# Patient Record
Sex: Female | Born: 1950
Health system: Southern US, Community
[De-identification: ages and names within clinical notes are randomized; demographics above are authoritative.]

## PROBLEM LIST (undated history)

## (undated) DIAGNOSIS — G4730 Sleep apnea, unspecified: Secondary | ICD-10-CM

## (undated) DIAGNOSIS — I739 Peripheral vascular disease, unspecified: Secondary | ICD-10-CM

## (undated) DIAGNOSIS — I10 Essential (primary) hypertension: Secondary | ICD-10-CM

## (undated) DIAGNOSIS — E785 Hyperlipidemia, unspecified: Secondary | ICD-10-CM

## (undated) DIAGNOSIS — Z720 Tobacco use: Secondary | ICD-10-CM

## (undated) DIAGNOSIS — J449 Chronic obstructive pulmonary disease, unspecified: Secondary | ICD-10-CM

## (undated) DIAGNOSIS — I2510 Atherosclerotic heart disease of native coronary artery without angina pectoris: Secondary | ICD-10-CM

## (undated) DIAGNOSIS — J45909 Unspecified asthma, uncomplicated: Secondary | ICD-10-CM

## (undated) DIAGNOSIS — K219 Gastro-esophageal reflux disease without esophagitis: Secondary | ICD-10-CM

## (undated) DIAGNOSIS — T7840XA Allergy, unspecified, initial encounter: Secondary | ICD-10-CM

## (undated) DIAGNOSIS — E119 Type 2 diabetes mellitus without complications: Secondary | ICD-10-CM

## (undated) DIAGNOSIS — I5189 Other ill-defined heart diseases: Secondary | ICD-10-CM

## (undated) DIAGNOSIS — M1990 Unspecified osteoarthritis, unspecified site: Secondary | ICD-10-CM

## (undated) HISTORY — DX: Type 2 diabetes mellitus without complications: E11.9

## (undated) HISTORY — PX: CHOLECYSTECTOMY: SHX55

## (undated) HISTORY — DX: Tobacco use: Z72.0

## (undated) HISTORY — DX: Chronic obstructive pulmonary disease, unspecified: J44.9

## (undated) HISTORY — DX: Unspecified osteoarthritis, unspecified site: M19.90

## (undated) HISTORY — DX: Atherosclerotic heart disease of native coronary artery without angina pectoris: I25.10

## (undated) HISTORY — DX: Peripheral vascular disease, unspecified: I73.9

## (undated) HISTORY — DX: Hyperlipidemia, unspecified: E78.5

## (undated) HISTORY — DX: Sleep apnea, unspecified: G47.30

## (undated) HISTORY — DX: Other ill-defined heart diseases: I51.89

## (undated) HISTORY — DX: Allergy, unspecified, initial encounter: T78.40XA

## (undated) HISTORY — PX: CARDIAC CATHETERIZATION: SHX172

## (undated) HISTORY — DX: Essential (primary) hypertension: I10

## (undated) HISTORY — DX: Gastro-esophageal reflux disease without esophagitis: K21.9

## (undated) HISTORY — DX: Unspecified asthma, uncomplicated: J45.909

---

## 2004-03-10 ENCOUNTER — Other Ambulatory Visit: Payer: Self-pay

## 2004-05-03 ENCOUNTER — Other Ambulatory Visit: Payer: Self-pay

## 2004-11-30 ENCOUNTER — Ambulatory Visit: Payer: Self-pay | Admitting: General Practice

## 2005-02-18 ENCOUNTER — Ambulatory Visit: Payer: Self-pay | Admitting: Obstetrics and Gynecology

## 2005-06-01 ENCOUNTER — Emergency Department: Payer: Self-pay | Admitting: Emergency Medicine

## 2005-06-01 ENCOUNTER — Other Ambulatory Visit: Payer: Self-pay

## 2007-03-15 ENCOUNTER — Ambulatory Visit: Payer: Self-pay

## 2007-06-19 ENCOUNTER — Emergency Department: Payer: Self-pay | Admitting: Emergency Medicine

## 2007-10-08 ENCOUNTER — Emergency Department: Payer: Self-pay | Admitting: Emergency Medicine

## 2008-02-13 ENCOUNTER — Ambulatory Visit: Payer: Self-pay | Admitting: Gastroenterology

## 2008-04-13 ENCOUNTER — Emergency Department: Payer: Self-pay | Admitting: Emergency Medicine

## 2008-05-22 ENCOUNTER — Ambulatory Visit: Payer: Self-pay

## 2008-06-11 ENCOUNTER — Encounter: Payer: Self-pay | Admitting: General Practice

## 2008-06-26 ENCOUNTER — Encounter: Payer: Self-pay | Admitting: General Practice

## 2008-07-27 ENCOUNTER — Encounter: Payer: Self-pay | Admitting: General Practice

## 2008-08-18 ENCOUNTER — Ambulatory Visit: Payer: Self-pay | Admitting: General Practice

## 2008-10-09 ENCOUNTER — Ambulatory Visit: Payer: Self-pay | Admitting: Orthopedic Surgery

## 2008-10-28 ENCOUNTER — Ambulatory Visit: Payer: Self-pay | Admitting: Orthopedic Surgery

## 2008-12-03 ENCOUNTER — Encounter: Payer: Self-pay | Admitting: Orthopedic Surgery

## 2008-12-18 ENCOUNTER — Ambulatory Visit: Payer: Self-pay

## 2008-12-25 ENCOUNTER — Encounter: Payer: Self-pay | Admitting: Orthopedic Surgery

## 2009-02-11 ENCOUNTER — Encounter: Payer: Self-pay | Admitting: Internal Medicine

## 2009-02-11 ENCOUNTER — Ambulatory Visit: Payer: Self-pay

## 2009-04-09 ENCOUNTER — Ambulatory Visit: Payer: Self-pay | Admitting: General Practice

## 2009-04-17 ENCOUNTER — Encounter: Payer: Self-pay | Admitting: General Practice

## 2009-04-25 ENCOUNTER — Inpatient Hospital Stay: Payer: Self-pay | Admitting: Internal Medicine

## 2009-04-26 ENCOUNTER — Encounter: Payer: Self-pay | Admitting: General Practice

## 2009-05-21 ENCOUNTER — Ambulatory Visit: Payer: Self-pay | Admitting: General Practice

## 2009-05-27 ENCOUNTER — Encounter: Payer: Self-pay | Admitting: General Practice

## 2009-06-08 ENCOUNTER — Ambulatory Visit: Payer: Self-pay

## 2009-06-26 ENCOUNTER — Encounter: Payer: Self-pay | Admitting: General Practice

## 2010-07-14 ENCOUNTER — Observation Stay: Payer: Self-pay | Admitting: Internal Medicine

## 2010-08-11 ENCOUNTER — Ambulatory Visit: Payer: Self-pay | Admitting: Family Medicine

## 2010-10-20 ENCOUNTER — Ambulatory Visit: Payer: Self-pay | Admitting: Gastroenterology

## 2010-12-15 ENCOUNTER — Emergency Department: Payer: Self-pay | Admitting: Internal Medicine

## 2011-01-25 ENCOUNTER — Encounter: Payer: Self-pay | Admitting: Physician Assistant

## 2011-01-31 ENCOUNTER — Emergency Department: Payer: Self-pay | Admitting: Internal Medicine

## 2011-02-25 ENCOUNTER — Encounter: Payer: Self-pay | Admitting: Physician Assistant

## 2011-03-14 ENCOUNTER — Ambulatory Visit: Payer: Self-pay | Admitting: Unknown Physician Specialty

## 2011-03-27 ENCOUNTER — Encounter: Payer: Self-pay | Admitting: Physician Assistant

## 2011-04-22 ENCOUNTER — Ambulatory Visit: Payer: Self-pay | Admitting: General Practice

## 2011-05-03 ENCOUNTER — Other Ambulatory Visit: Payer: Self-pay

## 2011-05-23 ENCOUNTER — Ambulatory Visit: Payer: Self-pay | Admitting: Physician Assistant

## 2011-09-07 ENCOUNTER — Ambulatory Visit: Payer: Self-pay | Admitting: General Practice

## 2012-09-13 ENCOUNTER — Ambulatory Visit: Payer: Self-pay | Admitting: Family Medicine

## 2013-01-05 ENCOUNTER — Emergency Department: Payer: Self-pay | Admitting: Emergency Medicine

## 2013-09-16 ENCOUNTER — Ambulatory Visit: Payer: Self-pay | Admitting: Family Medicine

## 2014-03-15 ENCOUNTER — Emergency Department: Payer: Self-pay | Admitting: Emergency Medicine

## 2014-03-15 LAB — BASIC METABOLIC PANEL
Anion Gap: 7 (ref 7–16)
BUN: 11 mg/dL (ref 7–18)
CALCIUM: 8.9 mg/dL (ref 8.5–10.1)
Chloride: 101 mmol/L (ref 98–107)
Co2: 30 mmol/L (ref 21–32)
Creatinine: 1.01 mg/dL (ref 0.60–1.30)
EGFR (Non-African Amer.): >60
Glucose: 137 mg/dL — ABNORMAL HIGH (ref 65–99)
OSMOLALITY: 277 (ref 275–301)
Potassium: 3.6 mmol/L (ref 3.5–5.1)
SODIUM: 138 mmol/L (ref 136–145)

## 2014-03-15 LAB — CBC
HCT: 43.4 % (ref 35.0–47.0)
HGB: 14.5 g/dL (ref 12.0–16.0)
MCH: 30.7 pg (ref 26.0–34.0)
MCHC: 33.5 g/dL (ref 32.0–36.0)
MCV: 92 fL (ref 80–100)
Platelet: 291 10*3/uL (ref 150–440)
RBC: 4.73 10*6/uL (ref 3.80–5.20)
RDW: 13.3 % (ref 11.5–14.5)
WBC: 10.8 10*3/uL (ref 3.6–11.0)

## 2014-03-15 LAB — TROPONIN I

## 2014-04-21 ENCOUNTER — Ambulatory Visit: Payer: Self-pay

## 2014-04-26 ENCOUNTER — Ambulatory Visit: Payer: Self-pay

## 2014-05-27 ENCOUNTER — Ambulatory Visit: Payer: Self-pay

## 2014-06-26 ENCOUNTER — Ambulatory Visit: Payer: Self-pay

## 2014-10-09 ENCOUNTER — Ambulatory Visit: Payer: Self-pay | Admitting: Family Medicine

## 2014-12-15 ENCOUNTER — Other Ambulatory Visit: Payer: Self-pay

## 2014-12-15 NOTE — Patient Outreach (Signed)
Patient in- saw MD earlier this month- A1C slightly higher than last A1C but MD pleased with report of 6.7%  The patient has decided to quit smoking- she contemplated going to a hypnotist but decided on Chantix instead- her MD has given her a prescription and she is going to set a date with her daughter and son, both who live with her and both who smoke and want to quit.  Orilla states she does not eat all meals and she tells me she needs to limit sweets.  She is not willing to increase her activity because she said she walks so much at work.

## 2014-12-17 NOTE — Patient Outreach (Signed)
   12/17/2014   Rodolph Bong 10-25-1950 [id number]    Current Medications: Current Outpatient Prescriptions  Medication Sig Dispense Refill  . albuterol (PROVENTIL HFA;VENTOLIN HFA) 108 (90 BASE) MCG/ACT inhaler Inhale 1 puff into the lungs every 6 (six) hours as needed.    Marland Kitchen aspirin 81 MG tablet Take 81 mg by mouth daily. 1 tablet daily    . atenolol (TENORMIN) 25 MG tablet Take 25 mg by mouth daily. 1 tablet once a day    . atorvastatin (LIPITOR) 40 MG tablet Take 40 mg by mouth daily. 1 tab per day    . fluticasone (FLONASE) 50 MCG/ACT nasal spray Place 1 spray into both nostrils daily. As needed    . Fluticasone-Salmeterol (ADVAIR) 250-50 MCG/DOSE AEPB Inhale 1 puff into the lungs 2 (two) times daily.    Marland Kitchen lisinopril-hydrochlorothiazide (PRINZIDE,ZESTORETIC) 20-12.5 MG per tablet Take 1 tablet by mouth daily.    . metFORMIN (GLUCOPHAGE) 500 MG tablet Take 500 mg by mouth 2 (two) times daily with a meal.    . naproxen (NAPROSYN) 500 MG tablet Take 500 mg by mouth 2 (two) times daily with a meal. As needed    . omeprazole (PRILOSEC) 20 MG capsule Take 20 mg by mouth daily.     No current facility-administered medications for this visit.    Functional Status: No flowsheet data found.  Fall/Depression Screening: PHQ 2/9 Scores 12/15/2014  PHQ - 2 Score 0    Assessment:  Patient in- saw MD earlier this month- A1C slightly higher than last A1C but MD pleased with report of 6.7%  The patient has decided to quit smoking- she contemplated going to a hypnotist but decided on Chantix instead- her MD has given her a prescription and she is going to set a date with her daughter and son, both who live with her and both who smoke and want to quit.  Bert states she does not eat all meals and she tells me she needs to limit sweets. She is not willing to increase her activity because she said she walks so much at work.   I reviewed the meaning of the A1C with the patient using a pictorial  explanation.   Plan: Patient has her prescription for Chantix- will set a quit date with her children who are both going to quit at the same time.

## 2014-12-17 NOTE — Patient Instructions (Addendum)
Hemoglobin A1c Test The hemoglobin (HbA1c) test measures the average amount of sugar (glucose) in your blood during the 2-3 months just before the test (rather than measuring the current amount of glucose in the sample of blood). Some of the glucose that circulates in your blood binds to blood proteins. Hemoglobin (Hb) is a blood protein that carries oxygen in the red blood cells (RBCs). When glucose binds to Hb, the glucose-coated Hb is called glycated Hb. Once Hb is glycated, it remains that way for the life of the RBC (about 120 days). The HbA1c test is used to diagnose diabetes mellitus and to monitor long-term control of blood sugar in people who have diabetes mellitus. The HbA1c test can be used in place of or in combination with fasting blood glucose level and oral glucose tolerance tests. There are several methods for measuring the concentration of HbA1c in the blood. One method uses antibodies that bind specifically to HbA1c. A lab instrument then measures the concentration of bound antibodies in a sample. A second method called ion exchange high-pressure liquid chromatography separates HbA1c from other types of Hb on the basis of differences in electrical charge. A third method for measuring HbA1c uses enzymes that react specifically with the glucose on HbA1c to produce a measurable color change. RESULTS  It is your responsibility to obtain your test results. Ask the lab or department performing the test when and how you will get your results. Contact your health care provider to discuss any questions you have about your results.  Range of Normal Values Ranges for normal values may vary among different labs and hospitals. You should always check with your health care provider after having lab work or other tests done to discuss the meaning of your test results and whether your values are considered within normal limits. The range for normal HbA1c test results is from 4.5% to less than 5.7%. Meaning  of Results Outside Normal Value Ranges Abnormally high HbA1c values are most commonly an indication of prediabetes mellitus and diabetes mellitus:  An HbA1c result of 5.7-6.4% is considered diagnostic of prediabetes mellitus.  An HbA1c result of 6.5% or higher on two separate occasions is considered diagnostic of diabetes mellitus. There are certain conditions that can cause a falsely low HbA1c test result. These conditions include pregnancy, blood loss, blood transfusions, anemia caused by premature destruction of red blood cells (hemolytic anemia), and certain unusual forms of Hb (Hb variants), such as sickle cell trait. There are certain conditions that can cause a falsely low HbA1c test result. These conditions include iron deficiency, kidney failure, and certain Hb variants. Document Released: 10/04/2004 Document Revised: 01/27/2014 Document Reviewed: 08/09/2012 [facility] Patient Information 2015 [place], [place]. This information is not intended to replace advice given to you by your health care provider. Make sure you discuss any questions you have with your health care provider. Blood Glucose Monitoring Monitoring your blood glucose (also know as blood sugar) helps you to manage your diabetes. It also helps you and your health care provider monitor your diabetes and determine how well your treatment plan is working. WHY SHOULD YOU MONITOR YOUR BLOOD GLUCOSE?  It can help you understand how food, exercise, and medicine affect your blood glucose.  It allows you to know what your blood glucose is at any given moment. You can quickly tell if you are having low blood glucose (hypoglycemia) or high blood glucose (hyperglycemia).  It can help you and your health care provider know how to adjust your  medicines.  It can help you understand how to manage an illness or adjust medicine for exercise. WHEN SHOULD YOU TEST? Your health care provider will help you decide how often you should check your  blood glucose. This may depend on the type of diabetes you have, your diabetes control, or the types of medicines you are taking. Be sure to write down all of your blood glucose readings so that this information can be reviewed with your health care provider. See below for examples of testing times that your health care provider may suggest. Type 1 Diabetes  Test 4 times a day if you are in good control, using an insulin pump, or perform multiple daily injections.  If your diabetes is not well controlled or if you are sick, you may need to monitor more often.  It is a good idea to also monitor:  Before and after exercise.  Between meals and 2 hours after a meal.  Occasionally between 2:00 a.m. and 3:00 a.m. Type 2 Diabetes  It can vary with each person, but generally, if you are on insulin, test 4 times a day.  If you take medicines by mouth (orally), test 2 times a day.  If you are on a controlled diet, test once a day.  If your diabetes is not well controlled or if you are sick, you may need to monitor more often. HOW TO MONITOR YOUR BLOOD GLUCOSE Supplies Needed  Blood glucose meter.  Test strips for your meter. Each meter has its own strips. You must use the strips that go with your own meter.  A pricking needle (lancet).  A device that holds the lancet (lancing device).  A journal or log book to write down your results. Procedure  Wash your hands with soap and water. Alcohol is not preferred.  Prick the side of your finger (not the tip) with the lancet.  Gently milk the finger until a small drop of blood appears.  Follow the instructions that come with your meter for inserting the test strip, applying blood to the strip, and using your blood glucose meter. Other Areas to Get Blood for Testing Some meters allow you to use other areas of your body (other than your finger) to test your blood. These areas are called alternative sites. The most common alternative sites  are:  The forearm.  The thigh.  The back area of the lower leg.  The palm of the hand. The blood flow in these areas is slower. Therefore, the blood glucose values you get may be delayed, and the numbers are different from what you would get from your fingers. Do not use alternative sites if you think you are having hypoglycemia. Your reading will not be accurate. Always use a finger if you are having hypoglycemia. Also, if you cannot feel your lows (hypoglycemia unawareness), always use your fingers for your blood glucose checks. ADDITIONAL TIPS FOR GLUCOSE MONITORING  Do not reuse lancets.  Always carry your supplies with you.  All blood glucose meters have a 24-hour "hotline" number to call if you have questions or need help.  Adjust (calibrate) your blood glucose meter with a control solution after finishing a few boxes of strips. BLOOD GLUCOSE RECORD KEEPING It is a good idea to keep a daily record or log of your blood glucose readings. Most glucose meters, if not all, keep your glucose records stored in the meter. Some meters come with the ability to download your records to your home computer. Keeping  a record of your blood glucose readings is especially helpful if you are wanting to look for patterns. Make notes to go along with the blood glucose readings because you might forget what happened at that exact time. Keeping good records helps you and your health care provider to work together to achieve good diabetes management.  Document Released: 09/15/2003 Document Revised: 01/27/2014 Document Reviewed: 02/04/2013 [facility] Patient Information 2015 [place], [place]. This information is not intended to replace advice given to you by your health care provider. Make sure you discuss any questions you have with your health care provider.

## 2014-12-29 ENCOUNTER — Ambulatory Visit: Payer: Self-pay

## 2015-04-01 ENCOUNTER — Other Ambulatory Visit: Payer: Self-pay

## 2015-04-01 NOTE — Patient Outreach (Signed)
[facility]) Care Management  04/01/2015  Monica Oliver January 16, 1951 [id number]   Left a voice message requesting the patient schedule an appointment.   Gentry Fitz, RN, BA, [place], [place] Direct Dial:  [phone]  Fax:  [phone] E-mail: [email or web address] [address], [place], [place]  [address]

## 2015-05-04 ENCOUNTER — Other Ambulatory Visit: Payer: Self-pay

## 2015-05-04 VITALS — BP 130/66 | Ht 64.0 in | Wt 183.6 lb

## 2015-05-04 DIAGNOSIS — E119 Type 2 diabetes mellitus without complications: Secondary | ICD-10-CM

## 2015-05-04 NOTE — Patient Outreach (Signed)
[facility]) Care Management  [place]  05/04/2015   Monica Oliver 1951/03/10 [id number]  Subjective: Patient in for regular Link to Wellness visit.  She happily reports that she has been checking blood sugars more often and she has her meter with her.  Sugars 103-132mg /dl fasting and 105-168mg /dl 2 hour post prandial.  She has lost 6 lbs since I last saw her but she's had an upper respiratory infection that required a nebulizer, steroids and an antibiotic.  She said she lost weight because she lost her appetite.  Complains of no pain currently but that sometimes her great toes on both feet will hurt because of the nails. She verbalizes that she does not exercise because she walks all day at work.   Objective:  Sugars 103-132mg /dl fasting and 105-168mg /dl 2 hour post prandial.   Filed Vitals:   05/04/15 1114  BP: 130/66   On examination- Both great toenails are thick and growing into the skin.  Neither is red or swollen. No complaint of pain at this time.  No abnormal findings on the sole of the feet.   Did not do a point of care A1C at this visit since she recently had steroids- defer to MD visit.   Current Medications:  Current Outpatient Prescriptions  Medication Sig Dispense Refill  . albuterol (PROVENTIL HFA;VENTOLIN HFA) 108 (90 BASE) MCG/ACT inhaler Inhale 1 puff into the lungs every 6 (six) hours as needed.    Marland Kitchen aspirin 81 MG tablet Take 81 mg by mouth daily. 1 tablet daily    . atenolol (TENORMIN) 25 MG tablet Take 25 mg by mouth daily. 1 tablet once a day    . atorvastatin (LIPITOR) 40 MG tablet Take 40 mg by mouth daily. 1 tab per day    . fluticasone (FLONASE) 50 MCG/ACT nasal spray Place 1 spray into both nostrils daily. As needed    . Fluticasone-Salmeterol (ADVAIR) 250-50 MCG/DOSE AEPB Inhale 1 puff into the lungs 2 (two) times daily.    Marland Kitchen lisinopril-hydrochlorothiazide (PRINZIDE,ZESTORETIC) 20-12.5 MG per tablet Take 1 tablet by mouth daily.    .  metFORMIN (GLUCOPHAGE) 500 MG tablet Take 500 mg by mouth daily.     . naproxen (NAPROSYN) 500 MG tablet Take 500 mg by mouth 2 (two) times daily with a meal. As needed    . omeprazole (PRILOSEC) 20 MG capsule Take 20 mg by mouth daily. prn     No current facility-administered medications for this visit.    Functional Status:  In your present state of health, do you have any difficulty performing the following activities: 05/04/2015  Hearing? N  Vision? N  Difficulty concentrating or making decisions? N  Walking or climbing stairs? N  Dressing or bathing? N  Doing errands, shopping? N  Preparing Food and eating ? N  Using the Toilet? N  In the past six months, have you accidently leaked urine? N  Do you have problems with loss of bowel control? N  Managing your Medications? N  Managing your Finances? N  Housekeeping or managing your Housekeeping? N    Fall/Depression Screening: PHQ 2/9 Scores 12/15/2014  PHQ - 2 Score 0    Assessment: Patient continues to smoke despite having Chantix at home- she verbalizes that she wants to but is not ready.  Both her son and daughter live at home and smoke.   She is missing numerous preventative care visits that I have strongly encouraged her to complete; PAP, dental/oral cancer check,  dilated eye, podiatrist, MD visit, A1C.  She is doing a much better job checking her blood sugars than she has in the past.   Plan:  Schedule MD visit to have PAP and A1C Schedule with foot doctor to discuss best options for toe nails.  Continue checking sugars 1-2 times per day.   Gentry Fitz, RN, BA, [place], [place] Direct Dial:  [phone]  Fax:  [phone] E-mail: [email or web address] [address], [place], [place]  [address]

## 2015-05-14 ENCOUNTER — Ambulatory Visit
Admission: RE | Admit: 2015-05-14 | Discharge: 2015-05-14 | Disposition: A | Discharge status: Home / Self Care | Payer: 59 | Source: Ambulatory Visit | Attending: Physician Assistant | Admitting: Physician Assistant

## 2015-05-14 ENCOUNTER — Other Ambulatory Visit: Payer: Self-pay | Admitting: Physician Assistant

## 2015-05-14 DIAGNOSIS — R05 Cough: Secondary | ICD-10-CM | POA: Diagnosis present

## 2015-05-14 DIAGNOSIS — R059 Cough, unspecified: Secondary | ICD-10-CM

## 2015-08-10 ENCOUNTER — Other Ambulatory Visit: Payer: Self-pay

## 2015-08-10 VITALS — BP 128/74 | Ht 64.0 in | Wt 186.0 lb

## 2015-08-10 DIAGNOSIS — E119 Type 2 diabetes mellitus without complications: Secondary | ICD-10-CM

## 2015-08-10 NOTE — Patient Outreach (Addendum)
[facility]) Care Management  [place]  08/10/2015   TIKI BACHRACH 09/14/1951 [id number]  Subjective: Monica Oliver comes today for her regularly scheduled Link to Wellness visit. She complains of being tired but has worked the weekend.  She has no other complaints.  She has seen her family doctor.  She had one episode of asthma exacerbation in August and required steroids but blood sugars recently are ideal; 115mg /dl for 7 days, 123mg /dl for 14 days and 122mg /dl for 30 days- she reports taking her sugars twice a day.  She takes BP and cholesterol medications on a regular basis.  She had been ordered Symbicort and was taking it prn- she has clarified with the pharmacy and should be taking 2 puffs twice a day.  Objective:  Filed Vitals:   08/10/15 0956  BP: 128/74  Height: 1.626 m (5\' 4" )  Weight: 186 lb (84.369 kg)   Review of her blood sugar meter; fasting blood sugars 115-147mg /dl, 2 hour post partum 110-158mg /dl. Reports A1C of 6.6%  Current Medications:  Current Outpatient Prescriptions  Medication Sig Dispense Refill  . albuterol (PROVENTIL HFA;VENTOLIN HFA) 108 (90 BASE) MCG/ACT inhaler Inhale 1 puff into the lungs every 6 (six) hours as needed.    Marland Kitchen aspirin 81 MG tablet Take 81 mg by mouth daily. 1 tablet daily    . atenolol (TENORMIN) 25 MG tablet Take 25 mg by mouth daily. 1 tablet once a day    . atorvastatin (LIPITOR) 40 MG tablet Take 40 mg by mouth daily. 1 tab per day    . budesonide-formoterol (SYMBICORT) 160-4.5 MCG/ACT inhaler Inhale into the lungs 2 (two) times daily.     . fluticasone (FLONASE) 50 MCG/ACT nasal spray Place 1 spray into both nostrils daily. As needed    . lisinopril-hydrochlorothiazide (PRINZIDE,ZESTORETIC) 20-12.5 MG per tablet Take 1 tablet by mouth daily.    . metFORMIN (GLUCOPHAGE) 500 MG tablet Take 500 mg by mouth daily.     . naproxen (NAPROSYN) 500 MG tablet Take 500 mg by mouth 2 (two) times daily with a meal. As needed     . omeprazole (PRILOSEC) 20 MG capsule Take 20 mg by mouth daily. prn    . Fluticasone-Salmeterol (ADVAIR) 250-50 MCG/DOSE AEPB Inhale 1 puff into the lungs 2 (two) times daily.     No current facility-administered medications for this visit.    Functional Status:  In your present state of health, do you have any difficulty performing the following activities: 08/10/2015 05/04/2015  Hearing? N N  Vision? N N  Difficulty concentrating or making decisions? N N  Walking or climbing stairs? N N  Dressing or bathing? N N  Doing errands, shopping? N N  Preparing Food and eating ? - N  Using the Toilet? - N  In the past six months, have you accidently leaked urine? - N  Do you have problems with loss of bowel control? - N  Managing your Medications? - N  Managing your Finances? - N  Housekeeping or managing your Housekeeping? - N    Fall/Depression Screening: PHQ 2/9 Scores 08/10/2015 12/15/2014  PHQ - 2 Score 0 0    Assessment: Aquita is taking an active part in managing her diabetes by checking her blood sugars and taking her meds as ordered.  She is still smoking and does not exercise.   Plan: [place] will continue to check sugars twice a day.  She has thick toe nails which requires her to wear soft toed  shoes- we discussed her seeing a foot doctor which she has not done- it might be beneficial.  She is going to enquire with the [place] and employee health on getting a pneumonia and shingles vaccine.  She had her flu shot in October, 2016.  [facility] CM Care Plan Problem One        Most Recent Value   Care Plan Problem One  Current Smoker   Role Documenting the Problem One  Care Management Coordinator   Care Plan for Problem One  Not Active [patient has not started using Chantix and is not ready to quit smoking.  she verbalizes a desire to quit but is not ready. ]    [facility] CM Care Plan Problem Two        Most Recent Value   Care Plan Problem Two  Potential for long term complications     Role Documenting the Problem Two  Care Management [place] for Problem Two  Active   Interventions for Problem Two Long Term Goal   Ask MD and Employee health about Shingles and Pneumonia vaccine   [facility] Long Term Goal (31-90) days  Patient will understand the immunizations she needs to stay healthy   [facility] Long Term Goal Start Date  08/10/15       Follow up with me in 3 months- she has a follow appointment with her doctor in January, 2017.    Gentry Fitz, RN, BA, [place], [place] Direct Dial:  [phone]  Fax:  [phone] E-mail: [email or web address] [address] [place], [place]  [address]

## 2015-09-14 ENCOUNTER — Ambulatory Visit: Payer: Self-pay | Admitting: Physician Assistant

## 2015-09-14 ENCOUNTER — Encounter: Payer: Self-pay | Admitting: Physician Assistant

## 2015-09-14 VITALS — BP 120/82 | HR 80 | Temp 98.5°F

## 2015-09-14 DIAGNOSIS — J209 Acute bronchitis, unspecified: Secondary | ICD-10-CM

## 2015-09-14 MED ORDER — HYDROCOD POLST-CPM POLST ER 10-8 MG/5ML PO SUER: 5.0000 mL | Freq: Two times a day (BID) | ORAL | Status: DC | PRN

## 2015-09-14 MED ORDER — CEFDINIR 300 MG PO CAPS: 300.0000 mg | ORAL_CAPSULE | Freq: Two times a day (BID) | ORAL | Status: DC

## 2015-09-14 MED ORDER — FLUCONAZOLE 150 MG PO TABS: 150.0000 mg | ORAL_TABLET | Freq: Once | ORAL | Status: DC

## 2015-09-14 MED ORDER — PREDNISONE 10 MG (21) PO TBPK: 10.0000 mg | ORAL_TABLET | Freq: Every day | ORAL | Status: DC

## 2015-09-14 NOTE — Progress Notes (Signed)
S: C/o runny nose and congestion for 3 days, cough is harsh, keeping her awake at night, chest feels tight,  no fever, chills, cp/sob, v/d; mucus was brown + smoker Using otc meds: robitussin  O: PE: vitals wnl, nad, perrl eomi, normocephalic, tms dull, nasal mucosa red and swollen, throat injected, neck supple no lymph, lungs c t a, cv rrr, neuro intact  A:  Acute bronchitis, uri   P: omnicef 300mg  bid x 10d, sterapred ds 6d dose pack, diflucan, tussionex, 128ml nr drink fluids, continue regular meds , use otc meds of choice, return if not improving in 5 days, return earlier if worsening

## 2015-09-29 ENCOUNTER — Ambulatory Visit: Payer: Self-pay | Admitting: Physician Assistant

## 2015-09-29 ENCOUNTER — Encounter: Payer: Self-pay | Admitting: Physician Assistant

## 2015-09-29 VITALS — BP 120/74 | HR 86 | Temp 98.5°F

## 2015-09-29 DIAGNOSIS — J209 Acute bronchitis, unspecified: Secondary | ICD-10-CM

## 2015-09-29 DIAGNOSIS — R300 Dysuria: Secondary | ICD-10-CM

## 2015-09-29 LAB — POCT URINALYSIS DIPSTICK
Bilirubin, UA: NEGATIVE
Blood, UA: NEGATIVE
Glucose, UA: NEGATIVE
Ketones, UA: NEGATIVE
LEUKOCYTES UA: NEGATIVE
Nitrite, UA: NEGATIVE
PH UA: 7
PROTEIN UA: NEGATIVE
SPEC GRAV UA: 1.025
UROBILINOGEN UA: 0.2

## 2015-09-29 MED ORDER — METHYLPREDNISOLONE 4 MG PO TBPK: ORAL_TABLET | ORAL | Status: DC

## 2015-09-29 MED ORDER — LEVOFLOXACIN 500 MG PO TABS: 500.0000 mg | ORAL_TABLET | Freq: Every day | ORAL | Status: DC

## 2015-09-29 NOTE — Progress Notes (Signed)
S: continued cough and congestion, no known fever but is having a lot of sweats and cold chills, some sob but is better since using nebulizer, no swelling in feet/ankles, doesn't think she is urinating as much, ?if dehydrated  O: vitals wnl, nad, pt appears to not feel well, lungs c t a, cv rrr, cough is congested, skin has good turgor, ua wnl, no ketones noted  A: uri  P: levaquin, medrol dose pack, return if worsening

## 2015-11-02 ENCOUNTER — Other Ambulatory Visit: Payer: Self-pay

## 2015-11-02 VITALS — BP 150/90 | Ht 64.0 in | Wt 182.5 lb

## 2015-11-02 DIAGNOSIS — E119 Type 2 diabetes mellitus without complications: Secondary | ICD-10-CM

## 2015-11-02 NOTE — Patient Outreach (Signed)
[place] [facility]) Care Management  [place]  11/02/2015   Monica Oliver March 10, 1951 [id number]  Subjective: Monica Oliver visited today for her [facility] [place] appointment. Despite goals set last visit, she continues only to check blood sugars first thing in the morning- none after meals.  She is not exercising and although she tried to use Chantix to quit smoking, the medication made her nauseous and she quit using it.   She denies pain or depression and is taking meds as ordered.  She reports that her toe nails feel better- she's keeping them short.  She did not see a foot doctor for an assessment of her toe nails- she using Vicks Vapor Rub on her toenails. She had a sinus infection since the last time I saw her - she took prednisone and 2 rounds of antibiotics.   Objective:  Filed Vitals:   11/02/15 0912  BP: 150/90  Height: 1.626 m (5\' 4" )  Weight: 182 lb 8 oz (82.781 kg)     Current Medications:  Current Outpatient Prescriptions  Medication Sig Dispense Refill  . albuterol (PROVENTIL HFA;VENTOLIN HFA) 108 (90 BASE) MCG/ACT inhaler Inhale 1 puff into the lungs every 6 (six) hours as needed.    Marland Kitchen aspirin 81 MG tablet Take 81 mg by mouth daily. 1 tablet daily    . atenolol (TENORMIN) 25 MG tablet Take 25 mg by mouth daily. 1 tablet once a day    . atorvastatin (LIPITOR) 40 MG tablet Take 40 mg by mouth daily. 1 tab per day    . budesonide-formoterol (SYMBICORT) 160-4.5 MCG/ACT inhaler Inhale into the lungs 2 (two) times daily.     . chlorpheniramine-HYDROcodone (TUSSIONEX PENNKINETIC ER) 10-8 MG/5ML SUER Take 5 mLs by mouth every 12 (twelve) hours as needed for cough. 150 mL 0  . fluticasone (FLONASE) 50 MCG/ACT nasal spray Place 1 spray into both nostrils daily. As needed    . lisinopril-hydrochlorothiazide (PRINZIDE,ZESTORETIC) 20-12.5 MG per tablet Take 1 tablet by mouth daily.    . metFORMIN (GLUCOPHAGE) 500 MG tablet Take 500 mg by mouth daily.     .  naproxen (NAPROSYN) 500 MG tablet Take 500 mg by mouth 2 (two) times daily with a meal. As needed    . omeprazole (PRILOSEC) 20 MG capsule Take 20 mg by mouth daily. prn    . cefdinir (OMNICEF) 300 MG capsule Take 1 capsule (300 mg total) by mouth 2 (two) times daily. (Patient not taking: Reported on 09/29/2015) 20 capsule 0  . fluconazole (DIFLUCAN) 150 MG tablet Take 1 tablet (150 mg total) by mouth once. (Patient not taking: Reported on 09/29/2015) 1 tablet 0  . Fluticasone-Salmeterol (ADVAIR) 250-50 MCG/DOSE AEPB Inhale 1 puff into the lungs 2 (two) times daily. Reported on 11/02/2015    . levofloxacin (LEVAQUIN) 500 MG tablet Take 1 tablet (500 mg total) by mouth daily. (Patient not taking: Reported on 11/02/2015) 7 tablet 0  . methylPREDNISolone (MEDROL DOSEPAK) 4 MG TBPK tablet Take 6 pills on day one then decrease by 1 pill each day (Patient not taking: Reported on 11/02/2015) 21 tablet 0  . predniSONE (STERAPRED UNI-PAK 21 TAB) 10 MG (21) TBPK tablet Take 1 tablet (10 mg total) by mouth daily. Take 6 pills on day one then decrease by 1 pill each day (Patient not taking: Reported on 09/29/2015) 21 tablet 0   No current facility-administered medications for this visit.    Functional Status:  In your present state of health, do you  have any difficulty performing the following activities: 11/02/2015 08/10/2015  Hearing? N N  Vision? N N  Difficulty concentrating or making decisions? N N  Walking or climbing stairs? N N  Dressing or bathing? N N  Doing errands, shopping? N N  Preparing Food and eating ? - -  Using the Toilet? - -  In the past six months, have you accidently leaked urine? - -  Do you have problems with loss of bowel control? - -  Managing your Medications? - -  Managing your Finances? - -  Housekeeping or managing your Housekeeping? - -    Fall/Depression Screening: PHQ 2/9 Scores 11/02/2015 08/10/2015 12/15/2014  PHQ - 2 Score 0 0 0    Assessment: Monica Oliver expressed a desire to  stop smoking.  This morning's elevated BP may have been related to a cigarette this morning before our appointment. We spoke to the pharmacist and she received Nicorette lozenges to help her quit smoking. She tells me she beats herself up everyday about smoking- her daughter lives with her and smokes too- it will be difficult for Monica Oliver to quit if Monica Oliver is still smoking.   We discussed HCPOA and I have encouraged her to complete the paperwork- she is legally still married and her "husband" lives in [place]. Monica Oliver needs some preventative health maintenance- dilated eye, A1C, and possibly the shingles and pneumonia vaccine- I have asked her to complete these before February 25, 2016 at which time I will follow up with her.    She is not interested in setting exercise goals.   Plan:  [facility] CM Care Plan Problem Two        Most Recent Value   Interventions for Problem Two Long Term Goal   Get a dilated eye exam by February 25, 2016,  Check BP everyday until you see MD- write it down and bring it to him      Gentry Fitz, RN, BA, [place], [place]:  [phone]  Fax:  [phone] E-mail: [email or web address] [address], [place], [place]  [address]

## 2015-12-14 DIAGNOSIS — F17210 Nicotine dependence, cigarettes, uncomplicated: Secondary | ICD-10-CM | POA: Diagnosis not present

## 2015-12-14 DIAGNOSIS — E119 Type 2 diabetes mellitus without complications: Secondary | ICD-10-CM | POA: Diagnosis not present

## 2015-12-14 DIAGNOSIS — R634 Abnormal weight loss: Secondary | ICD-10-CM | POA: Diagnosis not present

## 2015-12-14 DIAGNOSIS — R5383 Other fatigue: Secondary | ICD-10-CM | POA: Diagnosis not present

## 2015-12-14 DIAGNOSIS — J449 Chronic obstructive pulmonary disease, unspecified: Secondary | ICD-10-CM | POA: Diagnosis not present

## 2015-12-14 DIAGNOSIS — R3915 Urgency of urination: Secondary | ICD-10-CM | POA: Diagnosis not present

## 2015-12-14 DIAGNOSIS — G4730 Sleep apnea, unspecified: Secondary | ICD-10-CM | POA: Diagnosis not present

## 2015-12-14 DIAGNOSIS — E559 Vitamin D deficiency, unspecified: Secondary | ICD-10-CM | POA: Diagnosis not present

## 2015-12-14 DIAGNOSIS — R7309 Other abnormal glucose: Secondary | ICD-10-CM | POA: Diagnosis not present

## 2015-12-14 DIAGNOSIS — I10 Essential (primary) hypertension: Secondary | ICD-10-CM | POA: Diagnosis not present

## 2015-12-22 ENCOUNTER — Encounter: Payer: Self-pay | Admitting: Physician Assistant

## 2015-12-22 ENCOUNTER — Ambulatory Visit: Payer: Self-pay | Admitting: Physician Assistant

## 2015-12-22 VITALS — Temp 98.6°F

## 2015-12-22 LAB — POCT INFLUENZA A/B
INFLUENZA A, POC: NEGATIVE
INFLUENZA B, POC: POSITIVE — AB

## 2015-12-22 MED ORDER — OSELTAMIVIR PHOSPHATE 75 MG PO CAPS
75.0000 mg | ORAL_CAPSULE | Freq: Two times a day (BID) | ORAL | Status: DC
Start: 2015-12-22 — End: 2016-03-03

## 2015-12-22 NOTE — Progress Notes (Signed)
S: C/o runny nose and congestion with dry cough for 1 days, + fever, chills, denies cp/sob, v/d;  Using otc meds:   O: PE: vitals wnl, nad,  Pt appears to not feel well, perrl eomi, normocephalic, tms dull, nasal mucosa red and swollen, throat injected, neck supple no lymph, lungs c t a, cv rrr, neuro intact, flu swab +B  A:  Acute influenza B  P: tamiflu 75mg  bid, drink fluids, continue regular meds , use otc meds of choice, return if not improving in 5 days, return earlier if worsening

## 2015-12-28 ENCOUNTER — Encounter: Payer: Self-pay | Admitting: Physician Assistant

## 2015-12-28 ENCOUNTER — Ambulatory Visit: Payer: Self-pay | Admitting: Physician Assistant

## 2015-12-28 VITALS — BP 130/70 | HR 76 | Temp 98.6°F

## 2015-12-28 DIAGNOSIS — Z0000 Encounter for general adult medical examination without abnormal findings: Secondary | ICD-10-CM

## 2015-12-28 NOTE — Progress Notes (Signed)
S: here for recheck so can go back to work, states she still has a cough which is worse at night, denies fever/chills/cp/sob, states she is feeling much better  O: vitals wnl, nad, pt appears well and much improved from previous visit, lungs c t a, cv rrr  A: resolved influenza  P: may return to work

## 2016-03-03 ENCOUNTER — Encounter: Payer: Self-pay | Admitting: Physician Assistant

## 2016-03-03 ENCOUNTER — Ambulatory Visit: Payer: Self-pay | Admitting: Physician Assistant

## 2016-03-03 VITALS — BP 130/70 | HR 76 | Temp 98.7°F

## 2016-03-03 DIAGNOSIS — J209 Acute bronchitis, unspecified: Secondary | ICD-10-CM

## 2016-03-03 DIAGNOSIS — J018 Other acute sinusitis: Secondary | ICD-10-CM

## 2016-03-03 MED ORDER — CEFDINIR 300 MG PO CAPS: 300.0000 mg | ORAL_CAPSULE | Freq: Two times a day (BID) | ORAL | Status: DC

## 2016-03-03 MED ORDER — METHYLPREDNISOLONE 4 MG PO TBPK: ORAL_TABLET | ORAL | Status: DC

## 2016-03-03 MED ORDER — NAPROXEN 500 MG PO TABS: 500.0000 mg | ORAL_TABLET | Freq: Two times a day (BID) | ORAL | Status: DC

## 2016-03-03 MED ORDER — FLUTICASONE PROPIONATE 50 MCG/ACT NA SUSP: 1.0000 | Freq: Every day | NASAL | Status: DC

## 2016-03-03 NOTE — Progress Notes (Signed)
S: C/o runny nose and congestion for 5 days with dry cough, no fever, chills, cp/sob, v/d; mucus is green and thick, cough is sporadic, c/o of facial and dental pain. Some wheezing  Using otc meds:   O: PE: vitals wnl, nad, perrl eomi, normocephalic, tms dull, nasal mucosa red and swollen, throat injected, neck supple no lymph, lungs c t a, cv rrr, neuro intact, cough is congested  A:  Acute sinusitis   P: drink fluids, continue regular meds , use otc meds of choice, return if not improving in 5 days, return earlier if worsening , omnicef, medrol dose pack, refills for naprosyn and flonase

## 2016-03-07 ENCOUNTER — Other Ambulatory Visit: Payer: Self-pay

## 2016-03-07 VITALS — BP 130/62 | HR 87 | Ht 64.0 in | Wt 188.7 lb

## 2016-03-07 DIAGNOSIS — E119 Type 2 diabetes mellitus without complications: Secondary | ICD-10-CM

## 2016-03-07 NOTE — Patient Outreach (Signed)
[place] [facility]) Care Management  [place]  03/07/2016   Monica Oliver August 04, 1951 [id number]  Subjective: Monica Oliver was in today for her routine scheduled [facility] visit.  She has her meter and although she tells me she's checking her blood sugar daily, she's checking it sporatically- checking a few days in a row then missing long periods between checking.  Her 7 day fasting blood sugar is 118mg /dl, her 14 day was 113mg /dl.  She tells me she has been eating a lot of snacks, often times chips.  She reports eating only 2 meals per day. She reports having had the flu and a recent upper respiratory infection requiring antibiotics.   Objective:  Filed Vitals:   03/07/16 0919  BP: 130/62  Pulse: 87  Height: 1.626 m (5\' 4" )  Weight: 188 lb 11.2 oz (85.594 kg)  SpO2: 97%   Last weight on 11/02/15 was 182.5lbs  Encounter Medications:  Outpatient Encounter Prescriptions as of 03/07/2016  Medication Sig Note  . albuterol (PROVENTIL HFA;VENTOLIN HFA) 108 (90 BASE) MCG/ACT inhaler Inhale 1 puff into the lungs every 6 (six) hours as needed.   Marland Kitchen aspirin 81 MG tablet Take 81 mg by mouth daily. 1 tablet daily   . atenolol (TENORMIN) 25 MG tablet Take 25 mg by mouth daily. 1 tablet once a day   . atorvastatin (LIPITOR) 40 MG tablet Take 40 mg by mouth daily. 1 tab per day   . budesonide-formoterol (SYMBICORT) 160-4.5 MCG/ACT inhaler Inhale into the lungs 2 (two) times daily.    . cefdinir (OMNICEF) 300 MG capsule Take 1 capsule (300 mg total) by mouth 2 (two) times daily.   . fluticasone (FLONASE) 50 MCG/ACT nasal spray Place 1 spray into both nostrils daily. As needed   . lisinopril-hydrochlorothiazide (PRINZIDE,ZESTORETIC) 20-12.5 MG per tablet Take 1 tablet by mouth daily.   . metFORMIN (GLUCOPHAGE) 500 MG tablet Take 500 mg by mouth daily.    . methylPREDNISolone (MEDROL DOSEPAK) 4 MG TBPK tablet Take 6 pills on day one then decrease by 1 pill each day   . naproxen  (NAPROSYN) 500 MG tablet Take 1 tablet (500 mg total) by mouth 2 (two) times daily with a meal. Reported on 12/28/2015   . omeprazole (PRILOSEC) 20 MG capsule Take 20 mg by mouth daily. prn   . TRUE METRIX BLOOD GLUCOSE TEST test strip  03/03/2016: Received from: External Pharmacy  . Fluticasone-Salmeterol (ADVAIR) 250-50 MCG/DOSE AEPB Inhale 1 puff into the lungs 2 (two) times daily. Reported on 03/07/2016    No facility-administered encounter medications on file as of 03/07/2016.    Functional Status:  In your present state of health, do you have any difficulty performing the following activities: 03/07/2016 11/02/2015  Hearing? N N  Vision? N N  Difficulty concentrating or making decisions? N N  Walking or climbing stairs? N N  Dressing or bathing? N N  Doing errands, shopping? N N  Preparing Food and eating ? - -  Using the Toilet? - -  In the past six months, have you accidently leaked urine? - -  Do you have problems with loss of bowel control? - -  Managing your Medications? - -  Managing your Finances? - -  Housekeeping or managing your Housekeeping? - -    Fall/Depression Screening: PHQ 2/9 Scores 03/07/2016 11/02/2015 08/10/2015 12/15/2014  PHQ - 2 Score 0 0 0 0    Assessment:  Patient 's meals follow the ADA guidelines but she is  eating far too many snacks. I would like her to eat a package of peanut butter crackers on the way to work- she goes a long period of time in the morning without eating.   She has not gone to get a dilated eye exam, something we've talked about needing for over a year- she tells me she keeps forgetting to call to schedule. She continues to smoke despite having the Nicotine patch prescription.  Her daughter (at home) smokes as well. Krina is not exercising.   Plan:  [facility] CM Care Plan Problem One        Most Recent Value   Care Plan Problem One  potential for longe term complications   Role Documenting the Problem One  Care Management Coordinator   Care Plan  for Problem One  Active   [facility] Long Term Goal (31-90 days)  Patient will schedule preventative health appointments by our next visit on 06/06/16   [facility] Long Term Goal Start Date  03/07/16   Interventions for Problem One Long Term Goal  1. Schedule dilated eye exam 2. schedule MD visit -ask about dpneumonia vaccine, shingles vaccine and A1C     I will follow up with her in 3 months.    Please encourage this patient to use the Nicotine patch- she tells me she's ready to quit.  Please review the need for preventative vaccines.  Thank you,   Gentry Fitz, RN, BA, [place], [place]:  [phone]  Fax:  [phone] E-mail: [email or web address] [address] [place], [place]  [address]

## 2016-03-25 ENCOUNTER — Ambulatory Visit: Payer: Self-pay | Admitting: Physician Assistant

## 2016-03-30 ENCOUNTER — Encounter: Payer: Self-pay | Admitting: Physician Assistant

## 2016-03-30 ENCOUNTER — Ambulatory Visit: Payer: Self-pay | Admitting: Physician Assistant

## 2016-03-30 VITALS — BP 134/80 | HR 78

## 2016-03-30 DIAGNOSIS — J4520 Mild intermittent asthma, uncomplicated: Secondary | ICD-10-CM

## 2016-03-30 NOTE — Progress Notes (Signed)
S: pt here today to see if we can refer her to pulmonology, states she feels like she has a chronic cough, is on 2 inhalers, still feels that chest is tight, finished the steroid and antibiotic she was previously given, says we discussed she needs to see pulmonology, + smoker  O: vitals wnl, nad, lungs c t a, cv rrr  A: asthma like sx  P: refer to pulmonology for eval, ?copd

## 2016-04-14 ENCOUNTER — Other Ambulatory Visit: Payer: Self-pay | Admitting: Family Medicine

## 2016-04-14 DIAGNOSIS — R5383 Other fatigue: Secondary | ICD-10-CM | POA: Diagnosis not present

## 2016-04-14 DIAGNOSIS — Z1231 Encounter for screening mammogram for malignant neoplasm of breast: Secondary | ICD-10-CM

## 2016-04-14 DIAGNOSIS — I10 Essential (primary) hypertension: Secondary | ICD-10-CM | POA: Diagnosis not present

## 2016-04-14 DIAGNOSIS — Z130 Encounter for screening for diseases of the blood and blood-forming organs and certain disorders involving the immune mechanism: Secondary | ICD-10-CM | POA: Diagnosis not present

## 2016-04-14 DIAGNOSIS — G4730 Sleep apnea, unspecified: Secondary | ICD-10-CM | POA: Diagnosis not present

## 2016-04-14 DIAGNOSIS — R7309 Other abnormal glucose: Secondary | ICD-10-CM | POA: Diagnosis not present

## 2016-04-14 DIAGNOSIS — E119 Type 2 diabetes mellitus without complications: Secondary | ICD-10-CM | POA: Diagnosis not present

## 2016-04-14 DIAGNOSIS — F17210 Nicotine dependence, cigarettes, uncomplicated: Secondary | ICD-10-CM | POA: Diagnosis not present

## 2016-04-14 DIAGNOSIS — J449 Chronic obstructive pulmonary disease, unspecified: Secondary | ICD-10-CM | POA: Diagnosis not present

## 2016-04-18 ENCOUNTER — Ambulatory Visit (INDEPENDENT_AMBULATORY_CARE_PROVIDER_SITE_OTHER): Payer: 59 | Admitting: Internal Medicine

## 2016-04-18 ENCOUNTER — Other Ambulatory Visit: Payer: Self-pay

## 2016-04-18 ENCOUNTER — Telehealth: Payer: Self-pay | Admitting: Internal Medicine

## 2016-04-18 ENCOUNTER — Encounter: Payer: Self-pay | Admitting: Internal Medicine

## 2016-04-18 DIAGNOSIS — Z716 Tobacco abuse counseling: Secondary | ICD-10-CM

## 2016-04-18 DIAGNOSIS — Z9989 Dependence on other enabling machines and devices: Secondary | ICD-10-CM

## 2016-04-18 DIAGNOSIS — G4733 Obstructive sleep apnea (adult) (pediatric): Secondary | ICD-10-CM

## 2016-04-18 DIAGNOSIS — J449 Chronic obstructive pulmonary disease, unspecified: Secondary | ICD-10-CM

## 2016-04-18 DIAGNOSIS — R0609 Other forms of dyspnea: Secondary | ICD-10-CM | POA: Insufficient documentation

## 2016-04-18 DIAGNOSIS — R0600 Dyspnea, unspecified: Secondary | ICD-10-CM | POA: Insufficient documentation

## 2016-04-18 DIAGNOSIS — Z720 Tobacco use: Secondary | ICD-10-CM | POA: Diagnosis not present

## 2016-04-18 DIAGNOSIS — F17200 Nicotine dependence, unspecified, uncomplicated: Secondary | ICD-10-CM

## 2016-04-18 MED ORDER — UMECLIDINIUM BROMIDE 62.5 MCG/INH IN AEPB: 1.0000 | INHALATION_SPRAY | Freq: Every day | RESPIRATORY_TRACT | 0 refills | Status: AC

## 2016-04-18 NOTE — Assessment & Plan Note (Signed)
Tobacco Cessation - Counseling regarding benefits of smoking cessation strategies was provided for more than 12 min. - Educated that at this time smoking- cessation represents the single most important step that patient can take to enhance the length and quality of live. - Educated patient regarding alternatives of behavior interventions, pharmacotherapy including NRT and non-nicotine therapy such, and combinations of both. - Patient at this time: is trying to quit on her own, she will attempt to be down by 50% of her current use at the next visit

## 2016-04-18 NOTE — Assessment & Plan Note (Signed)
Patient with known history of OSA, early on CPAP. Had a sleep study done 5 years ago, does not have access to the results.  Plan: Obtain copy of sleep results from sleepmed Referral to a [facility] for DME supplies

## 2016-04-18 NOTE — Progress Notes (Addendum)
[facility] Consultation    Date: 04/18/2016  MRN# [id number] Monica Oliver 1950-11-21  Referring Physician: Dr. Caryn Section PMD - Dr. Tawni Carnes Tomasetti is a 65 y.o. old female seen in consultation for COPD, asthma optimization  CC:  Chief Complaint  Patient presents with  . Advice Only    referred by Dr. Caryn Section for asthma- wants to establish care with pulmonology for this.  Pt c/o sob with exertion, prod cough with clear mucus, occasional wheezing and chest tightness with exertion.      HPI:  She presents today as a consultation for COPD/asthma optimization. She's had at least 2 upper respiratory tract infections for this year, this is been occurring for at least the last 2-3 years. Her most recent URI was treated with antibiotics and steroids. She states that she gets much relief with steroid use. She endorses a morning smoker's cough with mild sputum production. She is a current smoker, half pack per day. She lives at home with her daughter who is also a smoker. 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 She's been smoking for the past 30 years. 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 She's currently on Symbicort/albuterol. States that she uses albuterol at least 4-5 times per week over the last 6-7 months. Patient notices her symptoms are worse in the spring and with rapid weather changes. Patient has strength to quit smoking in the past, did use Chantix and quit for about 3 months, but started back smoking due to emotional stress. Chantix does cause nausea at times. She does have a mother that had a history of lung cancer. She does endorse dyspnea on exertion, can walk about 25 yards before stopping for a break, and can only climb 1 flight of stairs. Patient does have a history of sleep apnea, currently on CPAP, states that she uses it every night. Could not recall who her DME is, we would like recommendation for a new DME. She also inquired about  lung cancer screening today.   PMHX:   Past Medical History:  Diagnosis Date  . Arthritis   . COPD (chronic obstructive pulmonary disease) ([place])   . Diabetes mellitus without complication ([place])   . GERD (gastroesophageal reflux disease)   . Hyperlipidemia   . Hypertension   . Sleep apnea    C-Pap machine   Surgical Hx:  Past Surgical History:  Procedure Laterality Date  . CESAREAN SECTION     1986  . CHOLECYSTECTOMY     1993   Family Hx:  Family History  Problem Relation Age of Onset  . Hypertension Mother   . Cancer Mother     lung  . Cancer Father     prostate  . Hyperlipidemia Father   . Heart disease Father   . Cancer Brother     prostate  . Cirrhosis Sister   . Hepatitis Sister    Social Hx:   Social History  Substance Use Topics  . Smoking status: Current Every Day Smoker    Packs/day: 0.50    Years: 32.00    Types: Cigarettes    Start date: 12/15/1971  . Smokeless tobacco: Never Used     Comment: feb.2017- tried Chantix- made her nauseous. Has the nicotine patch- not started  . Alcohol use No   Medication:   Current Outpatient Rx  . Order #: [id number] Class: Historical Med  . Order #: [id number] Class: Historical Med  . Order #: [id number] Class: Historical Med  .  Order #: [id number] Class: Historical Med  . Order #: [id number] Class: Historical Med  . Order #: [id number] Class: Normal  . Order #: [id number] Class: Historical Med  . Order #: [id number] Class: Historical Med  . Order #: [id number] Class: Normal  . Order #: [id number] Class: Historical Med  . Order #: [id number] Class: Historical Med  . Order #: [id number] Class: Sample      Allergies:  Penicillins  Review of Systems  Constitutional: Negative for fever and malaise/fatigue.  HENT: Negative for hearing loss.   Eyes: Negative for blurred vision.  Respiratory: Positive for cough, sputum production and shortness of breath. Negative for hemoptysis and wheezing.   Cardiovascular: Negative for chest  pain and palpitations.  Gastrointestinal: Positive for nausea.  Genitourinary: Negative for dysuria.  Musculoskeletal: Negative for myalgias.  Skin: Negative for rash.  Neurological: Negative for dizziness, weakness and headaches.  Endo/Heme/Allergies: Does not bruise/bleed easily.  Psychiatric/Behavioral: Negative for depression.     Physical Examination:   VS: BP 134/72 (BP Location: Left Arm, Cuff Size: Normal)   Pulse 82   Ht 5\' 3"  (1.6 m)   Wt 189 lb (85.7 kg)   SpO2 95%   BMI 33.48 kg/m   General Appearance: No distress  Neuro:without focal findings, mental status, speech normal, alert and oriented, cranial nerves 2-12 intact, reflexes normal and symmetric, sensation grossly normal  HEENT: PERRLA, EOM intact, no ptosis, no other lesions noticed; Mallampati 3 Pulmonary: normal breath sounds., diaphragmatic excursion normal.No wheezing, No rales;   Sputum Production:  none CardiovascularNormal S1,S2.  No m/r/g.  Abdominal aorta pulsation normal.    Abdomen: Benign, Soft, non-tender, No masses, hepatosplenomegaly, No lymphadenopathy Renal:  No costovertebral tenderness  GU:  No performed at this time. Endoc: No evident thyromegaly, no signs of acromegaly or Cushing features Skin:   warm, no rashes, no ecchymosis  Extremities: normal, no cyanosis, clubbing, no edema, warm with normal capillary refill. Other findings:none   Labs results:   Rad results: (The following images and results were reviewed by Dr. Stevenson Clinch on 04/18/2016). CXR 05/14/15 EXAM: CHEST  2 VIEW  COMPARISON:  07/14/2010.  FINDINGS: The heart size and mediastinal contours are within normal limits. Both lungs are clear. The visualized skeletal structures are unremarkable.  IMPRESSION: No active cardiopulmonary disease.    Assessment and Plan:CT revealed current tobacco user, seen for optimization of asthma/COPD. COPD (chronic obstructive pulmonary disease) ([place]) Patient is a current smoker, I do  believe she more has COPD based on her symptoms. She's had at least 23 URIs in the past 2-3 years consecutively, she is probably more of a chronic bronchitis-type. Further evaluation with pulmonary function testing and 6 minute walk test. Optimization of her breathing today with addition of an anticholinergic such as incurse Overall, cessation of tobacco would be paramount to her improving respiratory function. Today I have also explained the difference between maintenance and rescue inhalers along with proper usage, administration and side effects.   Plan: -Pulmonary function tests in 6 to walk test prior to follow-up visit -into the recurrent inhalers, Symbicort, as needed albuterol - Incruse trial - 2 weeks  DOE (dyspnea on exertion) Multifactorial: Possible COPD/asthma, deconditioning, obesity  Plan: Tobacco avoidance COPD/asthma optimization Diet, exercise, weight loss  Tobacco abuse counseling Tobacco Cessation - Counseling regarding benefits of smoking cessation strategies was provided for more than 12 min. - Educated that at this time smoking- cessation represents the single most important step that patient can take to enhance the length and quality of live. - Educated patient regarding alternatives  of behavior interventions, pharmacotherapy including NRT and non-nicotine therapy such, and combinations of both. - Patient at this time: is trying to quit on her own, she will attempt to be down by 50% of her current use at the next visit    Smoker Current smoker Half pack per day 30 years First-degree relative, mother, with lung cancer  Plan: Tobacco avoidance cessation counseling given to the patient We will set up with [place] for lung cancer screening  OSA on CPAP Patient with known history of OSA, early on CPAP. Had a sleep study done 5 years ago, does not have access to the results.  Plan: Obtain copy of sleep results from [facility]med Referral to a [facility]  for DME supplies Patient with significant benefit from OSA control and use of CPAP machine.   Updated Medication List Outpatient Encounter Prescriptions as of 04/18/2016  Medication Sig  . albuterol (PROVENTIL HFA;VENTOLIN HFA) 108 (90 BASE) MCG/ACT inhaler Inhale 1 puff into the lungs every 6 (six) hours as needed.  Marland Kitchen aspirin 81 MG tablet Take 81 mg by mouth daily. 1 tablet daily  . atenolol (TENORMIN) 25 MG tablet Take 25 mg by mouth daily. 1 tablet once a day  . atorvastatin (LIPITOR) 40 MG tablet Take 40 mg by mouth daily. 1 tab per day  . budesonide-formoterol (SYMBICORT) 160-4.5 MCG/ACT inhaler Inhale into the lungs 2 (two) times daily.   . fluticasone (FLONASE) 50 MCG/ACT nasal spray Place 1 spray into both nostrils daily. As needed  . lisinopril-hydrochlorothiazide (PRINZIDE,ZESTORETIC) 20-12.5 MG per tablet Take 1 tablet by mouth daily.  . metFORMIN (GLUCOPHAGE) 500 MG tablet Take 500 mg by mouth daily.   . naproxen (NAPROSYN) 500 MG tablet Take 1 tablet (500 mg total) by mouth 2 (two) times daily with a meal. Reported on 12/28/2015  . omeprazole (PRILOSEC) 20 MG capsule Take 20 mg by mouth daily. prn  . TRUE METRIX BLOOD GLUCOSE TEST test strip   . umeclidinium bromide (INCRUSE ELLIPTA) 62.5 MCG/INH AEPB Inhale 1 puff into the lungs daily.  . [DISCONTINUED] Fluticasone-Salmeterol (ADVAIR) 250-50 MCG/DOSE AEPB Inhale 1 puff into the lungs 2 (two) times daily. Reported on 03/30/2016   No facility-administered encounter medications on file as of 04/18/2016.     Orders for this visit: Orders Placed This Encounter  Procedures  . Pulmonary function test    Standing Status:   Future    Standing Expiration Date:   04/18/2017    Order Specific Question:   Where should this test be performed?    Answer:   [facility]    Order Specific Question:   Full PFT: includes the following: basic spirometry, spirometry pre & post bronchodilator, diffusion capacity (DLCO), lung volumes    Answer:    Full PFT  . 6 minute walk    Standing Status:   Future    Standing Expiration Date:   04/18/2017    Order Specific Question:   Where should this test be performed?    Answer:   Other     Thank  you for the consultation and for allowing [facility] to assist in the care of your patient. Our recommendations are noted above.  Please contact us if we can be of further service.   Vilinda Boehringer, MD  Pulmonary and Critical Care Office Number: [phone]  Note: This note was prepared with Dragon dictation along with smaller phrase technology. Any transcriptional errors that result from this process are unintentional.

## 2016-04-18 NOTE — Patient Instructions (Signed)
Follow up with Dr. Stevenson Clinch in:2 months - PFTs and 6 mwt - cut back on tobacco use - down to 5 cigs/day by next visit - cont with current inhalers - we will give you a 2 week sample of incruse -1 puff daily daily, gargle and rinse after each use, call us for a prescription if you start having improvement in your breathing.  - cont with nightly cpap use - obtain a copy of your sleep study results from [facility]. - we will make a referral to [facility], to manage your DME supplies for CPAP.

## 2016-04-18 NOTE — Progress Notes (Signed)
Patient ID: Monica Oliver, female   DOB: [birth year], 65 y.o.   MRN: [id number] Patient seen in the office today and instructed on use of incruse ellipta.  Patient expressed understanding and demonstrated technique.

## 2016-04-18 NOTE — Telephone Encounter (Signed)
Pt states she would like to get her cpap supplies with [facility]. Order placed. Nothing further needed.

## 2016-04-18 NOTE — Assessment & Plan Note (Signed)
Current smoker Half pack per day 30 years First-degree relative, mother, with lung cancer  Plan: Tobacco avoidance cessation counseling given to the patient We will set up with [facility] for lung cancer screening

## 2016-04-18 NOTE — Assessment & Plan Note (Signed)
Multifactorial: Possible COPD/asthma, deconditioning, obesity  Plan: Tobacco avoidance COPD/asthma optimization Diet, exercise, weight loss

## 2016-04-18 NOTE — Assessment & Plan Note (Signed)
Patient is a current smoker, I do believe she more has COPD based on her symptoms. She's had at least 23 URIs in the past 2-3 years consecutively, she is probably more of a chronic bronchitis-type. Further evaluation with pulmonary function testing and 6 minute walk test. Optimization of her breathing today with addition of an anticholinergic such as incurse Overall, cessation of tobacco would be paramount to her improving respiratory function. Today I have also explained the difference between maintenance and rescue inhalers along with proper usage, administration and side effects.   Plan: -Pulmonary function tests in 6 to walk test prior to follow-up visit -into the recurrent inhalers, Symbicort, as needed albuterol - Incruse trial - 2 weeks

## 2016-04-18 NOTE — Telephone Encounter (Signed)
Pt has a question regarding her sleep equipment. Please call.

## 2016-04-18 NOTE — Addendum Note (Signed)
Addended by: Maryanna Shape A on: 04/18/2016 01:29 PM   Modules accepted: Orders

## 2016-04-28 ENCOUNTER — Telehealth: Payer: Self-pay | Admitting: Internal Medicine

## 2016-04-28 MED ORDER — UMECLIDINIUM BROMIDE 62.5 MCG/INH IN AEPB: 1.0000 | INHALATION_SPRAY | Freq: Every day | RESPIRATORY_TRACT | 5 refills | Status: DC

## 2016-04-28 NOTE — Telephone Encounter (Signed)
Pt calling asking if we can send a refill in for the samples Incruse that she was given Would like [place] to send it to our [place] Please call back.

## 2016-04-28 NOTE — Telephone Encounter (Signed)
Sent Incruse to pt's pharmacy, [facility] Tried to call pt to inform her but no answer and no VM available.

## 2016-05-05 ENCOUNTER — Telehealth: Payer: Self-pay | Admitting: Internal Medicine

## 2016-05-05 DIAGNOSIS — G4733 Obstructive sleep apnea (adult) (pediatric): Secondary | ICD-10-CM

## 2016-05-05 DIAGNOSIS — Z9989 Dependence on other enabling machines and devices: Principal | ICD-10-CM

## 2016-05-05 NOTE — Telephone Encounter (Signed)
Pt calling needing a replaced for her CPAP machine Please advise  [facility]  Please advise.

## 2016-05-05 NOTE — Telephone Encounter (Signed)
Tried to call pt to confirm what she is needing for her CPAP. No answer and no VM has been setup.

## 2016-05-06 NOTE — Telephone Encounter (Signed)
Tried to call pt but no VM available and no answer.

## 2016-05-09 DIAGNOSIS — G4733 Obstructive sleep apnea (adult) (pediatric): Secondary | ICD-10-CM | POA: Diagnosis not present

## 2016-05-09 NOTE — Telephone Encounter (Signed)
Called and spoke to pt. Pt is requesting a replacement CPAP machine as her current machine is over 64 years old. Pt's DME is [facility]. Pt last seen on 04/18/2016.  Dr. Stevenson Clinch please advise. Thanks.

## 2016-05-09 NOTE — Telephone Encounter (Signed)
Patient will need to Discuss new machine with DME. We will be happy to write for a new rx if needed by DME.    Thanks VM

## 2016-05-09 NOTE — Telephone Encounter (Signed)
Called [facility] and was given number for [place] branch to get pt's sleep study from 2001 and settings for CPAP. No answer. LM for them to return call.

## 2016-05-10 ENCOUNTER — Telehealth: Payer: Self-pay | Admitting: *Deleted

## 2016-05-10 NOTE — Telephone Encounter (Signed)
Order placed for CPAP to [facility].   VM, need in last OV if pt is benefiting from CPAP so that it can be sent to [facility]. Last sleep study ws 2001 per Ria Comment at [facility]. Spoke with Anderson Malta at [facility] office and she states to send in [place] note with pt is benefiting from CPAP.

## 2016-05-10 NOTE — Telephone Encounter (Signed)
Last OV note with addendum to reflect benefit from CPAP  Thanks VM

## 2016-05-10 NOTE — Telephone Encounter (Signed)
Referral faxed for new cpap and supplies along with last ov note to [facility].  Nothing else needed at this time. Rhonda J Cobb

## 2016-05-10 NOTE — Telephone Encounter (Signed)
Monica Oliver,  Last OV complete to send with CPAP order. Thanks.

## 2016-05-10 NOTE — Telephone Encounter (Signed)
Received referral for low dose lung cancer screening CT scan. Attempted to leave voicemail at phone number listed in EMR for patient to call me back to facilitate scheduling scan. However, there is no voicemail capability at this time.

## 2016-05-25 ENCOUNTER — Telehealth: Payer: Self-pay | Admitting: *Deleted

## 2016-05-25 NOTE — Telephone Encounter (Signed)
Received referral for low dose lung cancer screening CT scan. Voicemail left at phone number listed in EMR for patient to call me back to facilitate scheduling scan.  

## 2016-05-26 ENCOUNTER — Telehealth: Payer: Self-pay | Admitting: *Deleted

## 2016-05-26 DIAGNOSIS — G4733 Obstructive sleep apnea (adult) (pediatric): Secondary | ICD-10-CM | POA: Diagnosis not present

## 2016-05-26 NOTE — Telephone Encounter (Signed)
Received referral for initial lung cancer screening scan. Contacted patient and obtained smoking history,(current smoker, 30 pack year) as well as answering questions related to screening process. Patient denies signs of lung cancer such as weight loss or hemoptysis. Patient denies comorbidity that would prevent curative treatment if lung cancer were found. Patient is tentatively scheduled for shared decision making visit and CT scan on 06/07/16 at 1:30pm, pending insurance approval from business office.

## 2016-06-01 DIAGNOSIS — G4733 Obstructive sleep apnea (adult) (pediatric): Secondary | ICD-10-CM | POA: Diagnosis not present

## 2016-06-06 ENCOUNTER — Other Ambulatory Visit: Payer: Self-pay | Admitting: *Deleted

## 2016-06-06 DIAGNOSIS — Z87891 Personal history of nicotine dependence: Secondary | ICD-10-CM

## 2016-06-07 ENCOUNTER — Inpatient Hospital Stay: Payer: 59 | Attending: Oncology | Admitting: Oncology

## 2016-06-07 ENCOUNTER — Encounter: Payer: Self-pay | Admitting: Oncology

## 2016-06-07 ENCOUNTER — Ambulatory Visit
Admission: RE | Admit: 2016-06-07 | Discharge: 2016-06-07 | Disposition: A | Discharge status: Home / Self Care | Payer: 59 | Source: Ambulatory Visit | Attending: Oncology | Admitting: Oncology

## 2016-06-07 DIAGNOSIS — Z720 Tobacco use: Secondary | ICD-10-CM | POA: Insufficient documentation

## 2016-06-07 DIAGNOSIS — I700 Atherosclerosis of aorta: Secondary | ICD-10-CM | POA: Diagnosis not present

## 2016-06-07 DIAGNOSIS — Z87891 Personal history of nicotine dependence: Secondary | ICD-10-CM

## 2016-06-07 DIAGNOSIS — I2510 Atherosclerotic heart disease of native coronary artery without angina pectoris: Secondary | ICD-10-CM | POA: Diagnosis not present

## 2016-06-07 DIAGNOSIS — F17210 Nicotine dependence, cigarettes, uncomplicated: Secondary | ICD-10-CM | POA: Diagnosis not present

## 2016-06-07 DIAGNOSIS — Z122 Encounter for screening for malignant neoplasm of respiratory organs: Secondary | ICD-10-CM

## 2016-06-07 NOTE — Progress Notes (Signed)
In accordance with CMS guidelines, patient has met eligibility criteria including age, absence of signs or symptoms of lung cancer.  Social History  Substance Use Topics  . Smoking status: Current Every Day Smoker    Packs/day: 0.75    Years: 40.00    Types: Cigarettes    Start date: 12/15/1971  . Smokeless tobacco: Never Used     Comment: [email or web address]- tried Chantix- made her nauseous. Has the nicotine patch- not started  . Alcohol use No     A shared decision-making session was conducted prior to the performance of CT scan. This includes one or more decision aids, includes benefits and harms of screening, follow-up diagnostic testing, over-diagnosis, false positive rate, and total radiation exposure.  Counseling on the importance of adherence to annual lung cancer LDCT screening, impact of co-morbidities, and ability or willingness to undergo diagnosis and treatment is imperative for compliance of the program.  Counseling on the importance of continued smoking cessation for former smokers; the importance of smoking cessation for current smokers, and information about tobacco cessation interventions have been given to patient including [place] and [facility] quit [place] programs.  Written order for lung cancer screening with LDCT has been given to the patient and any and all questions have been answered to the best of my abilities.   Yearly follow up will be coordinated by Burgess Estelle, Thoracic Navigator.

## 2016-06-09 ENCOUNTER — Telehealth: Payer: Self-pay | Admitting: *Deleted

## 2016-06-09 NOTE — Telephone Encounter (Signed)
Voicemail left for patient to call back to be notified of LDCT lung cancer screening results with recommendation for 12 month follow up imaging. Also will be notified of incidental finding noted below.   IMPRESSION: 1. Lung-RADS Category 1, negative. Continue annual screening with low-dose chest CT without contrast in 12 months. 2. Aortic atherosclerosis and RCA coronary artery calcification.

## 2016-06-13 ENCOUNTER — Ambulatory Visit (INDEPENDENT_AMBULATORY_CARE_PROVIDER_SITE_OTHER): Payer: 59 | Admitting: *Deleted

## 2016-06-13 ENCOUNTER — Other Ambulatory Visit: Payer: Self-pay

## 2016-06-13 VITALS — BP 128/68 | HR 72 | Ht 64.0 in | Wt 187.0 lb

## 2016-06-13 DIAGNOSIS — R0609 Other forms of dyspnea: Secondary | ICD-10-CM

## 2016-06-13 DIAGNOSIS — J449 Chronic obstructive pulmonary disease, unspecified: Secondary | ICD-10-CM

## 2016-06-13 DIAGNOSIS — R0600 Dyspnea, unspecified: Secondary | ICD-10-CM

## 2016-06-13 DIAGNOSIS — E119 Type 2 diabetes mellitus without complications: Secondary | ICD-10-CM

## 2016-06-13 LAB — PULMONARY FUNCTION TEST
DL/VA % pred: 79 %
DL/VA: 3.74 ml/min/mmHg/L
DLCO UNC: 15.11 ml/min/mmHg
DLCO unc % pred: 65 %
FEF 25-75 Post: 1.58 L/sec
FEF 25-75 Pre: 1.2 L/sec
FEF2575-%Change-Post: 31 %
FEF2575-%PRED-PRE: 64 %
FEF2575-%Pred-Post: 84 %
FEV1-%CHANGE-POST: 9 %
FEV1-%PRED-POST: 79 %
FEV1-%PRED-PRE: 73 %
FEV1-PRE: 1.4 L
FEV1-Post: 1.53 L
FEV1FVC-%CHANGE-POST: 0 %
FEV1FVC-%Pred-Pre: 95 %
FEV6-%Change-Post: 8 %
FEV6-%PRED-POST: 85 %
FEV6-%Pred-Pre: 78 %
FEV6-Post: 2 L
FEV6-Pre: 1.84 L
FEV6FVC-%PRED-POST: 104 %
FEV6FVC-%PRED-PRE: 104 %
FVC-%Change-Post: 8 %
FVC-%Pred-Post: 82 %
FVC-%Pred-Pre: 75 %
FVC-POST: 2 L
FVC-PRE: 1.84 L
POST FEV6/FVC RATIO: 100 %
PRE FEV1/FVC RATIO: 76 %
Post FEV1/FVC ratio: 76 %
Pre FEV6/FVC Ratio: 100 %
RV % pred: 116 %
RV: 2.34 L
TLC % PRED: 91 %
TLC: 4.48 L

## 2016-06-13 NOTE — Progress Notes (Signed)
SMW performed today. 

## 2016-06-13 NOTE — Progress Notes (Signed)
PFT performed today. 

## 2016-06-13 NOTE — Patient Outreach (Signed)
[facility]) Care Management  [place]  06/13/2016   Monica Oliver 02/19/1951 [id number]  Subjective: Met with patient today in the [facility] diabetes program.  She reports her recent A1C of 6.4%.  She reports she has seen Dr. Melina Schools for her respiratory problems, has had an MRI to rule out lung cancer and reports a clean scan.  She has a follow up appointment with him next month.  She reports taking numerous inhalers but she continues to smoke 3/4 pack of cigarettes per day.  Her daughter who lives with her also smokes.  She is not willing to quit.  She does not exercise.  She reports no hyper or hypoglycemia.  Fasting blood sugars 89-142m/dl and 2 hour post prandial numbers 138-1629mdl.  She reports taking her Metformin. She denies chest pain.   Objective:  Vitals:   06/13/16 0921  BP: 128/68  Pulse: 72  SpO2: 96%  Weight: 187 lb (84.8 kg)  Height: 1.626 m (5' 4" )     Encounter Medications:  Outpatient Encounter Prescriptions as of 06/13/2016  Medication Sig  . albuterol (PROVENTIL HFA;VENTOLIN HFA) 108 (90 BASE) MCG/ACT inhaler Inhale 1 puff into the lungs every 6 (six) hours as needed.  . Marland Kitchenspirin 81 MG tablet Take 81 mg by mouth daily. 1 tablet daily  . atenolol (TENORMIN) 25 MG tablet Take 25 mg by mouth daily. 1 tablet once a day  . atorvastatin (LIPITOR) 40 MG tablet Take 40 mg by mouth daily. 1 tab per day  . budesonide-formoterol (SYMBICORT) 160-4.5 MCG/ACT inhaler Inhale into the lungs 2 (two) times daily.   . cetirizine (ZYRTEC) 10 MG tablet Take 10 mg by mouth daily as needed.  . fluticasone (FLONASE) 50 MCG/ACT nasal spray Place 1 spray into both nostrils daily. As needed  . lisinopril-hydrochlorothiazide (PRINZIDE,ZESTORETIC) 20-12.5 MG per tablet Take 1 tablet by mouth daily.  . metFORMIN (GLUCOPHAGE) 500 MG tablet Take 500 mg by mouth daily.   . naproxen (NAPROSYN) 500 MG tablet Take 1 tablet (500 mg total) by mouth 2 (two) times  daily with a meal. Reported on 12/28/2015 (Patient taking differently: Take 500 mg by mouth 2 (two) times daily with a meal. Reported on 12/28/2015)  . omeprazole (PRILOSEC) 20 MG capsule Take 20 mg by mouth daily. prn  . TRUE METRIX BLOOD GLUCOSE TEST test strip   . umeclidinium bromide (INCRUSE ELLIPTA) 62.5 MCG/INH AEPB Inhale 1 puff into the lungs daily.   No facility-administered encounter medications on file as of 06/13/2016.     Functional Status:  In your present state of health, do you have any difficulty performing the following activities: 06/13/2016 03/07/2016  Hearing? N N  Vision? N N  Difficulty concentrating or making decisions? N N  Walking or climbing stairs? N N  Dressing or bathing? N N  Doing errands, shopping? N N  Some recent data might be hidden    Fall/Depression Screening: PHQ 2/9 Scores 06/13/2016 03/07/2016 11/02/2015 08/10/2015 12/15/2014  PHQ - 2 Score 0 0 0 0 0    Assessment: We discussed the importance of routine preventative exams.  She has a colonoscopy scheduled for September 26th.  We reviewed "know your carbs".  She has a good knowledge of what a carb is and how many servings she needs per meal.  Plan:  [facility] CM Care Plan Problem Two   Flowsheet Row Most Recent Value  Care Plan Problem Two  Potential for long term complications   Role Documenting  the Problem Two  Care Management Coordinator  Care Plan for Problem Two  Active  Interventions for Problem Two Long Term Goal   Reviewed need for preventative tests including oral screening. Reviewed carbs/proteins, counselled on quit smoking and benefits of exercise.    THN Long Term Goal (31-90) days  Patient will meet with MD on October 3 and ask about pneumonia vaccine, shingles vaccine. She will schdule a dilated eye exam before the end of 2017  [place] Term Goal Start Date  06/13/16     Follow up in 3 months.  Gentry Fitz, RN, BA, MHA, CDE Diabetes Coordinator Inpatient Diabetes Program   [phone] (Team Pager) [phone] ([place]) 06/13/2016 10:20 AM

## 2016-06-21 DIAGNOSIS — Z8371 Family history of colonic polyps: Secondary | ICD-10-CM | POA: Diagnosis not present

## 2016-06-21 DIAGNOSIS — Z86010 Personal history of colonic polyps: Secondary | ICD-10-CM | POA: Diagnosis not present

## 2016-06-23 ENCOUNTER — Encounter: Payer: Self-pay | Admitting: Internal Medicine

## 2016-06-23 ENCOUNTER — Ambulatory Visit (INDEPENDENT_AMBULATORY_CARE_PROVIDER_SITE_OTHER): Payer: 59 | Admitting: Internal Medicine

## 2016-06-23 VITALS — BP 138/78 | HR 77 | Ht 63.0 in | Wt 188.0 lb

## 2016-06-23 DIAGNOSIS — Z720 Tobacco use: Secondary | ICD-10-CM

## 2016-06-23 DIAGNOSIS — Z9989 Dependence on other enabling machines and devices: Secondary | ICD-10-CM

## 2016-06-23 DIAGNOSIS — F17200 Nicotine dependence, unspecified, uncomplicated: Secondary | ICD-10-CM

## 2016-06-23 DIAGNOSIS — J449 Chronic obstructive pulmonary disease, unspecified: Secondary | ICD-10-CM

## 2016-06-23 DIAGNOSIS — G4733 Obstructive sleep apnea (adult) (pediatric): Secondary | ICD-10-CM

## 2016-06-23 MED ORDER — ALBUTEROL SULFATE HFA 108 (90 BASE) MCG/ACT IN AERS: 1.0000 | INHALATION_SPRAY | Freq: Four times a day (QID) | RESPIRATORY_TRACT | 5 refills | Status: DC | PRN

## 2016-06-23 MED ORDER — UMECLIDINIUM BROMIDE 62.5 MCG/INH IN AEPB: 1.0000 | INHALATION_SPRAY | Freq: Every day | RESPIRATORY_TRACT | 5 refills | Status: DC

## 2016-06-23 MED ORDER — BUDESONIDE-FORMOTEROL FUMARATE 160-4.5 MCG/ACT IN AERO: 2.0000 | INHALATION_SPRAY | Freq: Two times a day (BID) | RESPIRATORY_TRACT | 5 refills | Status: DC

## 2016-06-23 NOTE — Progress Notes (Signed)
[facility] Consultation      MRN# [id number] Monica Oliver 02-25-1951   CC: Chief Complaint  Patient presents with  . Follow-up    47mo rov. PFT/SMW results.       Brief History: 65 year old smoker with history of COPD-asthma, tobacco abuse, following a pulmonary for COPD optimization. PFTs with moderate small airway disease and air trapping. Symbicort and Incruse as inhaler, tobacco cessation given.    Events since last clinic visit:  Present today for COPD follow up and PFTs results. Smoking about 6 cigs a day, down from 0.5ppd Still with some mild sob and dry cough Using incruse with good benefit, but not using symbicort consistently, keep forgetting to use daily.  Otherwise, with good overall improvement.    Current Outpatient Prescriptions:  .  albuterol (PROVENTIL HFA;VENTOLIN HFA) 108 (90 BASE) MCG/ACT inhaler, Inhale 1 puff into the lungs every 6 (six) hours as needed., Disp: , Rfl:  .  aspirin 81 MG tablet, Take 81 mg by mouth daily. 1 tablet daily, Disp: , Rfl:  .  atenolol (TENORMIN) 25 MG tablet, Take 25 mg by mouth daily. 1 tablet once a day, Disp: , Rfl:  .  atorvastatin (LIPITOR) 40 MG tablet, Take 40 mg by mouth daily. 1 tab per day, Disp: , Rfl:  .  budesonide-formoterol (SYMBICORT) 160-4.5 MCG/ACT inhaler, Inhale into the lungs 2 (two) times daily. , Disp: , Rfl:  .  cetirizine (ZYRTEC) 10 MG tablet, Take 10 mg by mouth daily as needed., Disp: , Rfl:  .  fluticasone (FLONASE) 50 MCG/ACT nasal spray, Place 1 spray into both nostrils daily. As needed, Disp: 16 g, Rfl: 12 .  lisinopril-hydrochlorothiazide (PRINZIDE,ZESTORETIC) 20-12.5 MG per tablet, Take 1 tablet by mouth daily., Disp: , Rfl:  .  metFORMIN (GLUCOPHAGE) 500 MG tablet, Take 500 mg by mouth daily. , Disp: , Rfl:  .  naproxen (NAPROSYN) 500 MG tablet, Take 1 tablet (500 mg total) by mouth 2 (two) times daily with a meal. Reported on 12/28/2015 (Patient taking differently: Take 500 mg  by mouth 2 (two) times daily with a meal. Reported on 12/28/2015), Disp: 60 tablet, Rfl: 6 .  omeprazole (PRILOSEC) 20 MG capsule, Take 20 mg by mouth daily. prn, Disp: , Rfl:  .  TRUE METRIX BLOOD GLUCOSE TEST test strip, , Disp: , Rfl: 99 .  umeclidinium bromide (INCRUSE ELLIPTA) 62.5 MCG/INH AEPB, Inhale 1 puff into the lungs daily., Disp: 30 each, Rfl: 5   Review of Systems  Constitutional: Negative for fever.  Eyes: Negative for blurred vision.  Respiratory: Positive for cough, sputum production and shortness of breath. Negative for hemoptysis and wheezing.   Cardiovascular: Negative for chest pain.  Gastrointestinal: Negative for heartburn.  Genitourinary: Negative for dysuria.  Musculoskeletal: Negative for myalgias.  Skin: Negative for rash.  Neurological: Negative for headaches.  Endo/Heme/Allergies: Does not bruise/bleed easily.      Allergies:  Penicillins  Physical Examination:  VS: There were no vitals taken for this visit.  General Appearance: No distress  HEENT: PERRLA, no ptosis, no other lesions noticed Pulmonary:normal breath sounds., diaphragmatic excursion normal.No wheezing, No rales   Cardiovascular:  Normal S1,S2.  No m/r/g.     Abdomen:Exam: Benign, Soft, non-tender, No masses  Skin:   warm, no rashes, no ecchymosis  Extremities: normal, no cyanosis, clubbing, warm with normal capillary refill.      Rad results: (The following images and results were reviewed by Dr. Stevenson Clinch on 06/23/2016). CT Chest -  lung Ca low dose FINDINGS: Cardiovascular: Normal heart size. No pericardial effusion. Aortic atherosclerosis is noted. Calcification involving the RCA coronary artery noted.  Mediastinum/Nodes: The trachea appears patent and is midline. Normal appearance of the esophagus. No mediastinal or hilar adenopathy.  Lungs/Pleura: No pleural fluid.  No pulmonary nodule is identified.  Upper Abdomen: No acute abnormality.  Musculoskeletal: Spondylosis is  noted throughout the thoracic spine. No aggressive lytic or sclerotic bone lesion.  IMPRESSION: 1. Lung-RADS Category 1, negative. Continue annual screening with low-dose chest CT without contrast in 12 months. 2. Aortic atherosclerosis and RCA coronary artery calcification.  PFTs 06/13/16 FEV1 73% FEV1/FVC 76% FEF 25-75 64% RV 116 TLC 91 RV/TLC 127 DLCO 65 Flow loops with end expiratory scooping Impression mild restriction with small airway obstruction, mild hyperinflation, no significant response to bronchodilators. Process consistent with mild obstructive disease, clinical correlation advised. 6 minute walk test: Total distance 945 feet lowest saturation 98%, highest heart rate 83.      Assessment and Plan: COPD (chronic obstructive pulmonary disease) ([place]) Patient is a current smoker, \ COPD Stage B  Her PFTs with moderate small airway disease and some hyperinflation  She's had at least 23 URIs in the past 2-3 years consecutively, she is probably more of a chronic bronchitis-type. Overall, cessation of tobacco would be paramount to her improving respiratory function. Today I have also explained the difference between maintenance and rescue inhalers along with proper usage, administration and side effects.  Cont with Symbicort and incruse - good benefit from this combination   Plan: - continue with Symbicort, Incruse, and as needed albuterol - tobacco cessation  Smoker Current smoker Half pack per day 30 years First-degree relative, mother, with lung cancer  Plan: Tobacco avoidance cessation counseling given to the patient Low Dose CT Chest is negative for nodules and/or masses.   OSA on CPAP Patient with known history of OSA, early on CPAP. Had a sleep study done 5 years ago, does not have access to the results.  Plan: Cont with CPAP nightly   Updated Medication List Outpatient Encounter Prescriptions as of 06/23/2016  Medication Sig  . albuterol  (PROVENTIL HFA;VENTOLIN HFA) 108 (90 BASE) MCG/ACT inhaler Inhale 1 puff into the lungs every 6 (six) hours as needed.  Marland Kitchen aspirin 81 MG tablet Take 81 mg by mouth daily. 1 tablet daily  . atenolol (TENORMIN) 25 MG tablet Take 25 mg by mouth daily. 1 tablet once a day  . atorvastatin (LIPITOR) 40 MG tablet Take 40 mg by mouth daily. 1 tab per day  . budesonide-formoterol (SYMBICORT) 160-4.5 MCG/ACT inhaler Inhale into the lungs 2 (two) times daily.   . cetirizine (ZYRTEC) 10 MG tablet Take 10 mg by mouth daily as needed.  . fluticasone (FLONASE) 50 MCG/ACT nasal spray Place 1 spray into both nostrils daily. As needed  . lisinopril-hydrochlorothiazide (PRINZIDE,ZESTORETIC) 20-12.5 MG per tablet Take 1 tablet by mouth daily.  . metFORMIN (GLUCOPHAGE) 500 MG tablet Take 500 mg by mouth daily.   . naproxen (NAPROSYN) 500 MG tablet Take 1 tablet (500 mg total) by mouth 2 (two) times daily with a meal. Reported on 12/28/2015 (Patient taking differently: Take 500 mg by mouth 2 (two) times daily with a meal. Reported on 12/28/2015)  . omeprazole (PRILOSEC) 20 MG capsule Take 20 mg by mouth daily. prn  . TRUE METRIX BLOOD GLUCOSE TEST test strip   . umeclidinium bromide (INCRUSE ELLIPTA) 62.5 MCG/INH AEPB Inhale 1 puff into the lungs daily.   No  facility-administered encounter medications on file as of 06/23/2016.     Orders for this visit: No orders of the defined types were placed in this encounter.   Thank  you for the visitation and for allowing  [facility] to assist in the care of your patient. Our recommendations are noted above.  Please contact us if we can be of further service.  Vilinda Boehringer, MD [facility] Office Number: [phone]  Note: This note was prepared with Dragon dictation along with smaller phrase technology. Any transcriptional errors that result from this process are unintentional.

## 2016-06-23 NOTE — Patient Instructions (Signed)
Follow up with Dr. Stevenson Clinch in:3 months - please take your symbicort as prescribed - 2 puff in the morning and 2 puffs in the evening, -gargle and rinse after each use.  - cont with incruse - Purse lip breathing for shortness of breath - cut back and eventually stop smoking

## 2016-06-23 NOTE — Assessment & Plan Note (Signed)
Patient is a current smoker, \ COPD Stage B  Her PFTs with moderate small airway disease and some hyperinflation  She's had at least 23 URIs in the past 2-3 years consecutively, she is probably more of a chronic bronchitis-type. Overall, cessation of tobacco would be paramount to her improving respiratory function. Today I have also explained the difference between maintenance and rescue inhalers along with proper usage, administration and side effects.  Cont with Symbicort and incruse - good benefit from this combination   Plan: - continue with Symbicort, Incruse, and as needed albuterol - tobacco cessation

## 2016-06-23 NOTE — Assessment & Plan Note (Signed)
Current smoker Half pack per day 30 years First-degree relative, mother, with lung cancer  Plan: Tobacco avoidance cessation counseling given to the patient Low Dose CT Chest is negative for nodules and/or masses.

## 2016-06-23 NOTE — Assessment & Plan Note (Signed)
Patient with known history of OSA, early on CPAP. Had a sleep study done 5 years ago, does not have access to the results.  Plan: Cont with CPAP nightly

## 2016-06-25 DIAGNOSIS — G4733 Obstructive sleep apnea (adult) (pediatric): Secondary | ICD-10-CM | POA: Diagnosis not present

## 2016-06-27 DIAGNOSIS — Z124 Encounter for screening for malignant neoplasm of cervix: Secondary | ICD-10-CM | POA: Diagnosis not present

## 2016-06-27 DIAGNOSIS — Z23 Encounter for immunization: Secondary | ICD-10-CM | POA: Diagnosis not present

## 2016-06-27 DIAGNOSIS — Z0000 Encounter for general adult medical examination without abnormal findings: Secondary | ICD-10-CM | POA: Diagnosis not present

## 2016-06-28 DIAGNOSIS — Z124 Encounter for screening for malignant neoplasm of cervix: Secondary | ICD-10-CM | POA: Diagnosis not present

## 2016-07-07 DIAGNOSIS — H52223 Regular astigmatism, bilateral: Secondary | ICD-10-CM | POA: Diagnosis not present

## 2016-07-25 ENCOUNTER — Ambulatory Visit
Admission: RE | Admit: 2016-07-25 | Discharge: 2016-07-25 | Disposition: A | Discharge status: Home / Self Care | Payer: 59 | Source: Ambulatory Visit | Attending: Family Medicine | Admitting: Family Medicine

## 2016-07-25 DIAGNOSIS — Z1231 Encounter for screening mammogram for malignant neoplasm of breast: Secondary | ICD-10-CM | POA: Insufficient documentation

## 2016-07-26 DIAGNOSIS — G4733 Obstructive sleep apnea (adult) (pediatric): Secondary | ICD-10-CM | POA: Diagnosis not present

## 2016-08-04 DIAGNOSIS — D125 Benign neoplasm of sigmoid colon: Secondary | ICD-10-CM | POA: Diagnosis not present

## 2016-08-04 DIAGNOSIS — Z86010 Personal history of colonic polyps: Secondary | ICD-10-CM | POA: Diagnosis not present

## 2016-08-04 DIAGNOSIS — D126 Benign neoplasm of colon, unspecified: Secondary | ICD-10-CM | POA: Diagnosis not present

## 2016-08-04 DIAGNOSIS — D124 Benign neoplasm of descending colon: Secondary | ICD-10-CM | POA: Diagnosis not present

## 2016-08-04 DIAGNOSIS — D123 Benign neoplasm of transverse colon: Secondary | ICD-10-CM | POA: Diagnosis not present

## 2016-08-04 DIAGNOSIS — K648 Other hemorrhoids: Secondary | ICD-10-CM | POA: Diagnosis not present

## 2016-08-04 DIAGNOSIS — K640 First degree hemorrhoids: Secondary | ICD-10-CM | POA: Diagnosis not present

## 2016-08-04 DIAGNOSIS — K635 Polyp of colon: Secondary | ICD-10-CM | POA: Diagnosis not present

## 2016-08-04 DIAGNOSIS — Z8371 Family history of colonic polyps: Secondary | ICD-10-CM | POA: Diagnosis not present

## 2016-09-02 DIAGNOSIS — G4733 Obstructive sleep apnea (adult) (pediatric): Secondary | ICD-10-CM | POA: Diagnosis not present

## 2016-09-05 ENCOUNTER — Other Ambulatory Visit: Payer: Self-pay

## 2016-09-05 VITALS — BP 128/68 | HR 76 | Ht 64.0 in | Wt 192.8 lb

## 2016-09-05 NOTE — Patient Outreach (Signed)
[facility]) Care Management  [place]  09/05/2016   Monica Oliver October 10, 1950 [id number]  Subjective: Met with Monica Oliver for her [facility] Wellness diabetes program through [facility].  She tells me she saw Dr. Shade Flood in October and that her last A1C was 6.4%.  She did bring her meter with her and her fasting blood sugars are 105-157m/dl and 2 hour post prandial blood sugars 96-1746mdl.  She denies hypo or hyper glycemia but reports she feels tired, even when blood sugars are 17887ml.  She is checking sugars well now but had neglected checking blood sugars in the past few months.  She skips breakfast and reports eating poorly over the past few months, resulting in some recent weight gain. She tells me she had a recent dilated eye exam, colonoscopy and mammogram.   Objective:  Vitals:   09/05/16 1109  BP: 128/68  Pulse: 76  SpO2: 94%  Weight: 192 lb 12.8 oz (87.5 kg)  Height: 1.626 m (5' 4" )     Encounter Medications:  Outpatient Encounter Prescriptions as of 09/05/2016  Medication Sig Note  . albuterol (PROVENTIL HFA;VENTOLIN HFA) 108 (90 Base) MCG/ACT inhaler Inhale 1 puff into the lungs every 6 (six) hours as needed.   . aMarland Kitchenpirin 81 MG tablet Take 81 mg by mouth daily. 1 tablet daily   . atenolol (TENORMIN) 25 MG tablet Take 25 mg by mouth daily. 1 tablet once a day   . atorvastatin (LIPITOR) 40 MG tablet Take 40 mg by mouth daily. 1 tab per day   . budesonide-formoterol (SYMBICORT) 160-4.5 MCG/ACT inhaler Inhale 2 puffs into the lungs 2 (two) times daily.   . cetirizine (ZYRTEC) 10 MG tablet Take 10 mg by mouth daily as needed.   . fluticasone (FLONASE) 50 MCG/ACT nasal spray Place 1 spray into both nostrils daily. As needed   . lisinopril-hydrochlorothiazide (PRINZIDE,ZESTORETIC) 20-12.5 MG per tablet Take 1 tablet by mouth daily.   . MMarland KitchenTFORMIN HCL PO Take 500 mg by mouth daily. Extended release   . naproxen (NAPROSYN) 500 MG tablet Take 1 tablet (500 mg  total) by mouth 2 (two) times daily with a meal. Reported on 12/28/2015 (Patient taking differently: Take 500 mg by mouth 2 (two) times daily with a meal. Reported on 12/28/2015)   . omeprazole (PRILOSEC) 20 MG capsule Take 20 mg by mouth daily. prn   . TRUE [facility]X BLOOD GLUCOSE TEST test strip    . umeclidinium bromide (INCRUSE ELLIPTA) 62.5 MCG/INH AEPB Inhale 1 puff into the lungs daily.   . Aspirin-Calcium Carbonate 81-300 MG TABS Take by mouth. 06/23/2016: Received from: [place] by mouth.   No facility-administered encounter medications on file as of 09/05/2016.     Functional Status:  In your present state of health, do you have any difficulty performing the following activities: 06/13/2016 03/07/2016  Hearing? N N  Vision? N N  Difficulty concentrating or making decisions? N N  Walking or climbing stairs? N N  Dressing or bathing? N N  Doing errands, shopping? N N  Some recent data might be hidden    Fall/Depression Screening: PHQ 2/9 Scores 09/05/2016 06/13/2016 03/07/2016 11/02/2015 08/10/2015 12/15/2014  PHQ - 2 Score 0 0 0 0 0 0    Assessment: Monica Oliver denies depression but has recently been neglecting checking sugars, is eating poorly and has gained weight. She does not exercise; she tells me about historical deaths in the family in October, November and December  when I ask about depression, but she denies depression at this time.  Her affect is flat and she showed up twice in the last 7 days on the wrong day or at the wrong time for this appointment. .We discussed smoking cessation but she continues to verbalize, "it's hard".  She had a CT of the lungs to evaluate for lung cancer and she struggles with COPD,  but she will NOT quit smoking.   We reviewed sick day guidelines and the importance of shingles and pneumonia vaccines. We discussed the importance of an oral exam every year.  Plan:  [facility] CM Care Plan Problem One   Flowsheet Row Most Recent  Value  Care Plan Problem One  potential for longe term complications  Role Documenting the Problem One  Care Management [place] for Problem One  Active  [facility] Long Term Goal (31-90 days)  Patient will schedule preventative health appointments in the next six months (by June 2017),  oral exam, shingles and pneumonia vaccine  [facility] Long Term Goal Start Date  09/05/16  Interventions for Problem One Long Term Goal  reviewed the impoirtance of exercise and stopping smoking.  Reviewed the importance of checking blood sugars consistently and the importance of having an A1C.      I have asked this patient to check sugars a minimum of 3 x/week. I have given her the Quit line at [facility] for quitting smoking. I enducated [name] on the [employer] product we will use for Link to Wellness in 2018.  Follow up scheduled for January 2018.   Gentry Fitz, RN, BA, [place], [place] Direct Dial:  [phone]  Fax:  [phone] E-mail: [email or web address] [address] [place], [place]  [address]

## 2016-09-12 ENCOUNTER — Ambulatory Visit: Payer: Self-pay

## 2016-09-24 ENCOUNTER — Emergency Department
Admission: EM | Admit: 2016-09-24 | Discharge: 2016-09-24 | Disposition: A | Discharge status: Home / Self Care | Payer: 59 | Attending: Emergency Medicine | Admitting: Emergency Medicine

## 2016-09-24 ENCOUNTER — Emergency Department: Payer: 59

## 2016-09-24 DIAGNOSIS — R0602 Shortness of breath: Secondary | ICD-10-CM | POA: Diagnosis present

## 2016-09-24 DIAGNOSIS — R079 Chest pain, unspecified: Secondary | ICD-10-CM | POA: Diagnosis not present

## 2016-09-24 DIAGNOSIS — J441 Chronic obstructive pulmonary disease with (acute) exacerbation: Secondary | ICD-10-CM | POA: Diagnosis not present

## 2016-09-24 DIAGNOSIS — J45909 Unspecified asthma, uncomplicated: Secondary | ICD-10-CM | POA: Diagnosis not present

## 2016-09-24 DIAGNOSIS — I10 Essential (primary) hypertension: Secondary | ICD-10-CM | POA: Diagnosis not present

## 2016-09-24 DIAGNOSIS — Z79899 Other long term (current) drug therapy: Secondary | ICD-10-CM | POA: Diagnosis not present

## 2016-09-24 DIAGNOSIS — J189 Pneumonia, unspecified organism: Secondary | ICD-10-CM | POA: Diagnosis not present

## 2016-09-24 DIAGNOSIS — Z7982 Long term (current) use of aspirin: Secondary | ICD-10-CM | POA: Diagnosis not present

## 2016-09-24 DIAGNOSIS — F17210 Nicotine dependence, cigarettes, uncomplicated: Secondary | ICD-10-CM | POA: Diagnosis not present

## 2016-09-24 DIAGNOSIS — R05 Cough: Secondary | ICD-10-CM | POA: Diagnosis not present

## 2016-09-24 DIAGNOSIS — E119 Type 2 diabetes mellitus without complications: Secondary | ICD-10-CM | POA: Diagnosis not present

## 2016-09-24 LAB — COMPREHENSIVE METABOLIC PANEL
ALBUMIN: 3.8 g/dL (ref 3.5–5.0)
ALT: 19 U/L (ref 14–54)
ANION GAP: 8 (ref 5–15)
AST: 33 U/L (ref 15–41)
Alkaline Phosphatase: 72 U/L (ref 38–126)
BUN: 12 mg/dL (ref 6–20)
CHLORIDE: 99 mmol/L — AB (ref 101–111)
CO2: 25 mmol/L (ref 22–32)
Calcium: 9.4 mg/dL (ref 8.9–10.3)
Creatinine, Ser: 0.82 mg/dL (ref 0.44–1.00)
GFR calc Af Amer: >60 mL/min (ref >60)
GFR calc non Af Amer: >60 mL/min (ref >60)
GLUCOSE: 135 mg/dL — AB (ref 65–99)
POTASSIUM: 4 mmol/L (ref 3.5–5.1)
SODIUM: 132 mmol/L — AB (ref 135–145)
TOTAL PROTEIN: 7.3 g/dL (ref 6.5–8.1)
Total Bilirubin: 0.4 mg/dL (ref 0.3–1.2)

## 2016-09-24 LAB — CBC
HCT: 38 % (ref 35.0–47.0)
Hemoglobin: 12.6 g/dL (ref 12.0–16.0)
MCH: 27.1 pg (ref 26.0–34.0)
MCHC: 33.2 g/dL (ref 32.0–36.0)
MCV: 81.5 fL (ref 80.0–100.0)
PLATELETS: 265 10*3/uL (ref 150–440)
RBC: 4.66 MIL/uL (ref 3.80–5.20)
RDW: 17.2 % — AB (ref 11.5–14.5)
WBC: 7.6 10*3/uL (ref 3.6–11.0)

## 2016-09-24 LAB — INFLUENZA PANEL BY PCR (TYPE A & B)
Influenza A By PCR: NEGATIVE
Influenza B By PCR: NEGATIVE

## 2016-09-24 LAB — TROPONIN I

## 2016-09-24 LAB — GLUCOSE, CAPILLARY: GLUCOSE-CAPILLARY: 149 mg/dL — AB (ref 65–99)

## 2016-09-24 LAB — LACTIC ACID, PLASMA: LACTIC ACID, VENOUS: 1.8 mmol/L (ref 0.5–1.9)

## 2016-09-24 MED ORDER — DOXYCYCLINE HYCLATE 50 MG PO CAPS
100.0000 mg | ORAL_CAPSULE | Freq: Two times a day (BID) | ORAL | 0 refills | Status: DC
Start: 2016-09-24 — End: 2016-11-10

## 2016-09-24 MED ORDER — ACETAMINOPHEN 325 MG PO TABS
ORAL_TABLET | ORAL | Status: AC
  Administered 2016-09-24: 650 mg via ORAL
  Filled 2016-09-24: qty 2

## 2016-09-24 MED ORDER — IPRATROPIUM-ALBUTEROL 0.5-2.5 (3) MG/3ML IN SOLN
3.0000 mL | Freq: Once | RESPIRATORY_TRACT | Status: AC
  Administered 2016-09-24: 3 mL via RESPIRATORY_TRACT
  Filled 2016-09-24: qty 3

## 2016-09-24 MED ORDER — ACETAMINOPHEN 325 MG PO TABS
650.0000 mg | ORAL_TABLET | Freq: Once | ORAL | Status: AC
  Administered 2016-09-24: 650 mg via ORAL

## 2016-09-24 MED ORDER — ALBUTEROL SULFATE (2.5 MG/3ML) 0.083% IN NEBU
5.0000 mg | INHALATION_SOLUTION | Freq: Once | RESPIRATORY_TRACT | Status: AC
  Administered 2016-09-24: 5 mg via RESPIRATORY_TRACT
  Filled 2016-09-24: qty 6

## 2016-09-24 MED ORDER — DOXYCYCLINE HYCLATE 100 MG PO TABS
100.0000 mg | ORAL_TABLET | Freq: Once | ORAL | Status: AC
  Administered 2016-09-24: 100 mg via ORAL
  Filled 2016-09-24: qty 1

## 2016-09-24 MED ORDER — METHYLPREDNISOLONE SODIUM SUCC 125 MG IJ SOLR
125.0000 mg | Freq: Once | INTRAMUSCULAR | Status: AC
  Administered 2016-09-24: 125 mg via INTRAVENOUS
  Filled 2016-09-24: qty 2

## 2016-09-24 MED ORDER — PREDNISONE 10 MG PO TABS: ORAL_TABLET | ORAL | 0 refills | Status: DC

## 2016-09-24 NOTE — Discharge Instructions (Signed)
You're being treated for pneumonia and COPD exacerbation with prednisone, albuterol, and antibiotic doxycycline.  Return to the emergency department for any worsening symptoms of trouble breathing, chest pain, shortness of breath, or fevers, chills, dizziness, passing out, altered mental status, or any other symptoms concerning to you.

## 2016-09-24 NOTE — ED Provider Notes (Signed)
[facility] Emergency Department Provider Note ____________________________________________   I have reviewed the triage vital signs and the triage nursing note.  HISTORY  Chief Complaint Shortness of Breath   Historian Patient  HPI Monica Oliver is a 65 y.o. female with a history of COPD, states that she started feeling body aches and possible fever/chills yesterday and then woke up today feeling body aches everywhere but more so in the left upper back and over the left shoulder slightly into the left upper chest. She's had a cough this morning which is not really productive, and also has some wheezing associated. No documented fever. No nausea or vomiting, but one diarrhea this morning.  No focal weakness or numbness. No confusion altered mental status. States she has not been on steroids/prednisone in a long time.  Soreness and discomfort in the left upper chest and posterior is mild to moderate. Her wheezing and trouble breathing is moderate. She did receive 1 DuoNeb prior to my seeing her and she states that did help.  States she doesn't really like azithromycin. States that she has an allergy to penicillin but it was long time ago and she doesn't remember what it was.    Past Medical History:  Diagnosis Date  . Allergy    environmental  . Arthritis   . Asthma   . COPD (chronic obstructive pulmonary disease) ([place])   . Diabetes mellitus without complication ([place])   . GERD (gastroesophageal reflux disease)   . Hyperlipidemia   . Hypertension   . Sleep apnea    C-Pap machine    Patient Active Problem List   Diagnosis Date Noted  . Personal history of tobacco use, presenting hazards to health 06/07/2016  . COPD (chronic obstructive pulmonary disease) ([place]) 04/18/2016  . DOE (dyspnea on exertion) 04/18/2016  . Tobacco abuse counseling 04/18/2016  . Smoker 04/18/2016  . OSA on CPAP 04/18/2016  . Diabetes ([place]) 05/04/2015    Past Surgical  History:  Procedure Laterality Date  . CESAREAN SECTION     1986  . CHOLECYSTECTOMY     1993    Prior to Admission medications   Medication Sig Start Date End Date Taking? Authorizing Provider  albuterol (PROVENTIL HFA;VENTOLIN HFA) 108 (90 Base) MCG/ACT inhaler Inhale 1 puff into the lungs every 6 (six) hours as needed. 06/23/16  Yes Vishal Mungal, MD  aspirin 81 MG tablet Take 81 mg by mouth daily. 1 tablet daily   Yes Historical Provider, MD  atenolol (TENORMIN) 25 MG tablet Take 25 mg by mouth daily. 1 tablet once a day   Yes Historical Provider, MD  atorvastatin (LIPITOR) 40 MG tablet Take 40 mg by mouth daily. 1 tab per day   Yes Historical Provider, MD  budesonide-formoterol (SYMBICORT) 160-4.5 MCG/ACT inhaler Inhale 2 puffs into the lungs 2 (two) times daily. 06/23/16  Yes Vishal Mungal, MD  cetirizine (ZYRTEC) 10 MG tablet Take 10 mg by mouth daily as needed.   Yes Historical Provider, MD  fluticasone (FLONASE) 50 MCG/ACT nasal spray Place 1 spray into both nostrils daily. As needed 03/03/16  Yes Versie Starks, PA-C  lisinopril-hydrochlorothiazide (PRINZIDE,ZESTORETIC) 20-12.5 MG per tablet Take 1 tablet by mouth daily.   Yes Historical Provider, MD  metFORMIN (GLUCOPHAGE) 500 MG tablet Take 500 mg by mouth daily. Extended release   Yes Historical Provider, MD  naproxen (NAPROSYN) 500 MG tablet Take 1 tablet (500 mg total) by mouth 2 (two) times daily with a meal. Reported on 12/28/2015 Patient  taking differently: Take 500 mg by mouth 2 (two) times daily with a meal. Reported on 12/28/2015 03/03/16  Yes Versie Starks, PA-C  omeprazole (PRILOSEC) 20 MG capsule Take 20 mg by mouth daily. prn   Yes Historical Provider, MD  umeclidinium bromide (INCRUSE ELLIPTA) 62.5 MCG/INH AEPB Inhale 1 puff into the lungs daily. 06/23/16  Yes Vishal Mungal, MD  doxycycline (VIBRAMYCIN) 50 MG capsule Take 2 capsules (100 mg total) by mouth 2 (two) times daily. 09/24/16   Lisa Roca, MD  predniSONE (DELTASONE)  10 MG tablet 50 mg once daily for 4 more days 09/24/16   Lisa Roca, MD  TRUE [facility] BLOOD GLUCOSE TEST test strip  02/19/16   Historical Provider, MD    Allergies  Allergen Reactions  . Penicillins Other (See Comments)    Unsure- she's had it for so long    Family History  Problem Relation Age of Onset  . Hypertension Mother   . Cancer Mother     lung  . Cancer Father     prostate  . Hyperlipidemia Father   . Heart disease Father   . Cancer Brother     prostate  . Cirrhosis Sister   . Hepatitis Sister   . Breast cancer Paternal Aunt     Social History Social History  Substance Use Topics  . Smoking status: Current Every Day Smoker    Packs/day: 0.75    Years: 40.00    Types: Cigarettes    Start date: 12/15/1971  . Smokeless tobacco: Never Used     Comment: feb.2017- tried Chantix- made her nauseous. Has the nicotine patch- not started  . Alcohol use No    Review of Systems  Constitutional: Question of fevers and chills, no documented. Eyes: Negative for visual changes. ENT: Negative for sore throat. Cardiovascular: A little chest discomfort over the left upper chest wall but more so posteriorly. The reticulocyte component of chest discomfort. Respiratory: Positive for shortness of breath and wheezing. Gastrointestinal: Negative for abdominal pain, vomiting and diarrhea. Genitourinary: Negative for dysuria. Musculoskeletal: Negative for low back pain. Skin: Negative for rash. Neurological: Negative for headache. 10 point Review of Systems otherwise negative ____________________________________________   PHYSICAL EXAM:  VITAL SIGNS: ED Triage Vitals [09/24/16 0713]  Enc Vitals Group     BP 119/68     Pulse Rate 94     Resp 18     Temp 98.5 F (36.9 C)     Temp Source Oral     SpO2 99 %     Weight 190 lb (86.2 kg)     Height 5\' 3"  (1.6 m)     Head Circumference      Peak Flow      Pain Score 5     Pain Loc      Pain Edu?      Excl. in [place]?       Constitutional: Alert and oriented. Well appearing and in no distress. HEENT   Head: Normocephalic and atraumatic.      Eyes: Conjunctivae are normal. PERRL. Normal extraocular movements.      Ears:         Nose: No congestion/rhinnorhea.   Mouth/Throat: Mucous membranes are moist.   Neck: No stridor. Cardiovascular/Chest: Normal rate, regular rhythm.  No murmurs, rubs, or gallops. Respiratory: Normal respiratory effort without tachypnea nor retractions. She does have moderate wheezing especially with inspiration and exhalation. Moderate rhonchi posteriorly bilaterally. Gastrointestinal: Soft. No distention, no guarding, no rebound. Nontender.  Genitourinary/rectal:Deferred Musculoskeletal: Nontender with normal range of motion in all extremities. No joint effusions.  No lower extremity tenderness.  No edema. Neurologic:  Normal speech and language. No gross or focal neurologic deficits are appreciated. Skin:  Skin is warm, dry and intact. No rash noted. Psychiatric: Mood and affect are normal. Speech and behavior are normal. Patient exhibits appropriate insight and judgment.   ____________________________________________  LABS (pertinent positives/negatives)  Labs Reviewed  CBC - Abnormal; Notable for the following:       Result Value   RDW 17.2 (*)    All other components within normal limits  COMPREHENSIVE METABOLIC PANEL - Abnormal; Notable for the following:    Sodium 132 (*)    Chloride 99 (*)    Glucose, Bld 135 (*)    All other components within normal limits  GLUCOSE, CAPILLARY - Abnormal; Notable for the following:    Glucose-Capillary 149 (*)    All other components within normal limits  CULTURE, BLOOD (ROUTINE X 2)  CULTURE, BLOOD (ROUTINE X 2)  TROPONIN I  INFLUENZA PANEL BY PCR (TYPE A & B, H1N1)  LACTIC ACID, PLASMA  LACTIC ACID, PLASMA    ____________________________________________    EKG I, Lisa Roca, MD, the attending physician  have personally viewed and interpreted all ECGs.  90 bpm. Normal sinus rhythm. Narrow QRS. Normal axis. Nonspecific T-wave ____________________________________________  RADIOLOGY All Xrays were viewed by me. Imaging interpreted by Radiologist.  Chest x-ray two-view:   IMPRESSION: New left upper lobe peribronchial opacity since the chest CT in September is nonspecific. If the patient is febrile consider bronchopneumonia. No pleural effusion. __________________________________________  PROCEDURES  Procedure(s) performed: None  Critical Care performed: None  ____________________________________________   ED COURSE / ASSESSMENT AND PLAN  Pertinent labs & imaging results that were available during my care of the patient were reviewed by me and considered in my medical decision making (see chart for details).   Ms. Albertina Parr is here for shortness of breath and wheezing as well as cough, but more so because it also associated with some pain in the left upper back. She did receive a DuoNeb treatment by protocol prior to me seeing her and states that she's improved, however she still has a significant wheezing. I don't think she is a component of COPD exacerbation and will give her another DuoNeb as well as prednisone. Her chest x-ray shows likely left upper lobe infiltrate and she is reporting some symptoms of pain in that area as well as some body aches and I am going to treat her for the left upper lobe pneumonia. She reports history penicillins although unclear what it was. She states that she doesn't like azithromycin. I am going to treat her with doxycycline.  Overall I don't think that she is showing any signs of sepsis.  Blood cultures were sent.  Ultimately, with no desats and tolerating walking, I think she is okay for outpatient management and she feels comfortable with this plan as well.     CONSULTATIONS:   None  Patient / Family / Caregiver informed of clinical course,  medical decision-making process, and agree with plan.   I discussed return precautions, follow-up instructions, and discharge instructions with patient and/or family.   ___________________________________________   FINAL CLINICAL IMPRESSION(S) / ED DIAGNOSES   Final diagnoses:  Community acquired pneumonia of left lung, unspecified part of lung  COPD exacerbation ([place])              Note: This dictation was  prepared with [employer] . Any transcriptional errors that result from this process are unintentional    Lisa Roca, MD 09/24/16 1242

## 2016-09-24 NOTE — ED Notes (Signed)
Pt ambulated to bathroom in hallway and back, oxygen dropped from 96% to 90-93% with ambulating. Pt does not show signs of distress. Ambulates with ease. RR even and unlabored. Pt back in bed at this time, oxygen 96% at this time.

## 2016-09-24 NOTE — ED Notes (Signed)
Pt placed on droplet precautions, doors shut and sign on door.

## 2016-09-24 NOTE — ED Notes (Signed)
Pt c/o 10/10 headache.

## 2016-09-24 NOTE — ED Notes (Signed)
Pt reports cough and congestion associated with SOB that began last night. Reports pain in upper back X 3 days, that does not correlate only with coughing. CP with coughing. Pt ambulatory back to room. Denied needing wheelchair. Pt alert and oriented X4, active, cooperative, pt in NAD. RR even and unlabored, color WNL.  Pt has audible wheezing, hx of COPD. Has used inhalers at home with no relief.

## 2016-09-24 NOTE — ED Notes (Signed)
Patient transported to X-ray 

## 2016-09-24 NOTE — ED Notes (Signed)
ED Provider at bedside. 

## 2016-09-24 NOTE — ED Triage Notes (Signed)
Pt states that she has had a cough with congestion since last night, pt states that she woke up coughing, pt states that yesterday she felt as if her chest was tight and that she couldn't get enough air, pt reports intermittent pain on the left side of her chest, pt reports that she felt like she was febrile at some point but was able to work yesterday, no distress noted at this time, however pt does have a very coarse sounding cough that is productive

## 2016-09-27 ENCOUNTER — Encounter: Payer: Self-pay | Admitting: Pulmonary Disease

## 2016-09-27 ENCOUNTER — Ambulatory Visit (INDEPENDENT_AMBULATORY_CARE_PROVIDER_SITE_OTHER): Payer: 59 | Admitting: Pulmonary Disease

## 2016-09-27 VITALS — BP 144/72 | HR 96 | Ht 63.0 in | Wt 195.0 lb

## 2016-09-27 DIAGNOSIS — F17200 Nicotine dependence, unspecified, uncomplicated: Secondary | ICD-10-CM

## 2016-09-27 DIAGNOSIS — J181 Lobar pneumonia, unspecified organism: Secondary | ICD-10-CM

## 2016-09-27 DIAGNOSIS — J189 Pneumonia, unspecified organism: Secondary | ICD-10-CM

## 2016-09-27 DIAGNOSIS — J449 Chronic obstructive pulmonary disease, unspecified: Secondary | ICD-10-CM | POA: Diagnosis not present

## 2016-09-27 MED ORDER — ALBUTEROL SULFATE (2.5 MG/3ML) 0.083% IN NEBU: 2.5000 mg | INHALATION_SOLUTION | Freq: Four times a day (QID) | RESPIRATORY_TRACT | 5 refills | Status: DC | PRN

## 2016-09-27 NOTE — Patient Instructions (Addendum)
Complete the antibiotic (Doxycycline) and prednisone as prescribed in the emergency department  I have sent prescription for albuterol nebulizer solution  Continue Symbicort and Incruse for now - we might be able to stop one of these in the future  Smoking cessation as we discussed  I recommend that you take the rest of the week off work. You may return to work on Jan 8th  Follow up with me in 6 weeks with chest Xray

## 2016-09-27 NOTE — Progress Notes (Signed)
PULMONARY OFFICE FOLLOW UP NOTE  PROBLEMS:  Mild COPD Smoker  DATA: LDCT 06/07/16: normal PFTs 06/13/16: mild obstruction with borderline significant improvement after bronchodilator. Normal TLC. Mildly reduced DLCO  INTERVAL HISTORY: Previously seen by VM and diagnosed with mild COPD.   SUBJ: Former VM pt. Seen in ED 09/23/16 with increased dyspnea, cough, chest tightness for and pain across her shoulder blades for 5 days prior to her presentation. A CXR revealed a new left upper lobe peribronchial opacity and she was diagnosed with PNA. She has been treated with doxycycline and prednisone and deems herself to be approx 75% back to baseline.   OBJ: Vitals:   09/27/16 1112  BP: (!) 144/72  Pulse: 96  SpO2: 95%  Weight: 195 lb (88.5 kg)  Height: 5\' 3"  (1.6 m)    Gen: NAD HEENT: WNL Neck: NO LAN, no JVD noted Lungs: full BS, normal percussion note, no wheezes or other adventitious sounds Cardiovascular: Reg, no M noted Abdomen: Soft, NT +BS Ext: no C/C/E Neuro: CNs intact, motor/sens grossly intact Skin: No lesions noted   DATA:   CXR 09/24/16: vague left upper lobe opacity  IMPRESSION: 1) Mild COPD 2) Recalcitrant smoker 3) CAP, NOS - responding well to doxy and pred   PLAN: 1) Complete doxy and pred rx as prescribed 2) Per her request, I have refilled her prescription for albuterol nebulizer solution 3) Continue Symbicort and Incruse - in the future, we might be able to drop one of these 4) I recommended that she take the rest of this week off work. If she feels up to it, she may return to work 10/03/16 5) Counseled re: smoking cessation 6) Follow up in 6 weeks with CXR   Merton Border, MD PCCM service Mobile [phone] Pager [phone] 09/29/2016

## 2016-09-29 LAB — CULTURE, BLOOD (ROUTINE X 2)
CULTURE: NO GROWTH
Culture: NO GROWTH

## 2016-10-07 ENCOUNTER — Telehealth: Payer: Self-pay | Admitting: Pulmonary Disease

## 2016-10-07 NOTE — Telephone Encounter (Signed)
Patient needs her FMLA stat! It was faxed over a week ago  . Please call patient.

## 2016-10-07 NOTE — Telephone Encounter (Signed)
Spoke with pt and informed her that Suanne Marker had faxed what DS had signed on 09/27/16. She states she will call [employer] back and speak with them. Nothing further needed at this time.

## 2016-10-17 ENCOUNTER — Ambulatory Visit: Payer: Self-pay | Admitting: Physician Assistant

## 2016-10-17 DIAGNOSIS — R05 Cough: Secondary | ICD-10-CM

## 2016-10-17 DIAGNOSIS — R059 Cough, unspecified: Secondary | ICD-10-CM

## 2016-10-18 NOTE — Progress Notes (Signed)
See notes by Lenna Sciara Stump FNP

## 2016-10-21 NOTE — Telephone Encounter (Signed)
Forms received from [employer].  Placed in lbpu box.

## 2016-10-25 ENCOUNTER — Telehealth: Payer: Self-pay | Admitting: Pulmonary Disease

## 2016-10-25 NOTE — Telephone Encounter (Signed)
Pt is calling regarding status for her FMLA forms please call and advise.

## 2016-10-25 NOTE — Telephone Encounter (Signed)
Routing to [place] triage pool as this is a BT pt.

## 2016-10-26 NOTE — Telephone Encounter (Signed)
Called pt per DS asking why she needs FMLA for her breathing issues. Will await call back.

## 2016-10-26 NOTE — Telephone Encounter (Signed)
Papers put on desk of DS to fill out highlighted areas and sign. Will send to [employer] once complete.

## 2016-10-26 NOTE — Telephone Encounter (Signed)
Pt states per policy that when you are out more than 3 consecutive days you have to have FMLA filled out per policy. She states she was out more than 3 days due to PNA.

## 2016-11-04 NOTE — Telephone Encounter (Signed)
[employer] - Nika calling to check status of these papers.  Please call  [phone].

## 2016-11-04 NOTE — Telephone Encounter (Signed)
LMOVM that I had just received papers from DS and will interoffice them back to her.

## 2016-11-10 ENCOUNTER — Ambulatory Visit
Admission: RE | Admit: 2016-11-10 | Discharge: 2016-11-10 | Disposition: A | Discharge status: Home / Self Care | Payer: 59 | Source: Ambulatory Visit | Attending: Pulmonary Disease | Admitting: Pulmonary Disease

## 2016-11-10 ENCOUNTER — Ambulatory Visit (INDEPENDENT_AMBULATORY_CARE_PROVIDER_SITE_OTHER): Payer: 59 | Admitting: Internal Medicine

## 2016-11-10 ENCOUNTER — Encounter: Payer: Self-pay | Admitting: Internal Medicine

## 2016-11-10 ENCOUNTER — Ambulatory Visit: Payer: Self-pay | Admitting: Pulmonary Disease

## 2016-11-10 ENCOUNTER — Other Ambulatory Visit: Payer: Self-pay | Admitting: Pulmonary Disease

## 2016-11-10 VITALS — BP 124/84 | HR 78

## 2016-11-10 DIAGNOSIS — J189 Pneumonia, unspecified organism: Secondary | ICD-10-CM

## 2016-11-10 DIAGNOSIS — R400 Somnolence: Secondary | ICD-10-CM

## 2016-11-10 DIAGNOSIS — J181 Lobar pneumonia, unspecified organism: Principal | ICD-10-CM

## 2016-11-10 DIAGNOSIS — Z09 Encounter for follow-up examination after completed treatment for conditions other than malignant neoplasm: Secondary | ICD-10-CM | POA: Insufficient documentation

## 2016-11-10 DIAGNOSIS — Z8701 Personal history of pneumonia (recurrent): Secondary | ICD-10-CM | POA: Insufficient documentation

## 2016-11-10 DIAGNOSIS — J449 Chronic obstructive pulmonary disease, unspecified: Secondary | ICD-10-CM | POA: Diagnosis not present

## 2016-11-10 DIAGNOSIS — R05 Cough: Secondary | ICD-10-CM | POA: Diagnosis not present

## 2016-11-10 MED ORDER — GUAIFENESIN-CODEINE 100-10 MG/5ML PO SOLN: 5.0000 mL | ORAL | 0 refills | Status: DC | PRN

## 2016-11-10 NOTE — Addendum Note (Signed)
Addended by: Oscar La R on: 11/10/2016 10:14 AM   Modules accepted: Orders

## 2016-11-10 NOTE — Progress Notes (Signed)
PULMONARY OFFICE FOLLOW UP NOTE  PROBLEMS:  Mild COPD Smoker  DATA: LDCT 06/07/16: normal PFTs 06/13/16: mild obstruction with borderline significant improvement after bronchodilator. Normal TLC. Mildly reduced DLCO  INTERVAL HISTORY: Previously seen by VM and diagnosed with mild COPD.   SUBJ: Former VM pt.  Seen in ED 09/23/16 with increased dyspnea, cough, chest tightness for and pain across her shoulder blades for 5 days prior to her presentation.  -A CXR revealed a new left upper lobe peribronchial opacity and she was diagnosed with PNA.  -She has been treated with doxycycline and prednisone and deems herself to be approx 75% back to baseline.   Follow up pneumonia and COPD exacerbation today No acute issues Repeat CXR shows interval improvement of LUL opacity Patient also has OSA on CPAP therapy Says she gasps for air at night    BP 124/84 (BP Location: Left Arm, Cuff Size: Normal)   Pulse 78   SpO2 97%     Review of Systems:  Gen:  Denies  fever, sweats, chills weigh loss   HEENT: Denies blurred vision, double vision, ear pain, eye pain, hearing loss, nose bleeds, sore throat  Cardiac:  No dizziness, chest pain or heaviness, chest tightness,edema  Resp:   Denies cough or sputum porduction, shortness of breath,wheezing, hemoptysis,   Other:  All other systems negative   Gen: NAD HEENT: WNL Neck: NO LAN, no JVD noted Lungs: full BS, normal percussion note, no wheezes or other adventitious sounds Cardiovascular: Reg, no M noted Abdomen: Soft, NT +BS Ext: no C/C/E Neuro: CNs intact, motor/sens grossly intact Skin: No lesions noted   DATA:   CXR 09/24/16: vague left upper lobe opacity   CXR 11/10/16 : resolution of LUL opacity   IMPRESSION: 66 yo AAF with MICL COPD gold stage A with OSA with ongoing smoking Patient has resolution of LUL opacity   PLAN:  1.continue Incruse and Symbicort 2.STOP SMOKING QUIT DATE MAY 11TH 3.albuterol as  needed 4.Robitussin with Codeine 5.she needs Split night for re-assessment of OSA  I have personally obtained a history, examined the patient, evaluated Pertinent laboratory and RadioGraphic/imaging results, and  formulated the assessment and plan   The Patient requires high complexity decision making for assessment and support, frequent evaluation and titration of therapies.   Patient satisfied with Plan of action and management. All questions answered  Corrin Parker, M.D.  [facility]  Medical Director [place] Director [facility] Cardio-Pulmonary Department

## 2016-11-10 NOTE — Patient Instructions (Signed)
1.continue Incruse and Symbicort 2.STOP SMOKING QUIT DATE MAY 11TH 3.albuterol as needed 4.Robitussin with Codeine 5.she needs Split night for re-assessment of OSA

## 2016-11-21 ENCOUNTER — Telehealth: Payer: Self-pay | Admitting: Internal Medicine

## 2016-11-21 NOTE — Telephone Encounter (Signed)
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  Please call her once we finished it She said it needs to be done today.  They stated they faxed those paper work Friday

## 2016-11-21 NOTE — Telephone Encounter (Signed)
Monica Oliver has faxed forms to provided number.  Pt aware and voiced her understanding.  Will route to [facility] to f/u on.

## 2016-11-21 NOTE — Telephone Encounter (Signed)
lmtcb X1 for pt  

## 2016-11-21 NOTE — Telephone Encounter (Signed)
Form completed and faxed to Deeann Saint at [facility] -fax number [phone].  Confirmation received via fax machine that form was received by [employer]. Rhonda J Cobb

## 2016-11-24 DIAGNOSIS — I10 Essential (primary) hypertension: Secondary | ICD-10-CM | POA: Diagnosis not present

## 2016-11-24 DIAGNOSIS — F17210 Nicotine dependence, cigarettes, uncomplicated: Secondary | ICD-10-CM | POA: Diagnosis not present

## 2016-11-24 DIAGNOSIS — Z1159 Encounter for screening for other viral diseases: Secondary | ICD-10-CM | POA: Diagnosis not present

## 2016-11-24 DIAGNOSIS — M1990 Unspecified osteoarthritis, unspecified site: Secondary | ICD-10-CM | POA: Diagnosis not present

## 2016-11-24 DIAGNOSIS — J449 Chronic obstructive pulmonary disease, unspecified: Secondary | ICD-10-CM | POA: Diagnosis not present

## 2016-11-24 DIAGNOSIS — E559 Vitamin D deficiency, unspecified: Secondary | ICD-10-CM | POA: Diagnosis not present

## 2016-11-24 DIAGNOSIS — E119 Type 2 diabetes mellitus without complications: Secondary | ICD-10-CM | POA: Diagnosis not present

## 2016-11-24 DIAGNOSIS — E781 Pure hyperglyceridemia: Secondary | ICD-10-CM | POA: Diagnosis not present

## 2016-11-24 DIAGNOSIS — G4730 Sleep apnea, unspecified: Secondary | ICD-10-CM | POA: Diagnosis not present

## 2016-12-16 ENCOUNTER — Ambulatory Visit: Payer: 59 | Attending: Internal Medicine

## 2016-12-16 DIAGNOSIS — G4733 Obstructive sleep apnea (adult) (pediatric): Secondary | ICD-10-CM | POA: Diagnosis not present

## 2016-12-30 DIAGNOSIS — G4733 Obstructive sleep apnea (adult) (pediatric): Secondary | ICD-10-CM | POA: Diagnosis not present

## 2017-01-05 DIAGNOSIS — R7309 Other abnormal glucose: Secondary | ICD-10-CM | POA: Diagnosis not present

## 2017-01-05 DIAGNOSIS — E119 Type 2 diabetes mellitus without complications: Secondary | ICD-10-CM | POA: Diagnosis not present

## 2017-01-05 DIAGNOSIS — I10 Essential (primary) hypertension: Secondary | ICD-10-CM | POA: Diagnosis not present

## 2017-01-05 DIAGNOSIS — D649 Anemia, unspecified: Secondary | ICD-10-CM | POA: Diagnosis not present

## 2017-01-05 DIAGNOSIS — F17210 Nicotine dependence, cigarettes, uncomplicated: Secondary | ICD-10-CM | POA: Diagnosis not present

## 2017-01-05 DIAGNOSIS — J449 Chronic obstructive pulmonary disease, unspecified: Secondary | ICD-10-CM | POA: Diagnosis not present

## 2017-03-07 ENCOUNTER — Other Ambulatory Visit: Payer: Self-pay | Admitting: Physician Assistant

## 2017-05-03 DIAGNOSIS — F17210 Nicotine dependence, cigarettes, uncomplicated: Secondary | ICD-10-CM | POA: Diagnosis not present

## 2017-05-03 DIAGNOSIS — I10 Essential (primary) hypertension: Secondary | ICD-10-CM | POA: Diagnosis not present

## 2017-05-03 DIAGNOSIS — J449 Chronic obstructive pulmonary disease, unspecified: Secondary | ICD-10-CM | POA: Diagnosis not present

## 2017-05-03 DIAGNOSIS — E781 Pure hyperglyceridemia: Secondary | ICD-10-CM | POA: Diagnosis not present

## 2017-05-03 DIAGNOSIS — R7309 Other abnormal glucose: Secondary | ICD-10-CM | POA: Diagnosis not present

## 2017-05-03 DIAGNOSIS — E119 Type 2 diabetes mellitus without complications: Secondary | ICD-10-CM | POA: Diagnosis not present

## 2017-05-03 DIAGNOSIS — G4730 Sleep apnea, unspecified: Secondary | ICD-10-CM | POA: Diagnosis not present

## 2017-05-03 DIAGNOSIS — E559 Vitamin D deficiency, unspecified: Secondary | ICD-10-CM | POA: Diagnosis not present

## 2017-05-03 DIAGNOSIS — D649 Anemia, unspecified: Secondary | ICD-10-CM | POA: Diagnosis not present

## 2017-05-16 ENCOUNTER — Telehealth: Payer: Self-pay | Admitting: Internal Medicine

## 2017-05-16 MED ORDER — UMECLIDINIUM BROMIDE 62.5 MCG/INH IN AEPB: 1.0000 | INHALATION_SPRAY | Freq: Every day | RESPIRATORY_TRACT | 5 refills | Status: DC

## 2017-05-16 NOTE — Telephone Encounter (Signed)
Incruse sent to patient pharmacy. Nothing further needed.

## 2017-05-16 NOTE — Telephone Encounter (Signed)
°*  STAT* If patient is at the pharmacy, call can be transferred to refill team.   1. Which medications need to be refilled? (please list name of each medication and dose if known) INCRUSE   2. Which pharmacy/location (including street and city if local pharmacy) is medication to be sent to? [facility]   3. Do they need a 30 day or 90 day supply? 90 day

## 2017-05-16 NOTE — Telephone Encounter (Signed)
It looks like the patient received this medication in the hospital I do not think we prescribe it.

## 2017-06-01 ENCOUNTER — Telehealth: Payer: Self-pay | Admitting: *Deleted

## 2017-06-01 NOTE — Telephone Encounter (Signed)
Left message for patient to notify them that it is time to schedule annual low dose lung cancer screening CT scan. Instructed patient to call back to verify information prior to the scan being scheduled.  

## 2017-06-07 ENCOUNTER — Encounter: Payer: Self-pay | Admitting: Internal Medicine

## 2017-06-07 ENCOUNTER — Ambulatory Visit (INDEPENDENT_AMBULATORY_CARE_PROVIDER_SITE_OTHER): Payer: 59 | Admitting: Internal Medicine

## 2017-06-07 VITALS — BP 128/88 | HR 95 | Resp 16 | Ht 63.0 in | Wt 194.0 lb

## 2017-06-07 DIAGNOSIS — G4733 Obstructive sleep apnea (adult) (pediatric): Secondary | ICD-10-CM | POA: Diagnosis not present

## 2017-06-07 NOTE — Progress Notes (Signed)
PULMONARY OFFICE FOLLOW UP NOTE  PROBLEMS:  Mild COPD Smoker  DATA: LDCT 06/07/16: normal PFTs 06/13/16: mild obstruction with borderline significant improvement after bronchodilator. Normal TLC. Mildly reduced DLCO  INTERVAL HISTORY: Previously seen by VM and diagnosed with mild COPD.   SUBJ: Dx with OSA States CPAP not giving her much air I will compliance report  No signs of infection No signs of heart failure Still smokes   BP 128/88 (BP Location: Left Arm, Cuff Size: Normal)   Pulse 95   Resp 16   Ht 5\' 3"  (1.6 m)   Wt 194 lb (88 kg)   SpO2 97%   BMI 34.37 kg/m    Review of Systems:  Gen:  Denies  fever, sweats, chills weigh loss  HEENT: Denies blurred vision, double vision, ear pain, eye pain, hearing loss, nose bleeds, sore throat Cardiac:  No dizziness, chest pain or heaviness, chest tightness,edema Resp:   Denies cough or sputum porduction, shortness of breath,wheezing, hemoptysis,  Other:  All other systems negative     Gen: NAD HEENT: WNL Neck: NO LAN, no JVD noted Lungs: full BS, normal percussion note, no wheezes or other adventitious sounds Cardiovascular: Reg, no M noted Abdomen: Soft, NT +BS Ext: no C/C/E Neuro: CNs intact, motor/sens grossly intact Skin: No lesions noted   DATA:   CXR 09/24/16: vague left upper lobe opacity   CXR 11/10/16 : resolution of LUL opacity   IMPRESSION: 66 yo AAF with MICL COPD gold stage A with OSA with ongoing smoking   PLAN:  1.continue Incruse and Symbicort 2.STOP SMOKING  3.albuterol as needed 4.Robitussin with Codeine 5.continue CPAP therapy as prescribed but I will need compliance report to assess adjustments if needed    Patient satisfied with Plan of action and management. All questions answered  Monica Oliver, M.D.  [facility]  Medical Director [place] Director [facility] Cardio-Pulmonary Department

## 2017-06-07 NOTE — Patient Instructions (Addendum)
Continue CPAP therapy I will need compliance report

## 2017-06-21 ENCOUNTER — Telehealth: Payer: Self-pay | Admitting: *Deleted

## 2017-06-21 DIAGNOSIS — Z87891 Personal history of nicotine dependence: Secondary | ICD-10-CM

## 2017-06-21 DIAGNOSIS — Z122 Encounter for screening for malignant neoplasm of respiratory organs: Secondary | ICD-10-CM

## 2017-06-21 NOTE — Telephone Encounter (Signed)
Notified patient that annual lung cancer screening low dose CT scan is due currently or will be in near future. Confirmed that patient is within the age range of 55-77, and asymptomatic, (no signs or symptoms of lung cancer). Patient denies illness that would prevent curative treatment for lung cancer if found. Verified smoking history, (current, 30.75 pack year). The shared decision making visit was done 06/07/16. Patient is agreeable for CT scan being scheduled.

## 2017-07-04 ENCOUNTER — Other Ambulatory Visit: Payer: Self-pay | Admitting: Family Medicine

## 2017-07-04 DIAGNOSIS — Z1231 Encounter for screening mammogram for malignant neoplasm of breast: Secondary | ICD-10-CM

## 2017-07-06 ENCOUNTER — Ambulatory Visit
Admission: RE | Admit: 2017-07-06 | Discharge: 2017-07-06 | Disposition: A | Discharge status: Home / Self Care | Payer: 59 | Source: Ambulatory Visit | Attending: Oncology | Admitting: Oncology

## 2017-07-06 DIAGNOSIS — I700 Atherosclerosis of aorta: Secondary | ICD-10-CM | POA: Diagnosis not present

## 2017-07-06 DIAGNOSIS — Z122 Encounter for screening for malignant neoplasm of respiratory organs: Secondary | ICD-10-CM | POA: Insufficient documentation

## 2017-07-06 DIAGNOSIS — Z87891 Personal history of nicotine dependence: Secondary | ICD-10-CM | POA: Diagnosis not present

## 2017-07-10 ENCOUNTER — Encounter: Payer: Self-pay | Admitting: *Deleted

## 2017-07-26 ENCOUNTER — Ambulatory Visit
Admission: RE | Admit: 2017-07-26 | Discharge: 2017-07-26 | Disposition: A | Discharge status: Home / Self Care | Payer: 59 | Source: Ambulatory Visit | Attending: Family Medicine | Admitting: Family Medicine

## 2017-07-26 DIAGNOSIS — Z1231 Encounter for screening mammogram for malignant neoplasm of breast: Secondary | ICD-10-CM | POA: Diagnosis not present

## 2017-08-08 DIAGNOSIS — J449 Chronic obstructive pulmonary disease, unspecified: Secondary | ICD-10-CM | POA: Diagnosis not present

## 2017-08-08 DIAGNOSIS — G4730 Sleep apnea, unspecified: Secondary | ICD-10-CM | POA: Diagnosis not present

## 2017-08-08 DIAGNOSIS — D649 Anemia, unspecified: Secondary | ICD-10-CM | POA: Diagnosis not present

## 2017-08-08 DIAGNOSIS — E119 Type 2 diabetes mellitus without complications: Secondary | ICD-10-CM | POA: Diagnosis not present

## 2017-08-08 DIAGNOSIS — I10 Essential (primary) hypertension: Secondary | ICD-10-CM | POA: Diagnosis not present

## 2017-08-08 DIAGNOSIS — E781 Pure hyperglyceridemia: Secondary | ICD-10-CM | POA: Diagnosis not present

## 2017-08-08 DIAGNOSIS — F419 Anxiety disorder, unspecified: Secondary | ICD-10-CM | POA: Diagnosis not present

## 2017-08-08 DIAGNOSIS — F17210 Nicotine dependence, cigarettes, uncomplicated: Secondary | ICD-10-CM | POA: Diagnosis not present

## 2017-08-08 DIAGNOSIS — Z23 Encounter for immunization: Secondary | ICD-10-CM | POA: Diagnosis not present

## 2017-09-08 ENCOUNTER — Ambulatory Visit: Payer: Self-pay | Admitting: Emergency Medicine

## 2017-09-08 VITALS — BP 130/80 | HR 86 | Temp 98.6°F

## 2017-09-08 DIAGNOSIS — B9789 Other viral agents as the cause of diseases classified elsewhere: Principal | ICD-10-CM

## 2017-09-08 DIAGNOSIS — J069 Acute upper respiratory infection, unspecified: Secondary | ICD-10-CM

## 2017-09-08 MED ORDER — PSEUDOEPH-BROMPHEN-DM 30-2-10 MG/5ML PO SYRP: 5.0000 mL | ORAL_SOLUTION | Freq: Four times a day (QID) | ORAL | 0 refills | Status: DC | PRN

## 2017-09-08 NOTE — Progress Notes (Signed)
S: Is here with complaint of cough and congestion for 2 days.  Patient states she is also had nasal congestion.  Patient is a smoker at 1/3 pack of cigarettes per day.  She has taken some over-the-counter TheraFlu infrequently with some minimal relief.  She denies any fever or chills.  Currently she is worried that she "will get pneumonia". O: EACs are clear.  TMs are dull bilaterally.  Nasal mucosa boggy and pale.  Posterior pharynx moderate injection and posterior drainage seen.  Neck is supple without adenopathy.  Nontender sinuses to percussion.  Lungs are clear bilaterally.  Heart regular rate and rhythm. A: Viral upper respiratory infection P: We discussed discontinuing smoking which would help her greatly.  Increase fluids.  Bromfed-DM 4 times daily as needed for cough and congestion.

## 2017-09-15 ENCOUNTER — Ambulatory Visit: Payer: Self-pay | Admitting: Nurse Practitioner

## 2017-09-15 VITALS — BP 135/80 | HR 97 | Temp 98.5°F | Resp 16

## 2017-09-15 DIAGNOSIS — J209 Acute bronchitis, unspecified: Secondary | ICD-10-CM

## 2017-09-15 MED ORDER — BENZONATATE 100 MG PO CAPS: 100.0000 mg | ORAL_CAPSULE | Freq: Two times a day (BID) | ORAL | 0 refills | Status: DC | PRN

## 2017-09-15 MED ORDER — PREDNISONE 10 MG (21) PO TBPK: ORAL_TABLET | ORAL | 0 refills | Status: AC

## 2017-09-15 MED ORDER — DEXTROMETHORPHAN HBR 15 MG/5ML PO SYRP: 5.0000 mL | ORAL_SOLUTION | Freq: Four times a day (QID) | ORAL | 0 refills | Status: AC | PRN

## 2017-09-15 NOTE — Progress Notes (Signed)
Subjective:     Monica Oliver is a 66 y.o. female here for evaluation of a cough. Onset of symptoms was 9 days ago. Symptoms have been unchanged since that time. The cough is productive of yellow sputum and is aggravated by nothing. Associated symptoms include: sputum production. Patient does not have a history of asthma. Patient does not have a history of environmental allergens. Patient has not traveled recently. Patient does have a history of smoking.  Patient was seen in the clinic on 12/14 and given Bromfed.  Patient states she is unable to sleep at night from coughing but does endorse continued smoking.  Patient also endorses hx of PNA.  The following portions of the patient's history were reviewed and updated as appropriate: allergies, current medications and past medical history.  Review of Systems Constitutional: positive for fatigue Eyes: negative Ears, nose, mouth, throat, and face: negative Respiratory: positive for cough Cardiovascular: negative Neurological: negative Behavioral/Psych: negative Allergic/Immunologic: negative    Objective:     BP 135/80   Pulse 97   Temp 98.5 F (36.9 C) (Oral)   Resp 16   SpO2 96%  General appearance: alert, cooperative and fatigued Head: Normocephalic, without obvious abnormality, atraumatic Eyes: conjunctivae/corneas clear. PERRL, EOM's intact. Fundi benign. Ears: normal TM's and external ear canals both ears Nose: Nares normal. Septum midline. Mucosa normal. No drainage or sinus tenderness. Throat: lips, mucosa, and tongue normal; teeth and gums normal Lungs: clear to auscultation bilaterally Heart: regular rate and rhythm, S1, S2 normal, no murmur, click, rub or gallop Skin: Skin color, texture, turgor normal. No rashes or lesions Lymph nodes: Cervical, supraclavicular, and axillary nodes normal. and cervical nodes normal Neurologic: Grossly normal    Assessment:    Acute Bronchitis    Plan:    Explained lack of efficacy of  antibiotics in viral disease. Antitussives per medication orders. Avoid exposure to tobacco smoke and fumes. Call if shortness of breath worsens, blood in sputum, change in character of cough, development of fever or chills, inability to maintain nutrition and hydration. Avoid exposure to tobacco smoke and fumes.  Patient instructed to follow up with pulmonologist if symptoms do not improve.  Discussed at length the importance of smoking cessation and how it will impact her condition.

## 2017-10-23 ENCOUNTER — Encounter: Payer: Self-pay | Admitting: Internal Medicine

## 2017-10-23 ENCOUNTER — Ambulatory Visit: Payer: 59 | Admitting: Internal Medicine

## 2017-10-23 ENCOUNTER — Other Ambulatory Visit: Payer: Self-pay | Admitting: *Deleted

## 2017-10-23 VITALS — BP 128/80 | HR 90 | Resp 16 | Ht 63.0 in | Wt 194.0 lb

## 2017-10-23 DIAGNOSIS — G4733 Obstructive sleep apnea (adult) (pediatric): Secondary | ICD-10-CM | POA: Diagnosis not present

## 2017-10-23 DIAGNOSIS — J441 Chronic obstructive pulmonary disease with (acute) exacerbation: Secondary | ICD-10-CM | POA: Diagnosis not present

## 2017-10-23 MED ORDER — VARENICLINE TARTRATE 0.5 MG X 11 & 1 MG X 42 PO MISC: ORAL | 0 refills | Status: DC

## 2017-10-23 MED ORDER — AZITHROMYCIN 250 MG PO TABS: ORAL_TABLET | ORAL | 0 refills | Status: DC

## 2017-10-23 MED ORDER — PREDNISONE 20 MG PO TABS: 40.0000 mg | ORAL_TABLET | Freq: Every day | ORAL | 0 refills | Status: DC

## 2017-10-23 MED ORDER — TIOTROPIUM BROMIDE MONOHYDRATE 1.25 MCG/ACT IN AERS: 1.0000 | INHALATION_SPRAY | Freq: Every day | RESPIRATORY_TRACT | 5 refills | Status: DC

## 2017-10-23 MED ORDER — TIOTROPIUM BROMIDE MONOHYDRATE 1.25 MCG/ACT IN AERS: 2.0000 | INHALATION_SPRAY | Freq: Every day | RESPIRATORY_TRACT | 5 refills | Status: DC

## 2017-10-23 MED ORDER — CHANTIX 1 MG PO TABS: 1.0000 mg | ORAL_TABLET | Freq: Two times a day (BID) | ORAL | 2 refills | Status: DC | PRN

## 2017-10-23 MED ORDER — ALBUTEROL SULFATE (2.5 MG/3ML) 0.083% IN NEBU: 2.5000 mg | INHALATION_SOLUTION | Freq: Four times a day (QID) | RESPIRATORY_TRACT | 5 refills | Status: DC | PRN

## 2017-10-23 NOTE — Patient Instructions (Signed)
z pak Change to Spiriva

## 2017-10-23 NOTE — Progress Notes (Signed)
PULMONARY OFFICE FOLLOW UP NOTE  PROBLEMS:  Mild COPD Smoker  DATA: LDCT 06/07/16: normal PFTs 06/13/16: mild obstruction with borderline significant improvement after bronchodilator. Normal TLC. Mildly reduced DLCO  INTERVAL HISTORY: Previously seen by VM and diagnosed with mild COPD.   SUBJ: Dx with OSA States CPAP is doing well compliance report shows 100% compliance 97% >4 hrs AHI 0.7  +chest congestion +productive cough No signs of heart failure Still smokes-smoking cessation advised  BP 128/80 (BP Location: Left Arm, Cuff Size: Normal)   Pulse 90   Resp 16   Ht 5\' 3"  (1.6 m)   Wt 194 lb (88 kg)   SpO2 95%   BMI 34.37 kg/m    Review of Systems:  Gen:  Denies  fever, sweats, chills weigh loss  HEENT: Denies blurred vision, double vision, ear pain, eye pain, hearing loss, nose bleeds, sore throat Cardiac:  No dizziness, chest pain or heaviness, chest tightness,edema Resp:   +cough +sputum porduction, +shortness of breath,+wheezing, -hemoptysis,  Other:  All other systems negative     Gen: NAD HEENT: WNL Neck: NO LAN, no JVD noted Lungs: full BS, normal percussion note, no wheezes or other adventitious sounds Cardiovascular: Reg, no M noted Abdomen: Soft, NT +BS Ext: no C/C/E Neuro: CNs intact, motor/sens grossly intact Skin: No lesions noted   DATA:   CXR 09/24/16: vague left upper lobe opacity   CXR 11/10/16 : resolution of LUL opacity   IMPRESSION: 67 yo AAF with MICL COPD gold stage A with OSA with ongoing smoking With  COPD exacarbation  PLAN:  1.continue Symbicort, change incruse to spiriva respimat 1.25 2.STOP SMOKING  3.albuterol as needed 4.Robitussin with Codeine 5.continue CPAP therapy as prescribed  6.z pak 7.prednisone 40 mg daily for 10 days    Patient satisfied with Plan of action and management. All questions answered  Corrin Parker, M.D.  [facility]  Medical Director [place] Director [facility] Cardio-Pulmonary Department

## 2017-11-28 ENCOUNTER — Emergency Department: Payer: 59

## 2017-11-28 ENCOUNTER — Other Ambulatory Visit: Payer: Self-pay

## 2017-11-28 ENCOUNTER — Emergency Department
Admission: EM | Admit: 2017-11-28 | Discharge: 2017-11-28 | Disposition: A | Discharge status: Home / Self Care | Payer: 59 | Attending: Emergency Medicine | Admitting: Emergency Medicine

## 2017-11-28 DIAGNOSIS — R0789 Other chest pain: Secondary | ICD-10-CM | POA: Diagnosis not present

## 2017-11-28 DIAGNOSIS — F17210 Nicotine dependence, cigarettes, uncomplicated: Secondary | ICD-10-CM | POA: Insufficient documentation

## 2017-11-28 DIAGNOSIS — J449 Chronic obstructive pulmonary disease, unspecified: Secondary | ICD-10-CM | POA: Insufficient documentation

## 2017-11-28 DIAGNOSIS — Z7982 Long term (current) use of aspirin: Secondary | ICD-10-CM | POA: Insufficient documentation

## 2017-11-28 DIAGNOSIS — J45909 Unspecified asthma, uncomplicated: Secondary | ICD-10-CM | POA: Diagnosis not present

## 2017-11-28 DIAGNOSIS — Z79899 Other long term (current) drug therapy: Secondary | ICD-10-CM | POA: Diagnosis not present

## 2017-11-28 DIAGNOSIS — E119 Type 2 diabetes mellitus without complications: Secondary | ICD-10-CM | POA: Diagnosis not present

## 2017-11-28 DIAGNOSIS — I10 Essential (primary) hypertension: Secondary | ICD-10-CM | POA: Diagnosis not present

## 2017-11-28 DIAGNOSIS — R079 Chest pain, unspecified: Secondary | ICD-10-CM | POA: Diagnosis not present

## 2017-11-28 DIAGNOSIS — Z7984 Long term (current) use of oral hypoglycemic drugs: Secondary | ICD-10-CM | POA: Diagnosis not present

## 2017-11-28 LAB — CBC
HCT: 45.6 % (ref 35.0–47.0)
Hemoglobin: 15.2 g/dL (ref 12.0–16.0)
MCH: 29 pg (ref 26.0–34.0)
MCHC: 33.3 g/dL (ref 32.0–36.0)
MCV: 86.8 fL (ref 80.0–100.0)
PLATELETS: 250 10*3/uL (ref 150–440)
RBC: 5.25 MIL/uL — AB (ref 3.80–5.20)
RDW: 15.8 % — AB (ref 11.5–14.5)
WBC: 11.6 10*3/uL — ABNORMAL HIGH (ref 3.6–11.0)

## 2017-11-28 LAB — TROPONIN I: Troponin I: <0.03 ng/mL (ref <0.03)

## 2017-11-28 LAB — BASIC METABOLIC PANEL
Anion gap: 12 (ref 5–15)
BUN: 17 mg/dL (ref 6–20)
CALCIUM: 9.6 mg/dL (ref 8.9–10.3)
CHLORIDE: 96 mmol/L — AB (ref 101–111)
CO2: 26 mmol/L (ref 22–32)
CREATININE: 1.15 mg/dL — AB (ref 0.44–1.00)
GFR, EST AFRICAN AMERICAN: 57 mL/min — AB (ref >60)
GFR, EST NON AFRICAN AMERICAN: 49 mL/min — AB (ref >60)
Glucose, Bld: 91 mg/dL (ref 65–99)
Potassium: 3.5 mmol/L (ref 3.5–5.1)
SODIUM: 134 mmol/L — AB (ref 135–145)

## 2017-11-28 NOTE — ED Notes (Signed)
Pt stating that she has had n/v since yesterday along with some chest pressure. Pt stating denying any SOB or dizziness at this time. Pt stating that she was concerned because of her age and "know woman present with weird symptoms." Pt is ambulatory when nurse arrived to room and in NAD. Pt placed onto cardiac monitor, VS WNL.

## 2017-11-28 NOTE — ED Provider Notes (Signed)
[facility] Emergency Department Provider Note   ____________________________________________   First MD Initiated Contact with Patient 11/28/17 1715     (approximate)  I have reviewed the triage vital signs and the nursing notes.   HISTORY  Chief Complaint Chest Pain    HPI Monica Oliver is a 67 y.o. female Reports since yesterday she's had nausea and vomiting along with some chest pressure. The chest pressure is brief last seconds or less goes away comes back after a few minutes. She reports she gets short of breath when she exerts herself. She is not short of breath chest pressure but she does get some sweatiness with it. She denies any pain on palpation of her chest and has no belly pain. She has not vomited since last night but is still nauseated intermittently.   Past Medical History:  Diagnosis Date  . Allergy    environmental  . Arthritis   . Asthma   . COPD (chronic obstructive pulmonary disease) ([place])   . Diabetes mellitus without complication ([place])   . GERD (gastroesophageal reflux disease)   . Hyperlipidemia   . Hypertension   . Sleep apnea    C-Pap machine    Patient Active Problem List   Diagnosis Date Noted  . Personal history of tobacco use, presenting hazards to health 06/07/2016  . COPD (chronic obstructive pulmonary disease) ([place]) 04/18/2016  . DOE (dyspnea on exertion) 04/18/2016  . Tobacco abuse counseling 04/18/2016  . Smoker 04/18/2016  . OSA on CPAP 04/18/2016  . Diabetes ([place]) 05/04/2015    Past Surgical History:  Procedure Laterality Date  . CESAREAN SECTION     1986  . CHOLECYSTECTOMY     1993    Prior to Admission medications   Medication Sig Start Date End Date Taking? Authorizing Provider  albuterol (PROVENTIL HFA;VENTOLIN HFA) 108 (90 Base) MCG/ACT inhaler Inhale 1 puff into the lungs every 6 (six) hours as needed. 06/23/16   Mungal, Roxanne Mins, MD  albuterol (PROVENTIL) (2.5 MG/3ML) 0.083% nebulizer  solution Take 3 mLs (2.5 mg total) by nebulization every 6 (six) hours as needed for wheezing or shortness of breath. 10/23/17   Flora Lipps, MD  aspirin 81 MG tablet Take 81 mg by mouth daily. 1 tablet daily    [provider]  atenolol (TENORMIN) 25 MG tablet Take 25 mg by mouth daily. 1 tablet once a day    [provider]  atorvastatin (LIPITOR) 40 MG tablet Take 40 mg by mouth daily. 1 tab per day    [provider]  azithromycin (ZITHROMAX Z-PAK) 250 MG tablet Take 2 tablets on Day 1 and then 1 tablet daily till gone. 10/23/17   Flora Lipps, MD  budesonide-formoterol (SYMBICORT) 160-4.5 MCG/ACT inhaler Inhale 2 puffs into the lungs 2 (two) times daily. 06/23/16   Vilinda Boehringer, MD  cetirizine (ZYRTEC) 10 MG tablet Take 10 mg by mouth daily as needed.    [provider]  fluticasone (FLONASE) 50 MCG/ACT nasal spray Place 1 spray into both nostrils daily. As needed 03/03/16   Caryn Section Linden Dolin, PA-C  lisinopril-hydrochlorothiazide (PRINZIDE,ZESTORETIC) 20-12.5 MG per tablet Take 1 tablet by mouth daily.    [provider]  metFORMIN (GLUCOPHAGE) 500 MG tablet Take 500 mg by mouth daily. Extended release    [provider]  naproxen (NAPROSYN) 500 MG tablet TAKE 1 TABLET BY MOUTH 2 TIMES DAILY WITH A MEAL 03/07/17   Blima Singer, NP  omeprazole (PRILOSEC) 20 MG capsule  Take 20 mg by mouth daily. prn    [provider]  predniSONE (DELTASONE) 20 MG tablet Take 2 tablets (40 mg total) by mouth daily with breakfast. 10 days 10/23/17   Flora Lipps, MD  Tiotropium Bromide Monohydrate (SPIRIVA RESPIMAT) 1.25 MCG/ACT AERS Inhale 2 puffs into the lungs daily. 10/23/17   Flora Lipps, MD  TRUE METRIX BLOOD GLUCOSE TEST test strip  02/19/16   [provider]  umeclidinium bromide (INCRUSE ELLIPTA) 62.5 MCG/INH AEPB Inhale 1 puff into the lungs daily. 05/16/17   Flora Lipps, MD  varenicline (CHANTIX STARTING MONTH PAK) 0.5 MG X 11 & 1 MG  X 42 tablet Take one 0.5 mg tablet by mouth once daily for 3 days, then increase to one 0.5 mg tablet twice daily for 4 days, then increase to one 1 mg tablet twice daily. 10/23/17   Flora Lipps, MD    Allergies Penicillins  Family History  Problem Relation Age of Onset  . Hypertension Mother   . Cancer Mother        lung  . Cancer Father        prostate  . Hyperlipidemia Father   . Heart disease Father   . Cancer Brother        prostate  . Cirrhosis Sister   . Hepatitis Sister   . Breast cancer Paternal Aunt     Social History Social History   Tobacco Use  . Smoking status: Current Every Day Smoker    Packs/day: 0.25    Years: 40.00    Pack years: 10.00    Types: Cigarettes    Start date: 12/15/1971  . Smokeless tobacco: Never Used  . Tobacco comment: feb.2017- tried Chantix- made her nauseous. Has the nicotine patch- not started  Substance Use Topics  . Alcohol use: No    Alcohol/week: 0.0 oz  . Drug use: No    Review of Systems  Constitutional: No fever/chills Eyes: No visual changes. ENT: No sore throat. Cardiovascular: see history of present illness Respiratory:see history of present illness Gastrointestinal: No abdominal pain.   nausea,vomiting.  No diarrhea.  No constipation. Genitourinary: Negative for dysuria. Musculoskeletal: Negative for back pain. Skin: Negative for rash. Neurological: Negative for headaches, focal weakness  ____________________________________________   PHYSICAL EXAM:  VITAL SIGNS: ED Triage Vitals  Enc Vitals Group     BP 11/28/17 1646 (!) 142/67     Pulse Rate 11/28/17 1646 83     Resp 11/28/17 1646 18     Temp 11/28/17 1646 97.9 F (36.6 C)     Temp Source 11/28/17 1646 Oral     SpO2 11/28/17 1646 98 %     Weight 11/28/17 1641 194 lb (88 kg)     Height --      Head Circumference --      Peak Flow --      Pain Score 11/28/17 1641 5     Pain Loc --      Pain Edu? --      Excl. in [place]? --     Constitutional:  Alert and oriented. Well appearing and in no acute distress. Eyes: Conjunctivae are normal. Head: Atraumatic. Nose: No congestion/rhinnorhea. Mouth/Throat: Mucous membranes are moist.  Oropharynx non-erythematous. Neck: No stridor. Cardiovascular: Normal rate, regular rhythm. Grossly normal heart sounds.  Good peripheral circulation. Respiratory: Normal respiratory effort.  No retractions. Lungs CTAB. Gastrointestinal: Soft and nontender. No distention. No abdominal bruits. No CVA tenderness. Musculoskeletal: No lower extremity tenderness nor edema.  No joint effusions. Neurologic:  Normal speech and language. No gross focal neurologic deficits are appreciated. . Skin:  Skin is warm, dry and intact. No rash noted. Psychiatric: Mood and affect are normal. Speech and behavior are normal.  ____________________________________________   LABS (all labs ordered are listed, but only abnormal results are displayed)  Labs Reviewed  BASIC METABOLIC PANEL - Abnormal; Notable for the following components:      Result Value   Sodium 134 (*)    Chloride 96 (*)    Creatinine, Ser 1.15 (*)    GFR calc non Af Amer 49 (*)    GFR calc Af Amer 57 (*)    All other components within normal limits  CBC - Abnormal; Notable for the following components:   WBC 11.6 (*)    RBC 5.25 (*)    RDW 15.8 (*)    All other components within normal limits  TROPONIN I  TROPONIN I   ____________________________________________  EKG EKG read and interpreted by me shows normal sinus rhythm rate of 85 left axis nonspecific ST-T wave changes is similar to one from 09/24/2016 ____________________________________________  RADIOLOGY  ED MD interpretation: chest x-ray read by radiology as no acute disease I reviewed the film and agree  Official radiology report(s): Dg Chest 2 View  Result Date: 11/28/2017 CLINICAL DATA:  Chest pain/heaviness and nausea beginning yesterday. EXAM: CHEST - 2 VIEW COMPARISON:  Chest CT  07/06/2017 and radiographs 11/10/2016 FINDINGS: The cardiomediastinal silhouette is within normal limits. There is mild chronic interstitial coarsening. Minimal scarring or atelectasis is noted in the left lung base. There is no evidence of airspace consolidation, overt edema, pleural effusion, pneumothorax. Right upper quadrant abdominal surgical clips are noted. No acute osseous abnormality is seen. IMPRESSION: No active cardiopulmonary disease. Electronically Signed   By: Logan Bores M.D.   On: 11/28/2017 17:13    ____________________________________________   PROCEDURES  Procedure(s) performed:   Procedures  Critical Care performed:   ____________________________________________   INITIAL IMPRESSION / ASSESSMENT AND PLAN / ED COURSE  patient's troponins are negative chest x-ray is negative history does not sound like cardiac pain at all. I will let the patient go with follow-up with cardiology just in case.         ____________________________________________   FINAL CLINICAL IMPRESSION(S) / ED DIAGNOSES  Final diagnoses:  Nonspecific chest pain     ED Discharge Orders    None       Note:  This document was prepared using Dragon voice recognition software and may include unintentional dictation errors.    Nena Polio, MD 11/28/17 [phone]

## 2017-11-28 NOTE — ED Notes (Signed)
Pt given warm blanket and is helped with repositioning. Pt in NAD at this time and is laying down resting her eyes until her test results are back.

## 2017-11-28 NOTE — Discharge Instructions (Signed)
the tests that we've done today are not showing the cause of your chest pain. Please return here if your chest pain comes on especially if it lasts for 5 or 10 minutes before goes away. Please follow-up with Dr. Clayborn Bigness, the cardiologist. Give his office call in the morning they should be on to see within the next day or 2.

## 2017-11-28 NOTE — ED Triage Notes (Signed)
Central chest heaviness and nausea that began yesterday. Pt alert and oriented X4, active, cooperative, pt in NAD. RR even and unlabored, color WNL.

## 2017-12-28 ENCOUNTER — Other Ambulatory Visit: Payer: Self-pay | Admitting: Internal Medicine

## 2017-12-28 MED ORDER — BUDESONIDE-FORMOTEROL FUMARATE 160-4.5 MCG/ACT IN AERO: 2.0000 | INHALATION_SPRAY | Freq: Two times a day (BID) | RESPIRATORY_TRACT | 5 refills | Status: DC

## 2018-01-04 DIAGNOSIS — Z7689 Persons encountering health services in other specified circumstances: Secondary | ICD-10-CM | POA: Diagnosis not present

## 2018-01-04 DIAGNOSIS — G4730 Sleep apnea, unspecified: Secondary | ICD-10-CM | POA: Diagnosis not present

## 2018-01-04 DIAGNOSIS — F17210 Nicotine dependence, cigarettes, uncomplicated: Secondary | ICD-10-CM | POA: Diagnosis not present

## 2018-01-04 DIAGNOSIS — Z862 Personal history of diseases of the blood and blood-forming organs and certain disorders involving the immune mechanism: Secondary | ICD-10-CM | POA: Diagnosis not present

## 2018-01-04 DIAGNOSIS — I10 Essential (primary) hypertension: Secondary | ICD-10-CM | POA: Diagnosis not present

## 2018-01-04 DIAGNOSIS — J449 Chronic obstructive pulmonary disease, unspecified: Secondary | ICD-10-CM | POA: Diagnosis not present

## 2018-01-04 DIAGNOSIS — E781 Pure hyperglyceridemia: Secondary | ICD-10-CM | POA: Diagnosis not present

## 2018-01-04 DIAGNOSIS — Z0000 Encounter for general adult medical examination without abnormal findings: Secondary | ICD-10-CM | POA: Diagnosis not present

## 2018-01-04 DIAGNOSIS — D649 Anemia, unspecified: Secondary | ICD-10-CM | POA: Diagnosis not present

## 2018-01-04 DIAGNOSIS — E119 Type 2 diabetes mellitus without complications: Secondary | ICD-10-CM | POA: Diagnosis not present

## 2018-01-04 DIAGNOSIS — E559 Vitamin D deficiency, unspecified: Secondary | ICD-10-CM | POA: Diagnosis not present

## 2018-01-30 ENCOUNTER — Telehealth: Payer: Self-pay | Admitting: Internal Medicine

## 2018-01-30 ENCOUNTER — Other Ambulatory Visit: Payer: Self-pay | Admitting: Internal Medicine

## 2018-01-30 NOTE — Telephone Encounter (Signed)
Please advise on RX request. Pt was started on Chantix Starter pack 10/23/17.

## 2018-01-30 NOTE — Telephone Encounter (Signed)
Medication has been refilled by [facility]. Nothing further needed.

## 2018-01-30 NOTE — Telephone Encounter (Signed)
Patient wants to start chantix.  She has just started the smoking cessation class. Please send to [place]

## 2018-03-19 DIAGNOSIS — G4733 Obstructive sleep apnea (adult) (pediatric): Secondary | ICD-10-CM | POA: Diagnosis not present

## 2018-06-07 DIAGNOSIS — G4730 Sleep apnea, unspecified: Secondary | ICD-10-CM | POA: Diagnosis not present

## 2018-06-07 DIAGNOSIS — L988 Other specified disorders of the skin and subcutaneous tissue: Secondary | ICD-10-CM | POA: Diagnosis not present

## 2018-06-07 DIAGNOSIS — R3915 Urgency of urination: Secondary | ICD-10-CM | POA: Diagnosis not present

## 2018-06-07 DIAGNOSIS — J449 Chronic obstructive pulmonary disease, unspecified: Secondary | ICD-10-CM | POA: Diagnosis not present

## 2018-06-07 DIAGNOSIS — Z862 Personal history of diseases of the blood and blood-forming organs and certain disorders involving the immune mechanism: Secondary | ICD-10-CM | POA: Diagnosis not present

## 2018-06-07 DIAGNOSIS — E119 Type 2 diabetes mellitus without complications: Secondary | ICD-10-CM | POA: Diagnosis not present

## 2018-06-07 DIAGNOSIS — I10 Essential (primary) hypertension: Secondary | ICD-10-CM | POA: Diagnosis not present

## 2018-06-07 DIAGNOSIS — R079 Chest pain, unspecified: Secondary | ICD-10-CM | POA: Diagnosis not present

## 2018-06-07 DIAGNOSIS — E559 Vitamin D deficiency, unspecified: Secondary | ICD-10-CM | POA: Diagnosis not present

## 2018-06-07 DIAGNOSIS — F17210 Nicotine dependence, cigarettes, uncomplicated: Secondary | ICD-10-CM | POA: Diagnosis not present

## 2018-06-30 ENCOUNTER — Telehealth: Payer: Self-pay

## 2018-06-30 NOTE — Telephone Encounter (Signed)
Call pt regarding lung screening left message at 10:36/

## 2018-07-02 ENCOUNTER — Telehealth: Payer: Self-pay | Admitting: *Deleted

## 2018-07-02 DIAGNOSIS — Z122 Encounter for screening for malignant neoplasm of respiratory organs: Secondary | ICD-10-CM

## 2018-07-02 DIAGNOSIS — Z87891 Personal history of nicotine dependence: Secondary | ICD-10-CM

## 2018-07-02 NOTE — Telephone Encounter (Signed)
Patient has been notified that annual lung cancer screening low dose CT scan is due currently or will be in near future. Confirmed that patient is within the age range of 55-77, and asymptomatic, (no signs or symptoms of lung cancer). Patient denies illness that would prevent curative treatment for lung cancer if found. Verified smoking history, (current, 31.5 pack year). The shared decision making visit was done 06/07/16. Patient is agreeable for CT scan being scheduled.

## 2018-07-09 ENCOUNTER — Telehealth: Payer: Self-pay | Admitting: *Deleted

## 2018-07-09 NOTE — Telephone Encounter (Signed)
Call patient to inform her of her appt for ldct screening on 07-16-18 at 4:15 pm here at [place], message left, appt mailed to home

## 2018-07-11 ENCOUNTER — Other Ambulatory Visit: Payer: Self-pay | Admitting: Family Medicine

## 2018-07-11 DIAGNOSIS — Z1231 Encounter for screening mammogram for malignant neoplasm of breast: Secondary | ICD-10-CM

## 2018-07-16 ENCOUNTER — Ambulatory Visit
Admission: RE | Admit: 2018-07-16 | Discharge: 2018-07-16 | Disposition: A | Discharge status: Home / Self Care | Payer: 59 | Source: Ambulatory Visit | Attending: Nurse Practitioner | Admitting: Nurse Practitioner

## 2018-07-16 DIAGNOSIS — I2510 Atherosclerotic heart disease of native coronary artery without angina pectoris: Secondary | ICD-10-CM | POA: Insufficient documentation

## 2018-07-16 DIAGNOSIS — F17210 Nicotine dependence, cigarettes, uncomplicated: Secondary | ICD-10-CM | POA: Diagnosis not present

## 2018-07-16 DIAGNOSIS — Z122 Encounter for screening for malignant neoplasm of respiratory organs: Secondary | ICD-10-CM | POA: Insufficient documentation

## 2018-07-16 DIAGNOSIS — J439 Emphysema, unspecified: Secondary | ICD-10-CM | POA: Diagnosis not present

## 2018-07-16 DIAGNOSIS — Z87891 Personal history of nicotine dependence: Secondary | ICD-10-CM | POA: Insufficient documentation

## 2018-07-16 DIAGNOSIS — I700 Atherosclerosis of aorta: Secondary | ICD-10-CM | POA: Diagnosis not present

## 2018-07-17 ENCOUNTER — Telehealth: Payer: Self-pay | Admitting: Internal Medicine

## 2018-07-17 ENCOUNTER — Ambulatory Visit (INDEPENDENT_AMBULATORY_CARE_PROVIDER_SITE_OTHER): Payer: Self-pay | Admitting: Physician Assistant

## 2018-07-17 ENCOUNTER — Encounter: Payer: Self-pay | Admitting: *Deleted

## 2018-07-17 ENCOUNTER — Ambulatory Visit: Payer: 59 | Admitting: Internal Medicine

## 2018-07-17 ENCOUNTER — Encounter: Payer: Self-pay | Admitting: Physician Assistant

## 2018-07-17 ENCOUNTER — Encounter: Payer: Self-pay | Admitting: Internal Medicine

## 2018-07-17 VITALS — BP 130/90 | HR 72 | Temp 98.0°F | Wt 193.0 lb

## 2018-07-17 VITALS — BP 110/78 | HR 78 | Ht 63.0 in | Wt 192.0 lb

## 2018-07-17 DIAGNOSIS — B029 Zoster without complications: Secondary | ICD-10-CM

## 2018-07-17 DIAGNOSIS — Z23 Encounter for immunization: Secondary | ICD-10-CM

## 2018-07-17 DIAGNOSIS — Z716 Tobacco abuse counseling: Secondary | ICD-10-CM | POA: Diagnosis not present

## 2018-07-17 DIAGNOSIS — R918 Other nonspecific abnormal finding of lung field: Secondary | ICD-10-CM | POA: Diagnosis not present

## 2018-07-17 DIAGNOSIS — G4733 Obstructive sleep apnea (adult) (pediatric): Secondary | ICD-10-CM | POA: Diagnosis not present

## 2018-07-17 MED ORDER — FAMCICLOVIR 500 MG PO TABS: 500.0000 mg | ORAL_TABLET | Freq: Three times a day (TID) | ORAL | 0 refills | Status: AC

## 2018-07-17 MED ORDER — ACYCLOVIR 400 MG PO TABS: 400.0000 mg | ORAL_TABLET | Freq: Every day | ORAL | 0 refills | Status: AC

## 2018-07-17 MED ORDER — CAPSAICIN 0.025 % EX CREA: TOPICAL_CREAM | Freq: Two times a day (BID) | CUTANEOUS | 0 refills | Status: AC

## 2018-07-17 NOTE — Telephone Encounter (Signed)
Pt states she had a lung scan yesterday and pt would like to discuss this further in detail. STates her daughter is asking "if there is any bloodwork that can be done to detect cancer"

## 2018-07-17 NOTE — Patient Instructions (Signed)
Thank you for choosing [facility] for your health care needs.  You have been diagnosed with SHINGLES, also known as herpes zoster.  Take Famciclovir as prescribed. 1 pill, three times a day, x 7 days. Do not take Acyclovir.  May apply Zostrix (capsaicin cream) for pain relief.  May take previously prescribed Naproxen for pain relief. Take with food to prevent stomach upset. May also take over the counter Tylenol.  Return to [facility] if rash not improving in a few days, sooner with any issues or concerns.  You have been provided with a work excuse note.  Hope you feel better soon.  Shingles  Shingles is an infection that causes a painful skin rash and fluid-filled blisters. Shingles is caused by the same virus that causes chickenpox. Shingles only develops in people who:  Have had chickenpox.  Have gotten the chickenpox vaccine. (This is rare.)  The first symptoms of shingles may be itching, tingling, or pain in an area on your skin. A rash will follow in a few days or weeks. The rash is usually on one side of the body in a bandlike or beltlike pattern. Over time, the rash turns into fluid-filled blisters that break open, scab over, and dry up. Medicines may:  Help you manage pain.  Help you recover more quickly.  Help to prevent long-term problems.  Follow these instructions at home: Medicines  Take medicines only as told by your doctor.  Apply an anti-itch or numbing cream to the affected area as told by your doctor. Blister and Rash Care  Take a cool bath or put cool compresses on the area of the rash or blisters as told by your doctor. This may help with pain and itching.  Keep your rash covered with a loose bandage (dressing). Wear loose-fitting clothing.  Keep your rash and blisters clean with mild soap and cool water or as told by your doctor.  Check your rash every day for signs of infection. These include redness, swelling, and pain that lasts or gets  worse.  Do not pick your blisters.  Do not scratch your rash. General instructions  Rest as told by your doctor.  Keep all follow-up visits as told by your doctor. This is important.  Until your blisters scab over, your infection can cause chickenpox in people who have never had it or been vaccinated against it. To prevent this from happening, avoid touching other people or being around other people, especially: ? Babies. ? Pregnant women. ? Children who have eczema. ? Elderly people who have transplants. ? People who have chronic illnesses, such as leukemia or AIDS. Contact a doctor if:  Your pain does not get better with medicine.  Your pain does not get better after the rash heals.  Your rash looks infected. Signs of infection include: ? Redness. ? Swelling. ? Pain that lasts or gets worse. Get help right away if:  The rash is on your face or nose.  You have pain in your face, pain around your eye area, or loss of feeling on one side of your face.  You have ear pain or you have ringing in your ear.  You have loss of taste.  Your condition gets worse. This information is not intended to replace advice given to you by your health care provider. Make sure you discuss any questions you have with your health care provider. Document Released: 02/29/2008 Document Revised: 05/08/2016 Document Reviewed: 06/24/2014 Elsevier Interactive Patient Education  [employer].

## 2018-07-17 NOTE — Telephone Encounter (Signed)
No blood tests available for detecting lung cancer at this time

## 2018-07-17 NOTE — Patient Instructions (Addendum)
Follow up CT chest in 6 months  STOP SMOKING!!!!  ACYCLOVIR for SHINGLES on Left Back  See Employee Health   Increase minimum  AUTO CPAP pressure to 8

## 2018-07-17 NOTE — Progress Notes (Signed)
Patient ID: Monica Oliver DOB: 1951/09/18 AGE: 67 y.o. MRN: [id number]   PCP: Petra Kuba, MD   Chief Complaint:  Chief Complaint  Patient presents with  . choice-? shingles on left back side     Subjective:    HPI:  Monica Oliver is a 67 y.o. female presents for evaluation  Chief Complaint  Patient presents with  . choice-? shingles on left back side   67 year old female presents to [facility] with concern for herpes zoster. Patient reports three day history of erythematous papular rash, on left mid back. Began with burning sensation two days prior to development of rash; felt similar to sun burn.   Patient seen today, 07/17/2018, by Dr. Flora Lipps MD with [facility]. Previously scheduled appointment. Patient asked pulmonologist to look at rash. Was diagnosed with shingles. Prescribed Acyclovir. Advised to f/u at [facility] for further evaluation.  Patient has not applied or taken any OTC medication for symptom relief. Denies known new exposure. Denies associated pruritis. Patient with previous history of herpes zoster, approximately 7 years ago, on right shoulder. Prescribed antiviral, resolved, no issues/complications such as secondary bacterial infection or postherpetic neuralgia. Patient believes she has received herpes zoster vaccination.  Patient with DM2. Currently on Metformin. Last A1C able to find in chart was performed in October 2017, 6.4%.  Recent labs reveal BUN 17, Creatinine 1.15, and GFR of 49 on 11/28/2017. Per Cockcroft-Gault, 67 mL/min creatinine clearance. Adjusted for weight, 51 mL/min. Should not require medication adjustment for antiviral.  A complete, at least 10 system review of symptoms was performed, pertinent positives and negatives as mentioned in HPI, otherwise negative.  The following portions of the patient's history were reviewed and updated as appropriate: allergies, current medications and past medical  history.  Patient Active Problem List   Diagnosis Date Noted  . Personal history of tobacco use, presenting hazards to health 06/07/2016  . COPD (chronic obstructive pulmonary disease) ([place]) 04/18/2016  . DOE (dyspnea on exertion) 04/18/2016  . Tobacco abuse counseling 04/18/2016  . Smoker 04/18/2016  . OSA on CPAP 04/18/2016  . Diabetes ([place]) 05/04/2015    Allergies  Allergen Reactions  . Penicillins Other (See Comments)    Unsure- she's had it for so long    Current Outpatient Medications on File Prior to Visit  Medication Sig Dispense Refill  . acyclovir (ZOVIRAX) 400 MG tablet Take 1 tablet (400 mg total) by mouth 5 (five) times daily for 14 days. 70 tablet 0  . albuterol (PROVENTIL HFA;VENTOLIN HFA) 108 (90 Base) MCG/ACT inhaler Inhale 1 puff into the lungs every 6 (six) hours as needed. 1 Inhaler 5  . albuterol (PROVENTIL) (2.5 MG/3ML) 0.083% nebulizer solution Take 3 mLs (2.5 mg total) by nebulization every 6 (six) hours as needed for wheezing or shortness of breath. 30 mL 5  . aspirin 81 MG tablet Take 81 mg by mouth daily. 1 tablet daily    . atenolol (TENORMIN) 25 MG tablet Take 25 mg by mouth daily. 1 tablet once a day    . atorvastatin (LIPITOR) 40 MG tablet Take 40 mg by mouth daily. 1 tab per day    . budesonide-formoterol (SYMBICORT) 160-4.5 MCG/ACT inhaler Inhale 2 puffs into the lungs 2 (two) times daily. 1 Inhaler 5  . cetirizine (ZYRTEC) 10 MG tablet Take 10 mg by mouth daily as needed.    . fluticasone (FLONASE) 50 MCG/ACT nasal spray Place 1 spray into both nostrils daily. As needed  16 g 12  . lisinopril-hydrochlorothiazide (PRINZIDE,ZESTORETIC) 20-12.5 MG per tablet Take 1 tablet by mouth daily.    . metFORMIN (GLUCOPHAGE) 500 MG tablet Take 500 mg by mouth daily. Extended release    . naproxen (NAPROSYN) 500 MG tablet TAKE 1 TABLET BY MOUTH 2 TIMES DAILY WITH A MEAL 60 tablet 5  . omeprazole (PRILOSEC) 20 MG capsule Take 20 mg by mouth daily. prn    .  Tiotropium Bromide Monohydrate (SPIRIVA RESPIMAT) 1.25 MCG/ACT AERS Inhale 2 puffs into the lungs daily. 4 g 5  . TRUE METRIX BLOOD GLUCOSE TEST test strip   99  . varenicline (CHANTIX STARTING MONTH PAK) 0.5 MG X 11 & 1 MG X 42 tablet TAKE 0.5MG  TAB BY MOUTH ONCE DAILY X3 DAYS, TAKE 0.5MG  TAB 2X A DAY X4 DAYS, THEN TAKE 1MG  TAB 2 TIMES A DAY THEREAFTER 53 tablet 0   No current facility-administered medications on file prior to visit.        Objective:    Vitals:   07/17/18 1048  BP: 130/90  Pulse: 72  Temp: 98 F (36.7 C)  SpO2: 96%     Wt Readings from Last 3 Encounters:  07/17/18 193 lb (87.5 kg)  07/17/18 192 lb (87.1 kg)  07/16/18 194 lb (88 kg)    Physical Exam:   General Appearance:  Alert, cooperative, no distress, appears stated age. Afebrile.  Head:  Normocephalic, without obvious abnormality, atraumatic  Eyes:  PERRL, conjunctiva/corneas clear, EOM's intact, fundi benign, both eyes  Ears:  Normal TM's and external ear canals, both ears  Nose: Nares normal, septum midline,mucosa normal, no drainage or sinus tenderness  Throat: Lips, mucosa, and tongue normal; teeth and gums normal  Neck: Supple, symmetrical, trachea midline, no adenopathy;  thyroid: not enlarged, symmetric, no tenderness/mass/nodules; no carotid bruit or JVD  Back:   Symmetric, no curvature, ROM normal, no CVA tenderness  Lungs:   Clear to auscultation bilaterally, respirations unlabored  Heart:  Regular rate and rhythm, S1 and S2 normal, no murmur, rub, or gallop  Abdomen:   Soft, non-tender, bowel sounds active all four quadrants,  no masses, no organomegaly  Extremities: Extremities normal, atraumatic, no cyanosis or edema  Pulses: 2+ and symmetric  Skin: Left mid back, T9 dermatome, reveals scattered developing erythematous vesicles with erythematous base. Streaking distribution. Does not cross midline to right side. Does not go beyone back, does not cross mid axillary area. Mild  tenderness/soreness with palpation. No palpable warmth. No current discharge/drainage. No scabbing. Appearance consistent with herpes zoster.  Lymph nodes: Cervical, supraclavicular, and axillary nodes normal  Neurologic: Normal    Assessment & Plan:    Exam findings, diagnosis etiology and medication use and indications reviewed with patient. Follow-Up and discharge instructions provided. No emergent/urgent issues found on exam.  Patient education was provided.   Patient verbalized understanding of information provided and agrees with plan of care (POC), all questions answered. The patient is advised to call or return to clinic if condition does not see an improvement in symptoms, or to seek the care of the closest emergency department if condition worsens with the above plan.    1. Herpes zoster without complication  - famciclovir (FAMVIR) 500 MG tablet; Take 1 tablet (500 mg total) by mouth 3 (three) times daily for 7 days.  Dispense: 21 tablet; Refill: 0 - capsaicin (ZOSTRIX) 0.025 % cream; Apply topically 2 (two) times daily for 7 days.  Dispense: 60 g; Refill: 0  Patient with herpes  zoster along left T9 dermatome. Patient prescribed Famvir (believe tid dosage will improve compliance, compared to Acyclovir's five times per day dosage, prescribed by pulmonologist). Patient also prescribed capsaicin cream for pain relief. Discussed that fluid from vesicles is contagious, otherwise considered safe to return to work; patient requested off until this Saturday. Patient advised to return to clinic with any issues/concerns. Patient advised, in approximately 8 weeks, could consider receiving newer/more effect Shingles vaccination (Shingrix) - should be discussed with PCP.   Montey Hora, MHS, PA-C Advanced Practice Provider [facility]  [address] [facility], [place], [place] [address] (p):   [phone] [email or web address]

## 2018-07-17 NOTE — Progress Notes (Signed)
PULMONARY OFFICE FOLLOW UP NOTE  PROBLEMS:  Mild COPD Smoker  DATA: LDCT 06/07/16: normal PFTs 06/13/16: mild obstruction with borderline significant improvement after bronchodilator. Normal TLC. Mildly reduced DLCO  INTERVAL HISTORY: Previously seen by [facility] and diagnosed with mild COPD.   SUBJ: Patient has a diagnosis of sleep apnea Patient on auto CPAP 5 to 20 cm of water pressure Compliance report shows that she is 100% on her days and greater than 4 hours Patient is using her CPAP and is benefiting from it Her AHI is down to 0.3  Patient still actively smoking Smoking cessation advised  No signs of exacerbation at this time No signs of pulmonary infection at this time No signs of heart failure   Patient states that she has left flank pain and upon further examination she has a mild erythema scattered rash on her left flank  Patient states she had been diagnosed and treated with shingles before   CT chest obtained for lung cancer screening protocol in 07/16/2018 Images reviewed by me personally today There are few scattered undetermined bilateral nodules based upon report but I could not see any nodules at this time There was some lymphadenopathy anterior trachea space Follow-up CT chest needed in 6 months for interval assessment   Smoking Assessment and Cessation Counseling   Upon further questioning, Patient smokes 1/2 ppd  I have advised patient to quit/stop smoking as soon as possible due to high risk for multiple medical problems  Patient  is NOT willing to quit smoking  I have advised patient that we can assist and have options of Nicotine replacement therapy. I also advised patient on behavioral therapy and can provide oral medication therapy in conjunction with the other therapies  Follow up next Office visit  for assessment of smoking cessation  Smoking cessation counseling advised for 4 minutes    BP 110/78 (BP Location: Left Arm, Cuff Size: Normal)    Pulse 78   Ht 5\' 3"  (1.6 m)   Wt 192 lb (87.1 kg)   SpO2 98%   BMI 34.01 kg/m     Review of Systems:  Gen:  Denies  fever, sweats, chills weigh loss  HEENT: Denies blurred vision, double vision, ear pain, eye pain, hearing loss, nose bleeds, sore throat Cardiac:  No dizziness, chest pain or heaviness, chest tightness,edema, No JVD Resp:   No cough no wheezing no shortness of breath no chest pain Gi: Denies swallowing difficulty, stomach pain, nausea or vomiting, diarrhea, constipation, bowel incontinence Gu:  Denies bladder incontinence, burning urine Ext:   Denies Joint pain, stiffness or swelling Skin: +skin rash,  Endoc:  Denies polyuria, polydipsia , polyphagia or weight change Psych:   Denies depression, insomnia or hallucinations  Other:  All other systems negative     Physical Examination:   GENERAL:NAD, no fevers, chills, no weakness no fatigue HEAD: Normocephalic, atraumatic.  EYES: Pupils equal, round, reactive to light. Extraocular muscles intact. No scleral icterus.  MOUTH: Moist mucosal membrane. Dentition intact. No abscess noted.  EAR, NOSE, THROAT: Clear without exudates. No external lesions.  NECK: Supple. No thyromegaly. No nodules. No JVD.  PULMONARY: CTA B/L no wheezing, rhonchi, crackles CARDIOVASCULAR: S1 and S2. Regular rate and rhythm. No murmurs, rubs, or gallops. No edema. Pedal pulses 2+ bilaterally.  GASTROINTESTINAL: Soft, nontender, nondistended. No masses. Positive bowel sounds. No hepatosplenomegaly.  MUSCULOSKELETAL: No swelling, clubbing, or edema. Range of motion full in all extremities.  NEUROLOGIC: Cranial nerves II through XII are intact. No gross  focal neurological deficits. Sensation intact. Reflexes intact.  SKIN: + rashes, eythema possible shingles PSYCHIATRIC: Mood, affect within normal limits. The patient is awake, alert and oriented x 3. Insight, judgment intact.  ALL OTHER ROS ARE NEGATIVE      DATA:   CXR 09/24/16: vague  left upper lobe opacity   CXR 11/10/16 : resolution of LUL opacity CT chest 07/16/2018-and discrete bilateral pulmonary nodules not visualized but reported Mild lymphadenopathy anterior tracheal space  IMPRESSION: 67 year old female with mild COPD Gold stage A with underlying OSA with ongoing tobacco abuse   # 1 COPD mild Gold stage a Continue with albuterol as needed No indication for additional inhalers at this time  #2 sleep apnea Patient has excellent compliance report and is benefiting from auto CPAP therapy however she does say that her pressure is low at times so I have advised to increase her minimal pressure to 8  #3 smoking cessation strongly advised  #4 allergic rhinitis continue Flonase and Zyrtec  #5 probable shingles with rash and left flank pain I have prescribed acyclovir I have advised patient to go to employee health for further assessment  #6 CT chest in discrete pulmonary nodules however some mild anterior tracheal lymphadenopathy Recommend CT chest in 6 months for interval changes and follow-up   Patient satisfied with Plan of action and management. All questions answered  Corrin Parker, M.D.  [facility]  Medical Director [place] Director [facility] Cardio-Pulmonary Department

## 2018-07-19 ENCOUNTER — Telehealth: Payer: Self-pay | Admitting: Emergency Medicine

## 2018-07-19 NOTE — Telephone Encounter (Signed)
Left message follow up call for visit with [facility]

## 2018-07-19 NOTE — Telephone Encounter (Signed)
Spoke with patient, let her know that at this time there are no blood tests to detect lung cancer. Patient understands with no further questions at this time.

## 2018-07-20 ENCOUNTER — Telehealth: Payer: Self-pay | Admitting: Emergency Medicine

## 2018-07-20 NOTE — Telephone Encounter (Signed)
Yup, that's perfect. It patient wants [facility] to take another peak at the rash, can return to clinic on Monday. Patient should NOT take 2nd antiviral. Thanks, Sam (SFS PA-C).

## 2018-08-02 ENCOUNTER — Ambulatory Visit
Admission: RE | Admit: 2018-08-02 | Discharge: 2018-08-02 | Disposition: A | Discharge status: Home / Self Care | Payer: 59 | Source: Ambulatory Visit | Attending: Family Medicine | Admitting: Family Medicine

## 2018-08-02 DIAGNOSIS — Z1231 Encounter for screening mammogram for malignant neoplasm of breast: Secondary | ICD-10-CM | POA: Insufficient documentation

## 2018-08-06 NOTE — Progress Notes (Signed)
New Outpatient Visit Date: 08/09/2018  Referring Provider: Petra Kuba, MD [address] [place], [place] [address]  Chief Complaint: Chest pain  HPI:  Monica Oliver is a 67 y.o. female who is being seen today for the evaluation of chest pain at the request of Petra Kuba, MD. She has a history of hypertension, hyperlipidemia, diabetes mellitus, COPD, obstructive sleep apnea, shingles, and tobacco use. For the last 6-8 months, Monica Oliver has noticed intermittent left upper chest pain radiating to the left shoulder, which will come and go.  The most pronounced episode occurred in March, leading to an ED visit.  Workup was unrevealing.  She continues to have sporadic episodes, most recently ~1 week ago.  She reports associated shortness of breath, nausea, and diaphoresis.  Episodes most often occur when she is stressed; pain is not exertional.  It's maximal intensity is 4/10.  She reports having undergone a stress test more than 10 years ago and believes that it was normal.  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  Her activity is limited by back pain.  --------------------------------------------------------------------------------------------------  Cardiovascular History & Procedures: Cardiovascular Problems:  Chest pain  Risk Factors:  Hypertension, hyperlipidemia, diabetes mellitus, obesity, tobacco use, and age greater than 46; coronary artery and aortic atherosclerotic calcification noted on noncontrast CT of the chest (07/16/2018 - personally reviewed)  Cath/PCI:  None  CV Surgery:  None  EP Procedures and Devices:  None  Non-Invasive Evaluation(s):  Right lower extremity venous duplex (04/22/2011): No evidence of DVT in the right lower extremity.  Recent CV Pertinent Labs: Lab Results  Component Value Date   K 3.5 11/28/2017   K 3.6 03/15/2014   BUN 17 11/28/2017   BUN 11 03/15/2014   CREATININE 1.15 (H) 11/28/2017   CREATININE 1.01 03/15/2014    --------------------------------------------------------------------------------------------------  Past Medical History:  Diagnosis Date  . Allergy    environmental  . Arthritis   . Asthma   . COPD (chronic obstructive pulmonary disease) ([place])   . Diabetes mellitus without complication ([place])   . GERD (gastroesophageal reflux disease)   . Hyperlipidemia   . Hypertension   . Sleep apnea    C-Pap machine    Past Surgical History:  Procedure Laterality Date  . CESAREAN SECTION     1986  . CHOLECYSTECTOMY     1993    Current Meds  Medication Sig  . albuterol (PROVENTIL HFA;VENTOLIN HFA) 108 (90 Base) MCG/ACT inhaler Inhale 1 puff into the lungs every 6 (six) hours as needed.  Marland Kitchen albuterol (PROVENTIL) (2.5 MG/3ML) 0.083% nebulizer solution Take 3 mLs (2.5 mg total) by nebulization every 6 (six) hours as needed for wheezing or shortness of breath.  Marland Kitchen aspirin 81 MG tablet Take 81 mg by mouth daily. 1 tablet daily  . atorvastatin (LIPITOR) 40 MG tablet Take 40 mg by mouth daily. 1 tab per day  . budesonide-formoterol (SYMBICORT) 160-4.5 MCG/ACT inhaler Inhale 2 puffs into the lungs 2 (two) times daily.  . cetirizine (ZYRTEC) 10 MG tablet Take 10 mg by mouth daily as needed.  . fluticasone (FLONASE) 50 MCG/ACT nasal spray Place 1 spray into both nostrils daily. As needed  . lisinopril-hydrochlorothiazide (PRINZIDE,ZESTORETIC) 20-12.5 MG per tablet Take 1 tablet by mouth daily.  . metFORMIN (GLUCOPHAGE) 500 MG tablet Take 1,000 mg by mouth 2 (two) times daily with a meal. Extended release  . naproxen (NAPROSYN) 500 MG tablet TAKE 1 TABLET BY MOUTH 2 TIMES DAILY  WITH A MEAL  . omeprazole (PRILOSEC) 20 MG capsule Take 20 mg by mouth daily. prn  . Tiotropium Bromide Monohydrate (SPIRIVA RESPIMAT) 1.25 MCG/ACT AERS Inhale 2 puffs into the lungs daily.  . TRUE METRIX BLOOD GLUCOSE TEST test strip   . varenicline (CHANTIX STARTING MONTH PAK) 0.5 MG X 11 & 1 MG  X 42 tablet TAKE 0.5MG  TAB BY MOUTH ONCE DAILY X3 DAYS, TAKE 0.5MG  TAB 2X A DAY X4 DAYS, THEN TAKE 1MG  TAB 2 TIMES A DAY THEREAFTER  . [DISCONTINUED] atenolol (TENORMIN) 25 MG tablet Take 25 mg by mouth daily. 1 tablet once a day    Allergies: Penicillins  Social History   Tobacco Use  . Smoking status: Current Every Day Smoker    Packs/day: 0.30    Years: 40.00    Pack years: 12.00    Types: Cigarettes    Start date: 12/15/1971  . Smokeless tobacco: Never Used  . Tobacco comment: feb.2017- tried Chantix- made her nauseous. Has the nicotine patch- not started  Substance Use Topics  . Alcohol use: No    Alcohol/week: 0.0 standard drinks  . Drug use: No    Family History  Problem Relation Age of Onset  . Hypertension Mother   . Cancer Mother        lung  . Cancer Father        prostate  . Hyperlipidemia Father   . Heart attack Father 4  . Cancer Brother        prostate  . Cirrhosis Sister   . Hepatitis Sister   . Breast cancer Paternal Aunt     Review of Systems: A 12-system review of systems was performed and was negative except as noted in the HPI.  --------------------------------------------------------------------------------------------------  Physical Exam: BP 140/80 (BP Location: Right Arm, Patient Position: Sitting, Cuff Size: Normal)   Ht 5\' 3"  (1.6 m)   Wt 194 lb 8 oz (88.2 kg)   BMI 34.45 kg/m   General:  NAD. HEENT: No conjunctival pallor or scleral icterus. Moist mucous membranes. OP clear. Neck: Supple without lymphadenopathy, thyromegaly, JVD, or HJR. No carotid bruit. Lungs: Normal work of breathing. Clear to auscultation bilaterally without wheezes or crackles. Heart: Regular rate and rhythm without murmurs, rubs, or gallops. Unable to assess PMI due to body habitus. Abd: Bowel sounds present. Soft, NT/ND.  Unable to assess HSM due to body habitus. Ext: No lower extremity edema. Radial, PT, and DP pulses are 2+ bilaterally Skin: Warm and dry  without rash. Neuro: CNIII-XII intact. Strength and fine-touch sensation intact in upper and lower extremities bilaterally. Psych: Normal mood and affect.  EKG:  NSR with poor R-wave progression in V1 and V2.  Otherwise, no significant abnormality.  Lab Results  Component Value Date   WBC 11.6 (H) 11/28/2017   HGB 15.2 11/28/2017   HCT 45.6 11/28/2017   MCV 86.8 11/28/2017   PLT 250 11/28/2017    Lab Results  Component Value Date   NA 134 (L) 11/28/2017   K 3.5 11/28/2017   CL 96 (L) 11/28/2017   CO2 26 11/28/2017   BUN 17 11/28/2017   CREATININE 1.15 (H) 11/28/2017   GLUCOSE 91 11/28/2017   ALT 19 09/24/2016    No results found for: CHOL, HDL, LDLCALC, LDLDIRECT, TRIG, CHOLHDL   --------------------------------------------------------------------------------------------------  ASSESSMENT AND PLAN: Stable angina Pain has both typical and atypical features.  The patient has multiple risk factors and was also noted to have mild coronary and aortic atherosclerosis by  prior CT.  We have discussed further workup options and have agreed to proceed with cardiac CTA.  In the meantime, I will increase atenolol to 50 mg daily to escalate antianginal therapy and optimize heart rate for cardiac CTA.  BMP will be obtained shortly before the CTA to ensure normal renal function.  Hypertension BP mildly elevated.  As above, atenolol will be increased to 50 mg daily.  Continue current dose of lisinopril-HCTZ.  Hyperlipidemia No recent lipid panel in our system.  Given aortic and coronary atherosclerosis, I recommend continued high-intensity statin therapy.  Aortic atherosclerosis Noted on CT of the chest.  Continue statin therapy, as well as blood pressure and diabetes control.  Follow-up: Return to clinic in 8 weeks.  [name], MD 08/10/2018 10:27 PM

## 2018-08-09 ENCOUNTER — Ambulatory Visit: Payer: 59 | Admitting: Internal Medicine

## 2018-08-09 ENCOUNTER — Encounter: Payer: Self-pay | Admitting: Internal Medicine

## 2018-08-09 VITALS — BP 140/80 | Ht 63.0 in | Wt 194.5 lb

## 2018-08-09 DIAGNOSIS — E785 Hyperlipidemia, unspecified: Secondary | ICD-10-CM | POA: Diagnosis not present

## 2018-08-09 DIAGNOSIS — I208 Other forms of angina pectoris: Secondary | ICD-10-CM | POA: Diagnosis not present

## 2018-08-09 DIAGNOSIS — I700 Atherosclerosis of aorta: Secondary | ICD-10-CM | POA: Diagnosis not present

## 2018-08-09 DIAGNOSIS — I10 Essential (primary) hypertension: Secondary | ICD-10-CM

## 2018-08-09 DIAGNOSIS — Z01810 Encounter for preprocedural cardiovascular examination: Secondary | ICD-10-CM | POA: Diagnosis not present

## 2018-08-09 MED ORDER — ATENOLOL 50 MG PO TABS: 50.0000 mg | ORAL_TABLET | Freq: Two times a day (BID) | ORAL | 3 refills | Status: DC

## 2018-08-09 NOTE — Patient Instructions (Addendum)
Medication Instructions:  Your physician has recommended you make the following change in your medication:  1- INCREASE Atenolol to 50 mg by mouth once a day.  If you need a refill on your cardiac medications before your next appointment, please call your pharmacy.   Lab work: Your physician recommends that you return for lab work [place]. - Please go to the [facility]. You will check in at the front desk to the right as you walk into the atrium. Valet Parking is offered if needed. - You may go to the [facility] within a week or 2 once you know the scheduled time/date of the CT.  If you have labs (blood work) drawn today and your tests are completely normal, you will receive your results only by: Marland Kitchen MyChart Message (if you have MyChart) OR . A paper copy in the mail If you have any lab test that is abnormal or we need to change your treatment, we will call you to review the results.  Testing/Procedures: Your physician has requested that you have cardiac CT. Cardiac computed tomography (CT) is a painless test that uses an x-ray machine to take clear, detailed pictures of your heart. For further information please visit [email or web address]. Please follow instruction sheet as given.   Please arrive at the [facility] main entrance of [facility] at xx:xx AM (30-45 minutes prior to test start time)  [facility] [place], [place] [address] [phone]  Proceed to the [facility] Radiology Department (First Floor).  Please follow these instructions carefully (unless otherwise directed):   On the Night Before the Test: . Be sure to Drink plenty of water. . Do not consume any caffeinated/decaffeinated beverages or chocolate 12 hours prior to your test. . Do not take any antihistamines 12 hours prior to your test. . If you take Metformin do not take 24 hours prior to test.   On the Day of the Test: . Drink  plenty of water. Do not drink any water within one hour of the test. . Do not eat any food 4 hours prior to the test. . You may take your regular medications prior to the test.  . Take metoprolol (Lopressor) two hours prior to test. . HOLD Hydrochlorothiazide morning of the test.       After the Test: . Drink plenty of water. . After receiving IV contrast, you may experience a mild flushed feeling. This is normal. . On occasion, you may experience a mild rash up to 24 hours after the test. This is not dangerous. If this occurs, you can take Benadryl 25 mg and increase your fluid intake. . If you experience trouble breathing, this can be serious. If it is severe call 911 IMMEDIATELY. If it is mild, please call our office. . If you take any of these medications: Glipizide/Metformin, Avandament, Glucavance, please do not take 48 hours after completing test.    Follow-Up: At [facility], you and your health needs are our priority.  As part of our continuing mission to provide you with exceptional heart care, we have created designated Provider Care Teams.  These Care Teams include your primary Cardiologist (physician) and Advanced Practice Providers (APPs -  Physician Assistants and Nurse Practitioners) who all work together to provide you with the care you need, when you need it. You will need a follow up appointment in 8 weeks.  Please call our office 2 months in  advance to schedule this appointment.  You may see DR Harrell Gave END or one of the following Advanced Practice Providers on your designated Care Team:   Murray Hodgkins, NP Christell Faith, PA-C . Marrianne Mood, PA-C

## 2018-08-10 ENCOUNTER — Encounter: Payer: Self-pay | Admitting: Internal Medicine

## 2018-08-10 DIAGNOSIS — I208 Other forms of angina pectoris: Secondary | ICD-10-CM | POA: Insufficient documentation

## 2018-08-10 DIAGNOSIS — E785 Hyperlipidemia, unspecified: Secondary | ICD-10-CM | POA: Insufficient documentation

## 2018-08-10 DIAGNOSIS — I10 Essential (primary) hypertension: Secondary | ICD-10-CM | POA: Insufficient documentation

## 2018-08-10 DIAGNOSIS — I700 Atherosclerosis of aorta: Secondary | ICD-10-CM | POA: Insufficient documentation

## 2018-09-04 ENCOUNTER — Other Ambulatory Visit
Admission: RE | Admit: 2018-09-04 | Discharge: 2018-09-04 | Disposition: A | Discharge status: Home / Self Care | Payer: 59 | Source: Ambulatory Visit | Attending: Internal Medicine | Admitting: Internal Medicine

## 2018-09-04 DIAGNOSIS — I208 Other forms of angina pectoris: Secondary | ICD-10-CM | POA: Diagnosis not present

## 2018-09-04 DIAGNOSIS — Z01810 Encounter for preprocedural cardiovascular examination: Secondary | ICD-10-CM | POA: Insufficient documentation

## 2018-09-04 LAB — BASIC METABOLIC PANEL
Anion gap: 10 (ref 5–15)
BUN: 12 mg/dL (ref 8–23)
CHLORIDE: 95 mmol/L — AB (ref 98–111)
CO2: 29 mmol/L (ref 22–32)
CREATININE: 1.08 mg/dL — AB (ref 0.44–1.00)
Calcium: 9.4 mg/dL (ref 8.9–10.3)
GFR calc Af Amer: >60 mL/min (ref >60)
GFR calc non Af Amer: 53 mL/min — ABNORMAL LOW (ref >60)
GLUCOSE: 138 mg/dL — AB (ref 70–99)
POTASSIUM: 3.8 mmol/L (ref 3.5–5.1)
SODIUM: 134 mmol/L — AB (ref 135–145)

## 2018-09-10 ENCOUNTER — Ambulatory Visit (HOSPITAL_COMMUNITY)
Admission: RE | Admit: 2018-09-10 | Discharge: 2018-09-10 | Disposition: A | Discharge status: Home / Self Care | Payer: 59 | Source: Ambulatory Visit | Attending: Internal Medicine | Admitting: Internal Medicine

## 2018-09-10 DIAGNOSIS — I208 Other forms of angina pectoris: Secondary | ICD-10-CM | POA: Insufficient documentation

## 2018-09-10 MED ORDER — METOPROLOL TARTRATE 5 MG/5ML IV SOLN
INTRAVENOUS | Status: AC
  Filled 2018-09-10: qty 15

## 2018-09-10 MED ORDER — IOPAMIDOL (ISOVUE-370) INJECTION 76%
100.0000 mL | Freq: Once | INTRAVENOUS | Status: AC | PRN
  Administered 2018-09-10: 100 mL via INTRAVENOUS

## 2018-09-10 MED ORDER — NITROGLYCERIN 0.4 MG SL SUBL
SUBLINGUAL_TABLET | SUBLINGUAL | Status: AC
  Filled 2018-09-10: qty 2

## 2018-09-10 MED ORDER — NITROGLYCERIN 0.4 MG SL SUBL
0.8000 mg | SUBLINGUAL_TABLET | Freq: Once | SUBLINGUAL | Status: AC
  Administered 2018-09-10: 0.8 mg via SUBLINGUAL
  Filled 2018-09-10: qty 25

## 2018-09-10 MED ORDER — METOPROLOL TARTRATE 5 MG/5ML IV SOLN
5.0000 mg | INTRAVENOUS | Status: AC | PRN
  Administered 2018-09-10 (×6): 5 mg via INTRAVENOUS
  Filled 2018-09-10 (×6): qty 5

## 2018-09-13 ENCOUNTER — Telehealth: Payer: Self-pay | Admitting: *Deleted

## 2018-09-13 MED ORDER — ISOSORBIDE MONONITRATE ER 30 MG PO TB24: 30.0000 mg | ORAL_TABLET | Freq: Every day | ORAL | 3 refills | Status: DC

## 2018-09-13 NOTE — Telephone Encounter (Signed)
No answer. Left message to call back.   

## 2018-09-13 NOTE — Telephone Encounter (Signed)
-----   Message from [name], MD sent at 09/12/2018  5:26 PM EST ----- Please let Monica Oliver know that I have reviewed her cardiac CTA, which showed a significant narrowing in a moderate-sized branch of one of her coronary arteries.  This could explain some of her intermittent chest pain.  The remainder of her arteries look fine.  I recommend that we try to optimize her medications.  If she still has intermittent chest pain in spite of this, we will need to consider a catheterization and stenting procedure in the future.  If she is agreeable, I recommend starting isosorbide mononitrate 30 mg daily and following up in the office as planned next month.  If symptoms worsen in the meantime, she should contact us or seek immediate medical attention.

## 2018-09-17 NOTE — Telephone Encounter (Signed)
Called patient and verbalized understanding. States she is agreeable to the isosorbide. However, states that since increasing atenolol to 50 mg daily she has felt more slow and fatigued. Sometimes she feels dizzy as well. She takes her HR occasionally and it is in the 60-70's. No BP's to report.  States when she was on atenolol 25 mg she did not feel like this. Also, said she received high amount of IV medicine with the Cardiac CT to lower her BP last week and is just now getting that out of her system. Advised I will route to Dr End for review and advice.

## 2018-09-18 MED ORDER — ATENOLOL 25 MG PO TABS: 25.0000 mg | ORAL_TABLET | Freq: Every day | ORAL | 3 refills | Status: DC

## 2018-09-18 NOTE — Telephone Encounter (Signed)
Patient verbalized understanding to decrease atenolol back to 25 mg daily and continue the isosorbide. Patient states when she takes the isosorbide she feels a little "lightheaded" for a little bit. Advised patient to take in the evening prior to bed to see if that helps. Patient was appreciative. Med list updated. She did not need refills at this time.

## 2018-09-18 NOTE — Telephone Encounter (Signed)
HR in the 60's and 70's is normal.  However, if she feels worse since increasing atenolol, I think it is ok to go down to 25 mg daily and add isosorbide mononitrate, as previously recommended.  [name], MD [facility] Pager: [phone]

## 2018-09-24 ENCOUNTER — Encounter: Payer: Self-pay | Admitting: Physician Assistant

## 2018-09-24 ENCOUNTER — Ambulatory Visit (INDEPENDENT_AMBULATORY_CARE_PROVIDER_SITE_OTHER): Payer: Self-pay | Admitting: Physician Assistant

## 2018-09-24 VITALS — BP 130/70 | HR 102 | Temp 98.4°F | Resp 12 | Wt 186.0 lb

## 2018-09-24 DIAGNOSIS — R059 Cough, unspecified: Secondary | ICD-10-CM

## 2018-09-24 DIAGNOSIS — J441 Chronic obstructive pulmonary disease with (acute) exacerbation: Secondary | ICD-10-CM

## 2018-09-24 DIAGNOSIS — R05 Cough: Secondary | ICD-10-CM

## 2018-09-24 MED ORDER — PREDNISONE 20 MG PO TABS: 40.0000 mg | ORAL_TABLET | Freq: Every day | ORAL | 0 refills | Status: AC

## 2018-09-24 MED ORDER — BENZONATATE 200 MG PO CAPS: 200.0000 mg | ORAL_CAPSULE | Freq: Two times a day (BID) | ORAL | 0 refills | Status: DC | PRN

## 2018-09-24 MED ORDER — DOXYCYCLINE HYCLATE 100 MG PO TABS: 100.0000 mg | ORAL_TABLET | Freq: Two times a day (BID) | ORAL | 0 refills | Status: AC

## 2018-09-24 MED ORDER — PREDNISONE 20 MG PO TABS: 20.0000 mg | ORAL_TABLET | Freq: Every day | ORAL | 0 refills | Status: DC

## 2018-09-24 NOTE — Progress Notes (Signed)
Patient ID: Monica Oliver DOB: 10-Mar-1951 AGE: 67 y.o. MRN: [id number]   PCP: Petra Kuba, MD   Chief Complaint:  Chief Complaint  Patient presents with  . Cough    dry cough x2 days     Subjective:    HPI:  Monica Oliver is a 67 y.o. female presents for evaluation  Chief Complaint  Patient presents with  . Cough    dry cough x52 days    67 year old female presents to [facility] with three day history of URI symptoms. Primary complaint cough. Harsh. Dry. Nonproductive. Associated wheezing. Worse at night, causing difficulty sleeping. Reports associated raw/irritated throat, rhinorrhea, and postnasal drip. Has taken OTC Dayquil and Nyquil with minimal relief. Has been using albuterol inhaler, last use 1 hour ago, with moderate symptom improvement. Denies fever, chills, headache, ear pain, sinus pain, chest pain, SOB, nausea/vomiting. Patient with COPD. Managed by Dr. Flora Lipps MD with [place]. Last seen 07/17/2018. Patient smokes 1/2 ppd. On Symbicort and has albuterol inhaler. Patient with allergic rhinitis, uses Flonase and Zyrtec. Patient's last COPD exacerbation on 10/23/2017. Previous to that episode, was 09/15/2017. Patient with pneumonia in December 2017 (left upper lobe); managed outpatient.  Patient with DM2. On Metformin. A1C in October 2017 was 6.4%. Cannot find a more recent A1C in Epic; patient states well controlled.  A limited review of symptoms was performed, pertinent positives and negatives as mentioned in HPI.  The following portions of the patient's history were reviewed and updated as appropriate: allergies, current medications and past medical history.  Patient Active Problem List   Diagnosis Date Noted  . Stable angina ([place]) 08/10/2018  . Hyperlipidemia LDL goal <70 08/10/2018  . Essential hypertension 08/10/2018  . Aortic atherosclerosis ([place]) 08/10/2018  . Personal history of tobacco use, presenting hazards to  health 06/07/2016  . COPD (chronic obstructive pulmonary disease) ([place]) 04/18/2016  . DOE (dyspnea on exertion) 04/18/2016  . Tobacco abuse counseling 04/18/2016  . Smoker 04/18/2016  . OSA on CPAP 04/18/2016  . Diabetes ([place]) 05/04/2015    Allergies  Allergen Reactions  . Penicillins Other (See Comments)    Unsure- she's had it for so long    Current Outpatient Medications on File Prior to Visit  Medication Sig Dispense Refill  . albuterol (PROVENTIL HFA;VENTOLIN HFA) 108 (90 Base) MCG/ACT inhaler Inhale 1 puff into the lungs every 6 (six) hours as needed. 1 Inhaler 5  . albuterol (PROVENTIL) (2.5 MG/3ML) 0.083% nebulizer solution Take 3 mLs (2.5 mg total) by nebulization every 6 (six) hours as needed for wheezing or shortness of breath. 30 mL 5  . aspirin 81 MG tablet Take 81 mg by mouth daily. 1 tablet daily    . atenolol (TENORMIN) 25 MG tablet Take 1 tablet (25 mg total) by mouth daily. 180 tablet 3  . atorvastatin (LIPITOR) 40 MG tablet Take 40 mg by mouth daily. 1 tab per day    . budesonide-formoterol (SYMBICORT) 160-4.5 MCG/ACT inhaler Inhale 2 puffs into the lungs 2 (two) times daily. 1 Inhaler 5  . cetirizine (ZYRTEC) 10 MG tablet Take 10 mg by mouth daily as needed.    . fluticasone (FLONASE) 50 MCG/ACT nasal spray Place 1 spray into both nostrils daily. As needed 16 g 12  . isosorbide mononitrate (IMDUR) 30 MG 24 hr tablet Take 1 tablet (30 mg total) by mouth daily. 90 tablet 3  . lisinopril-hydrochlorothiazide (PRINZIDE,ZESTORETIC) 20-12.5 MG per tablet Take 1 tablet by mouth daily.    Marland Kitchen  metFORMIN (GLUCOPHAGE) 500 MG tablet Take 1,000 mg by mouth 2 (two) times daily with a meal. Extended release    . naproxen (NAPROSYN) 500 MG tablet TAKE 1 TABLET BY MOUTH 2 TIMES DAILY WITH A MEAL 60 tablet 5  . omeprazole (PRILOSEC) 20 MG capsule Take 20 mg by mouth daily. prn    . Tiotropium Bromide Monohydrate (SPIRIVA RESPIMAT) 1.25 MCG/ACT AERS Inhale 2 puffs into the lungs daily. 4  g 5  . TRUE METRIX BLOOD GLUCOSE TEST test strip   99  . varenicline (CHANTIX STARTING MONTH PAK) 0.5 MG X 11 & 1 MG X 42 tablet TAKE 0.5MG  TAB BY MOUTH ONCE DAILY X3 DAYS, TAKE 0.5MG  TAB 2X A DAY X4 DAYS, THEN TAKE 1MG  TAB 2 TIMES A DAY THEREAFTER 53 tablet 0   No current facility-administered medications on file prior to visit.        Objective:   Vitals:   09/24/18 1541  BP: 130/70  Pulse: (!) 102  Resp: 12  Temp: 98.4 F (36.9 C)  SpO2: 98%     Wt Readings from Last 3 Encounters:  09/24/18 186 lb (84.4 kg)  08/09/18 194 lb 8 oz (88.2 kg)  07/17/18 193 lb (87.5 kg)    Physical Exam:   General Appearance:  Patient sitting comfortably on examination table. Conversational. Kermit Balo self-historian. In no acute distress. Afebrile. Able to complete full sentences.  Head:  Normocephalic, without obvious abnormality, atraumatic  Eyes:  PERRL, conjunctiva/corneas clear, EOM's intact  Ears:  Bilateral ear canals WNL. No erythema or edema. No discharge/drainage. Bilateral TMs WNL. No erythema, injection, or serous effusion. No scar tissue.  Nose: Nares normal, septum midline. Nasal mucosa with bogginess and scant clear rhinorrhea. No sinus tenderness with percussion/palpation.  Throat: Lips, mucosa, and tongue normal; teeth and gums normal. Throat reveals no erythema. Tonsils with no enlargement or exudate.  Neck: Supple, symmetrical, trachea midline, no adenopathy  Lungs:   Lungs reveal good aeration. Mildly coarse breath sounds diffusely. Harsh/bronchitic cough few times during examination and elicited with deep inspiration. No audible wheezing. No wheeze with forced expiration. No rales or rhonchi.  Heart:  Regular rate (102bpm) and rhythm, S1 and S2 normal, no murmur, rub, or gallop  Extremities: Extremities normal, atraumatic, no cyanosis or edema  Pulses: 2+ and symmetric  Skin: Skin color, texture, turgor normal, no rashes or lesions  Lymph nodes: Cervical, supraclavicular, and  axillary nodes normal  Neurologic: Normal    Assessment & Plan:    Exam findings, diagnosis etiology and medication use and indications reviewed with patient. Follow-Up and discharge instructions provided. No emergent/urgent issues found on exam.  Patient education was provided.   Patient verbalized understanding of information provided and agrees with plan of care (POC), all questions answered. The patient is advised to call or return to clinic if condition does not see an improvement in symptoms, or to seek the care of the closest emergency department if condition worsens with the below plan.    1. Cough  - benzonatate (TESSALON) 200 MG capsule; Take 1 capsule (200 mg total) by mouth 2 (two) times daily as needed for cough.  Dispense: 20 capsule; Refill: 0  2. COPD exacerbation (HCC)  - doxycycline (VIBRA-TABS) 100 MG tablet; Take 1 tablet (100 mg total) by mouth 2 (two) times daily for 5 days.  Dispense: 10 tablet; Refill: 0 - predniSONE (DELTASONE) 20 MG tablet; Take 2 tablets (40 mg total) by mouth daily with breakfast for 5 days.  Dispense: 10 tablet; Refill: 0  Patient with 3 day history of URI symptoms; primary complaint harsh cough. Coarse breath sounds, but good aeration and no significant wheeze. Due to patient's COPD; prescribed 5-day course of prednisone 40mg . Also prescribed Tessalon Perles for cough. Gave patient wait&hold antibiotic of Doxycycline 100mg  bid x 5 days. Advised patient continue to use Symbicort and use albuterol inhaler or albuterol nebulizer treatment every 4 hours as needed for cough/wheezing. Advised patient follow-up in 4-5 days at [facility], urgent care, PCP's office or pulmonologist's office if not feeling any better, sooner with worsening symptoms.   Darlin Priestly, MHS, PA-C Montey Hora, MHS, PA-C Advanced Practice Provider [facility]  [address], [facility], [place], [place] [address] (p):   [phone] [email or web address]

## 2018-09-24 NOTE — Patient Instructions (Addendum)
Thank you for choosing [facility] for your health care needs.  You have been diagnosed with a cough. Suspect viral upper respiratory infection (a cold); however, concern for COPD exacerbation.  You have been prescribed a short course of prednisone (a steroid): take 2 pills once a day x 5 days. Continue to use albuterol inhaler. Continue to use Symbicort inhaler.  You have been prescribed Tessalon Perles, will help with cough.  You have been given a printed script for an antibiotic, Doxycycline. Start antibiotic in 2-3 days if symptoms not improving.  Follow-up with Monica Oliver, your family physician, or your pulmonologist, or urgent care in 4-5 days if symptoms not improving, sooner with any worsening symptoms.  Chronic Obstructive Pulmonary Disease Exacerbation Chronic obstructive pulmonary disease (COPD) is a long-term (chronic) lung problem. In COPD, the flow of air from the lungs is limited. COPD exacerbations are times that breathing gets worse and you need more than your normal treatment. Without treatment, they can be life threatening. If they happen often, your lungs can become more damaged. If your COPD gets worse, your doctor may treat you with:  Medicines.  Oxygen.  Different ways to clear your airway, such as using a mask. Follow these instructions at home: Medicines  Take over-the-counter and prescription medicines only as told by your doctor.  If you take an antibiotic or steroid medicine, do not stop taking the medicine even if you start to feel better.  Keep up with shots (vaccinations) as told by your doctor. Be sure to get a yearly (annual) flu shot. Lifestyle  Do not smoke. If you need help quitting, ask your doctor.  Eat healthy foods.  Exercise regularly.  Get plenty of sleep.  Avoid tobacco smoke and other things that can bother your lungs.  Wash your hands often with soap and water. This will help keep you from getting an infection. If you cannot use soap  and water, use hand sanitizer.  During flu season, avoid areas that are crowded with people. General instructions  Drink enough fluid to keep your pee (urine) clear or pale yellow. Do not do this if your doctor has told you not to.  Use a cool mist machine (vaporizer).  If you use oxygen or a machine that turns medicine into a mist (nebulizer), continue to use it as told.  Follow all instructions for rehabilitation. These are steps you can take to make your body work better.  Keep all follow-up visits as told by your doctor. This is important. Contact a doctor if:  Your COPD symptoms get worse than normal. Get help right away if:  You are short of breath and it gets worse.  You have trouble talking.  You have chest pain.  You cough up blood.  You have a fever.  You keep throwing up (vomiting).  You feel weak or you pass out (faint).  You feel confused.  You are not able to sleep because of your symptoms.  You are not able to do daily activities. Summary  COPD exacerbations are times that breathing gets worse and you need more treatment than normal.  COPD exacerbations can be very serious and may cause your lungs to become more damaged.  Do not smoke. If you need help quitting, ask your doctor.  Stay up-to-date on your shots. Get a flu shot every year. This information is not intended to replace advice given to you by your health care provider. Make sure you discuss any questions you have with your health care  provider. Document Released: 09/01/2011 Document Revised: 10/17/2016 Document Reviewed: 10/17/2016 Elsevier Interactive Patient Education  2019 [employer].

## 2018-09-28 ENCOUNTER — Telehealth: Payer: Self-pay | Admitting: Emergency Medicine

## 2018-09-28 NOTE — Telephone Encounter (Signed)
Left message following up on visit with [facility] 

## 2018-10-10 ENCOUNTER — Encounter: Payer: Self-pay | Admitting: Internal Medicine

## 2018-10-10 ENCOUNTER — Ambulatory Visit: Payer: 59 | Admitting: Internal Medicine

## 2018-10-10 VITALS — BP 110/70 | HR 79 | Ht 63.0 in | Wt 188.2 lb

## 2018-10-10 DIAGNOSIS — R140 Abdominal distension (gaseous): Secondary | ICD-10-CM | POA: Diagnosis not present

## 2018-10-10 DIAGNOSIS — R0600 Dyspnea, unspecified: Secondary | ICD-10-CM

## 2018-10-10 DIAGNOSIS — E785 Hyperlipidemia, unspecified: Secondary | ICD-10-CM

## 2018-10-10 DIAGNOSIS — I10 Essential (primary) hypertension: Secondary | ICD-10-CM | POA: Diagnosis not present

## 2018-10-10 DIAGNOSIS — I700 Atherosclerosis of aorta: Secondary | ICD-10-CM

## 2018-10-10 DIAGNOSIS — I25110 Atherosclerotic heart disease of native coronary artery with unstable angina pectoris: Secondary | ICD-10-CM | POA: Insufficient documentation

## 2018-10-10 DIAGNOSIS — R0609 Other forms of dyspnea: Secondary | ICD-10-CM

## 2018-10-10 DIAGNOSIS — I25118 Atherosclerotic heart disease of native coronary artery with other forms of angina pectoris: Secondary | ICD-10-CM | POA: Diagnosis not present

## 2018-10-10 MED ORDER — FUROSEMIDE 20 MG PO TABS: 20.0000 mg | ORAL_TABLET | Freq: Every day | ORAL | 3 refills | Status: DC

## 2018-10-10 NOTE — Patient Instructions (Signed)
Medication Instructions:  Your physician has recommended you make the following change in your medication:  1- TAKE Furosemide 20 mg by mouth once a day AS NEEDED for swelling and bloating.   If you need a refill on your cardiac medications before your next appointment, please call your pharmacy.   Lab work: none If you have labs (blood work) drawn today and your tests are completely normal, you will receive your results only by: Marland Kitchen MyChart Message (if you have MyChart) OR . A paper copy in the mail If you have any lab test that is abnormal or we need to change your treatment, we will call you to review the results.  Testing/Procedures: Your physician has requested that you have an echocardiogram. Echocardiography is a painless test that uses sound waves to create images of your heart. It provides your doctor with information about the size and shape of your heart and how well your heart's chambers and valves are working. This procedure takes approximately one hour. There are no restrictions for this procedure. You may get an IV, if needed, to receive an ultrasound enhancing agent through to better visualize your heart.    Follow-Up: At [facility], you and your health needs are our priority.  As part of our continuing mission to provide you with exceptional heart care, we have created designated Provider Care Teams.  These Care Teams include your primary Cardiologist (physician) and Advanced Practice Providers (APPs -  Physician Assistants and Nurse Practitioners) who all work together to provide you with the care you need, when you need it. You will need a follow up appointment in 3 months.  Please call our office 2 months in advance to schedule this appointment.  You may see DR Harrell Gave END or one of the following Advanced Practice Providers on your designated Care Team:   Murray Hodgkins, NP Christell Faith, PA-C . Marrianne Mood, PA-C

## 2018-10-10 NOTE — Progress Notes (Signed)
Follow-up Outpatient Visit Date: 10/10/2018  Primary Care Provider: Petra Kuba, MD [place] [address]  Chief Complaint: Follow-up abnormal cardiac CTA  HPI:  Monica Oliver is a 68 y.o. year-old female with history of stable angina, hypertension, hyperlipidemia, diabetes mellitus, COPD, obstructive sleep apnea, shingles, and tobacco use, who presents for follow-up of chest pain.  I met Monica Oliver in November, at which time she reported 6-8 months of left upper chest pain radiating to the left arm, typically with stress.  Pain and associated symptoms (shortness of breath, nausea, and diaphoresis) were not exertional.  We agreed to increase atenolol to 50 mg daily for improved BP control as well as antianginal therapy.  Cardiac CTA showed single-vessel CAD with hemodynamically significant stenosis in rPL branch.  No significant disease was seen in the RCA proper and left coronary artery.  Today, Monica Oliver reports that previous described chest tightness has resolved with the addition of isosorbide mononitrate.  She has stable exertional dyspnea but has felt a bit more bloated and tight in her abdomen over the last several weeks.  She denies lower extremity edema.  She denies orthopnea and PND, using CPAP nightly.  She did not tolerate atenolol 50 mg daily due to generalized malaise and fatigue.  She is now taking 25 mg daily again.  She was also switched from atorvastatin to simvastatin by her PCP due to myalgias; she is tolerating simvastatin well.  --------------------------------------------------------------------------------------------------  Cardiovascular History & Procedures: Cardiovascular Problems:  Coronary artery disease  Risk Factors:  Hypertension, hyperlipidemia, diabetes mellitus, obesity, tobacco use, and age greater than 43; coronary artery and aortic atherosclerotic calcification noted on noncontrast CT of the chest (07/16/2018 - personally  reviewed)  Cath/PCI:  None  CV Surgery:  None  EP Procedures and Devices:  None  Non-Invasive Evaluation(s):  Cardiac CTA (09/10/18): CAC score 306 (91st percentile).  LMCA normal.  LAD normal.  LCx relatively small with <50% stenosis in mid vessel.  Small ramus normal.  RCA with <50% stenosis in mid vessel.  51-69% stenosis in distal RCA/rPL (CTFFR of rPL 0.52).   Right lower extremity venous duplex (04/22/2011): No evidence of DVT in the right lower extremity.  Recent CV Pertinent Labs: Lab Results  Component Value Date   K 3.8 09/04/2018   K 3.6 03/15/2014   BUN 12 09/04/2018   BUN 11 03/15/2014   CREATININE 1.08 (H) 09/04/2018   CREATININE 1.01 03/15/2014    Past medical and surgical history were reviewed and updated in EPIC.  Current Meds  Medication Sig  . albuterol (PROVENTIL HFA;VENTOLIN HFA) 108 (90 Base) MCG/ACT inhaler Inhale 1 puff into the lungs every 6 (six) hours as needed.  Marland Kitchen albuterol (PROVENTIL) (2.5 MG/3ML) 0.083% nebulizer solution Take 3 mLs (2.5 mg total) by nebulization every 6 (six) hours as needed for wheezing or shortness of breath.  Marland Kitchen aspirin 81 MG tablet Take 81 mg by mouth daily. 1 tablet daily  . atenolol (TENORMIN) 25 MG tablet Take 1 tablet (25 mg total) by mouth daily.  . budesonide-formoterol (SYMBICORT) 160-4.5 MCG/ACT inhaler Inhale 2 puffs into the lungs 2 (two) times daily.  . cetirizine (ZYRTEC) 10 MG tablet Take 10 mg by mouth daily as needed.  . fluticasone (FLONASE) 50 MCG/ACT nasal spray Place 1 spray into both nostrils daily. As needed  . isosorbide mononitrate (IMDUR) 30 MG 24 hr tablet Take 1 tablet (30 mg total) by mouth daily.  Marland Kitchen lisinopril-hydrochlorothiazide (PRINZIDE,ZESTORETIC) 20-12.5 MG per  tablet Take 1 tablet by mouth daily.  . metFORMIN (GLUCOPHAGE) 500 MG tablet Take 1,000 mg by mouth 2 (two) times daily with a meal. Extended release  . naproxen (NAPROSYN) 500 MG tablet TAKE 1 TABLET BY MOUTH 2 TIMES DAILY WITH  A MEAL  . omeprazole (PRILOSEC) 20 MG capsule Take 20 mg by mouth as needed.   . simvastatin (ZOCOR) 20 MG tablet Take 20 mg by mouth daily.   . Tiotropium Bromide Monohydrate (SPIRIVA RESPIMAT) 1.25 MCG/ACT AERS Inhale 2 puffs into the lungs daily.  . TRUE METRIX BLOOD GLUCOSE TEST test strip   . varenicline (CHANTIX STARTING MONTH PAK) 0.5 MG X 11 & 1 MG X 42 tablet TAKE 0.5MG TAB BY MOUTH ONCE DAILY X3 DAYS, TAKE 0.5MG TAB 2X A DAY X4 DAYS, THEN TAKE 1MG TAB 2 TIMES A DAY THEREAFTER    Allergies: Penicillins  Social History   Tobacco Use  . Smoking status: Current Every Day Smoker    Packs/day: 0.30    Years: 40.00    Pack years: 12.00    Types: Cigarettes    Start date: 12/15/1971  . Smokeless tobacco: Never Used  . Tobacco comment: feb.2017- tried Chantix- made her nauseous. Has the nicotine patch- not started  Substance Use Topics  . Alcohol use: No    Alcohol/week: 0.0 standard drinks  . Drug use: No    Family History  Problem Relation Age of Onset  . Hypertension Mother   . Cancer Mother        lung  . Cancer Father        prostate  . Hyperlipidemia Father   . Heart attack Father 62  . Cancer Brother        prostate  . Cirrhosis Sister   . Hepatitis Sister   . Breast cancer Paternal Aunt     Review of Systems: A 12-system review of systems was performed and was negative except as noted in the HPI.  --------------------------------------------------------------------------------------------------  Physical Exam: BP 110/70 (BP Location: Left Arm, Patient Position: Sitting, Cuff Size: Normal)   Pulse 79   Ht 5' 3"  (1.6 m)   Wt 188 lb 4 oz (85.4 kg)   BMI 33.35 kg/m   General:  NAD HEENT: No conjunctival pallor or scleral icterus. Moist mucous membranes.  OP clear. Neck: Supple without lymphadenopathy, thyromegaly, JVD, or HJR. Lungs: Normal work of breathing. Coarse breath sounds with good air movement.  No wheezes or crackles. Heart: Regular rate and  rhythm without murmurs, rubs, or gallops. Non-displaced PMI. Abd: Bowel sounds present. Soft, NT/ND without hepatosplenomegaly Ext: No lower extremity edema. Radial, PT, and DP pulses are 2+ bilaterally. Skin: Warm and dry without rash.  EKG:  NSR without abnormalities.  Lab Results  Component Value Date   WBC 11.6 (H) 11/28/2017   HGB 15.2 11/28/2017   HCT 45.6 11/28/2017   MCV 86.8 11/28/2017   PLT 250 11/28/2017    Lab Results  Component Value Date   NA 134 (L) 09/04/2018   K 3.8 09/04/2018   CL 95 (L) 09/04/2018   CO2 29 09/04/2018   BUN 12 09/04/2018   CREATININE 1.08 (H) 09/04/2018   GLUCOSE 138 (H) 09/04/2018   ALT 19 09/24/2016    No results found for: CHOL, HDL, LDLCALC, LDLDIRECT, TRIG, CHOLHDL  --------------------------------------------------------------------------------------------------  ASSESSMENT AND PLAN: CAD with stable angina Previously reported chest tightness has resolved with addition of isosorbide mononitrate.  Cardiac CTA after our last visit showed severe stenosis involving  rPL branch but otherwise no significant disease.  We have discussed continued medical therapy versus cardiac catheterization and possible setting.  Given resolution of chest pain in the setting of single-vessel CAD, we have agreed to continue with medical therapy, including antianginal therapy with atenolol and isosorbide mononitrate.  She should continue indefinite low-dose ASA.  I have advised her to contact us if her CP returns so that we can readdress catheterization.  Bloating and dyspnea on exertion Non-specific but could be due to fluid retention/heart failure (including right heart failure with history of lung disease).  We have agreed to obtain a TTE and initiate furosemide 20 mg daily as needed for bloating/swelling or weight gain.  Hyperlipidemia Monica Oliver is now on simvastatin, as she did not tolerate atorvastatin due to myalgias.  I think it is reasonable to continue  this as long as target LDL of < 70 can be achieved.  If not, we would need to consider switching to rosuvastatin or adding ezetimibe or PCSK9 inhibitor.  She is scheduled for lipid panel with her PCP next month.   Hypertension BP well-controlled.  Continue current medications.  Aortic atherosclerosis Noted on CT imaging.  Continue medical therapy, including ASA, lipid control, and antihypertensive therapy.  Follow-up: Return to clinic in 3 months.  [name], MD 10/10/2018 2:17 PM

## 2018-10-16 DIAGNOSIS — G4733 Obstructive sleep apnea (adult) (pediatric): Secondary | ICD-10-CM | POA: Diagnosis not present

## 2018-10-22 ENCOUNTER — Other Ambulatory Visit: Payer: Self-pay

## 2018-10-22 ENCOUNTER — Ambulatory Visit (INDEPENDENT_AMBULATORY_CARE_PROVIDER_SITE_OTHER): Payer: Self-pay | Admitting: Physician Assistant

## 2018-10-22 ENCOUNTER — Emergency Department: Payer: 59

## 2018-10-22 ENCOUNTER — Encounter: Payer: Self-pay | Admitting: Emergency Medicine

## 2018-10-22 ENCOUNTER — Emergency Department
Admission: EM | Admit: 2018-10-22 | Discharge: 2018-10-22 | Disposition: A | Discharge status: Home / Self Care | Payer: 59 | Attending: Emergency Medicine | Admitting: Emergency Medicine

## 2018-10-22 ENCOUNTER — Encounter: Payer: Self-pay | Admitting: Physician Assistant

## 2018-10-22 VITALS — BP 144/83 | HR 91 | Temp 98.3°F | Resp 24 | Ht 62.0 in | Wt 187.0 lb

## 2018-10-22 DIAGNOSIS — E119 Type 2 diabetes mellitus without complications: Secondary | ICD-10-CM | POA: Diagnosis not present

## 2018-10-22 DIAGNOSIS — R29810 Facial weakness: Secondary | ICD-10-CM

## 2018-10-22 DIAGNOSIS — I10 Essential (primary) hypertension: Secondary | ICD-10-CM | POA: Diagnosis not present

## 2018-10-22 DIAGNOSIS — J3489 Other specified disorders of nose and nasal sinuses: Secondary | ICD-10-CM | POA: Diagnosis not present

## 2018-10-22 DIAGNOSIS — J45909 Unspecified asthma, uncomplicated: Secondary | ICD-10-CM | POA: Diagnosis not present

## 2018-10-22 DIAGNOSIS — I25111 Atherosclerotic heart disease of native coronary artery with angina pectoris with documented spasm: Secondary | ICD-10-CM | POA: Diagnosis not present

## 2018-10-22 DIAGNOSIS — F17210 Nicotine dependence, cigarettes, uncomplicated: Secondary | ICD-10-CM | POA: Diagnosis not present

## 2018-10-22 DIAGNOSIS — Z7982 Long term (current) use of aspirin: Secondary | ICD-10-CM | POA: Insufficient documentation

## 2018-10-22 DIAGNOSIS — H9202 Otalgia, left ear: Secondary | ICD-10-CM | POA: Diagnosis not present

## 2018-10-22 DIAGNOSIS — Z7984 Long term (current) use of oral hypoglycemic drugs: Secondary | ICD-10-CM | POA: Diagnosis not present

## 2018-10-22 DIAGNOSIS — Z79899 Other long term (current) drug therapy: Secondary | ICD-10-CM | POA: Insufficient documentation

## 2018-10-22 DIAGNOSIS — G510 Bell's palsy: Secondary | ICD-10-CM | POA: Diagnosis not present

## 2018-10-22 MED ORDER — TRAMADOL HCL 50 MG PO TABS: 50.0000 mg | ORAL_TABLET | Freq: Four times a day (QID) | ORAL | 0 refills | Status: DC | PRN

## 2018-10-22 MED ORDER — VALACYCLOVIR HCL 500 MG PO TABS
1000.0000 mg | ORAL_TABLET | Freq: Once | ORAL | Status: AC
  Administered 2018-10-22: 1000 mg via ORAL
  Filled 2018-10-22: qty 2

## 2018-10-22 MED ORDER — VALACYCLOVIR HCL 1 G PO TABS: 1000.0000 mg | ORAL_TABLET | Freq: Three times a day (TID) | ORAL | 0 refills | Status: DC

## 2018-10-22 MED ORDER — TRAMADOL HCL 50 MG PO TABS
50.0000 mg | ORAL_TABLET | Freq: Once | ORAL | Status: AC
  Administered 2018-10-22: 50 mg via ORAL
  Filled 2018-10-22: qty 1

## 2018-10-22 MED ORDER — PREDNISONE 10 MG PO TABS: 60.0000 mg | ORAL_TABLET | Freq: Every day | ORAL | 0 refills | Status: DC

## 2018-10-22 MED ORDER — PREDNISONE 20 MG PO TABS
60.0000 mg | ORAL_TABLET | Freq: Once | ORAL | Status: AC
  Administered 2018-10-22: 60 mg via ORAL
  Filled 2018-10-22: qty 3

## 2018-10-22 NOTE — ED Notes (Addendum)
Pt back from CT

## 2018-10-22 NOTE — ED Triage Notes (Signed)
First Nurse Note:  Arrives with c/o sinus pain left ear pain x 1 week.  Patient also with left facial droop.  Abient unsure of onset of facial symptoms.

## 2018-10-22 NOTE — Discharge Instructions (Addendum)
Please follow up with your primary care provider this week.  Take the medications as prescribed and until finished. Return to the ER for symptoms that change or worsen if unable to see primary care or the specialist.

## 2018-10-22 NOTE — ED Triage Notes (Signed)
L facial pain x 1 week. States want to MD and sent her here telling she had facial droop. She has not noticed droop.

## 2018-10-22 NOTE — ED Notes (Signed)
Pt verbalized understanding of d/c instructions, rx and f/u care, no further questions. Pt ambulatory to the exit with steady gait.

## 2018-10-22 NOTE — Progress Notes (Addendum)
Patient ID: [name] DOB: [birth year] AGE: 68 y.o. MRN: [id number]   PCP: Monica Kuba, MD   Chief Complaint:  Chief Complaint  Patient presents with  . Facial Pain    x5d  . Otitis Media    x5d (Left ear)     Subjective:    HPI:  Monica Oliver is a 68 y.o. female presents for evaluation  Chief Complaint  Patient presents with  . Facial Pain    x5d  . Otitis Media    x5d (Left ear)    68 year old female presents to [facility] with five day history of left ear pain. Located deep in left ear. Began as moderate, has become more severe over past few days. Dull during the day, severe at night. Caused such significant pain last night that patient cried. Describes as stabbing with associated pressure. Also felt was worse during rain storm. Associated pain along lateral aspect of left neck. Associated swollen sensation of left eye and left maxillary sinus/face area. Patient with seasonal allergies; uses Flonase and Zyrtec daily. Patient has applied warm compress to face/ear, taken OTC Sudafed, and taken OTC Tylenol with no symptom relief. Patient has used over the counter ear drops (name unknown) with no relief. Patient denies ear injury/trauma. Denies previous history of similar symptoms. Denies fever, chills, headache, tinnitus, ear discharge/drainage, nasal congestion, rhinorrhea, sore throat, cough, dizziness/lightheadedness, nausea/vomiting, arm/leg weakness or paresthesias, gait abnormality.  Patient with no previous history of CVA. No previous history of Bell's Palsy. No associated rash. Denies electric shock quality to pain. Patient does have previous history of herpes zoster; left T9 dermatome on 07/17/2018, treated with Famciclovir.  Patient seen by cardiologist, Dr. [name] MD with [facility], on 10/10/2018. Patient with months of left upper chest pain radiating to left arm. Cardiac CTA showed single-vessel CAD with hemodynamically  significant stenosis in rPL branch. Patient was prescribed isosorbide mononitrate to take at night, which provided some chest pain relief; however, patient believes may be cause of ear pain (is listed as possible side effect). Blood pressure 110/70 at cardiology appointment (144/83 today).  Patient seen at [facility] one month ago, 09/24/2018, with 3-day history of URI symptoms. Primary complaint cough. Due to patient's underlying COPD, prescribed 5-day course of prednisone 40mg  qd and Tessalon Perles. Gave patient wait & hold script for Doxycycline.  Patient with COPD. Managed by Dr. Flora Lipps MD with [facility]. Last seen 07/17/2018. Patient smokes 1/2 ppd. On Symbicort and has albuterol inhaler. Patient with allergic rhinitis, uses Flonase and Zyrtec. Patient's last COPD exacerbation on 10/23/2017. Previous to that episode, was 09/15/2017. Patient with pneumonia in December 2017 (left upper lobe); managed outpatient.  Patient with DM2. On Metformin. A1C in October 2017 was 6.4%. Cannot find a more recent A1C in Epic; patient states well controlled.  A limited review of symptoms was performed, pertinent positives and negatives as mentioned in HPI.  The following portions of the patient's history were reviewed and updated as appropriate: allergies, current medications and past medical history.  Patient Active Problem List   Diagnosis Date Noted  . Coronary artery disease of native artery of native heart with stable angina pectoris ([place]) 10/10/2018  . Stable angina ([place]) 08/10/2018  . Hyperlipidemia LDL goal <70 08/10/2018  . Essential hypertension 08/10/2018  . Aortic atherosclerosis ([place]) 08/10/2018  . Personal history of tobacco use, presenting hazards to health 06/07/2016  . COPD (chronic obstructive pulmonary disease) ([place]) 04/18/2016  .  DOE (dyspnea on exertion) 04/18/2016  . Tobacco abuse counseling 04/18/2016  . Smoker 04/18/2016  . OSA on CPAP 04/18/2016   . Diabetes ([place]) 05/04/2015    Allergies  Allergen Reactions  . Penicillins Other (See Comments)    Unsure- she's had it for so long    Current Outpatient Medications on File Prior to Visit  Medication Sig Dispense Refill  . albuterol (PROVENTIL HFA;VENTOLIN HFA) 108 (90 Base) MCG/ACT inhaler Inhale 1 puff into the lungs every 6 (six) hours as needed. 1 Inhaler 5  . albuterol (PROVENTIL) (2.5 MG/3ML) 0.083% nebulizer solution Take 3 mLs (2.5 mg total) by nebulization every 6 (six) hours as needed for wheezing or shortness of breath. 30 mL 5  . aspirin 81 MG tablet Take 81 mg by mouth daily. 1 tablet daily    . atenolol (TENORMIN) 25 MG tablet Take 1 tablet (25 mg total) by mouth daily. 180 tablet 3  . budesonide-formoterol (SYMBICORT) 160-4.5 MCG/ACT inhaler Inhale 2 puffs into the lungs 2 (two) times daily. 1 Inhaler 5  . cetirizine (ZYRTEC) 10 MG tablet Take 10 mg by mouth daily as needed.    . fluticasone (FLONASE) 50 MCG/ACT nasal spray Place 1 spray into both nostrils daily. As needed 16 g 12  . isosorbide mononitrate (IMDUR) 30 MG 24 hr tablet Take 1 tablet (30 mg total) by mouth daily. 90 tablet 3  . lisinopril-hydrochlorothiazide (PRINZIDE,ZESTORETIC) 20-12.5 MG per tablet Take 1 tablet by mouth daily.    . metFORMIN (GLUCOPHAGE) 500 MG tablet Take 1,000 mg by mouth 2 (two) times daily with a meal. Extended release    . omeprazole (PRILOSEC) 20 MG capsule Take 20 mg by mouth as needed.     . simvastatin (ZOCOR) 20 MG tablet Take 20 mg by mouth daily.   4  . TRUE METRIX BLOOD GLUCOSE TEST test strip   99  . furosemide (LASIX) 20 MG tablet Take 1 tablet (20 mg total) by mouth daily. (Patient not taking: Reported on 10/22/2018) 90 tablet 3   No current facility-administered medications on file prior to visit.        Objective:   Vitals:   10/22/18 1519  BP: (!) 144/83  Pulse: 91  Resp: (!) 24  Temp: 98.3 F (36.8 C)  SpO2: 98%     Wt Readings from Last 3 Encounters:   10/22/18 187 lb (84.8 kg)  10/10/18 188 lb 4 oz (85.4 kg)  09/24/18 186 lb (84.4 kg)    Physical Exam:   General Appearance:  Patient sitting comfortably on examination table. Conversational. Monica Oliver. In no acute distress. Afebrile.   Head:  Normocephalic, without obvious abnormality, atraumatic Facial inspection reveals slight drooping of left upper eyelid. No edema, erythema, or ecchymosis. No tearing. Patient able to rise both eyebrows and close eyes.   Eyes:  PERRL, conjunctiva/corneas clear, EOM's intact  Ears:  Bilateral ear canals WNL. No erythema or edema. No discharge/drainage. Bilateral TMs WNL. No erythema, injection, or serous effusion. No scar tissue. No tenderness with palpation over mastoid. No pain with manipulation of auricle. Patient reports mild discomfort with palpation over left tragus. No pain with insertion of otoscope.  Nose: Nares normal, septum midline. No discharge. Normal mucosa. Facial inspection reveals slight lax/droopy appearance to skin over left cheek. Smile elicits decreased raise in left side of mouth. Mild tenderness with palpation over left maxillary area.  Throat: Lips, mucosa, and tongue normal; teeth and gums normal. Patient with upper dentures. No teeth. No  gum edema or erythema or bleeding or purulent drainage. Denies pain with mastication. Throat reveals no erythema. Tonsils with no enlargement or exudate.  Neck: Supple, symmetrical, trachea midline, no adenopathy  Lungs:   Clear to auscultation bilaterally, respirations unlabored  Heart:  Regular rate and rhythm, S1 and S2 normal, no murmur, rub, or gallop  Extremities: Extremities normal, atraumatic, no cyanosis or edema  Pulses: 2+ and symmetric  Skin: Skin color, texture, turgor normal, no rashes or lesions  Lymph nodes: Cervical, supraclavicular, and axillary nodes normal  Neurologic: Tongue and uvula midline. Patient able to puff both cheeks. No pronator drift. Negative Romberg.  5/5 grip strength. No gait abnormality.    Assessment & Plan:    Exam findings, diagnosis etiology and medication use and indications reviewed with patient. Follow-Up and discharge instructions provided. No emergent/urgent issues found on exam.  Patient education was provided.   Patient verbalized understanding of information provided and agrees with plan of care (POC), all questions answered. The patient is advised to call or return to clinic if condition does not see an improvement in symptoms, or to seek the care of the closest emergency department if condition worsens with the below plan.    1. Acute otalgia, left  2. Facial droop  Patient with five day history of worsening left ear pain, primarily at night. New onset of left sided facial droop (noticeable in left upper eyelid, left cheek, and with raise of left side of smile). Patient noticed change as well, suspected due to swelling. Patient with history of CAD, hyperlipidemia, HTN. No previous CVA history. No gait abnormality, imbalance, confusion, slurred speech. Negative pronator drift, left sided arm/leg motor strength testing, and Romberg - however, cannot rule out CVA. Feel further evaluation would be warranted; imaging.  Had long discussion with patient; multiple possibilities of etiology of ear pain and facial drooping, consider ETD, serous otitis media, periapical abscess, sinusitis, Herpes Zoster (prodromal symptoms), Trigeminal Neuralgia, Bells Palsy - however, given concern for CVA, recommend immediate evaluation at ED. Patient declined. 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 Patient capable of making decision. Advised patient call daughter, go straight to ER anyway, patient stated she would go to ER this evening.   Monica Oliver, MHS, PA-C Monica Oliver, MHS, PA-C Advanced Practice Provider [facility]  [address] [facility], [place], [place]  [address] (p):  [phone] [email or web address]

## 2018-10-22 NOTE — Patient Instructions (Addendum)
Thank you for choosing [facility] for your health care needs.  Due to associated concerning symptoms with your left ear pain, recommend you go directly to the ED for further evaluation.  You have been advised to go straight to the ED from [place]. I will call [facility] ED and let them know your symptoms and to expect you.  Hope you feel better soon!  Earache, Adult An earache, or ear pain, can be caused by many things, including:  An infection.  Ear wax buildup.  Ear pressure.  Something in the ear that should not be there (foreign body).  A sore throat.  Tooth problems.  Jaw problems. Treatment of the earache will depend on the cause. If the cause is not clear or cannot be determined, you may need to watch your symptoms until your earache goes away or until a cause is found. Follow these instructions at home: Pay attention to any changes in your symptoms. Take these actions to help with your pain:  Take or apply over-the-counter and prescription medicines only as told by your health care provider.  If you were prescribed an antibiotic medicine, use it as told by your health care provider. Do not stop using the antibiotic even if you start to feel better.  Do not put anything in your ear other than medicine that is prescribed by your health care provider.  If directed, apply heat to the affected area as often as told by your health care provider. Use the heat source that your health care provider recommends, such as a moist heat pack or a heating pad. ? Place a towel between your skin and the heat source. ? Leave the heat on for 20-30 minutes. ? Remove the heat if your skin turns bright red. This is especially important if you are unable to feel pain, heat, or cold. You may have a greater risk of getting burned.  If directed, put ice on the ear: ? Put ice in a plastic bag. ? Place a towel between your skin and the bag. ? Leave the ice on for 20 minutes, 2-3 times a day.  Try  resting in an upright position instead of lying down. This may help to reduce pressure in your ear and relieve pain.  Chew gum if it helps to relieve your ear pain.  Treat any allergies as told by your health care provider.  Keep all follow-up visits as told by your health care provider. This is important. Contact a health care provider if:  Your pain does not improve within 2 days.  Your earache gets worse.  You have new symptoms.  You have a fever. Get help right away if:  You have a severe headache.  You have a stiff neck.  You have trouble swallowing.  You have redness or swelling behind your ear.  You have fluid or blood coming from your ear.  You have hearing loss.  You feel dizzy. This information is not intended to replace advice given to you by your health care provider. Make sure you discuss any questions you have with your health care provider. Document Released: 04/29/2004 Document Revised: 05/10/2016 Document Reviewed: 03/07/2016 Elsevier Interactive Patient Education  [employer].

## 2018-10-23 ENCOUNTER — Telehealth: Payer: Self-pay | Admitting: Physician Assistant

## 2018-10-23 NOTE — Telephone Encounter (Signed)
Called patient. Patient seen at ED yesterday evening. Diagnosed with Bell's Palsy. States she's glad she went to the ED. Ear pain is feeling better; on Valtrex and Prednisone. Had Tramadol rx filled just in case, but has not taken. Advised patient f/u with PCP, urgent care, or [facility] with any new/concerning symptoms or if her symptoms do not continue to improve. Patient understands and agrees. SFS PA-C.

## 2018-10-23 NOTE — ED Provider Notes (Signed)
[facility] Emergency Department Provider Note ____________________________________________   None    (approximate)  I have reviewed the triage vital signs and the nursing notes.   HISTORY  Chief Complaint Facial Pain   HPI Monica Oliver is a 68 y.o. female who presents to the emergency department for treatment and evaluation of facial droop on the left side.  She states that last night she had severe pain that started on the left side of her nose and radiated into her left ear.  Upon awakening this morning, she still did not feel very well and decided to go to the employee clinic.  Upon evaluation, it was called to her attention that she had some drooping to the left eye and the left corner of her mouth.  She herself did not notice, she just thought her face was swollen due to a questionable sinus infection.  She was advised by the employee health PA to come to the emergency department for further evaluation.    Past Medical History:  Diagnosis Date  . Allergy    environmental  . Arthritis   . Asthma   . COPD (chronic obstructive pulmonary disease) ([place])   . Diabetes mellitus without complication ([place])   . GERD (gastroesophageal reflux disease)   . Hyperlipidemia   . Hypertension   . Sleep apnea    C-Pap machine    Patient Active Problem List   Diagnosis Date Noted  . Coronary artery disease of native artery of native heart with stable angina pectoris ([place]) 10/10/2018  . Stable angina ([place]) 08/10/2018  . Hyperlipidemia LDL goal <70 08/10/2018  . Essential hypertension 08/10/2018  . Aortic atherosclerosis ([place]) 08/10/2018  . Personal history of tobacco use, presenting hazards to health 06/07/2016  . COPD (chronic obstructive pulmonary disease) ([place]) 04/18/2016  . DOE (dyspnea on exertion) 04/18/2016  . Tobacco abuse counseling 04/18/2016  . Smoker 04/18/2016  . OSA on CPAP 04/18/2016  . Diabetes ([place]) 05/04/2015    Past Surgical History:    Procedure Laterality Date  . CESAREAN SECTION     1986  . CHOLECYSTECTOMY     1993    Prior to Admission medications   Medication Sig Start Date End Date Taking? Authorizing Provider  albuterol (PROVENTIL HFA;VENTOLIN HFA) 108 (90 Base) MCG/ACT inhaler Inhale 1 puff into the lungs every 6 (six) hours as needed. 06/23/16   Mungal, Roxanne Mins, MD  albuterol (PROVENTIL) (2.5 MG/3ML) 0.083% nebulizer solution Take 3 mLs (2.5 mg total) by nebulization every 6 (six) hours as needed for wheezing or shortness of breath. 10/23/17   Flora Lipps, MD  aspirin 81 MG tablet Take 81 mg by mouth daily. 1 tablet daily    [provider]  atenolol (TENORMIN) 25 MG tablet Take 1 tablet (25 mg total) by mouth daily. 09/18/18 12/17/18  End, Harrell Gave, MD  budesonide-formoterol [facility]) 160-4.5 MCG/ACT inhaler Inhale 2 puffs into the lungs 2 (two) times daily. 12/28/17   Flora Lipps, MD  cetirizine (ZYRTEC) 10 MG tablet Take 10 mg by mouth daily as needed.    [provider]  fluticasone (FLONASE) 50 MCG/ACT nasal spray Place 1 spray into both nostrils daily. As needed 03/03/16   Caryn Section Linden Dolin, PA-C  furosemide (LASIX) 20 MG tablet Take 1 tablet (20 mg total) by mouth daily. Patient not taking: Reported on 10/22/2018 10/10/18 01/08/19  End, Harrell Gave, MD  isosorbide mononitrate (IMDUR) 30 MG 24 hr tablet Take 1 tablet (30 mg total) by mouth daily. 09/13/18  12/12/18  End, Harrell Gave, MD  lisinopril-hydrochlorothiazide (PRINZIDE,ZESTORETIC) 20-12.5 MG per tablet Take 1 tablet by mouth daily.    [provider]  metFORMIN (GLUCOPHAGE) 500 MG tablet Take 1,000 mg by mouth 2 (two) times daily with a meal. Extended release    [provider]  omeprazole (PRILOSEC) 20 MG capsule Take 20 mg by mouth as needed.     [provider]  predniSONE (DELTASONE) 10 MG tablet Take 6 tablets (60 mg total) by mouth daily. 10/22/18   Aryan Sparks B, FNP  simvastatin (ZOCOR) 20 MG tablet Take  20 mg by mouth daily.  08/28/18   [provider]  traMADol (ULTRAM) 50 MG tablet Take 1 tablet (50 mg total) by mouth every 6 (six) hours as needed. 10/22/18   Victorino Dike, FNP  TRUE METRIX BLOOD GLUCOSE TEST test strip  02/19/16   [provider]  valACYclovir (VALTREX) 1000 MG tablet Take 1 tablet (1,000 mg total) by mouth 3 (three) times daily. 10/22/18   Victorino Dike, FNP    Allergies Penicillins  Family History  Problem Relation Age of Onset  . Hypertension Mother   . Cancer Mother        lung  . Cancer Father        prostate  . Hyperlipidemia Father   . Heart attack Father 25  . Cancer Brother        prostate  . Cirrhosis Sister   . Hepatitis Sister   . Breast cancer Paternal Aunt     Social History Social History   Tobacco Use  . Smoking status: Current Every Day Smoker    Packs/day: 0.30    Years: 40.00    Pack years: 12.00    Types: Cigarettes    Start date: 12/15/1971  . Smokeless tobacco: Never Used  . Tobacco comment: feb.2017- tried Chantix- made her nauseous. Has the nicotine patch- not started  Substance Use Topics  . Alcohol use: No    Alcohol/week: 0.0 standard drinks  . Drug use: No    Review of Systems  Constitutional: No fever/chills Eyes: No visual changes. ENT: No sore throat.  Negative for rhinorrhea Cardiovascular: Denies chest pain. Respiratory: Denies shortness of breath. Gastrointestinal: No abdominal pain.  No nausea, no vomiting.  No diarrhea.  No constipation. Genitourinary: Negative for dysuria. Musculoskeletal: Negative for back pain. Skin: Negative for rash. Neurological: Negative for headaches, focal weakness or numbness. ____________________________________________   PHYSICAL EXAM:  VITAL SIGNS: ED Triage Vitals  Enc Vitals Group     BP 10/22/18 1710 (!) 144/58     Pulse Rate 10/22/18 1710 91     Resp 10/22/18 1710 20     Temp 10/22/18 1710 98.5 F (36.9 C)     Temp Source 10/22/18 1710 Oral       SpO2 10/22/18 1710 98 %     Weight 10/22/18 1711 187 lb (84.8 kg)     Height 10/22/18 1711 5\' 3"  (1.6 m)     Head Circumference --      Peak Flow --      Pain Score 10/22/18 1711 4     Pain Loc --      Pain Edu? --      Excl. in [place]? --     Constitutional: Alert and oriented. Well appearing and in no acute distress. Eyes: Conjunctivae are normal. PERRL. EOMI without pain. Eyelids completely close.  No nystagmus. Eyebrows raise symmetrically. Head: Atraumatic. Nose: No congestion/rhinnorhea. Mouth/Throat: Mucous  membranes are moist.  Slight drooping of the corner of the mouth on the left side.  Tongue protrudes midline.  Smile is slightly asymmetric with a slight droop to the corner of the left side.  Neck: No stridor.   Cardiovascular: Normal rate, regular rhythm. Grossly normal heart sounds.  Good peripheral circulation. Respiratory: Normal respiratory effort.  No retractions. Lungs CTAB. Gastrointestinal: Soft and nontender. No distention. No abdominal bruits. No CVA tenderness. Musculoskeletal: No lower extremity tenderness nor edema.  No joint effusions. Neurologic:  Normal speech and language. No gross focal neurologic deficits are appreciated. No gait instability. Skin:  Skin is warm, dry and intact. No rash noted. Psychiatric: Mood and affect are normal. Speech and behavior are normal.  ____________________________________________   LABS (all labs ordered are listed, but only abnormal results are displayed)  Labs Reviewed - No data to display ____________________________________________  EKG  Not indicated. ____________________________________________  RADIOLOGY  ED MD interpretation:  No acute concerns on CT of the head or maxillofacial images.  Official radiology report(s): Ct Head Wo Contrast  Result Date: 10/22/2018 CLINICAL DATA:  Left ear and sinus pain the past week. Left facial droop. EXAM: CT HEAD WITHOUT CONTRAST CT MAXILLOFACIAL WITHOUT CONTRAST  TECHNIQUE: Multidetector CT imaging of the head and maxillofacial structures were performed using the standard protocol without intravenous contrast. Multiplanar CT image reconstructions of the maxillofacial structures were also generated. COMPARISON:  CT head dated Jan 31, 2011. FINDINGS: CT HEAD FINDINGS Brain: No evidence of acute infarction, hemorrhage, hydrocephalus, extra-axial collection or mass lesion/mass effect. Vascular: No hyperdense vessel or unexpected calcification. Skull: Normal. Negative for fracture or focal lesion. Other: None. CT MAXILLOFACIAL FINDINGS Osseous: No fracture or mandibular dislocation. No destructive process. Orbits: Negative. No traumatic or inflammatory finding. Sinuses: Mild left maxillary, frontal, and ethmoid air cell mucosal thickening. No air-fluid levels. Small mucous retention cyst in the right sphenoid sinus. The bilateral mastoid air cells and middle ear cavities are clear. Soft tissues: Negative. IMPRESSION: 1. Mild left-sided paranasal sinus disease without air-fluid levels. 2. Clear bilateral mastoid air cells and middle ear cavities. 3.  No acute intracranial abnormality. Electronically Signed   By: Titus Dubin M.D.   On: 10/22/2018 21:23   Ct Maxillofacial Wo Contrast  Result Date: 10/22/2018 CLINICAL DATA:  Left ear and sinus pain the past week. Left facial droop. EXAM: CT HEAD WITHOUT CONTRAST CT MAXILLOFACIAL WITHOUT CONTRAST TECHNIQUE: Multidetector CT imaging of the head and maxillofacial structures were performed using the standard protocol without intravenous contrast. Multiplanar CT image reconstructions of the maxillofacial structures were also generated. COMPARISON:  CT head dated Jan 31, 2011. FINDINGS: CT HEAD FINDINGS Brain: No evidence of acute infarction, hemorrhage, hydrocephalus, extra-axial collection or mass lesion/mass effect. Vascular: No hyperdense vessel or unexpected calcification. Skull: Normal. Negative for fracture or focal lesion.  Other: None. CT MAXILLOFACIAL FINDINGS Osseous: No fracture or mandibular dislocation. No destructive process. Orbits: Negative. No traumatic or inflammatory finding. Sinuses: Mild left maxillary, frontal, and ethmoid air cell mucosal thickening. No air-fluid levels. Small mucous retention cyst in the right sphenoid sinus. The bilateral mastoid air cells and middle ear cavities are clear. Soft tissues: Negative. IMPRESSION: 1. Mild left-sided paranasal sinus disease without air-fluid levels. 2. Clear bilateral mastoid air cells and middle ear cavities. 3.  No acute intracranial abnormality. Electronically Signed   By: Titus Dubin M.D.   On: 10/22/2018 21:23    ____________________________________________   PROCEDURES  Procedure(s) performed: None  Procedures  Critical Care performed: No  ____________________________________________   INITIAL IMPRESSION / ASSESSMENT AND PLAN / ED COURSE  As part of my medical decision making, I reviewed the following data within the electronic MEDICAL RECORD NUMBER Notes from employee clinic visit.    68 year old female presenting to the emergency department for treatment and evaluation of left side facial droop with pain overlying the maxillary sinus on the left side.  Exam is most consistent with Bell's palsy, however with the pain that the patient described it may be an early herpes zoster without rash as a presenting symptom at this time.  CTs of the head and maxillofacial was performed and are reassuring.  She will be treated with valacyclovir and prednisone.  She is to call and schedule an appointment with her primary care provider and was also given follow-up information for neurology.  Patient was advised to return to the emergency department for any symptom that changes, worsens, or for new concerns if unable to see primary care.      ____________________________________________   FINAL CLINICAL IMPRESSION(S) / ED DIAGNOSES  Final diagnoses:    Bell's palsy     ED Discharge Orders         Ordered    valACYclovir (VALTREX) 1000 MG tablet  3 times daily     10/22/18 2147    predniSONE (DELTASONE) 10 MG tablet  Daily     10/22/18 2147    traMADol (ULTRAM) 50 MG tablet  Every 6 hours PRN     10/22/18 2201           Note:  This document was prepared using Dragon voice recognition software and may include unintentional dictation errors.    Victorino Dike, FNP 10/23/18 7373    Eula Listen, MD 10/24/18 2249

## 2018-10-29 ENCOUNTER — Encounter: Payer: Self-pay | Admitting: Internal Medicine

## 2018-10-29 DIAGNOSIS — R079 Chest pain, unspecified: Secondary | ICD-10-CM | POA: Diagnosis not present

## 2018-10-29 DIAGNOSIS — E119 Type 2 diabetes mellitus without complications: Secondary | ICD-10-CM | POA: Diagnosis not present

## 2018-10-29 DIAGNOSIS — F411 Generalized anxiety disorder: Secondary | ICD-10-CM | POA: Diagnosis not present

## 2018-10-29 DIAGNOSIS — E781 Pure hyperglyceridemia: Secondary | ICD-10-CM | POA: Diagnosis not present

## 2018-10-29 DIAGNOSIS — R51 Headache: Secondary | ICD-10-CM | POA: Diagnosis not present

## 2018-10-29 DIAGNOSIS — F17210 Nicotine dependence, cigarettes, uncomplicated: Secondary | ICD-10-CM | POA: Diagnosis not present

## 2018-10-29 DIAGNOSIS — J329 Chronic sinusitis, unspecified: Secondary | ICD-10-CM | POA: Diagnosis not present

## 2018-10-29 DIAGNOSIS — I10 Essential (primary) hypertension: Secondary | ICD-10-CM | POA: Diagnosis not present

## 2018-10-29 DIAGNOSIS — G510 Bell's palsy: Secondary | ICD-10-CM | POA: Diagnosis not present

## 2018-10-30 ENCOUNTER — Telehealth: Payer: Self-pay | Admitting: Internal Medicine

## 2018-10-30 NOTE — Telephone Encounter (Signed)
Pt c/o medication issue:  1. Name of Medication: isosorbide mononitrate   2. How are you currently taking this medication (dosage and times per day)? 30 MG - 1 tablet daily   3. Are you having a reaction (difficulty breathing--STAT)? yes  4. What is your medication issue? States face hurts, pain in left side and when patient stops medication does not have same issues.  Please call to discuss.

## 2018-10-31 NOTE — Telephone Encounter (Signed)
No answer. Left message to call back.   

## 2018-11-01 NOTE — Telephone Encounter (Signed)
No answer. I left a message for the patient to call back.

## 2018-11-06 ENCOUNTER — Ambulatory Visit: Payer: 59 | Admitting: Internal Medicine

## 2018-11-06 ENCOUNTER — Other Ambulatory Visit: Payer: Self-pay | Admitting: Internal Medicine

## 2018-11-06 ENCOUNTER — Ambulatory Visit (INDEPENDENT_AMBULATORY_CARE_PROVIDER_SITE_OTHER): Payer: 59

## 2018-11-06 ENCOUNTER — Encounter: Payer: Self-pay | Admitting: Internal Medicine

## 2018-11-06 VITALS — BP 136/80 | HR 75 | Ht 63.0 in | Wt 187.4 lb

## 2018-11-06 DIAGNOSIS — Z720 Tobacco use: Secondary | ICD-10-CM

## 2018-11-06 DIAGNOSIS — J449 Chronic obstructive pulmonary disease, unspecified: Secondary | ICD-10-CM | POA: Diagnosis not present

## 2018-11-06 DIAGNOSIS — R0609 Other forms of dyspnea: Secondary | ICD-10-CM | POA: Diagnosis not present

## 2018-11-06 DIAGNOSIS — G4733 Obstructive sleep apnea (adult) (pediatric): Secondary | ICD-10-CM

## 2018-11-06 DIAGNOSIS — F17210 Nicotine dependence, cigarettes, uncomplicated: Secondary | ICD-10-CM

## 2018-11-06 DIAGNOSIS — R0600 Dyspnea, unspecified: Secondary | ICD-10-CM

## 2018-11-06 MED ORDER — VARENICLINE TARTRATE 0.5 MG X 11 & 1 MG X 42 PO MISC: ORAL | 0 refills | Status: DC

## 2018-11-06 NOTE — Telephone Encounter (Signed)
Patient in lobby Patient came for ECHO and has an appointment with Dr Mortimer Fries in pulmonary Would like to speak with a nurse face to face while here  Please advise

## 2018-11-06 NOTE — Patient Instructions (Addendum)
Mask fitting Referral  Recommend PCP doctor send to ENT  Continue CPAP as prescribed  Continue inhalers as prescribed  STOP SMOKING!! Chantix prescribed

## 2018-11-06 NOTE — Progress Notes (Signed)
PULMONARY OFFICE FOLLOW UP NOTE  PROBLEMS:  Mild COPD Smoker  DATA: LDCT 06/07/16: normal PFTs 06/13/16: mild obstruction with borderline significant improvement after bronchodilator. Normal TLC. Mildly reduced DLCO  INTERVAL HISTORY: Previously seen by VM and diagnosed with mild COPD.    CC  follow up COPD Follow up OSA   HPI Patient with diagnosis of sleep apnea Auto CPAP therapy 5 to 20 cm water pressure Compliance report shows 100% for days and greater than 4 hours She uses her CPAP and is benefiting from therapy AHI is 0.6  Patient still currently smoking Smoking cessation strongly advised  No signs of exacerbation at this time No signs of infection No signs of heart failure COPD seems to be stable at this time  Patient with diagnosis of left-sided Bell's palsy Completed prednisone therapy Has issues with mask as it was causing pain She wears a full facemask  CT chest in 07/24/2018 Few scattered undetermined bilateral nodules however I could not see any nodules at this time Follow-up CT chest in 6 months for interval assessment   Smoking Assessment and Cessation Counseling   Upon further questioning, Patient smokes 1ppd  I have advised patient to quit/stop smoking as soon as possible due to high risk for multiple medical problems  Patient is willing to quit smoking Chantix ordered  I have advised patient that we can assist and have options of Nicotine replacement therapy. I also advised patient on behavioral therapy and can provide oral medication therapy in conjunction with the other therapies  Follow up next Office visit  for assessment of smoking cessation  Smoking cessation counseling advised for 4 minutes     BP 136/80 (BP Location: Left Arm, Cuff Size: Normal)   Pulse 75   Ht 5\' 3"  (1.6 m)   Wt 187 lb 6.4 oz (85 kg)   SpO2 98%   BMI 33.20 kg/m      Review of Systems:  Gen:  Denies  fever, sweats, chills weigh loss  HEENT: Denies  blurred vision, double vision, ear pain, eye pain, hearing loss, nose bleeds, sore throat +facial pain from bell's palsy Cardiac:  No dizziness, chest pain or heaviness, chest tightness,edema, No JVD Resp:   No cough, -sputum production, -shortness of breath,-wheezing, -hemoptysis,  Gi: Denies swallowing difficulty, stomach pain, nausea or vomiting, diarrhea, constipation, bowel incontinence Gu:  Denies bladder incontinence, burning urine Ext:   Denies Joint pain, stiffness or swelling Skin: Denies  skin rash, easy bruising or bleeding or hives Endoc:  Denies polyuria, polydipsia , polyphagia or weight change Psych:   Denies depression, insomnia or hallucinations  Other:  All other systems negative   BP 136/80 (BP Location: Left Arm, Cuff Size: Normal)   Pulse 75   Ht 5\' 3"  (1.6 m)   Wt 187 lb 6.4 oz (85 kg)   SpO2 98%   BMI 33.20 kg/m     Physical Examination:   GENERAL:NAD, no fevers, chills, no weakness no fatigue HEAD: Normocephalic, atraumatic.  EYES: PERLA, EOMI No scleral icterus.  MOUTH: Moist mucosal membrane.  EAR, NOSE, THROAT: Clear without exudates. No external lesions.  NECK: Supple. No thyromegaly.  No JVD.  PULMONARY: CTA B/L no wheezing, rhonchi, crackles CARDIOVASCULAR: S1 and S2. Regular rate and rhythm. No murmurs GASTROINTESTINAL: Soft, nontender, nondistended. Positive bowel sounds.  MUSCULOSKELETAL: No swelling, clubbing, or edema.  NEUROLOGIC: No gross focal neurological deficits. 5/5 strength all extremities SKIN: No ulceration, lesions, rashes, or cyanosis.  PSYCHIATRIC: Insight, judgment intact. -depression -anxiety  ALL OTHER ROS ARE NEGATIVE          DATA:   CXR 09/24/16: vague left upper lobe opacity   CXR 11/10/16 : resolution of LUL opacity CT chest 07/16/2018-and discrete bilateral pulmonary nodules not visualized but reported Mild lymphadenopathy anterior tracheal space      IMPRESSION: 68 year old female with mild COPD Gold  stage a with underlying sleep apnea with ongoing tobacco abuse in the setting of obesity and deconditioned state  #1 COPD mild Gold stage a Continue inhalers as prescribed with Symbicort and Spiriva Albuterol as needed   #2 OSA Patient has excellent compliance report and is benefiting from auto CPAP therapy however at this time she has issues with mask fitting as she has a recent diagnosis of Bell's palsy Recommend mask fitting referral services to [facility] [address]   #3 smoking cessation strongly advised   #4 CT chest with discrete pulmonary nodules with some anterior tracheal adenopathy Recommend CT chest in 6 months    #5 obesity -recommend significant weight loss -recommend changing diet  #6 deconditioned state -Recommend increased daily activity and exercise    Patient  satisfied with Plan of action and management. All questions answered Follow-up in 6 months   Giovanni Bath Patricia Pesa, M.D.  [facility]  Medical Director [place] Director [facility] Cardio-Pulmonary Department

## 2018-11-06 NOTE — Telephone Encounter (Signed)
Thank you for the update.  While nitrates can cause headaches, I am not sure why she continues to have intermittent symptoms despite stopping the medication.  I think it is fine to hold of on restarting isosorbide mononitrate at this time.  She should let us know if her facial pain and neck stiffness do not improve.  [name], MD [facility] Pager: [phone]

## 2018-11-06 NOTE — Telephone Encounter (Signed)
Unable to see patient in the lobby. Called patient. States she stopped the isosorbide sometime after seeing Dr End on 10/10/18. She initially started isosorbide around 09/18/18 and tolerated it well until the end of January. Patient started having neck tightness/stiffness and pain on the left side of her face to the degree that she could barely open her mouth. Patient tried a day without taking the isosorbide and she didn't have the neck and face problems. Then the next day she took isosorbide and then the pain in face and neck tightness/stiffness returned. She ended up going to the ED on 10/22/18. She was diagnosed with Bell's Palsy. Patient is now not taking isosorbide and still having the symptoms. She denies chest tightness, chest pain, dizziness or shortness of breath. Patient wanted to let Dr End know about what had happened. Advised I will route to Dr End.

## 2018-11-07 ENCOUNTER — Telehealth: Payer: Self-pay | Admitting: *Deleted

## 2018-11-07 DIAGNOSIS — J019 Acute sinusitis, unspecified: Secondary | ICD-10-CM | POA: Diagnosis not present

## 2018-11-07 DIAGNOSIS — R51 Headache: Secondary | ICD-10-CM | POA: Diagnosis not present

## 2018-11-07 NOTE — Telephone Encounter (Signed)
-----   Message from [name], MD sent at 11/06/2018 10:05 PM EST ----- Please let Ms. Parke know that her echo shows that her heart is contracting well.  There is mild stiffening of the left ventricle, which may be related to her history of hypertension and sleep apnea.  She should continue her current medications.  If symptoms worsen, she should let us know.  Otherwise, we will follow-up as planned.

## 2018-11-07 NOTE — Telephone Encounter (Signed)
No answer. Left message to call back.  Results released to My Chart.  

## 2018-11-09 ENCOUNTER — Encounter: Payer: Self-pay | Admitting: *Deleted

## 2018-11-09 NOTE — Telephone Encounter (Signed)
No answer. Left message to call back if any further questions after viewing results on MyChart or receiving the letter. Mailing letter with the results as well.

## 2018-11-09 NOTE — Telephone Encounter (Signed)
No answer. Left message to call back.   

## 2018-11-13 ENCOUNTER — Other Ambulatory Visit (HOSPITAL_BASED_OUTPATIENT_CLINIC_OR_DEPARTMENT_OTHER): Payer: Self-pay

## 2018-11-14 NOTE — Telephone Encounter (Signed)
Patient returning call.

## 2018-11-14 NOTE — Telephone Encounter (Signed)
Patient made aware of results and verbalized understanding.  

## 2018-11-14 NOTE — Telephone Encounter (Signed)
No answer. Left message to call back if having any further issues.

## 2019-01-03 ENCOUNTER — Telehealth: Payer: Self-pay | Admitting: Internal Medicine

## 2019-01-03 NOTE — Telephone Encounter (Signed)
Evisit Consent Pending Mychart Response

## 2019-01-04 NOTE — Progress Notes (Signed)
Virtual Visit via Video Note   This visit type was conducted due to national recommendations for restrictions regarding the COVID-19 Pandemic (e.g. social distancing) in an effort to limit this patient's exposure and mitigate transmission in our community.  Due to her co-morbid illnesses, this patient is at least at moderate risk for complications without adequate follow up.  This format is felt to be most appropriate for this patient at this time.  All issues noted in this document were discussed and addressed.  A limited physical exam was performed with this format.  Please refer to the patient's chart for her consent to telehealth for [facility].   Evaluation Performed:  Follow-up visit  Date:  01/09/2019   ID:  Monica Oliver, DOB 18-Jun-1951, MRN [id number]  Patient Location: Home  Provider Location: Office  PCP:  Petra Kuba, MD  Cardiologist:  [name], MD  Electrophysiologist:  None   Chief Complaint:  Follow-up coronary artery disease  History of Present Illness:    Monica Oliver is a 68 y.o. female who presents via audio/video conferencing for a telehealth visit today.  She has a history of stable angina, HTN, HLD, DM2, COPD, OSA, shingles, and tobacco use.  We are following up today in regard to her coronary artery disease.  I last saw Monica Oliver in January after cardiac CTA revealed significant single-vessel CAD with CT FFR positive lesion in the distal RCA/RPL branch.  At that time, Monica Oliver noted that her chest discomfort had resolved with addition of isosorbide mononitrate.  Exertional dyspnea was stable.  Due to some abdominal bloating, furosemide 40 mg daily as needed for swelling/bloating was prescribed.  We agreed to defer catheterization.  Today, Monica Oliver reports feeling well.  She denies chest pain and has stable dyspnea with strenuous activities.  She stopped taking Imdur due to achiness and generalized fatigue; symptoms have resolved.  She denies  palpitations, lightheadedness, and edema.  She has not needed to take furosemide.  Labs by her PCP reportedly showed some kidney issues, which her PCP attributed to the addition of furosemide.  However, the patient had not taken any furosemide when the labs were drawn.  Home BP is typically 366-440 mmHg systolic.  The patient does not have symptoms concerning for COVID-19 infection (fever, chills, cough, or new shortness of breath).    Past Medical History:  Diagnosis Date  . Allergy    environmental  . Arthritis   . Asthma   . COPD (chronic obstructive pulmonary disease) ([place])   . Diabetes mellitus without complication ([place])   . GERD (gastroesophageal reflux disease)   . Hyperlipidemia   . Hypertension   . Sleep apnea    C-Pap machine   Past Surgical History:  Procedure Laterality Date  . CESAREAN SECTION     1986  . CHOLECYSTECTOMY     1993     Current Meds  Medication Sig  . albuterol (PROVENTIL HFA;VENTOLIN HFA) 108 (90 Base) MCG/ACT inhaler Inhale 1 puff into the lungs every 6 (six) hours as needed.  Marland Kitchen albuterol (PROVENTIL) (2.5 MG/3ML) 0.083% nebulizer solution Take 3 mLs (2.5 mg total) by nebulization every 6 (six) hours as needed for wheezing or shortness of breath.  Marland Kitchen aspirin 81 MG tablet Take 81 mg by mouth daily. 1 tablet daily  . atenolol (TENORMIN) 25 MG tablet Take 1 tablet (25 mg total) by mouth daily.  . budesonide-formoterol (SYMBICORT) 160-4.5 MCG/ACT inhaler Inhale 2 puffs into the lungs 2 (two)  times daily.  . cetirizine (ZYRTEC) 10 MG tablet Take 10 mg by mouth daily as needed.  . fluticasone (FLONASE) 50 MCG/ACT nasal spray Place 1 spray into both nostrils daily. As needed  . lisinopril-hydrochlorothiazide (PRINZIDE,ZESTORETIC) 20-12.5 MG per tablet Take 1 tablet by mouth daily.  . metFORMIN (GLUCOPHAGE) 500 MG tablet Take 1,000 mg by mouth 2 (two) times daily with a meal. Extended release  . omeprazole (PRILOSEC) 20 MG capsule Take 20 mg by mouth as  needed.   . simvastatin (ZOCOR) 20 MG tablet Take 20 mg by mouth daily.   Marland Kitchen SPIRIVA RESPIMAT 1.25 MCG/ACT AERS INHALE 2 PUFFS INTO THE LUNGS DAILY.  Marland Kitchen TRUE METRIX BLOOD GLUCOSE TEST test strip   . valACYclovir (VALTREX) 1000 MG tablet Take 1 tablet (1,000 mg total) by mouth 3 (three) times daily.     Allergies:   Penicillins   Social History   Tobacco Use  . Smoking status: Current Every Day Smoker    Packs/day: 0.50    Years: 40.00    Pack years: 20.00    Types: Cigarettes    Start date: 12/15/1971  . Smokeless tobacco: Never Used  . Tobacco comment: feb.2017- tried Chantix- made her nauseous. Has the nicotine patch- not started  Substance Use Topics  . Alcohol use: No    Alcohol/week: 0.0 standard drinks  . Drug use: No     Family Hx: The patient's family history includes Breast cancer in her paternal aunt; Cancer in her brother, father, and mother; Cirrhosis in her sister; Heart attack (age of onset: 20) in her father; Hepatitis in her sister; Hyperlipidemia in her father; Hypertension in her mother.  ROS:   Please see the history of present illness.  All other systems reviewed and are negative.   Prior CV studies:   The following studies were reviewed today:  Non-Invasive Evaluation(s):  Cardiac CTA (09/10/18): CAC score 306 (91st percentile).  LMCA normal.  LAD normal.  LCx relatively small with <50% stenosis in mid vessel.  Small ramus normal.  RCA with <50% stenosis in mid vessel.  51-69% stenosis in distal RCA/rPL (CTFFR of rPL 0.52).   Right lower extremity venous duplex (04/22/2011): No evidence of DVT in the right lower extremity.  Labs/Other Tests and Data Reviewed:    EKG:  No ECG reviewed.  Recent Labs: 09/04/2018: BUN 12; Creatinine, Ser 1.08; Potassium 3.8; Sodium 134   Recent Lipid Panel No results found for: CHOL, TRIG, HDL, CHOLHDL, LDLCALC, LDLDIRECT  Wt Readings from Last 3 Encounters:  01/09/19 184 lb (83.5 kg)  11/06/18 187 lb 6.4 oz (85 kg)   10/22/18 187 lb (84.8 kg)     Objective:    Vital Signs:  BP 140/64 (BP Location: Left Arm, Patient Position: Sitting, Cuff Size: Normal)   Pulse 78   Ht 5\' 3"  (1.6 m)   Wt 184 lb (83.5 kg)   SpO2 99%   BMI 32.59 kg/m    Well nourished, well developed female in no acute distress.  ASSESSMENT & PLAN:   Coronary artery disease with stable angina: No further chest pain, with known severe single vessel CAD involving distal RCA/rPL branch.  Patient was intolerant of Imdur.  Given lack of significant chest pain, we will continue current dose of atenolol 25 mg daily for antianginal therapy (she did not tolerate higher dose of atenolol either due to fatigue.  Continue secondary prevention.  Hypertension: BP suboptimally controlled today, consistent with other home readings.  Goal BP < 130/80,  given DM.  We discussed increasing lisinopril-HCTZ.  However, I would like to obtain recent labs from Dr. Shade Flood, given patient's report of kidney issues.  Based on results, we may need to repeat a BMP before escalating lisinopril-HCTZ.  Hyperlipidemia: Continue statin therapy; lipids being followed by PCP.  Goal LDL is less than 70.  Tobacco use: Continued smoking cessation encouraged.  COVID-19 Education: The signs and symptoms of COVID-19 were discussed with the patient and how to seek care for testing (follow up with PCP or arrange E-visit).  The importance of social distancing was discussed today.  Time:   Today, I have spent 12 minutes with the patient with telehealth technology discussing the above problems.  An additional 10 minutes were spent reviewing the patient's chart and performing documentation.   Medication Adjustments/Labs and Tests Ordered: Current medicines are reviewed at length with the patient today.  Concerns regarding medicines are outlined above.   Tests Ordered: None.  Medication Changes: None.  Disposition:  Follow up in 4 month(s)  Signed, [name], MD   01/09/2019 9:43 AM    [facility]

## 2019-01-08 ENCOUNTER — Telehealth: Payer: Self-pay | Admitting: Internal Medicine

## 2019-01-08 NOTE — Telephone Encounter (Signed)
Virtual Visit Pre-Appointment Phone Call  Steps For Call:  1. Confirm consent - "In the setting of the current Covid19 crisis, you are scheduled for a (phone or video) visit with your provider on (date) at (time).  Just as we do with many in-office visits, in order for you to participate in this visit, we must obtain consent.  If you'd like, I can send this to your mychart (if signed up) or email for you to review.  Otherwise, I can obtain your verbal consent now.  All virtual visits are billed to your insurance company just like a normal visit would be.  By agreeing to a virtual visit, we'd like you to understand that the technology does not allow for your provider to perform an examination, and thus may limit your provider's ability to fully assess your condition.  Finally, though the technology is pretty good, we cannot assure that it will always work on either your or our end, and in the setting of a video visit, we may have to convert it to a phone-only visit.  In either situation, we cannot ensure that we have a secure connection.  Are you willing to proceed?" STAFF: Did the patient verbally acknowledge consent to telehealth visit? Document YES/NO: Yes   2. Confirm the BEST phone number to call the day of the visit by including in appointment notes yes  3. Give patient instructions for WebEx/MyChart download to smartphone as below or Doximity/Doxy.me if video visit (depending on what platform provider is using)  4. Advise patient to be prepared with their blood pressure, heart rate, weight, any heart rhythm information, their current medicines, and a piece of paper and pen handy for any instructions they may receive the day of their visit  5. Inform patient they will receive a phone call 15 minutes prior to their appointment time (may be from unknown caller ID) so they should be prepared to answer  6. Confirm that appointment type is correct in Epic appointment notes (VIDEO vs  PHONE)     TELEPHONE CALL NOTE  Monica Oliver has been deemed a candidate for a follow-up tele-health visit to limit community exposure during the Covid-19 pandemic. I spoke with the patient via phone to ensure availability of phone/video source, confirm preferred email & phone number, and discuss instructions and expectations.  I reminded Monica Oliver to be prepared with any vital sign and/or heart rhythm information that could potentially be obtained via home monitoring, at the time of her visit. I reminded Monica Oliver to expect a phone call at the time of her visit if her visit.  Ace Gins 01/08/2019 3:43 PM   INSTRUCTIONS FOR DOWNLOADING THE Center APP TO SMARTPHONE  - If Apple, ask patient to go to App Store and type in WebEx in the search bar. [place] [employer], the blue/green circle. If Android, go to [employer] and type in [employer] in the search bar. The app is free but as with any other app downloads, their phone may require them to verify saved payment information or Apple/Android password.  - The patient does NOT have to create an account. - On the day of the visit, the assist will walk the patient through joining the meeting with the meeting number/password.  INSTRUCTIONS FOR DOWNLOADING THE MYCHART APP TO SMARTPHONE  - The patient must first make sure to have activated MyChart and know their login information - If Apple, go to [employer] and type in  MyChart in the search bar and download the app. If Android, ask patient to go to [employer] and type in [place] in the search bar and download the app. The app is free but as with any other app downloads, their phone may require them to verify saved payment information or Apple/Android password.  - The patient will need to then log into the app with their MyChart username and password, and select [facility] as their healthcare provider to link the account. When it is time for your visit, go to the  MyChart app, find appointments, and click Begin Video Visit. Be sure to Select Allow for your device to access the Microphone and Camera for your visit. You will then be connected, and your provider will be with you shortly.  **If they have any issues connecting, or need assistance please contact MyChart service desk [phone])**  **If using a computer, in order to ensure the best quality for their visit they will need to use either of the following Internet Browsers: [employer], or Google Chrome**  IF USING DOXIMITY or DOXY.ME - The patient will receive a link just prior to their visit, either by text or email (to be determined day of appointment depending on if it's doxy.me or Doximity).     FULL LENGTH CONSENT FOR TELE-HEALTH VISIT   I hereby voluntarily request, consent and authorize [facility] and its employed or contracted physicians, physician assistants, nurse practitioners or other licensed health care professionals (the Practitioner), to provide me with telemedicine health care services (the Services") as deemed necessary by the treating Practitioner. I acknowledge and consent to receive the Services by the Practitioner via telemedicine. I understand that the telemedicine visit will involve communicating with the Practitioner through live audiovisual communication technology and the disclosure of certain medical information by electronic transmission. I acknowledge that I have been given the opportunity to request an in-person assessment or other available alternative prior to the telemedicine visit and am voluntarily participating in the telemedicine visit.  I understand that I have the right to withhold or withdraw my consent to the use of telemedicine in the course of my care at any time, without affecting my right to future care or treatment, and that the Practitioner or I may terminate the telemedicine visit at any time. I understand that I have the right to  inspect all information obtained and/or recorded in the course of the telemedicine visit and may receive copies of available information for a reasonable fee.  I understand that some of the potential risks of receiving the Services via telemedicine include:   Delay or interruption in medical evaluation due to technological equipment failure or disruption;  Information transmitted may not be sufficient (e.g. poor resolution of images) to allow for appropriate medical decision making by the Practitioner; and/or   In rare instances, security protocols could fail, causing a breach of personal health information.  Furthermore, I acknowledge that it is my responsibility to provide information about my medical history, conditions and care that is complete and accurate to the best of my ability. I acknowledge that Practitioner's advice, recommendations, and/or decision may be based on factors not within their control, such as incomplete or inaccurate data provided by me or distortions of diagnostic images or specimens that may result from electronic transmissions. I understand that the practice of medicine is not an exact science and that Practitioner makes no warranties or guarantees regarding treatment outcomes. I acknowledge that I will receive a copy  of this consent concurrently upon execution via email to the email address I last provided but may also request a printed copy by calling the office of [facility].    I understand that my insurance will be billed for this visit.   I have read or had this consent read to me.  I understand the contents of this consent, which adequately explains the benefits and risks of the Services being provided via telemedicine.   I have been provided ample opportunity to ask questions regarding this consent and the Services and have had my questions answered to my satisfaction.  I give my informed consent for the services to be provided through the use of  telemedicine in my medical care  By participating in this telemedicine visit I agree to the above.

## 2019-01-09 ENCOUNTER — Telehealth (INDEPENDENT_AMBULATORY_CARE_PROVIDER_SITE_OTHER): Payer: 59 | Admitting: Internal Medicine

## 2019-01-09 ENCOUNTER — Other Ambulatory Visit: Payer: Self-pay

## 2019-01-09 ENCOUNTER — Encounter: Payer: Self-pay | Admitting: Internal Medicine

## 2019-01-09 ENCOUNTER — Telehealth: Payer: Self-pay | Admitting: *Deleted

## 2019-01-09 VITALS — BP 140/64 | HR 78 | Ht 63.0 in | Wt 184.0 lb

## 2019-01-09 DIAGNOSIS — I10 Essential (primary) hypertension: Secondary | ICD-10-CM | POA: Diagnosis not present

## 2019-01-09 DIAGNOSIS — I25118 Atherosclerotic heart disease of native coronary artery with other forms of angina pectoris: Secondary | ICD-10-CM | POA: Diagnosis not present

## 2019-01-09 DIAGNOSIS — E785 Hyperlipidemia, unspecified: Secondary | ICD-10-CM | POA: Diagnosis not present

## 2019-01-09 NOTE — Patient Instructions (Signed)
Medication Instructions:  Your physician recommends that you continue on your current medications as directed. Please refer to the Current Medication list given to you today.  If you need a refill on your cardiac medications before your next appointment, please call your pharmacy.   Lab work: Labwork will be requested from your primary care physician.  If you have labs (blood work) drawn today and your tests are completely normal, you will receive your results only by: Marland Kitchen MyChart Message (if you have MyChart) OR . A paper copy in the mail If you have any lab test that is abnormal or we need to change your treatment, we will call you to review the results.  Testing/Procedures: none  Follow-Up: At [facility], you and your health needs are our priority.  As part of our continuing mission to provide you with exceptional heart care, we have created designated Provider Care Teams.  These Care Teams include your primary Cardiologist (physician) and Advanced Practice Providers (APPs -  Physician Assistants and Nurse Practitioners) who all work together to provide you with the care you need, when you need it. You will need a follow up appointment in 4 months.  Please call our office 2 months in advance to schedule this appointment.  You may see [name], MD or one of the following Advanced Practice Providers on your designated Care Team:   Murray Hodgkins, NP Christell Faith, PA-C . Marrianne Mood, PA-C

## 2019-01-09 NOTE — Telephone Encounter (Signed)
Patient calling back to give me her PCP - Dr Sena Hitch at [facility] [phone]).   Called [place] to request lab work.  Receptionist said she will fax the results of BMET to [place]. Fax number verified.  Waiting for results.

## 2019-01-09 NOTE — Telephone Encounter (Signed)
Have not received lab work yet.   Called again and spoke to [place]. She took information and verified our fax number and will send a message to medical records to send them to our office.

## 2019-01-11 ENCOUNTER — Telehealth: Payer: Self-pay | Admitting: Internal Medicine

## 2019-01-11 DIAGNOSIS — I10 Essential (primary) hypertension: Secondary | ICD-10-CM

## 2019-01-11 DIAGNOSIS — I25118 Atherosclerotic heart disease of native coronary artery with other forms of angina pectoris: Secondary | ICD-10-CM

## 2019-01-11 NOTE — Telephone Encounter (Signed)
No answer. Left message to call back.   

## 2019-01-11 NOTE — Telephone Encounter (Signed)
Patient called back and verbalized understanding of plan of care to increase lisinopril/HCTZ to 40-25 mg daily and to go to the [facility] on 01/25/19 for BMP. BMP order entered and med list updated.

## 2019-01-11 NOTE — Telephone Encounter (Signed)
Notes recorded by End, Harrell Gave, MD on 01/10/2019 at 3:48 PM EDT Outside BMP from 10/30/2018 shows creatinine of 1.04, which is at baseline and K 4.6. Given suboptimal BP control noted at yesterday's visit, please have Monica Oliver increase her lisinopril/HCTZ 20-12.5 to two tablets daily (for total dose of 40-25 mg). We should have her come in for a BMP in ~2 weeks. Thanks.

## 2019-01-11 NOTE — Telephone Encounter (Signed)
Lab work received and addressed. See telephone note from today.

## 2019-01-11 NOTE — Telephone Encounter (Signed)
Attempted to call the patient with recommendations from Dr. Saunders Revel. I left a message for her to please call back.

## 2019-01-15 DIAGNOSIS — G4733 Obstructive sleep apnea (adult) (pediatric): Secondary | ICD-10-CM | POA: Diagnosis not present

## 2019-01-28 ENCOUNTER — Other Ambulatory Visit
Admission: RE | Admit: 2019-01-28 | Discharge: 2019-01-28 | Disposition: A | Discharge status: Home / Self Care | Payer: 59 | Source: Ambulatory Visit | Attending: Internal Medicine | Admitting: Internal Medicine

## 2019-01-28 DIAGNOSIS — I10 Essential (primary) hypertension: Secondary | ICD-10-CM

## 2019-01-28 DIAGNOSIS — I25118 Atherosclerotic heart disease of native coronary artery with other forms of angina pectoris: Secondary | ICD-10-CM | POA: Diagnosis not present

## 2019-01-28 LAB — BASIC METABOLIC PANEL
Anion gap: 12 (ref 5–15)
BUN: 19 mg/dL (ref 8–23)
CO2: 27 mmol/L (ref 22–32)
Calcium: 9.5 mg/dL (ref 8.9–10.3)
Chloride: 93 mmol/L — ABNORMAL LOW (ref 98–111)
Creatinine, Ser: 1.1 mg/dL — ABNORMAL HIGH (ref 0.44–1.00)
GFR calc Af Amer: >60 mL/min (ref >60)
GFR calc non Af Amer: 52 mL/min — ABNORMAL LOW (ref >60)
Glucose, Bld: 88 mg/dL (ref 70–99)
Potassium: 3.6 mmol/L (ref 3.5–5.1)
Sodium: 132 mmol/L — ABNORMAL LOW (ref 135–145)

## 2019-01-28 NOTE — Telephone Encounter (Signed)
Patient missed blood work on 5/1 but will be going after work on 5/4 to have drawn

## 2019-01-29 ENCOUNTER — Telehealth: Payer: Self-pay | Admitting: *Deleted

## 2019-01-29 NOTE — Telephone Encounter (Signed)
It is fine for her to take it at bedtime or BID.  However, this may exacerbate nocturia.  If frequent urination is a problem, we may need to go back to 20/12.5 mg QAM and add an alternate agent for blood pressure control.  [name], MD [facility] Pager: [phone]

## 2019-01-29 NOTE — Telephone Encounter (Signed)
-----   Message from [name], MD sent at 01/29/2019  8:45 AM EDT ----- Please let Monica Oliver know that her labs are stable, with mild hyponatremia and slight creatinine elevation noted.  I recommend that she continue her current medications and monitor her blood pressure at home.  She should let us know if it is consistently above 130/80.

## 2019-01-29 NOTE — Telephone Encounter (Signed)
Patient verbalized understanding of the results and recommendations to monitor BP and call us if consistently greater than 130/80.  She wanted to let Dr End know she did increase the lisinopril to 40/25. However, she found when she took them both at the same time, she felt tired and sluggish. Therefore, she decided to take lisinopril 20/12.5 mg two times a day and she did not have that side effect. She did try taking it at the same time for 3 days in a row. Recommended she could take both tablets at bedtime as well. Routing to Dr End for his review.

## 2019-01-30 NOTE — Telephone Encounter (Signed)
No answer. Left message to call back.   

## 2019-01-30 NOTE — Telephone Encounter (Signed)
Patient is returning your call.  

## 2019-01-31 MED ORDER — LISINOPRIL-HYDROCHLOROTHIAZIDE 20-12.5 MG PO TABS: 1.0000 | ORAL_TABLET | Freq: Two times a day (BID) | ORAL | 1 refills | Status: DC

## 2019-01-31 NOTE — Addendum Note (Signed)
Addended by: Vanessa Ralphs on: 01/31/2019 08:30 AM   Modules accepted: Orders

## 2019-01-31 NOTE — Telephone Encounter (Signed)
No answer. Left message to call back.   Sent patient a MyChart message with Dr Darnelle Bos recommendations as well.

## 2019-01-31 NOTE — Telephone Encounter (Signed)
Patient calling back and verbalized understanding of Dr Darnelle Bos recommendations. So far she is doing fine with taking the lisinopril/HCTZ two times a day. She will let us know if any issues with urination arise. Med list updated.

## 2019-03-07 ENCOUNTER — Other Ambulatory Visit: Payer: Self-pay

## 2019-03-07 ENCOUNTER — Ambulatory Visit (INDEPENDENT_AMBULATORY_CARE_PROVIDER_SITE_OTHER): Payer: Self-pay | Admitting: Physician Assistant

## 2019-03-07 VITALS — BP 105/80 | HR 90 | Temp 97.7°F | Resp 18 | Wt 183.2 lb

## 2019-03-07 DIAGNOSIS — R300 Dysuria: Secondary | ICD-10-CM

## 2019-03-07 DIAGNOSIS — N390 Urinary tract infection, site not specified: Secondary | ICD-10-CM

## 2019-03-07 LAB — POCT URINALYSIS DIPSTICK
Bilirubin, UA: NEGATIVE
Blood, UA: NEGATIVE
Glucose, UA: NEGATIVE
Ketones, UA: NEGATIVE
Leukocytes, UA: NEGATIVE
Nitrite, UA: NEGATIVE
Protein, UA: NEGATIVE
Spec Grav, UA: 1.01 (ref 1.010–1.025)
Urobilinogen, UA: 0.2 E.U./dL
pH, UA: 6.5 (ref 5.0–8.0)

## 2019-03-07 MED ORDER — NITROFURANTOIN MONOHYD MACRO 100 MG PO CAPS: 100.0000 mg | ORAL_CAPSULE | Freq: Two times a day (BID) | ORAL | 0 refills | Status: AC

## 2019-03-07 MED ORDER — PHENAZOPYRIDINE HCL 200 MG PO TABS: 200.0000 mg | ORAL_TABLET | Freq: Three times a day (TID) | ORAL | 0 refills | Status: DC | PRN

## 2019-03-07 NOTE — Progress Notes (Signed)
Patient ID: MOZEL BURDETT DOB: 26-Feb-1951 AGE: 68 y.o. MRN: [id number]   PCP: Petra Kuba, MD   Chief Complaint:  Chief Complaint  Patient presents with  . Dysuria    2 days     Subjective:    HPI:  Monica Oliver is a 68 y.o. female presents for evaluation  Chief Complaint  Patient presents with  . Dysuria    57 days   68 year old female presents to [facility] with 2 day history of dysuria. Most severe with first morning urination. Urine, in the arm, darker, cloudy, and with slight foul odor. Associated increased urinary frequency, urinary urgency, nocturia, and suprapubic pressure. Associated mild low back ache, midline. Has taken leftover/previously prescribed Gabapentin for pain relief. Has increased water consumption. States dysuria in the morning is a 6/10 severity. Denies fever, chills, sweats, body aches, nausea/vomiting, abdominal pain, flank pain, vaginal rash/discharge/pruritis, gross hematuria. Patient has had four children; normally has mild urge and stress incontinence, wears pantiliner. Patient recently began taking Lisinopril/HCTZ at night; on 01/31/2019. Was warned may cause exacerbation of nocturia. No history of nephrolithiasis. Last UTI ~10 years ago. Denies new sexual partner/concern for STD. Patient is post-menopausal, almost a decade. Denies vaginal bleeding. Patient is a cigarette smoker.  Patient regularly seen by Dr. Sena Hitch MD, [facility]. Treated for HTN, COPD, OSA (on CPAP), DM2 (non-insulin dependent), and hyperlipidemia. Patient on Metformin. States last A1C ?Marland Kitchen Also regularly seen by Dr. [name] MD with [facility]. Managed for CAD.  A limited review of symptoms was performed, pertinent positives and negatives as mentioned in HPI.  The following portions of the patient's history were reviewed and updated as appropriate: allergies, current medications and past medical history.  Patient Active  Problem List   Diagnosis Date Noted  . Coronary artery disease of native artery of native heart with stable angina pectoris ([place]) 10/10/2018  . Stable angina ([place]) 08/10/2018  . Hyperlipidemia LDL goal <70 08/10/2018  . Essential hypertension 08/10/2018  . Aortic atherosclerosis ([place]) 08/10/2018  . Personal history of tobacco use, presenting hazards to health 06/07/2016  . COPD (chronic obstructive pulmonary disease) ([place]) 04/18/2016  . DOE (dyspnea on exertion) 04/18/2016  . Tobacco abuse counseling 04/18/2016  . Smoker 04/18/2016  . OSA on CPAP 04/18/2016  . Diabetes ([place]) 05/04/2015    Allergies  Allergen Reactions  . Penicillins Other (See Comments)    Unsure- she's had it for so long    Current Outpatient Medications on File Prior to Visit  Medication Sig Dispense Refill  . albuterol (PROVENTIL HFA;VENTOLIN HFA) 108 (90 Base) MCG/ACT inhaler Inhale 1 puff into the lungs every 6 (six) hours as needed. 1 Inhaler 5  . albuterol (PROVENTIL) (2.5 MG/3ML) 0.083% nebulizer solution Take 3 mLs (2.5 mg total) by nebulization every 6 (six) hours as needed for wheezing or shortness of breath. 30 mL 5  . aspirin 81 MG tablet Take 81 mg by mouth daily. 1 tablet daily    . budesonide-formoterol (SYMBICORT) 160-4.5 MCG/ACT inhaler Inhale 2 puffs into the lungs 2 (two) times daily. 1 Inhaler 5  . cetirizine (ZYRTEC) 10 MG tablet Take 10 mg by mouth daily as needed.    . fluticasone (FLONASE) 50 MCG/ACT nasal spray Place 1 spray into both nostrils daily. As needed 16 g 12  . gabapentin (NEURONTIN) 100 MG capsule Take 100 mg by mouth 3 (three) times daily.    Marland Kitchen lisinopril-hydrochlorothiazide (ZESTORETIC) 20-12.5 MG tablet Take  1 tablet by mouth 2 (two) times daily. 180 tablet 1  . metFORMIN (GLUCOPHAGE) 500 MG tablet Take 1,000 mg by mouth 2 (two) times daily with a meal. Extended release    . omeprazole (PRILOSEC) 20 MG capsule Take 20 mg by mouth as needed.     Marland Kitchen SPIRIVA RESPIMAT 1.25 MCG/ACT  AERS INHALE 2 PUFFS INTO THE LUNGS DAILY. 4 g 5  . TRUE METRIX BLOOD GLUCOSE TEST test strip   99  . atenolol (TENORMIN) 25 MG tablet Take 1 tablet (25 mg total) by mouth daily. 180 tablet 3  . simvastatin (ZOCOR) 20 MG tablet Take 20 mg by mouth daily.   4  . valACYclovir (VALTREX) 1000 MG tablet Take 1 tablet (1,000 mg total) by mouth 3 (three) times daily. (Patient not taking: Reported on 03/07/2019) 30 tablet 0  . varenicline (CHANTIX STARTING MONTH PAK) 0.5 MG X 11 & 1 MG X 42 tablet Take one 0.5 mg tablet by mouth once daily for 3 days, then increase to one 0.5 mg tablet twice daily for 4 days, then increase to one 1 mg tablet twice daily. (Patient not taking: Reported on 01/09/2019) 53 tablet 0   No current facility-administered medications on file prior to visit.        Objective:   Vitals:   03/07/19 1428  BP: 105/80  Pulse: 90  Resp: 18  Temp: 97.7 F (36.5 C)  SpO2: 96%     Wt Readings from Last 3 Encounters:  03/07/19 183 lb 3.2 oz (83.1 kg)  01/09/19 184 lb (83.5 kg)  11/06/18 187 lb 6.4 oz (85 kg)    Physical Exam:   General Appearance:  Patient sitting comfortably on examination table. Conversational. Kermit Balo self-historian. In no acute distress. Afebrile.   Head:  Normocephalic, without obvious abnormality, atraumatic  Eyes:  PERRL, conjunctiva/corneas clear, EOM's intact  Neck: Supple, symmetrical, trachea midline, no adenopathy  Lungs:   Clear to auscultation bilaterally, respirations unlabored. Good aeration. No rales, rhonchi, crackles or wheezing.  Heart:  Regular rate and rhythm, S1 and S2 normal, no murmur, rub, or gallop  Abdomen:   Normal to inspection. Abdominal distension from obesity. Normoactive bowel sounds. No tenderness with palpation. No guarding, rigidity or rebound tenderness. No palpable organomegaly. No CVA tenderness with percussion bilaterally.  Extremities: Extremities normal, atraumatic, no cyanosis or edema  Pulses: 2+ and symmetric  Skin:  Skin color, texture, turgor normal, no rashes or lesions  Lymph nodes: Cervical, supraclavicular, and axillary nodes normal  Neurologic: Normal    Assessment & Plan:    Exam findings, diagnosis etiology and medication use and indications reviewed with patient. Follow-Up and discharge instructions provided. No emergent/urgent issues found on exam.  Patient education was provided.   Patient verbalized understanding of information provided and agrees with plan of care (POC), all questions answered. The patient is advised to call or return to clinic if condition does not see an improvement in symptoms, or to seek the care of the closest emergency department if condition worsens with the below plan.    Recent Results (from the past 2160 hour(s))  Basic metabolic panel     Status: Abnormal   Collection Time: 01/28/19  4:15 PM  Result Value Ref Range   Sodium 132 (L) 135 - 145 mmol/L   Potassium 3.6 3.5 - 5.1 mmol/L   Chloride 93 (L) 98 - 111 mmol/L   CO2 27 22 - 32 mmol/L   Glucose, Bld 88 70 - 99 mg/dL  BUN 19 8 - 23 mg/dL   Creatinine, Ser 1.10 (H) 0.44 - 1.00 mg/dL   Calcium 9.5 8.9 - 10.3 mg/dL   GFR calc non Af Amer 52 (L) >60 mL/min   GFR calc Af Amer >60 >60 mL/min   Anion gap 12 5 - 15    Comment: Performed at [facility], [address] [place], [place] [address]  POCT urinalysis dipstick     Status: Normal   Collection Time: 03/07/19  2:50 PM  Result Value Ref Range   Color, UA yellow    Clarity, UA clear    Glucose, UA Negative Negative   Bilirubin, UA negative    Ketones, UA negative    Spec Grav, UA 1.010 1.010 - 1.025    Comment: 1.005   Blood, UA negative    pH, UA 6.5 5.0 - 8.0   Protein, UA Negative Negative   Urobilinogen, UA 0.2 0.2 or 1.0 E.U./dL   Nitrite, UA negative    Leukocytes, UA Negative Negative   Appearance     Odor       1. Urinary tract infection without hematuria, site unspecified  2. Dysuria - POCT urinalysis dipstick -  nitrofurantoin, macrocrystal-monohydrate, (MACROBID) 100 MG capsule; Take 1 capsule (100 mg total) by mouth 2 (two) times daily for 5 days.  Dispense: 10 capsule; Refill: 0 - phenazopyridine (PYRIDIUM) 200 MG tablet; Take 1 tablet (200 mg total) by mouth 3 (three) times daily as needed for pain.  Dispense: 10 tablet; Refill: 0  Patient with two day history of dysuria, increased urinary frequency, urinary urgency (slightly more than typical), nocturia, and suprapubic pressure. UA benign; no leukocytes, no nitrites, no blood, no glucose, sp gravity 1.005. VSS; afebrile, in no acute distress, benign abdominal examination. No CVA tenderness consistent with pyelonephritis. At this time, based on HPI, will treat patient for uncomplicated UTI. Prescribed 5-day course of Macrobid 100mg  bid. Prescribed Pyridium for symptom relief. Advised increase fluids and empty bladder often. Advised immediate follow-up at the ED with fever, chills, sweats, abdominal pain, nausea/vomiting, flank pain, or other new/concerning symptom. Advised re-evaluation by PCP, Ob/Gyn, or urgent care in 2-3 days if symptoms not resolved - may need further evaluation and treatment, including repeat UA with C&S. Patient agreed with plan.    Darlin Priestly, MHS, PA-C Montey Hora, MHS, PA-C Advanced Practice Provider [facility]  [address], [place] [address] (p): [phone] [email or web address]

## 2019-03-07 NOTE — Progress Notes (Signed)
yelpoc

## 2019-03-07 NOTE — Patient Instructions (Signed)
Thank you for choosing [facility] for your health care needs.  You have been diagnosed with a UTI (urinary tract infection).  Take medications as prescribed: Macrobid-antibiotic Pyridium-for symptom relief  1. Urinary tract infection without hematuria, site unspecified  2. Dysuria  - nitrofurantoin, macrocrystal-monohydrate, (MACROBID) 100 MG capsule; Take 1 capsule (100 mg total) by mouth 2 (two) times daily for 5 days.  Dispense: 10 capsule; Refill: 0 - phenazopyridine (PYRIDIUM) 200 MG tablet; Take 1 tablet (200 mg total) by mouth 3 (three) times daily as needed for pain.  Dispense: 10 tablet; Refill: 0  Recommend increasing fluid consumption: water or cranberry juice. Empty bladder frequently. May take Tylenol or ibuprofen for pain/discomfort.  Follow-up at ER if you develop fever, chills, sweats, back ache, abdominal pain, nausea/vomiting, or other new/concerning symptom.  Follow-up with family physician, Ob/Gyn, or urgent care in 2-3 days if symptoms have not completely resolved. Will need further evaluation and treatment. May need repeat UA with urine sent for culture and sensitivity.  Hope you feel better soon!   Urinary Tract Infection, Adult A urinary tract infection (UTI) is an infection of any part of the urinary tract. The urinary tract includes:  The kidneys.  The ureters.  The bladder.  The urethra. These organs make, store, and get rid of pee (urine) in the body. What are the causes? This is caused by germs (bacteria) in your genital area. These germs grow and cause swelling (inflammation) of your urinary tract. What increases the risk? You are more likely to develop this condition if:  You have a small, thin tube (catheter) to drain pee.  You cannot control when you pee or poop (incontinence).  You are female, and: ? You use these methods to prevent pregnancy: ? A medicine that kills sperm (spermicide). ? A device that blocks sperm (diaphragm). ? You  have low levels of a female hormone (estrogen). ? You are pregnant.  You have genes that add to your risk.  You are sexually active.  You take antibiotic medicines.  You have trouble peeing because of: ? A prostate that is bigger than normal, if you are female. ? A blockage in the part of your body that drains pee from the bladder (urethra). ? A kidney stone. ? A nerve condition that affects your bladder (neurogenic bladder). ? Not getting enough to drink. ? Not peeing often enough.  You have other conditions, such as: ? Diabetes. ? A weak disease-fighting system (immune system). ? Sickle cell disease. ? Gout. ? Injury of the spine. What are the signs or symptoms? Symptoms of this condition include:  Needing to pee right away (urgently).  Peeing often.  Peeing small amounts often.  Pain or burning when peeing.  Blood in the pee.  Pee that smells bad or not like normal.  Trouble peeing.  Pee that is cloudy.  Fluid coming from the vagina, if you are female.  Pain in the belly or lower back. Other symptoms include:  Throwing up (vomiting).  No urge to eat.  Feeling mixed up (confused).  Being tired and grouchy (irritable).  A fever.  Watery poop (diarrhea). How is this treated? This condition may be treated with:  Antibiotic medicine.  Other medicines.  Drinking enough water. Follow these instructions at home:  Medicines  Take over-the-counter and prescription medicines only as told by your doctor.  If you were prescribed an antibiotic medicine, take it as told by your doctor. Do not stop taking it even if you start  to feel better. General instructions  Make sure you: ? Pee until your bladder is empty. ? Do not hold pee for a long time. ? Empty your bladder after sex. ? Wipe from front to back after pooping if you are a female. Use each tissue one time when you wipe.  Drink enough fluid to keep your pee pale yellow.  Keep all follow-up  visits as told by your doctor. This is important. Contact a doctor if:  You do not get better after 1-2 days.  Your symptoms go away and then come back. Get help right away if:  You have very bad back pain.  You have very bad pain in your lower belly.  You have a fever.  You are sick to your stomach (nauseous).  You are throwing up. Summary  A urinary tract infection (UTI) is an infection of any part of the urinary tract.  This condition is caused by germs in your genital area.  There are many risk factors for a UTI. These include having a small, thin tube to drain pee and not being able to control when you pee or poop.  Treatment includes antibiotic medicines for germs.  Drink enough fluid to keep your pee pale yellow. This information is not intended to replace advice given to you by your health care provider. Make sure you discuss any questions you have with your health care provider. Document Released: 02/29/2008 Document Revised: 03/22/2018 Document Reviewed: 03/22/2018 Elsevier Interactive Patient Education  2019 [employer].

## 2019-04-17 ENCOUNTER — Other Ambulatory Visit: Payer: Self-pay | Admitting: Internal Medicine

## 2019-04-17 DIAGNOSIS — H52223 Regular astigmatism, bilateral: Secondary | ICD-10-CM | POA: Diagnosis not present

## 2019-04-25 ENCOUNTER — Encounter: Payer: Self-pay | Admitting: Internal Medicine

## 2019-04-25 DIAGNOSIS — F17210 Nicotine dependence, cigarettes, uncomplicated: Secondary | ICD-10-CM | POA: Diagnosis not present

## 2019-04-25 DIAGNOSIS — I10 Essential (primary) hypertension: Secondary | ICD-10-CM | POA: Diagnosis not present

## 2019-04-25 DIAGNOSIS — E781 Pure hyperglyceridemia: Secondary | ICD-10-CM | POA: Diagnosis not present

## 2019-04-25 DIAGNOSIS — E538 Deficiency of other specified B group vitamins: Secondary | ICD-10-CM | POA: Diagnosis not present

## 2019-04-25 DIAGNOSIS — Z114 Encounter for screening for human immunodeficiency virus [HIV]: Secondary | ICD-10-CM | POA: Diagnosis not present

## 2019-04-25 DIAGNOSIS — Z1389 Encounter for screening for other disorder: Secondary | ICD-10-CM | POA: Diagnosis not present

## 2019-04-25 DIAGNOSIS — E119 Type 2 diabetes mellitus without complications: Secondary | ICD-10-CM | POA: Diagnosis not present

## 2019-04-25 DIAGNOSIS — J449 Chronic obstructive pulmonary disease, unspecified: Secondary | ICD-10-CM | POA: Diagnosis not present

## 2019-04-25 DIAGNOSIS — E559 Vitamin D deficiency, unspecified: Secondary | ICD-10-CM | POA: Diagnosis not present

## 2019-06-20 ENCOUNTER — Other Ambulatory Visit: Payer: Self-pay | Admitting: Family Medicine

## 2019-06-20 DIAGNOSIS — Z1231 Encounter for screening mammogram for malignant neoplasm of breast: Secondary | ICD-10-CM

## 2019-07-03 ENCOUNTER — Ambulatory Visit: Payer: 59 | Admitting: Internal Medicine

## 2019-07-08 ENCOUNTER — Encounter: Payer: Self-pay | Admitting: Internal Medicine

## 2019-07-08 ENCOUNTER — Other Ambulatory Visit: Payer: Self-pay

## 2019-07-08 ENCOUNTER — Ambulatory Visit (INDEPENDENT_AMBULATORY_CARE_PROVIDER_SITE_OTHER): Payer: 59 | Admitting: Internal Medicine

## 2019-07-08 VITALS — BP 110/68 | HR 92 | Ht 63.0 in | Wt 174.5 lb

## 2019-07-08 DIAGNOSIS — I10 Essential (primary) hypertension: Secondary | ICD-10-CM

## 2019-07-08 DIAGNOSIS — I25118 Atherosclerotic heart disease of native coronary artery with other forms of angina pectoris: Secondary | ICD-10-CM | POA: Diagnosis not present

## 2019-07-08 DIAGNOSIS — Z720 Tobacco use: Secondary | ICD-10-CM | POA: Diagnosis not present

## 2019-07-08 DIAGNOSIS — E785 Hyperlipidemia, unspecified: Secondary | ICD-10-CM | POA: Diagnosis not present

## 2019-07-08 DIAGNOSIS — R9431 Abnormal electrocardiogram [ECG] [EKG]: Secondary | ICD-10-CM

## 2019-07-08 MED ORDER — AMLODIPINE BESYLATE 2.5 MG PO TABS: 2.5000 mg | ORAL_TABLET | Freq: Every day | ORAL | 4 refills | Status: DC

## 2019-07-08 MED ORDER — LISINOPRIL-HYDROCHLOROTHIAZIDE 20-12.5 MG PO TABS: 1.0000 | ORAL_TABLET | Freq: Every day | ORAL | 4 refills | Status: DC

## 2019-07-08 NOTE — Patient Instructions (Signed)
Medication Instructions:  Your physician has recommended you make the following change in your medication:  1- CHANGE to Lisinopril/HCTZ 1 tablet by mouth once a day. 2- START Amlodipine to 2.5 mg by mouth once a day.  If you need a refill on your cardiac medications before your next appointment, please call your pharmacy.   Lab work: NONE If you have labs (blood work) drawn today and your tests are completely normal, you will receive your results only by: Marland Kitchen MyChart Message (if you have MyChart) OR . A paper copy in the mail If you have any lab test that is abnormal or we need to change your treatment, we will call you to review the results.  Testing/Procedures: NONE  Follow-Up: At [facility], you and your health needs are our priority.  As part of our continuing mission to provide you with exceptional heart care, we have created designated Provider Care Teams.  These Care Teams include your primary Cardiologist (physician) and Advanced Practice Providers (APPs -  Physician Assistants and Nurse Practitioners) who all work together to provide you with the care you need, when you need it. You will need a follow up appointment in 6 weeks.   You may see [name], MD or one of the following Advanced Practice Providers on your designated Care Team:   Murray Hodgkins, NP Christell Faith, PA-C . Marrianne Mood, PA-C

## 2019-07-08 NOTE — Progress Notes (Signed)
Follow-up Outpatient Visit Date: 07/08/2019  Primary Care Provider: Petra Kuba, MD [place] [address]  Chief Complaint: Chest pain  HPI:  Monica Oliver is a 67 y.o. year-old female with history of stable angina, HTN, HLD, DM2, COPD, OSA, shingles, and tobacco use, who presents for follow-up of coronary artery disease.  We last spoke in April via virtual visit, at which time Monica Oliver was doing well without chest pain.  She noted stable shortness of breath with strenuous activities.  She previously stopped taking isosorbide mononitrate due to achiness and generalized fatigue, which both resolved with discontinuation of the medication.  Blood pressure was mildly elevated at that time.  We discussed increasing lisinopril-HCTZ, but Monica Oliver wished to defer this until labs could be repeated by her PCP given some renal issues in the past.  Today, Monica Oliver reports continued intermittent left-sided chest pain, which is unchanged from our last visit.  It typically happens when she is "rushing" or stressed.  She notes associated shortness of breath.  She does not have any symptoms with ADL's or at work.  Symptoms typically last a few minutes and resolve spontaneously.  She denies palpitations, orthopnea, and edema.  About a week ago, she had a single episode of transient lightheadedness while seated in bed.  There were no associated symptoms, though she felt like she was going to pass out.  --------------------------------------------------------------------------------------------------  Cardiovascular History & Procedures: Cardiovascular Problems:  Coronary artery disease  Risk Factors:  Hypertension, hyperlipidemia, diabetes mellitus, obesity, tobacco use, and age greater than 41; coronary artery and aortic atherosclerotic calcification noted on noncontrast CT of the chest (07/16/2018-personally reviewed)  Cath/PCI:  None  CV Surgery:  None  EP Procedures  and Devices:  None  Non-Invasive Evaluation(s):  TTE (11/06/2018): Normal LV size with moderate LVH.  LVEF 55-60% with grade 1 diastolic dysfunction.  Mild mitral annular calcification.  Normal RV size and function.  Unable to assess RVSP.  Cardiac CTA (09/10/18): CAC score 306 (91st percentile).  LMCA normal.  LAD normal.  LCx relatively small with <50% stenosis in mid vessel.  Small ramus normal.  RCA with <50% stenosis in mid vessel.  51-69% stenosis in distal RCA/rPL (CTFFR of rPL 0.52).   Right lower extremity venous duplex (04/22/2011): No evidence of DVT in the right lower extremity.  Recent CV Pertinent Labs: Lab Results  Component Value Date   K 3.6 01/28/2019   K 3.6 03/15/2014   BUN 19 01/28/2019   BUN 11 03/15/2014   CREATININE 1.10 (H) 01/28/2019   CREATININE 1.01 03/15/2014    Past medical and surgical history were reviewed and updated in EPIC.  Current Meds  Medication Sig  . albuterol (PROVENTIL HFA;VENTOLIN HFA) 108 (90 Base) MCG/ACT inhaler Inhale 1 puff into the lungs every 6 (six) hours as needed.  Marland Kitchen albuterol (PROVENTIL) (2.5 MG/3ML) 0.083% nebulizer solution Take 3 mLs (2.5 mg total) by nebulization every 6 (six) hours as needed for wheezing or shortness of breath.  Marland Kitchen aspirin 81 MG tablet Take 81 mg by mouth daily. 1 tablet daily  . atenolol (TENORMIN) 25 MG tablet Take 1 tablet (25 mg total) by mouth daily.  . cetirizine (ZYRTEC) 10 MG tablet Take 10 mg by mouth daily as needed.  . fluticasone (FLONASE) 50 MCG/ACT nasal spray Place 1 spray into both nostrils daily. As needed  . gabapentin (NEURONTIN) 100 MG capsule Take 100 mg by mouth as needed.   Marland Kitchen lisinopril-hydrochlorothiazide (ZESTORETIC) 20-12.5  MG tablet Take 1 tablet by mouth 2 (two) times daily.  . metFORMIN (GLUCOPHAGE) 500 MG tablet Take 1,000 mg by mouth 2 (two) times daily with a meal. Extended release  . omeprazole (PRILOSEC) 20 MG capsule Take 20 mg by mouth as needed.   . simvastatin (ZOCOR)  20 MG tablet Take 20 mg by mouth daily.   Marland Kitchen SPIRIVA RESPIMAT 1.25 MCG/ACT AERS INHALE 2 PUFFS INTO THE LUNGS DAILY.  . SYMBICORT 160-4.5 MCG/ACT inhaler INHALE 2 PUFFS INTO THE LUNGS TWO TIMES DAILY  . TRUE METRIX BLOOD GLUCOSE TEST test strip     Allergies: Penicillins  Social History   Tobacco Use  . Smoking status: Current Every Day Smoker    Packs/day: 0.50    Years: 40.00    Pack years: 20.00    Types: Cigarettes    Start date: 12/15/1971  . Smokeless tobacco: Never Used  . Tobacco comment: feb.2017- tried Chantix- made her nauseous. Has the nicotine patch- not started  Substance Use Topics  . Alcohol use: No    Alcohol/week: 0.0 standard drinks  . Drug use: No    Family History  Problem Relation Age of Onset  . Hypertension Mother   . Cancer Mother        lung  . Cancer Father        prostate  . Hyperlipidemia Father   . Heart attack Father 47  . Cancer Brother        prostate  . Cirrhosis Sister   . Hepatitis Sister   . Breast cancer Paternal Aunt     Review of Systems: A 12-system review of systems was performed and was negative except as noted in the HPI.  --------------------------------------------------------------------------------------------------  Physical Exam: BP 110/68 (BP Location: Left Arm, Patient Position: Sitting, Cuff Size: Normal)   Pulse 92   Ht 5\' 3"  (1.6 m)   Wt 174 lb 8 oz (79.2 kg)   SpO2 97%   BMI 30.91 kg/m   General:  NAD HEENT: No conjunctival pallor or scleral icterus. Facemask in place. Neck: Supple without lymphadenopathy, thyromegaly, JVD, or HJR. Lungs: Normal work of breathing. Clear to auscultation bilaterally without wheezes or crackles. Heart: Regular rate and rhythm without murmurs, rubs, or gallops. Non-displaced PMI. Abd: Bowel sounds present. Soft, NT/ND without hepatosplenomegaly Ext: No lower extremity edema. Radial, PT, and DP pulses are 2+ bilaterally. Skin: Warm and dry without rash.  EKG:  NSR with  left axis deviation and mild QT prolongation (QTc 484).  Lab Results  Component Value Date   WBC 11.6 (H) 11/28/2017   HGB 15.2 11/28/2017   HCT 45.6 11/28/2017   MCV 86.8 11/28/2017   PLT 250 11/28/2017    Lab Results  Component Value Date   NA 132 (L) 01/28/2019   K 3.6 01/28/2019   CL 93 (L) 01/28/2019   CO2 27 01/28/2019   BUN 19 01/28/2019   CREATININE 1.10 (H) 01/28/2019   GLUCOSE 88 01/28/2019   ALT 19 09/24/2016    Outside lipid panel (04/25/2019): Total cholesterol 138, triglycerides 140, HDL 48, and LDL 62  --------------------------------------------------------------------------------------------------  ASSESSMENT AND PLAN: Coronary artery disease with stable angina: Ms. Bench continues to have brief chest pain and shortness of breath when rushing or stressed.  It does not affect her daily activities.  CTA reviewed together with the patient, which showed significant distal RCA/PL disease.  We discussed cardiac cath/PCI versus further medical management and have to the latter.  As she did not tolerate  isosorbide mononitrate well nor escalation of atenolol, we will add amlodipine 2.5 mg daily.  If symptoms worsen, we will need to readdress catheterization.  We will continue simvastatin and aspirin.  Hypertension: BP controlled.  Given addition of amlodipine, we will decrease lisinopril-HCTZ to 20-12.5 mg daily (from BID).  Hyperlipidemia: LDL at goal.  Continue simvastatin.  QT prolongation: Mildly prolonged QTc noted today.  Normal potassium on last check with PCP in July.  QT-prolonging medications should be avoided.  Tobacco use: Smoking cessation encouraged.  Ms. Kamen is trying to cut down/stop.  Follow-up: Return to clinic in 6 weeks.  [name], MD 07/08/2019 4:42 PM

## 2019-07-09 ENCOUNTER — Ambulatory Visit: Payer: 59 | Admitting: Internal Medicine

## 2019-07-09 ENCOUNTER — Encounter: Payer: Self-pay | Admitting: Internal Medicine

## 2019-07-09 VITALS — BP 114/78 | HR 94 | Temp 97.5°F | Ht 63.0 in | Wt 177.2 lb

## 2019-07-09 DIAGNOSIS — G4733 Obstructive sleep apnea (adult) (pediatric): Secondary | ICD-10-CM

## 2019-07-09 DIAGNOSIS — R918 Other nonspecific abnormal finding of lung field: Secondary | ICD-10-CM | POA: Diagnosis not present

## 2019-07-09 DIAGNOSIS — J449 Chronic obstructive pulmonary disease, unspecified: Secondary | ICD-10-CM

## 2019-07-09 NOTE — Progress Notes (Signed)
PULMONARY OFFICE FOLLOW UP NOTE  PROBLEMS:  Mild COPD Smoker  DATA: LDCT 06/07/16: normal PFTs 06/13/16: mild obstruction with borderline significant improvement after bronchodilator. Normal TLC. Mildly reduced DLCO  INTERVAL HISTORY: Previously seen by VM and diagnosed with mild COPD.    CC  Follow-up COPD Follow-up OSA      HPI Patient with a diagnosis of sleep apnea CPAP 8 cm of water pressure Compliance report shows 100% for days and greater than 4 hours She uses and benefits from CPAP therapy AHI is down to 0.2  Patient still currently smoking Smoking cessation strongly advised   No  COPD exacerbation at this time No evidence of heart failure at this time No evidence or signs of infection at this time No respiratory distress No fevers, chills, nausea, vomiting, diarrhea No evidence of lower extremity edema No evidence hemoptysis   He wears full facemask Bell's palsy resolved  CT chest reviewed 1 year ago Scattered undetermined bilateral nodules Follow-up CT chest in 6 months needed    Smoking Assessment and Cessation Counseling   Upon further questioning, Patient smokes 1/2 pack/day  I have advised patient to quit/stop smoking as soon as possible due to high risk for multiple medical problems  Patient  is NOT willing to quit smoking  I have advised patient that we can assist and have options of Nicotine replacement therapy. I also advised patient on behavioral therapy and can provide oral medication therapy in conjunction with the other therapies  Follow up next Office visit  for assessment of smoking cessation  Smoking cessation counseling advised for 4 minutes             Review of Systems:  Gen:  Denies  fever, sweats, chills weight loss  HEENT: Denies blurred vision, double vision, ear pain, eye pain, hearing loss, nose bleeds, sore throat Cardiac:  No dizziness, chest pain or heaviness, chest tightness,edema, No JVD Resp:   No  cough, -sputum production, +shortness of breath,-wheezing, -hemoptysis,  Gi: Denies swallowing difficulty, stomach pain, nausea or vomiting, diarrhea, constipation, bowel incontinence Gu:  Denies bladder incontinence, burning urine Ext:   Denies Joint pain, stiffness or swelling Skin: Denies  skin rash, easy bruising or bleeding or hives Endoc:  Denies polyuria, polydipsia , polyphagia or weight change Psych:   Denies depression, insomnia or hallucinations  Other:  All other systems negative   BP 114/78 (BP Location: Left Arm, Cuff Size: Normal)   Pulse 94   Temp (!) 97.5 F (36.4 C) (Temporal)   Ht 5\' 3"  (1.6 m)   Wt 177 lb 3.2 oz (80.4 kg)   SpO2 97%   BMI 31.39 kg/m    Physical Examination:   GENERAL:NAD, no fevers, chills, no weakness no fatigue HEAD: Normocephalic, atraumatic.  EYES: PERLA, EOMI No scleral icterus.  NECK: Supple. No thyromegaly.  No JVD.  PULMONARY: CTA B/L no wheezing, rhonchi, crackles CARDIOVASCULAR: S1 and S2. Regular rate and rhythm. No murmurs GASTROINTESTINAL: Soft, nontender, nondistended. Positive bowel sounds.  MUSCULOSKELETAL: No swelling, clubbing, or edema.  NEUROLOGIC: No gross focal neurological deficits. 5/5 strength all extremities SKIN: No ulceration, lesions, rashes, or cyanosis.  PSYCHIATRIC: Insight, judgment intact. -depression -anxiety ALL OTHER ROS ARE NEGATIVE             DATA:   CXR 09/24/16: vague left upper lobe opacity   CXR 11/10/16 : resolution of LUL opacity CT chest 07/16/2018-and discrete bilateral pulmonary nodules not visualized but reported Mild lymphadenopathy anterior tracheal space  IMPRESSION:   #1 COPD mild Gold stage A Continue inhalers as prescribed with Symbicort and Spiriva Albuterol as needed No evidence of COPD exacerbation at this time Obtain pulmonary function tests for interval assessment of lung function  #2 OSA Patient has excellent compliance report and is benefiting from  auto CPAP therapy Patient did have a recent diagnosis of Bell's palsy   #3  Smoking cessation strongly advised    #4 CT chest with discrete pulmonary nodules with some anterior tracheal adenopathy Follow-up CT chest in 6 months  Obesity -recommend significant weight loss -recommend changing diet  Deconditioned state -Recommend increased daily activity and exercise     COVID-19 EDUCATION: The signs and symptoms of COVID-19 were discussed with the patient and how to seek care for testing.  The importance of social distancing was discussed today. Hand Washing Techniques and avoid touching face was advised.     MEDICATION ADJUSTMENTS/LABS AND TESTS ORDERED: Continue CPAP as prescribed Stop smoking Continue inhalers as prescribed PFTs scheduled in 6 months CT chest scheduled in 6 months   CURRENT MEDICATIONS REVIEWED AT LENGTH WITH PATIENT TODAY   Patient satisfied with Plan of action and management. All questions answered  Follow up in 2 months   Diem Pagnotta Patricia Pesa, M.D.  [facility]  Medical Director [place] Director [facility] Cardio-Pulmonary Department

## 2019-07-09 NOTE — Patient Instructions (Addendum)
PLEASE STOP SMOKING!!!!!!!!!  Continue inhalers  Obtain PFT's  Continue CPAP as prescibed  CT chest in 6 months

## 2019-07-10 ENCOUNTER — Encounter: Payer: Self-pay | Admitting: Internal Medicine

## 2019-07-10 DIAGNOSIS — R9431 Abnormal electrocardiogram [ECG] [EKG]: Secondary | ICD-10-CM | POA: Insufficient documentation

## 2019-07-16 ENCOUNTER — Telehealth: Payer: Self-pay | Admitting: *Deleted

## 2019-07-16 ENCOUNTER — Encounter: Payer: Self-pay | Admitting: *Deleted

## 2019-07-16 DIAGNOSIS — Z87891 Personal history of nicotine dependence: Secondary | ICD-10-CM

## 2019-07-16 DIAGNOSIS — Z122 Encounter for screening for malignant neoplasm of respiratory organs: Secondary | ICD-10-CM

## 2019-07-16 NOTE — Telephone Encounter (Signed)
Patient has been notified that annual lung cancer screening low dose CT scan is due currently or will be in near future. Confirmed that patient is within the age range of 55-77, and asymptomatic, (no signs or symptoms of lung cancer). Patient denies illness that would prevent curative treatment for lung cancer if found. Verified smoking history, (current, 32.25 pack year). The shared decision making visit was done 06/07/16. Patient is agreeable for CT scan being scheduled.

## 2019-07-16 NOTE — Telephone Encounter (Signed)
Left message for patient to notify them that it is time to schedule annual low dose lung cancer screening CT scan. Instructed patient to call back to verify information prior to the scan being scheduled.  

## 2019-07-23 ENCOUNTER — Ambulatory Visit
Admission: RE | Admit: 2019-07-23 | Discharge: 2019-07-23 | Disposition: A | Discharge status: Home / Self Care | Payer: 59 | Source: Ambulatory Visit | Attending: Nurse Practitioner | Admitting: Nurse Practitioner

## 2019-07-23 ENCOUNTER — Other Ambulatory Visit: Payer: Self-pay

## 2019-07-23 DIAGNOSIS — F17210 Nicotine dependence, cigarettes, uncomplicated: Secondary | ICD-10-CM | POA: Diagnosis not present

## 2019-07-23 DIAGNOSIS — Z122 Encounter for screening for malignant neoplasm of respiratory organs: Secondary | ICD-10-CM | POA: Diagnosis not present

## 2019-07-23 DIAGNOSIS — Z87891 Personal history of nicotine dependence: Secondary | ICD-10-CM | POA: Diagnosis not present

## 2019-07-25 ENCOUNTER — Encounter: Payer: Self-pay | Admitting: *Deleted

## 2019-08-05 ENCOUNTER — Ambulatory Visit
Admission: RE | Admit: 2019-08-05 | Discharge: 2019-08-05 | Disposition: A | Discharge status: Home / Self Care | Payer: 59 | Source: Ambulatory Visit | Attending: Family Medicine | Admitting: Family Medicine

## 2019-08-05 DIAGNOSIS — Z1231 Encounter for screening mammogram for malignant neoplasm of breast: Secondary | ICD-10-CM | POA: Insufficient documentation

## 2019-08-20 ENCOUNTER — Ambulatory Visit (INDEPENDENT_AMBULATORY_CARE_PROVIDER_SITE_OTHER): Payer: 59 | Admitting: Nurse Practitioner

## 2019-08-20 ENCOUNTER — Other Ambulatory Visit: Payer: Self-pay

## 2019-08-20 ENCOUNTER — Encounter: Payer: Self-pay | Admitting: Nurse Practitioner

## 2019-08-20 VITALS — BP 122/68 | HR 93 | Temp 97.2°F | Ht 63.0 in | Wt 174.8 lb

## 2019-08-20 DIAGNOSIS — I5189 Other ill-defined heart diseases: Secondary | ICD-10-CM | POA: Insufficient documentation

## 2019-08-20 DIAGNOSIS — I2510 Atherosclerotic heart disease of native coronary artery without angina pectoris: Secondary | ICD-10-CM | POA: Insufficient documentation

## 2019-08-20 DIAGNOSIS — I739 Peripheral vascular disease, unspecified: Secondary | ICD-10-CM

## 2019-08-20 DIAGNOSIS — E782 Mixed hyperlipidemia: Secondary | ICD-10-CM

## 2019-08-20 DIAGNOSIS — Z720 Tobacco use: Secondary | ICD-10-CM

## 2019-08-20 DIAGNOSIS — I10 Essential (primary) hypertension: Secondary | ICD-10-CM | POA: Insufficient documentation

## 2019-08-20 DIAGNOSIS — R9431 Abnormal electrocardiogram [ECG] [EKG]: Secondary | ICD-10-CM

## 2019-08-20 DIAGNOSIS — E119 Type 2 diabetes mellitus without complications: Secondary | ICD-10-CM | POA: Insufficient documentation

## 2019-08-20 DIAGNOSIS — I25118 Atherosclerotic heart disease of native coronary artery with other forms of angina pectoris: Secondary | ICD-10-CM | POA: Insufficient documentation

## 2019-08-20 DIAGNOSIS — E785 Hyperlipidemia, unspecified: Secondary | ICD-10-CM | POA: Insufficient documentation

## 2019-08-20 NOTE — Progress Notes (Signed)
Office Visit    Patient Name: Monica Oliver Date of Encounter: 08/20/2019  Primary Care Provider:  Petra Kuba, MD Primary Cardiologist:  [name], MD  Chief Complaint    68 year old female with a history of nonobstructive CAD by coronary CTA in December 2019, stable angina, hypertension, hyperlipidemia, type 2 diabetes mellitus, ongoing tobacco abuse, COPD, sleep apnea, and shingles, who presents for follow-up of stable angina.  Past Medical History    Past Medical History:  Diagnosis Date  . Allergy    environmental  . Arthritis   . Asthma   . CAD (coronary artery disease)    a. 08/2018 Cardiac CTA: CAC score 306 (91st %'ile). LM nl, LAD nl, LCX small w/ <50% mid stenosis. RI nl, RCA <50% mid, 51-69% distal (CTFFR of rPL 0.52).  . Claudication ([place])   . COPD (chronic obstructive pulmonary disease) ([place])   . Diabetes mellitus without complication ([place])   . Diastolic dysfunction    a. 10/2018 Echo: EF 55-60%, Gr1 DD. Nl RV size/fxn.  Marland Kitchen GERD (gastroesophageal reflux disease)   . Hyperlipidemia   . Hypertension   . Sleep apnea    C-Pap machine  . Tobacco abuse    Past Surgical History:  Procedure Laterality Date  . CESAREAN SECTION     1986  . CHOLECYSTECTOMY     1993    Allergies  Allergies  Allergen Reactions  . Penicillins Other (See Comments)    Unsure- she's had it for so long    History of Present Illness    68 year old female with the above past medical history including nonobstructive CAD by coronary CTA in December 2019, stable angina, hypertension, hyperlipidemia, type 2 diabetes mellitus, ongoing tobacco abuse, COPD, sleep apnea, and shingles.  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  In that setting, she had previously noted exertional chest pain and dyspnea and thus underwent cardiac work-up with coronary CTA in December 2019 as outlined above.  She was previously treated with isosorbide mononitrate but  discontinued this earlier this year in the setting of achiness and generalized fatigue, with resolution of symptoms.  She was last seen in cardiology clinic on October 12 with complaints of intermittent left-sided chest discomfort that typically occur when she is "rushing" or stressed.  She was placed on amlodipine 2.5 mg daily and her lisinopril-HCTZ dose was reduced in order to make room for that.  Since her last visit, she has done well.  She has not had any episodes of chest discomfort but also notes that she tries to avoid "rushing" around.  She is active at her work without symptoms or limitations.  She denies dyspnea, palpitations, PND, orthopnea, dizziness, syncope, edema, or early satiety.  Of note, today she does report bilateral thigh claudication that has been occurring after walking about 25 yards and resolves after a few minutes of rest.  This typically impacts the anterior and medial thighs.  She is interested in noninvasive testing.  She continues to smoke about 8 cigarettes/day and notes that it has been very challenging to quit despite trying all of the available pharmaceutical aids.  Home Medications    Prior to Admission medications   Medication Sig Start Date End Date Taking? Authorizing Provider  albuterol (PROVENTIL HFA;VENTOLIN HFA) 108 (90 Base) MCG/ACT inhaler Inhale 1 puff into the lungs every 6 (six) hours as needed. 06/23/16  Yes Mungal, Vishal, MD  albuterol (PROVENTIL) (2.5 MG/3ML) 0.083% nebulizer solution Take 3  mLs (2.5 mg total) by nebulization every 6 (six) hours as needed for wheezing or shortness of breath. 10/23/17  Yes Kasa, Maretta Bees, MD  amLODipine (NORVASC) 2.5 MG tablet Take 1 tablet (2.5 mg total) by mouth daily. 07/08/19 10/06/19 Yes End, Harrell Gave, MD  aspirin 81 MG tablet Take 81 mg by mouth daily. 1 tablet daily   Yes [provider]  cetirizine (ZYRTEC) 10 MG tablet Take 10 mg by mouth daily as needed.   Yes [provider]  fluticasone  (FLONASE) 50 MCG/ACT nasal spray Place 1 spray into both nostrils daily. As needed 03/03/16  Yes Fisher, Linden Dolin, PA-C  gabapentin (NEURONTIN) 100 MG capsule Take 100 mg by mouth as needed.    Yes [provider]  lisinopril-hydrochlorothiazide (ZESTORETIC) 20-12.5 MG tablet Take 1 tablet by mouth daily. 07/08/19  Yes End, Harrell Gave, MD  metFORMIN (GLUCOPHAGE) 500 MG tablet Take 1,000 mg by mouth 2 (two) times daily with a meal. Extended release   Yes [provider]  omeprazole (PRILOSEC) 20 MG capsule Take 20 mg by mouth as needed.    Yes [provider]  simvastatin (ZOCOR) 20 MG tablet Take 20 mg by mouth daily.  08/28/18  Yes [provider]  SPIRIVA RESPIMAT 1.25 MCG/ACT AERS INHALE 2 PUFFS INTO THE LUNGS DAILY. 11/06/18  Yes Flora Lipps, MD  SYMBICORT 160-4.5 MCG/ACT inhaler INHALE 2 PUFFS INTO THE LUNGS TWO TIMES DAILY 04/17/19  Yes Flora Lipps, MD  TRUE METRIX BLOOD GLUCOSE TEST test strip  02/19/16  Yes [provider]  atenolol (TENORMIN) 25 MG tablet Take 1 tablet (25 mg total) by mouth daily. 09/18/18 07/08/19  End, Harrell Gave, MD    Review of Systems    She has been doing well without any recurrent chest pain since starting amlodipine.  She denies dyspnea, palpitations, PND, orthopnea, dizziness, syncope, edema, or early satiety.  She has had anterior and medial thigh claudication after walking about 25 yards.  All other systems reviewed and are otherwise negative except as noted above.  Physical Exam    VS:  BP 122/68 (BP Location: Left Arm, Patient Position: Sitting, Cuff Size: Normal)   Pulse 93   Temp (!) 97.2 F (36.2 C)   Ht 5\' 3"  (1.6 m)   Wt 174 lb 12 oz (79.3 kg)   SpO2 98%   BMI 30.96 kg/m  , BMI Body mass index is 30.96 kg/m. GEN: Well nourished, well developed, in no acute distress. HEENT: normal. Neck: Supple, no JVD, carotid bruits, or masses. Cardiac: RRR, no murmurs, rubs, or gallops. No clubbing, cyanosis,  edema.  Radials/DP/PT 2+ and equal bilaterally.  Respiratory:  Respirations regular and unlabored, diminished breath sounds bilaterally. GI: Soft, nontender, nondistended, BS + x 4. MS: no deformity or atrophy. Skin: warm and dry, no rash. Neuro:  Strength and sensation are intact. Psych: Normal affect.  Accessory Clinical Findings    ECG personally reviewed by me today -regular sinus rhythm, 93, prolonged QT/QTc (396/492)- no acute changes.  Lab Results  Component Value Date   WBC 11.6 (H) 11/28/2017   HGB 15.2 11/28/2017   HCT 45.6 11/28/2017   MCV 86.8 11/28/2017   PLT 250 11/28/2017   Lab Results  Component Value Date   CREATININE 1.10 (H) 01/28/2019   BUN 19 01/28/2019   NA 132 (L) 01/28/2019   K 3.6 01/28/2019   CL 93 (L) 01/28/2019   CO2 27 01/28/2019   Lab Results  Component Value Date   ALT  19 09/24/2016   AST 33 09/24/2016   ALKPHOS 72 09/24/2016   BILITOT 0.4 09/24/2016    Assessment & Plan    1.  Nonobstructive coronary artery disease/stable angina: Patient with known, mild circumflex and distal RCA disease with normal CT FFR in December 2019.  She was placed on amlodipine therapy last month in the setting of ongoing stable angina and she has noticed an improvement in symptoms.  She has not had any chest discomfort over the past month and has not noted any significant limitations in her work activities.  She remains on aspirin, amlodipine, beta-blocker, ACE inhibitor, and statin therapy.    2.  Essential hypertension: Stable on beta-blocker, calcium channel blocker, ACE inhibitor, and HCTZ.  3.  Hyperlipidemia: She remains on simvastatin therapy.  She has not had fasting lipids in some time.  Perhaps we can arrange for this at her next visit if not checked by primary care recently (we do not have access to those records).  4.  Type 2 diabetes mellitus: Followed by primary care and she is treated with Metformin.  Also on ACE inhibitor and statin.  5.  Ongoing  tobacco abuse: Smoking about 8 cigarettes/day.  Complete cessation advised.  6.  Prolonged QT: QT of 396 with QTC of 492 on today's ECG.  This is slightly longer than what was seen on October 12.  We will need to continue to avoid QT prolonging medications.  7.  Bilateral thigh claudication: She says this is been going on for some time now.  Symptoms of anterior and medial thigh claudication/discomfort develop after walking about 25 yards.  She has strong distal pulses.  Regardless, with her smoking history and known nonobstructive CAD, I will arrange for ABIs with lower extremity arterial duplex.  8.  Disposition: Follow-up ABIs//lower extremity duplex.  Follow-up in clinic in 3 months or sooner if necessary.   Murray Hodgkins, NP 08/20/2019, 5:17 PM

## 2019-08-20 NOTE — Patient Instructions (Addendum)
Medication Instructions:  Your physician recommends that you continue on your current medications as directed. Please refer to the Current Medication list given to you today.  *If you need a refill on your cardiac medications before your next appointment, please call your pharmacy*  Lab Work: None ordered  If you have labs (blood work) drawn today and your tests are completely normal, you will receive your results only by: Marland Kitchen MyChart Message (if you have MyChart) OR . A paper copy in the mail If you have any lab test that is abnormal or we need to change your treatment, we will call you to review the results.  Testing/Procedures: 1- Your physician has requested that you have an ankle brachial index (ABI). During this test an ultrasound and blood pressure cuff are used to evaluate the arteries that supply the arms and legs with blood. Allow thirty minutes for this exam. There are no restrictions or special instructions.  2- Your physician has requested that you have a lower extremity arterial exercise duplex. During this test, exercise and ultrasound are used to evaluate arterial blood flow in the legs. Allow one hour for this exam. There are no restrictions or special instructions.    Follow-Up: At [facility], you and your health needs are our priority.  As part of our continuing mission to provide you with exceptional heart care, we have created designated Provider Care Teams.  These Care Teams include your primary Cardiologist (physician) and Advanced Practice Providers (APPs -  Physician Assistants and Nurse Practitioners) who all work together to provide you with the care you need, when you need it.  Your next appointment:   3 month(s)  The format for your next appointment:   In Person  Provider:    You may see [name], MD or Murray Hodgkins, NP.

## 2019-08-30 ENCOUNTER — Other Ambulatory Visit: Payer: Self-pay | Admitting: Internal Medicine

## 2019-09-04 DIAGNOSIS — Z23 Encounter for immunization: Secondary | ICD-10-CM | POA: Diagnosis not present

## 2019-09-04 DIAGNOSIS — J449 Chronic obstructive pulmonary disease, unspecified: Secondary | ICD-10-CM | POA: Diagnosis not present

## 2019-09-04 DIAGNOSIS — E559 Vitamin D deficiency, unspecified: Secondary | ICD-10-CM | POA: Diagnosis not present

## 2019-09-04 DIAGNOSIS — E538 Deficiency of other specified B group vitamins: Secondary | ICD-10-CM | POA: Diagnosis not present

## 2019-09-04 DIAGNOSIS — R300 Dysuria: Secondary | ICD-10-CM | POA: Diagnosis not present

## 2019-09-04 DIAGNOSIS — E119 Type 2 diabetes mellitus without complications: Secondary | ICD-10-CM | POA: Diagnosis not present

## 2019-09-04 DIAGNOSIS — I10 Essential (primary) hypertension: Secondary | ICD-10-CM | POA: Diagnosis not present

## 2019-09-04 DIAGNOSIS — G4730 Sleep apnea, unspecified: Secondary | ICD-10-CM | POA: Diagnosis not present

## 2019-09-04 DIAGNOSIS — F17210 Nicotine dependence, cigarettes, uncomplicated: Secondary | ICD-10-CM | POA: Diagnosis not present

## 2019-09-24 ENCOUNTER — Other Ambulatory Visit: Payer: Self-pay

## 2019-09-24 ENCOUNTER — Ambulatory Visit (INDEPENDENT_AMBULATORY_CARE_PROVIDER_SITE_OTHER): Payer: 59

## 2019-09-24 ENCOUNTER — Other Ambulatory Visit: Payer: Self-pay | Admitting: Internal Medicine

## 2019-09-24 DIAGNOSIS — I739 Peripheral vascular disease, unspecified: Secondary | ICD-10-CM

## 2019-09-25 ENCOUNTER — Telehealth: Payer: Self-pay

## 2019-09-25 NOTE — Telephone Encounter (Signed)
Call to patient to discuss results of ABI. No further orders or questions at this time.   Advised pt to call for any further questions or concerns.

## 2019-09-25 NOTE — Telephone Encounter (Signed)
-----   Message from Theora Gianotti, NP sent at 09/24/2019  7:05 PM EST ----- Normal ABIs w/o evidence for reduced blood flow to the legs. Great news.

## 2019-09-26 ENCOUNTER — Other Ambulatory Visit: Payer: Self-pay | Admitting: Internal Medicine

## 2019-09-26 NOTE — Telephone Encounter (Signed)
DK please advise on refill. Thanks

## 2019-10-29 DIAGNOSIS — G4733 Obstructive sleep apnea (adult) (pediatric): Secondary | ICD-10-CM | POA: Diagnosis not present

## 2019-12-16 ENCOUNTER — Other Ambulatory Visit: Payer: Self-pay | Admitting: Internal Medicine

## 2019-12-16 NOTE — Telephone Encounter (Signed)
Please schedule overdue 3 month F/U  with Dr. Saunders Revel or Ignacia Bayley, NP. Thank you!

## 2019-12-17 NOTE — Telephone Encounter (Signed)
Attempted to schedule.  LMOV to call office.  ° °

## 2019-12-17 NOTE — Telephone Encounter (Signed)
See below

## 2020-01-06 ENCOUNTER — Telehealth: Payer: Self-pay | Admitting: Internal Medicine

## 2020-01-06 ENCOUNTER — Ambulatory Visit
Admission: RE | Admit: 2020-01-06 | Discharge: 2020-01-06 | Disposition: A | Discharge status: Home / Self Care | Payer: 59 | Source: Ambulatory Visit | Attending: Internal Medicine | Admitting: Internal Medicine

## 2020-01-06 ENCOUNTER — Other Ambulatory Visit: Payer: Self-pay

## 2020-01-06 DIAGNOSIS — R918 Other nonspecific abnormal finding of lung field: Secondary | ICD-10-CM | POA: Insufficient documentation

## 2020-01-06 NOTE — Telephone Encounter (Signed)
Patient Consent for Virtual Visit    { PLEASE READ THE FOLLOWING TO THE PATIENT. Then, scroll to the question in red below. If the patient has questions about consent, refer to and read the consent below,    CONSENT FOR VIRTUAL VISIT.  Monica Oliver, you are scheduled for a virtual visit with your provider today.  Just as we do with appointments in the office, we must obtain your consent to participate.  Your consent will be active for this visit and any virtual visit you may have with one of our providers in the next 365 days.  If you have a MyChart account, I can also send a copy of this consent to you electronically.  All virtual visits are billed to your insurance company just like a traditional visit in the office.  As this is a virtual visit, video technology does not allow for your provider to perform a traditional examination.  This may limit your provider's ability to fully assess your condition.  If your provider identifies any concerns that need to be evaluated in person or the need to arrange testing such as labs, EKG, etc, we will make arrangements to do so.  Although advances in technology are sophisticated, we cannot ensure that it will always work on either your end or our end.  If the connection with a video visit is poor, we may have to switch to a telephone visit.  With either a video or telephone visit, we are not always able to ensure that we have a secure connection.   I need to obtain your verbal consent now.   Are you willing to proceed with your visit today?   YES  Did the patient verbally consent to the visit?  Place your cursor on the next line. Then, press F2 or Fn plus F2 to select the list below.  Then, choose YES or NO. YES    Monica Oliver has provided verbal consent on 01/06/2020 for a virtual visit (video or telephone).   CONSENT FOR VIRTUAL VISIT FOR:  Monica Oliver  By participating in this virtual visit I agree to the following:  I hereby voluntarily request,  consent and authorize [facility] and its employed or contracted physicians, physician assistants, nurse practitioners or other licensed health care professionals (the Practitioner), to provide me with telemedicine health care services (the "Services") as deemed necessary by the treating Practitioner. I acknowledge and consent to receive the Services by the Practitioner via telemedicine. I understand that the telemedicine visit will involve communicating with the Practitioner through live audiovisual communication technology and the disclosure of certain medical information by electronic transmission. I acknowledge that I have been given the opportunity to request an in-person assessment or other available alternative prior to the telemedicine visit and am voluntarily participating in the telemedicine visit.  I understand that I have the right to withhold or withdraw my consent to the use of telemedicine in the course of my care at any time, without affecting my right to future care or treatment, and that the Practitioner or I may terminate the telemedicine visit at any time. I understand that I have the right to inspect all information obtained and/or recorded in the course of the telemedicine visit and may receive copies of available information for a reasonable fee.  I understand that some of the potential risks of receiving the Services via telemedicine include:  Marland Kitchen Delay or interruption in medical evaluation due to technological equipment failure or disruption; . Information  transmitted may not be sufficient (e.g. poor resolution of images) to allow for appropriate medical decision making by the Practitioner; and/or  . In rare instances, security protocols could fail, causing a breach of personal health information.  Furthermore, I acknowledge that it is my responsibility to provide information about my medical history, conditions and care that is complete and accurate to the best of my ability. I  acknowledge that Practitioner's advice, recommendations, and/or decision may be based on factors not within their control, such as incomplete or inaccurate data provided by me or distortions of diagnostic images or specimens that may result from electronic transmissions. I understand that the practice of medicine is not an exact science and that Practitioner makes no warranties or guarantees regarding treatment outcomes. I acknowledge that a copy of this consent can be made available to me via my patient portal ([facility]), or I can request a printed copy by calling the office of [facility].    I understand that my insurance will be billed for this visit.   I have read or had this consent read to me. . I understand the contents of this consent, which adequately explains the benefits and risks of the Services being provided via telemedicine.  . I have been provided ample opportunity to ask questions regarding this consent and the Services and have had my questions answered to my satisfaction. . I give my informed consent for the services to be provided through the use of telemedicine in my medical care

## 2020-01-06 NOTE — Telephone Encounter (Signed)
Dr. Mortimer Fries,  Please see patient's call into the office about getting her CT scan results that were done today since the first available appt. Was in May. Please advise on results when they are available so that we can contact the patient.

## 2020-01-13 ENCOUNTER — Other Ambulatory Visit: Payer: Self-pay | Admitting: Internal Medicine

## 2020-01-16 ENCOUNTER — Telehealth (INDEPENDENT_AMBULATORY_CARE_PROVIDER_SITE_OTHER): Payer: 59 | Admitting: Internal Medicine

## 2020-01-16 ENCOUNTER — Telehealth: Payer: Self-pay

## 2020-01-16 ENCOUNTER — Encounter: Payer: Self-pay | Admitting: Internal Medicine

## 2020-01-16 ENCOUNTER — Other Ambulatory Visit: Payer: Self-pay

## 2020-01-16 ENCOUNTER — Telehealth: Payer: Self-pay | Admitting: *Deleted

## 2020-01-16 VITALS — BP 126/63 | HR 76 | Ht 63.0 in | Wt 172.0 lb

## 2020-01-16 DIAGNOSIS — E785 Hyperlipidemia, unspecified: Secondary | ICD-10-CM | POA: Diagnosis not present

## 2020-01-16 DIAGNOSIS — J449 Chronic obstructive pulmonary disease, unspecified: Secondary | ICD-10-CM | POA: Diagnosis not present

## 2020-01-16 DIAGNOSIS — I10 Essential (primary) hypertension: Secondary | ICD-10-CM | POA: Diagnosis not present

## 2020-01-16 DIAGNOSIS — I25118 Atherosclerotic heart disease of native coronary artery with other forms of angina pectoris: Secondary | ICD-10-CM

## 2020-01-16 MED ORDER — AMLODIPINE BESYLATE 2.5 MG PO TABS: 2.5000 mg | ORAL_TABLET | Freq: Every day | ORAL | 1 refills | Status: DC

## 2020-01-16 MED ORDER — NITROGLYCERIN 0.4 MG SL SUBL: 0.4000 mg | SUBLINGUAL_TABLET | SUBLINGUAL | 2 refills | Status: DC | PRN

## 2020-01-16 NOTE — Patient Instructions (Signed)
Medication Instructions:  Your physician has recommended you make the following change in your medication:  1- HOLD your Simvastatin for 1 month. Contact us in 1 month to let us know if muscle pains have resolved.  2- Nitroglycerin as needed for chest pain - Dissolve 1 tablet (0.4 mg) under your tongue every 5 minutes as needed for chest pain. Do not take more than 3 doses. If chest pain does not resolve, then call 911 or go to the Emergency Room.    *If you need a refill on your cardiac medications before your next appointment, please call your pharmacy*   Follow-Up: At [facility], you and your health needs are our priority.  As part of our continuing mission to provide you with exceptional heart care, we have created designated Provider Care Teams.  These Care Teams include your primary Cardiologist (physician) and Advanced Practice Providers (APPs -  Physician Assistants and Nurse Practitioners) who all work together to provide you with the care you need, when you need it.  We recommend signing up for the patient portal called "MyChart".  Sign up information is provided on this After Visit Summary.  MyChart is used to connect with patients for Virtual Visits (Telemedicine).  Patients are able to view lab/test results, encounter notes, upcoming appointments, etc.  Non-urgent messages can be sent to your provider as well.   To learn more about what you can do with MyChart, go to [email or web address].    Your next appointment:   3 month(s)  The format for your next appointment:   In Person  Provider:    You may see [name], MD or one of the following Advanced Practice Providers on your designated Care Team:    Murray Hodgkins, NP  Christell Faith, PA-C  Marrianne Mood, PA-C

## 2020-01-16 NOTE — Progress Notes (Signed)
Virtual Visit via Video Note   This visit type was conducted due to national recommendations for restrictions regarding the COVID-19 Pandemic (e.g. social distancing) in an effort to limit this patient's exposure and mitigate transmission in our community.  Due to her co-morbid illnesses, this patient is at least at moderate risk for complications without adequate follow up.  This format is felt to be most appropriate for this patient at this time.  All issues noted in this document were discussed and addressed.  A limited physical exam was performed with this format.  Please refer to the patient's chart for her consent to telehealth for [facility].   The patient was identified using 2 identifiers.  Date:  01/16/2020   ID:  Monica Oliver, DOB November 24, 1950, MRN [id number]  Patient Location: Home Provider Location: Home  PCP:  Petra Kuba, MD (Inactive)  Cardiologist:  [name], MD  Electrophysiologist:  None   Evaluation Performed:  Follow-Up Visit  Chief Complaint:  Follow-up coronary artery disease  History of Present Illness:    Monica Oliver is a 69 y.o. female with history of  coronary artery disease with stable angina, HTN, HLD, DM2, COPD, OSA, shingles, and tobacco use.  We are speaking today for follow-up of her CAD and leg pain.  She was last seen in our office by Ignacia Bayley, NP, in 07/2019.  At that time she complained of bilateral thigh pain when walking ~25 yards.  Subsequent ABI's were normal.  Today, Monica Oliver reports that her thigh pain has resolved.  She also feels like her exertional chest discomfort has been minimal since addition of amlodipine.  She complains of soreness in both shoulders; on the right, it has been tender to palpitation.  She denies trauma or changes to her usual activities.  Her exertional dyspnea has gradually worsened over months to years, which she attributes to COPD.  She continues to smoke and has not tried Chantix again, as she  states that she has a lot of stress in her life right now.  She denies palpitations, lightheadedness, and edema.  Recent lung cancer screen CT again showed coronary artery calcification, which Monica Oliver was somewhat concerned about.  Past Medical History:  Diagnosis Date  . Allergy    environmental  . Arthritis   . Asthma   . CAD (coronary artery disease)    a. 08/2018 Cardiac CTA: CAC score 306 (91st %'ile). LM nl, LAD nl, LCX small w/ <50% mid stenosis. RI nl, RCA <50% mid, 51-69% distal (CTFFR of rPL 0.52).  . Claudication ([place])   . COPD (chronic obstructive pulmonary disease) ([place])   . Diabetes mellitus without complication ([place])   . Diastolic dysfunction    a. 10/2018 Echo: EF 55-60%, Gr1 DD. Nl RV size/fxn.  Marland Kitchen GERD (gastroesophageal reflux disease)   . Hyperlipidemia   . Hypertension   . Sleep apnea    C-Pap machine  . Tobacco abuse    Past Surgical History:  Procedure Laterality Date  . CESAREAN SECTION     1986  . CHOLECYSTECTOMY     1993     Current Meds  Medication Sig  . albuterol (PROVENTIL) (2.5 MG/3ML) 0.083% nebulizer solution TAKE 3 MLS (2.5 MG TOTAL) BY NEBULIZATION EVERY 6 (SIX) HOURS AS NEEDED FOR WHEEZING OR SHORTNESS OF BREATH.  Marland Kitchen albuterol (VENTOLIN HFA) 108 (90 Base) MCG/ACT inhaler USE 2 PUFFS EVERY 4 HOURS AS NEEDED  . amLODipine (NORVASC) 2.5 MG tablet Take 1 tablet (2.5  mg total) by mouth daily.  Marland Kitchen aspirin 81 MG tablet Take 81 mg by mouth daily. 1 tablet daily  . atenolol (TENORMIN) 25 MG tablet Take 1 tablet (25 mg total) by mouth daily.  . cetirizine (ZYRTEC) 10 MG tablet Take 10 mg by mouth daily as needed.  . fluticasone (FLONASE) 50 MCG/ACT nasal spray Place 1 spray into both nostrils daily. As needed  . gabapentin (NEURONTIN) 100 MG capsule Take 100 mg by mouth as needed.   Marland Kitchen lisinopril-hydrochlorothiazide (ZESTORETIC) 20-12.5 MG tablet Take 1 tablet by mouth daily.  . metFORMIN (GLUCOPHAGE) 500 MG tablet Take 1,000 mg by mouth 2 (two) times  daily with a meal. Extended release  . omeprazole (PRILOSEC) 20 MG capsule Take 20 mg by mouth as needed.   . simvastatin (ZOCOR) 20 MG tablet Take 20 mg by mouth daily.   Marland Kitchen SPIRIVA RESPIMAT 1.25 MCG/ACT AERS INHALE 2 PUFFS INTO THE LUNGS DAILY.  . SYMBICORT 160-4.5 MCG/ACT inhaler INHALE 2 PUFFS INTO THE LUNGS TWO TIMES DAILY  . TRUE METRIX BLOOD GLUCOSE TEST test strip   . [DISCONTINUED] amLODipine (NORVASC) 2.5 MG tablet Take 1 tablet (2.5 mg total) by mouth daily. Please call to schedule office visit for further refills. Thank you!     Allergies:   Penicillins   Social History   Tobacco Use  . Smoking status: Current Every Day Smoker    Packs/day: 0.50    Years: 40.00    Pack years: 20.00    Types: Cigarettes    Start date: 12/15/1971  . Smokeless tobacco: Never Used  . Tobacco comment: feb.2017- tried Chantix- made her nauseous. Has the nicotine patch- not started  Substance Use Topics  . Alcohol use: No    Alcohol/week: 0.0 standard drinks  . Drug use: No     Family Hx: The patient's family history includes Breast cancer in her paternal aunt; Cancer in her brother, father, and mother; Cirrhosis in her sister; Heart attack (age of onset: 39) in her father; Hepatitis in her sister; Hyperlipidemia in her father; Hypertension in her mother.  ROS:   Please see the history of present illness.   All other systems reviewed and are negative.   Prior CV studies:   The following studies were reviewed today:  ABI's (09/24/2019): Normal  Labs/Other Tests and Data Reviewed:    EKG:  No ECG reviewed.  Recent Labs: 01/28/2019: BUN 19; Creatinine, Ser 1.10; Potassium 3.6; Sodium 132   Recent Lipid Panel No results found for: CHOL, TRIG, HDL, CHOLHDL, LDLCALC, LDLDIRECT  Wt Readings from Last 3 Encounters:  01/16/20 172 lb (78 kg)  08/20/19 174 lb 12 oz (79.3 kg)  07/23/19 174 lb (78.9 kg)     Objective:    Vital Signs:  BP 126/63 (BP Location: Left Arm, Patient Position:  Sitting)   Pulse 76   Ht 5\' 3"  (1.6 m)   Wt 172 lb (78 kg)   SpO2 98%   BMI 30.47 kg/m    VITAL SIGNS:  reviewed GEN:  no acute distress  ASSESSMENT & PLAN:    Coronary artery disease with stable angina: Chest pain is well controlled with current antianginal regimen of amlodipine and atenolol.  Exertional dyspnea is likely multifactorial.  We will continue with medical therapy.  We discussed that the finding of coronary artery calcification on recent lung cancer screen CT is not new and is consistent with prior results of cardiac CTA.  I will provide Monica Oliver with a prescription for  sublingual NTG to be used on an as-needed basis.  COPD and dyspnea on exertion: Continue current medication.  Smoking cessation encouraged.  Hyperlipidemia and myalgias: I wonder if migratory muscle pains (previously in the thighs, now affecting the shoulders) could be due to statin therapy.  I have recommended holding simvastatin x 1 month.  Monica Oliver should contact us at that time to let us know if her myalgias have resolved.  If so, we will transition to a different statin to target an LDL < 70.  Hyperlipidemia: Statin holiday is planned, as outlined above.  We will need to continue targeting an LDL < 70, given medically-managed CAD with stable angina.  Hypertension: Blood pressure well-controlled.  No medication changes today.  Time:   Today, I have spent 17 minutes with the patient with telehealth technology discussing the above problems.     Medication Adjustments/Labs and Tests Ordered: Current medicines are reviewed at length with the patient today.  Concerns regarding medicines are outlined above.   Tests Ordered: None.  Medication Changes: Meds ordered this encounter  Medications  . amLODipine (NORVASC) 2.5 MG tablet    Sig: Take 1 tablet (2.5 mg total) by mouth daily.    Dispense:  90 tablet    Refill:  1  . nitroGLYCERIN (NITROSTAT) 0.4 MG SL tablet    Sig: Place 1 tablet (0.4 mg  total) under the tongue every 5 (five) minutes as needed for chest pain. Maximum of 3 doses.    Dispense:  25 tablet    Refill:  2    Follow Up:  In Person in 3 month(s)  Signed, [name], MD  01/16/2020 2:13 PM    [facility]

## 2020-01-16 NOTE — Telephone Encounter (Signed)
  Patient Consent for Virtual Visit         Monica Oliver has provided verbal consent on 01/16/2020 for a virtual visit (video or telephone).   CONSENT FOR VIRTUAL VISIT FOR:  Monica Oliver  By participating in this virtual visit I agree to the following:  I hereby voluntarily request, consent and authorize [facility] and its employed or contracted physicians, physician assistants, nurse practitioners or other licensed health care professionals (the Practitioner), to provide me with telemedicine health care services (the "Services") as deemed necessary by the treating Practitioner. I acknowledge and consent to receive the Services by the Practitioner via telemedicine. I understand that the telemedicine visit will involve communicating with the Practitioner through live audiovisual communication technology and the disclosure of certain medical information by electronic transmission. I acknowledge that I have been given the opportunity to request an in-person assessment or other available alternative prior to the telemedicine visit and am voluntarily participating in the telemedicine visit.  I understand that I have the right to withhold or withdraw my consent to the use of telemedicine in the course of my care at any time, without affecting my right to future care or treatment, and that the Practitioner or I may terminate the telemedicine visit at any time. I understand that I have the right to inspect all information obtained and/or recorded in the course of the telemedicine visit and may receive copies of available information for a reasonable fee.  I understand that some of the potential risks of receiving the Services via telemedicine include:  Marland Kitchen Delay or interruption in medical evaluation due to technological equipment failure or disruption; . Information transmitted may not be sufficient (e.g. poor resolution of images) to allow for appropriate medical decision making by the Practitioner;  and/or  . In rare instances, security protocols could fail, causing a breach of personal health information.  Furthermore, I acknowledge that it is my responsibility to provide information about my medical history, conditions and care that is complete and accurate to the best of my ability. I acknowledge that Practitioner's advice, recommendations, and/or decision may be based on factors not within their control, such as incomplete or inaccurate data provided by me or distortions of diagnostic images or specimens that may result from electronic transmissions. I understand that the practice of medicine is not an exact science and that Practitioner makes no warranties or guarantees regarding treatment outcomes. I acknowledge that a copy of this consent can be made available to me via my patient portal ([place]), or I can request a printed copy by calling the office of [facility].    I understand that my insurance will be billed for this visit.   I have read or had this consent read to me. . I understand the contents of this consent, which adequately explains the benefits and risks of the Services being provided via telemedicine.  . I have been provided ample opportunity to ask questions regarding this consent and the Services and have had my questions answered to my satisfaction. . I give my informed consent for the services to be provided through the use of telemedicine in my medical care

## 2020-01-16 NOTE — Telephone Encounter (Signed)
No answer. Left message for patient to call back to go over AVS from Virtual visit today and schedule f/u appointment.

## 2020-01-17 NOTE — Telephone Encounter (Signed)
Patient has already had 3 month follow up scheduled. AVS mailed to patient.

## 2020-02-03 ENCOUNTER — Other Ambulatory Visit: Payer: Self-pay | Admitting: Internal Medicine

## 2020-02-12 ENCOUNTER — Encounter: Payer: Self-pay | Admitting: Acute Care

## 2020-02-12 ENCOUNTER — Other Ambulatory Visit: Payer: Self-pay | Admitting: Acute Care

## 2020-02-12 ENCOUNTER — Other Ambulatory Visit
Admission: RE | Admit: 2020-02-12 | Discharge: 2020-02-12 | Disposition: A | Discharge status: Home / Self Care | Payer: 59 | Source: Ambulatory Visit | Attending: Acute Care | Admitting: Acute Care

## 2020-02-12 ENCOUNTER — Ambulatory Visit: Payer: 59 | Admitting: Acute Care

## 2020-02-12 ENCOUNTER — Other Ambulatory Visit: Payer: Self-pay

## 2020-02-12 VITALS — BP 130/80 | HR 85 | Temp 97.1°F | Ht 63.0 in | Wt 171.4 lb

## 2020-02-12 DIAGNOSIS — J301 Allergic rhinitis due to pollen: Secondary | ICD-10-CM

## 2020-02-12 DIAGNOSIS — E119 Type 2 diabetes mellitus without complications: Secondary | ICD-10-CM | POA: Insufficient documentation

## 2020-02-12 DIAGNOSIS — Z720 Tobacco use: Secondary | ICD-10-CM

## 2020-02-12 DIAGNOSIS — Z9989 Dependence on other enabling machines and devices: Secondary | ICD-10-CM

## 2020-02-12 DIAGNOSIS — G4733 Obstructive sleep apnea (adult) (pediatric): Secondary | ICD-10-CM | POA: Diagnosis not present

## 2020-02-12 DIAGNOSIS — J449 Chronic obstructive pulmonary disease, unspecified: Secondary | ICD-10-CM

## 2020-02-12 DIAGNOSIS — R918 Other nonspecific abnormal finding of lung field: Secondary | ICD-10-CM | POA: Diagnosis not present

## 2020-02-12 DIAGNOSIS — J018 Other acute sinusitis: Secondary | ICD-10-CM | POA: Diagnosis not present

## 2020-02-12 DIAGNOSIS — Z122 Encounter for screening for malignant neoplasm of respiratory organs: Secondary | ICD-10-CM

## 2020-02-12 LAB — HEMOGLOBIN A1C
Hgb A1c MFr Bld: 5.9 % — ABNORMAL HIGH (ref 4.8–5.6)
Mean Plasma Glucose: 123 mg/dL

## 2020-02-12 MED ORDER — BUDESONIDE-FORMOTEROL FUMARATE 160-4.5 MCG/ACT IN AERO: INHALATION_SPRAY | RESPIRATORY_TRACT | 5 refills | Status: DC

## 2020-02-12 MED ORDER — FLUTICASONE PROPIONATE 50 MCG/ACT NA SUSP: 1.0000 | Freq: Every day | NASAL | 12 refills | Status: DC

## 2020-02-12 MED ORDER — SPIRIVA RESPIMAT 1.25 MCG/ACT IN AERS: INHALATION_SPRAY | RESPIRATORY_TRACT | 5 refills | Status: DC

## 2020-02-12 MED ORDER — ALBUTEROL SULFATE HFA 108 (90 BASE) MCG/ACT IN AERS: INHALATION_SPRAY | RESPIRATORY_TRACT | 12 refills | Status: DC

## 2020-02-12 NOTE — Progress Notes (Signed)
History of Present Illness Monica Oliver is a 69 y.o. female current every day smoker ( 20 pack year smoking history)  with mild COPD and OSA . She is followed by Dr. Mortimer Fries  02/12/2020 Follow up CT Chest: Pt. Presents for follow up of CT chest done 01/06/2020. We reviewed her results. She is also followed through the [facility]. Last scan through the annual screening program was done in 06/2020 and was read as a Lung RADS 2: nodules that are benign in appearance and behavior with a very low likelihood of becoming a clinically active cancer due to size or lack of growth. Recommendation per radiology is for a repeat LDCT in 12 months.  She states she has been doing well regarding her COPD. She is using her Spiriva daily , but she states she is using it twice daily instead . She is also using her Rescue albuterol about once daily. She has not been using her Symbicort. We did a significant inhaler teaching, reviewing difference between maintenance and rescue inhalers.  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  She states she is compliant with her Zyrtec and Flonase. These are controlling her allergic rhinitis  She has excellent compliance with her CPAP. A slight leak, she has changed her mask. She has started on Metformin and she has lost 25 pounds.    Test Results:  LDCT 06/07/16: normal   CXR 09/24/16: vague left upper lobe opacity   CXR 11/10/16 : resolution of LUL opacity   CT chest 07/16/2018-and discrete bilateral pulmonary nodules not visualized but reported Mild lymphadenopathy anterior tracheal space  CT Chest 01/06/2020 Multiple small 2-3 mm pulmonary nodules are stable compared to prior examinations and considered benign (as indicated by prior lung cancer screening examination), likely areas of  peripheral mucoid impaction. No other larger more suspicious appearing pulmonary nodules or masses are noted. Review of prior studies indicates that the patient was enrolled in the lung cancer screening program, with the last scan obtained July 23, 2019. Unless there is reason to withdraw the patient from the program, the patient should remain in the lung cancer screening program and obtain yearly annual followups. Additional chest CTs are considered unnecessary, and simply result in unnecessary radiation exposure for the patient. Aortic atherosclerosis, in addition to 2 vessel coronary artery disease. Please note that although the presence of coronary artery calcium documents the presence of coronary artery disease, the severity of this disease and any potential stenosis cannot be assessed on this non-gated CT examination. Assessment for potential risk factor modification, dietary therapy or pharmacologic therapy may be warranted, if clinically indicated.   PFTs 06/13/16: mild obstruction with borderline significant improvement after bronchodilator. Normal TLC. Mildly reduced DLCO     CBC Latest Ref Rng & Units 11/28/2017 09/24/2016 03/15/2014  WBC 3.6 - 11.0 K/uL 11.6(H) 7.6 10.8  Hemoglobin 12.0 - 16.0 g/dL 15.2 12.6 14.5  Hematocrit 35.0 - 47.0 % 45.6 38.0 43.4  Platelets 150 - 440 K/uL 250 265 291    BMP Latest Ref Rng & Units 01/28/2019 09/04/2018 11/28/2017  Glucose 70 - 99 mg/dL 88 138(H) 91  BUN 8 - 23 mg/dL 19 12 17   Creatinine 0.44 - 1.00 mg/dL 1.10(H) 1.08(H) 1.15(H)  Sodium 135 - 145 mmol/L 132(L) 134(L) 134(L)  Potassium 3.5 -  5.1 mmol/L 3.6 3.8 3.5  Chloride 98 - 111 mmol/L 93(L) 95(L) 96(L)  CO2 22 - 32 mmol/L 27 29 26   Calcium 8.9 - 10.3 mg/dL 9.5 9.4 9.6    BNP No results found for: BNP  ProBNP No results found for: PROBNP  PFT    Component Value Date/Time   FEV1PRE 1.40 06/13/2016 1442   FEV1POST 1.53 06/13/2016 1442   FVCPRE 1.84 06/13/2016 1442     FVCPOST 2.00 06/13/2016 1442   TLC 4.48 06/13/2016 1442   DLCOUNC 15.11 06/13/2016 1442   PREFEV1FVCRT 76 06/13/2016 1442   PSTFEV1FVCRT 76 06/13/2016 1442    No results found.   Past medical hx Past Medical History:  Diagnosis Date  . Allergy    environmental  . Arthritis   . Asthma   . CAD (coronary artery disease)    a. 08/2018 Cardiac CTA: CAC score 306 (91st %'ile). LM nl, LAD nl, LCX small w/ <50% mid stenosis. RI nl, RCA <50% mid, 51-69% distal (CTFFR of rPL 0.52).  . Claudication ([place])   . COPD (chronic obstructive pulmonary disease) ([place])   . Diabetes mellitus without complication ([place])   . Diastolic dysfunction    a. 10/2018 Echo: EF 55-60%, Gr1 DD. Nl RV size/fxn.  Marland Kitchen GERD (gastroesophageal reflux disease)   . Hyperlipidemia   . Hypertension   . Sleep apnea    C-Pap machine  . Tobacco abuse      Social History   Tobacco Use  . Smoking status: Current Every Day Smoker    Packs/day: 0.50    Years: 40.00    Pack years: 20.00    Types: Cigarettes    Start date: 12/15/1971  . Smokeless tobacco: Never Used  . Tobacco comment: feb.2017- tried Chantix- made her nauseous. Has the nicotine patch- not started  Substance Use Topics  . Alcohol use: No    Alcohol/week: 0.0 standard drinks  . Drug use: No    Ms.Lohmeyer reports that she has been smoking cigarettes. She started smoking about 48 years ago. She has a 20.00 pack-year smoking history. She has never used smokeless tobacco. She reports that she does not drink alcohol or use drugs.  Tobacco Cessation: Pt. Is a current every day smoker with a 30  pack smoking history. I have spent 4 minutes counseling patient on smoking cessation this visit. Patient verbalizes understanding of their  choice to continue smoking and the negative health consequences including worsening of COPD, risk of lung cancer stroke and heart disease.Marland Kitchen    Past surgical hx, Family hx, Social hx all reviewed.  Current Outpatient Medications  on File Prior to Visit  Medication Sig  . albuterol (PROVENTIL) (2.5 MG/3ML) 0.083% nebulizer solution TAKE 3 MLS (2.5 MG TOTAL) BY NEBULIZATION EVERY 6 (SIX) HOURS AS NEEDED FOR WHEEZING OR SHORTNESS OF BREATH.  Marland Kitchen albuterol (VENTOLIN HFA) 108 (90 Base) MCG/ACT inhaler USE 2 PUFFS EVERY 4 HOURS AS NEEDED  . amLODipine (NORVASC) 2.5 MG tablet Take 1 tablet (2.5 mg total) by mouth daily.  Marland Kitchen aspirin 81 MG tablet Take 81 mg by mouth daily. 1 tablet daily  . atenolol (TENORMIN) 25 MG tablet Take 1 tablet (25 mg total) by mouth daily.  . cetirizine (ZYRTEC) 10 MG tablet Take 10 mg by mouth daily as needed.  . CHANTIX STARTING MONTH PAK 0.5 MG X 11 & 1 MG X 42 tablet TAKE ONE 0.5MG  TAB DAILY FOR 3 DAYS, ONE 0.5MG  TAB TWICE A DAY FOR 4 DAYS, THEN INCREASE  TO ONE 1MG  TAB TWICE A DAY  . fluticasone (FLONASE) 50 MCG/ACT nasal spray Place 1 spray into both nostrils daily. As needed  . gabapentin (NEURONTIN) 100 MG capsule Take 100 mg by mouth as needed.   Marland Kitchen lisinopril-hydrochlorothiazide (ZESTORETIC) 20-12.5 MG tablet Take 1 tablet by mouth daily.  . metFORMIN (GLUCOPHAGE) 500 MG tablet Take 1,000 mg by mouth 2 (two) times daily with a meal. Extended release  . nitroGLYCERIN (NITROSTAT) 0.4 MG SL tablet Place 1 tablet (0.4 mg total) under the tongue every 5 (five) minutes as needed for chest pain. Maximum of 3 doses.  Marland Kitchen omeprazole (PRILOSEC) 20 MG capsule Take 20 mg by mouth as needed.   . simvastatin (ZOCOR) 20 MG tablet Take 20 mg by mouth daily.   Marland Kitchen SPIRIVA RESPIMAT 1.25 MCG/ACT AERS INHALE 2 PUFFS INTO THE LUNGS DAILY.  . SYMBICORT 160-4.5 MCG/ACT inhaler INHALE 2 PUFFS INTO THE LUNGS TWO TIMES DAILY  . TRUE METRIX BLOOD GLUCOSE TEST test strip    No current facility-administered medications on file prior to visit.     Allergies  Allergen Reactions  . Penicillins Other (See Comments)    Unsure- she's had it for so long    Review Of Systems:  Constitutional:   No  weight loss, night sweats,   Fevers, chills, fatigue, or  lassitude.  HEENT:   No headaches,  Difficulty swallowing,  Tooth/dental problems, or  Sore throat,                No sneezing, itching, ear ache, nasal congestion, post nasal drip,   CV:  No chest pain,  Orthopnea, PND, swelling in lower extremities, anasarca, dizziness, palpitations, syncope.   GI  No heartburn, indigestion, abdominal pain, nausea, vomiting, diarrhea, change in bowel habits, loss of appetite, bloody stools.   Resp: No shortness of breath with exertion or at rest.  Occasional  excess mucus, no productive cough,  + non-productive cough,  No coughing up of blood.  No change in color of mucus.  No wheezing.  No chest wall deformity  Skin: no rash or lesions.  GU: no dysuria, change in color of urine, no urgency or frequency.  No flank pain, no hematuria   MS:  No joint pain or swelling.  No decreased range of motion.  No back pain.  Psych:  No change in mood or affect. No depression or anxiety.  No memory loss.   Vital Signs There were no vitals taken for this visit.   Physical Exam:  General- No distress,  A&Ox3, pleasant ENT: No sinus tenderness, TM clear, pale nasal mucosa, no oral exudate,no post nasal drip, no LAN Cardiac: S1, S2, regular rate and rhythm, no murmur Chest: No wheeze/ rales/ dullness; no accessory muscle use, no nasal flaring, no sternal retractions Abd.: Soft Non-tende, ND, BS + Ext: No clubbing cyanosis, No edema Neuro:  normal strength, MAE x 4, A&O x 3 Skin: No rashes, no lesions, warm and dry Psych: normal mood and behavior   Assessment/Plan Mild COPD Plan Remember your Spiriva and Symbicort are your maintenance medications. Take these every day without fail. Take Spiriva 2 puffs once daily, rinse mouth after use Take Symbicort 2 puffs twice daily Rinse mouth after use Use your Albuterol as needed for breakthrough shortness of breath or wheezing We will send in renewal  prescriptions for Flonase.  Symbicort , Spiriva, and Albuterol.    OSA Great compliance  Plan Continue on CPAP at bedtime. You appear to be  benefiting from the treatment  Goal is to wear for at least 6 hours each night for maximal clinical benefit. Continue to work on weight loss, as the link between excess weight  and sleep apnea is well established.   Remember to establish a good bedtime routine, and work on sleep hygiene.  Limit daytime naps , avoid stimulants such as caffeine and nicotine close to bedtime, exercise daily to promote sleep quality, avoid heavy , spicy, fried , or rich foods before bed. Ensure adequate exposure to natural light during the day,establish a relaxing bedtime routine with a pleasant sleep environment ( Bedroom between 60 and 67 degrees, turn off bright lights , TV or device screens screens , consider black out curtains or white noise machines) Do not drive if sleepy. Remember to clean mask, tubing, filter, and reservoir once weekly with soapy water.  Follow up with Dr. Mortimer Fries  In 6 months  or before as needed.    Stable CT Chest Plan Annual Lung Cancer Screening with LDCT 06/2021  GERD Plan Continue Prilosec  Allergic Rhinitis Plan Continue Flonase and Zyrtec  Tobacco Abuse Currently smoking 5-8 cigarettes daily Has not started her Chantix Discussed Reduce to quit and nicotine replacement therapy Plan Smoking cessation counseling done   This appointment was 30 min long with over 50% of the time in direct face-to-face patient care, assessment, plan of care, and follow-up.   Magdalen Spatz, NP 02/12/2020  8:49 AM

## 2020-02-12 NOTE — Patient Instructions (Addendum)
It is good to see you today. Remember your Spiriva and Symbicort are your maintenance medications. Take these every day without fail. Take Spiriva 2 puffs once daily, rinse mouth after use Take Symbicort 2 puffs twice daily Rinse mouth after use Use your Albuterol as needed for breakthrough shortness of breath or wheezing We will send in renewal  prescriptions for Flonase. Symbicort , Spiriva, and Albuterol.  Continue your Zyrtec and Flonase as you have been doing.   Continue on CPAP at bedtime. You appear to be benefiting from the treatment  Goal is to wear for at least 6 hours each night for maximal clinical benefit. Continue to work on weight loss, as the link between excess weight  and sleep apnea is well established.   Remember to establish a good bedtime routine, and work on sleep hygiene.  Limit daytime naps , avoid stimulants such as caffeine and nicotine close to bedtime, exercise daily to promote sleep quality, avoid heavy , spicy, fried , or rich foods before bed. Ensure adequate exposure to natural light during the day,establish a relaxing bedtime routine with a pleasant sleep environment ( Bedroom between 60 and 67 degrees, turn off bright lights , TV or device screens screens , consider black out curtains or white noise machines) Do not drive if sleepy. Remember to clean mask, tubing, filter, and reservoir once weekly with soapy water.  Follow up with Dr. Mortimer Fries   In 6 months  or before as needed.    Please work on quitting smoking.  Follow up in 6 months  We will draw an A1C today for your Wellness check

## 2020-02-13 NOTE — Progress Notes (Signed)
Please call patient and let her know her HGB A1C is 5.9. Thanks so much. Hope you all are having a good day in [place]!

## 2020-02-14 DIAGNOSIS — F17210 Nicotine dependence, cigarettes, uncomplicated: Secondary | ICD-10-CM | POA: Diagnosis not present

## 2020-02-14 DIAGNOSIS — R300 Dysuria: Secondary | ICD-10-CM | POA: Diagnosis not present

## 2020-02-14 DIAGNOSIS — E119 Type 2 diabetes mellitus without complications: Secondary | ICD-10-CM | POA: Diagnosis not present

## 2020-02-14 DIAGNOSIS — I10 Essential (primary) hypertension: Secondary | ICD-10-CM | POA: Diagnosis not present

## 2020-02-14 DIAGNOSIS — J449 Chronic obstructive pulmonary disease, unspecified: Secondary | ICD-10-CM | POA: Diagnosis not present

## 2020-02-19 NOTE — Progress Notes (Signed)
Third attempt to reach patient. Letter sent with results from recent A1C lab study.

## 2020-02-20 ENCOUNTER — Telehealth: Payer: Self-pay | Admitting: Acute Care

## 2020-02-20 NOTE — Telephone Encounter (Signed)
I spoke with the pt and notified of her lab results and she verbalized understanding. Nothing further needed.

## 2020-02-20 NOTE — Telephone Encounter (Signed)
Pt returning a phone call. Pt can be reached at [phone].

## 2020-02-20 NOTE — Telephone Encounter (Signed)
Per result note 02/12/20  Please call patient and let her know her HGB A1C is 5.9. Thanks so much. Hope you all are having a good day in [place]!  LMTCB x 1

## 2020-02-27 DIAGNOSIS — M654 Radial styloid tenosynovitis [de Quervain]: Secondary | ICD-10-CM | POA: Diagnosis not present

## 2020-03-09 DIAGNOSIS — G4733 Obstructive sleep apnea (adult) (pediatric): Secondary | ICD-10-CM | POA: Diagnosis not present

## 2020-04-09 ENCOUNTER — Other Ambulatory Visit: Payer: Self-pay | Admitting: Orthopedic Surgery

## 2020-04-09 DIAGNOSIS — M47896 Other spondylosis, lumbar region: Secondary | ICD-10-CM | POA: Diagnosis not present

## 2020-04-14 ENCOUNTER — Other Ambulatory Visit: Payer: Self-pay | Admitting: Internal Medicine

## 2020-04-23 NOTE — Telephone Encounter (Signed)
Pt seen Eric Form, NP on 02/12/2020. Results were reviewed during that OV.

## 2020-05-11 ENCOUNTER — Other Ambulatory Visit: Payer: Self-pay | Admitting: Family Medicine

## 2020-05-11 DIAGNOSIS — M5489 Other dorsalgia: Secondary | ICD-10-CM | POA: Diagnosis not present

## 2020-05-11 DIAGNOSIS — F17210 Nicotine dependence, cigarettes, uncomplicated: Secondary | ICD-10-CM | POA: Diagnosis not present

## 2020-05-11 DIAGNOSIS — E119 Type 2 diabetes mellitus without complications: Secondary | ICD-10-CM | POA: Diagnosis not present

## 2020-05-11 DIAGNOSIS — R7309 Other abnormal glucose: Secondary | ICD-10-CM | POA: Diagnosis not present

## 2020-05-11 DIAGNOSIS — Z1389 Encounter for screening for other disorder: Secondary | ICD-10-CM | POA: Diagnosis not present

## 2020-05-11 DIAGNOSIS — I2510 Atherosclerotic heart disease of native coronary artery without angina pectoris: Secondary | ICD-10-CM | POA: Diagnosis not present

## 2020-05-14 ENCOUNTER — Encounter: Payer: Self-pay | Admitting: Internal Medicine

## 2020-05-14 ENCOUNTER — Other Ambulatory Visit: Payer: Self-pay

## 2020-05-14 ENCOUNTER — Ambulatory Visit: Payer: 59 | Admitting: Internal Medicine

## 2020-05-14 VITALS — BP 120/68 | HR 85 | Ht 63.0 in | Wt 165.1 lb

## 2020-05-14 DIAGNOSIS — I10 Essential (primary) hypertension: Secondary | ICD-10-CM

## 2020-05-14 DIAGNOSIS — Z720 Tobacco use: Secondary | ICD-10-CM

## 2020-05-14 DIAGNOSIS — I25118 Atherosclerotic heart disease of native coronary artery with other forms of angina pectoris: Secondary | ICD-10-CM | POA: Diagnosis not present

## 2020-05-14 DIAGNOSIS — E782 Mixed hyperlipidemia: Secondary | ICD-10-CM

## 2020-05-14 NOTE — Patient Instructions (Signed)
Medication Instructions:  Your physician recommends that you continue on your current medications as directed. Please refer to the Current Medication list given to you today.  *If you need a refill on your cardiac medications before your next appointment, please call your pharmacy*  Lab Work: Your physician recommends that you return for lab work in: 2 MONTHS. Around July 14, 2020 to check LIPID, ALT.  - You will need to be fasting. Please do not have anything to eat or drink after midnight the morning you have the lab work. You may only have water or black coffee with no cream or sugar. - Please go to the [facility]. You will check in at the front desk to the right as you walk into the atrium. Valet Parking is offered if needed. - No appointment needed. You may go any day between 7 am and 6 pm.  If you have labs (blood work) drawn today and your tests are completely normal, you will receive your results only by: Marland Kitchen MyChart Message (if you have MyChart) OR . A paper copy in the mail If you have any lab test that is abnormal or we need to change your treatment, we will call you to review the results.  Testing/Procedures: none  Follow-Up: At [facility], you and your health needs are our priority.  As part of our continuing mission to provide you with exceptional heart care, we have created designated Provider Care Teams.  These Care Teams include your primary Cardiologist (physician) and Advanced Practice Providers (APPs -  Physician Assistants and Nurse Practitioners) who all work together to provide you with the care you need, when you need it.  We recommend signing up for the patient portal called "MyChart".  Sign up information is provided on this After Visit Summary.  MyChart is used to connect with patients for Virtual Visits (Telemedicine).  Patients are able to view lab/test results, encounter notes, upcoming appointments, etc.  Non-urgent messages can be sent to your  provider as well.   To learn more about what you can do with MyChart, go to [email or web address].    Your next appointment:   6 month(s)  The format for your next appointment:   In Person  Provider:    You may see [name], MD or one of the following Advanced Practice Providers on your designated Care Team:    Murray Hodgkins, NP  Christell Faith, PA-C  Marrianne Mood, PA-C

## 2020-05-14 NOTE — Progress Notes (Signed)
Follow-up Outpatient Visit Date: 05/14/2020  Primary Care Provider: Rejeana Brock, MD [address] [place] [address]  Chief Complaint: Follow-up coronary artery disease  HPI:  Monica Oliver is a 69 y.o. female with history of CAD with stable angina, HTN, HLD, DM2, COPD, OSA, shingles, and tobacco use, who presents for follow-up of coronary artery disease.  We last spoke via virtual visit in April, at which time Monica Oliver endorsed minimal angina following addition of amlodipine.  She noted gradual worsening of her chronic exertional dyspnea, which she attributed to COPD.  She continues to smoke and has yet to try Chantix that was previously prescribed.  Due to migratory myalgias, we agreed to a statin holiday.  Monica Oliver noticed improvement in her myalgias and was subsequently switched from simvastatin to ezetimibe.  She has been taking this for about a month and is tolerating it well.  Monica Oliver denies any chest pain since our last visit.  She has stable exertional dyspnea when walking extended distances.  She has been taking Chantix again for about 2 weeks and is hopeful that she will be able to quit smoking altogether this time.  She has not had any palpitations, lightheadedness, or edema.  She is tolerating her current medications well.  --------------------------------------------------------------------------------------------------  Cardiovascular History & Procedures: Cardiovascular Problems:  Coronary artery disease  Risk Factors:  Hypertension, hyperlipidemia, diabetes mellitus, obesity, tobacco use, and age greater than 67; coronary artery and aortic atherosclerotic calcification noted on noncontrast CT of the chest (07/16/2018-personally reviewed)  Cath/PCI:  None  CV Surgery:  None  EP Procedures and Devices:  None  Non-Invasive Evaluation(s):  TTE (11/06/2018): Normal LV size with moderate LVH.  LVEF 55-60% with grade 1 diastolic dysfunction.  Mild  mitral annular calcification.  Normal RV size and function.  Unable to assess RVSP.  Cardiac CTA (09/10/18): CAC score 306 (91st percentile). LMCA normal. LAD normal. LCx relatively small with <50% stenosis in mid vessel. Small ramus normal. RCA with <50% stenosis in mid vessel. 51-69% stenosis in distal RCA/rPL (CTFFR of rPL 0.52).   Right lower extremity venous duplex (04/22/2011): No evidence of DVT in the right lower extremity.  Recent CV Pertinent Labs: Lab Results  Component Value Date   K 3.6 01/28/2019   K 3.6 03/15/2014   BUN 19 01/28/2019   BUN 11 03/15/2014   CREATININE 1.10 (H) 01/28/2019   CREATININE 1.01 03/15/2014    Past medical and surgical history were reviewed and updated in EPIC.  Current Meds  Medication Sig   albuterol (PROVENTIL) (2.5 MG/3ML) 0.083% nebulizer solution TAKE 3 MLS (2.5 MG TOTAL) BY NEBULIZATION EVERY 6 (SIX) HOURS AS NEEDED FOR WHEEZING OR SHORTNESS OF BREATH.   albuterol (VENTOLIN HFA) 108 (90 Base) MCG/ACT inhaler USE 2 PUFFS EVERY 4 HOURS AS NEEDED   amLODipine (NORVASC) 2.5 MG tablet Take 1 tablet (2.5 mg total) by mouth daily.   aspirin 81 MG tablet Take 81 mg by mouth daily. 1 tablet daily   atenolol (TENORMIN) 25 MG tablet Take 1 tablet (25 mg total) by mouth daily.   budesonide-formoterol (SYMBICORT) 160-4.5 MCG/ACT inhaler INHALE 2 PUFFS INTO THE LUNGS TWO TIMES DAILY   celecoxib (CELEBREX) 200 MG capsule Take 200 mg by mouth daily as needed.    cetirizine (ZYRTEC) 10 MG tablet Take 10 mg by mouth daily as needed.   ezetimibe (ZETIA) 10 MG tablet Take 10 mg by mouth daily.    fluticasone (FLONASE) 50 MCG/ACT nasal spray Place 1  spray into both nostrils daily. As needed   gabapentin (NEURONTIN) 100 MG capsule Take 100 mg by mouth as needed.    lisinopril-hydrochlorothiazide (ZESTORETIC) 20-12.5 MG tablet TAKE 1 TABLET BY MOUTH DAILY.   metFORMIN (GLUCOPHAGE) 500 MG tablet Take 1,000 mg by mouth 2 (two) times daily with a  meal. Extended release   nitroGLYCERIN (NITROSTAT) 0.4 MG SL tablet Place 1 tablet (0.4 mg total) under the tongue every 5 (five) minutes as needed for chest pain. Maximum of 3 doses.   omeprazole (PRILOSEC) 20 MG capsule Take 20 mg by mouth as needed.    Tiotropium Bromide Monohydrate (SPIRIVA RESPIMAT) 1.25 MCG/ACT AERS INHALE 2 PUFFS INTO THE LUNGS DAILY.   TRUE METRIX BLOOD GLUCOSE TEST test strip     Allergies: Penicillins  Social History   Tobacco Use   Smoking status: Current Every Day Smoker    Packs/day: 0.75    Years: 40.00    Pack years: 30.00    Types: Cigarettes    Start date: 12/15/1971   Smokeless tobacco: Never Used   Tobacco comment: 02/12/20: smoking 5 ciggaretts daily  Vaping Use   Vaping Use: Never used  Substance Use Topics   Alcohol use: No    Alcohol/week: 0.0 standard drinks   Drug use: No    Family History  Problem Relation Age of Onset   Hypertension Mother    Cancer Mother        lung   Cancer Father        prostate   Hyperlipidemia Father    Heart attack Father 47   Cancer Brother        prostate   Cirrhosis Sister    Hepatitis Sister    Breast cancer Paternal Aunt     Review of Systems: A 12-system review of systems was performed and was negative except as noted in the HPI.  --------------------------------------------------------------------------------------------------  Physical Exam: BP 120/68 (BP Location: Left Arm, Patient Position: Sitting, Cuff Size: Normal)    Pulse 85    Ht 5\' 3"  (1.6 m)    Wt 165 lb 2 oz (74.9 kg)    SpO2 98%    BMI 29.25 kg/m   General: NAD. Lungs: Mildly diminished breath sounds throughout without wheezes or crackles. Heart: Regular rate and rhythm without murmurs, rubs, or gallops. Abdomen: Soft, nontender, nondistended. Extremities: No lower extremity edema.  EKG: Normal sinus rhythm with borderline prolonged QT (QTc 471 ms).  Otherwise, no significant abnormality.  Lab Results    Component Value Date   WBC 11.6 (H) 11/28/2017   HGB 15.2 11/28/2017   HCT 45.6 11/28/2017   MCV 86.8 11/28/2017   PLT 250 11/28/2017    Lab Results  Component Value Date   NA 132 (L) 01/28/2019   K 3.6 01/28/2019   CL 93 (L) 01/28/2019   CO2 27 01/28/2019   BUN 19 01/28/2019   CREATININE 1.10 (H) 01/28/2019   GLUCOSE 88 01/28/2019   ALT 19 09/24/2016    No results found for: CHOL, HDL, LDLCALC, LDLDIRECT, TRIG, CHOLHDL  --------------------------------------------------------------------------------------------------  ASSESSMENT AND PLAN: Coronary artery disease with stable angina: Monica Oliver reports stable exertional dyspnea that is likely multifactorial and no significant chest pain on her current antianginal regimen consisting of low-dose amlodipine and atenolol.  We will plan to continue amlodipine, atenolol, and low-dose aspirin.  If she were to have recurrent angina, we would need to proceed with cardiac catheterization and possible PCI, given significant single-vessel CAD involving the distal RCA  on prior CTA.  Hypertension: Blood pressure well controlled today.  No medication changes at this time.  Hyperlipidemia: Monica Oliver has been intolerant of simvastatin therapy due to myalgias.  She is currently on ezetimibe alone, which she has been tolerating well.  We will plan to repeat a fasting lipid panel and ALT in about 2 months to reassess her cholesterol.  If LDL remains above 70, addition of an alternative statin or nonstatin therapy will need to be considered.  Tobacco abuse: Monica Oliver has been using Chantix for the last 2 weeks and is no longer smoking.  I congratulated her on this and encouraged her to continue the Chantix regimen in an effort to remain off cigarettes.  Follow-up: Return to clinic in 6 months.  Monica Bush, MD 05/14/2020 9:51 AM

## 2020-05-17 ENCOUNTER — Other Ambulatory Visit: Payer: Self-pay | Admitting: Family Medicine

## 2020-05-19 ENCOUNTER — Other Ambulatory Visit: Payer: Self-pay | Admitting: Family Medicine

## 2020-06-10 ENCOUNTER — Other Ambulatory Visit: Payer: Self-pay | Admitting: Internal Medicine

## 2020-06-15 ENCOUNTER — Telehealth: Payer: Self-pay | Admitting: Acute Care

## 2020-06-15 NOTE — Telephone Encounter (Signed)
Lm for patient.  

## 2020-06-16 NOTE — Telephone Encounter (Signed)
Lm x2 for patient.   

## 2020-06-16 NOTE — Telephone Encounter (Signed)
Patient is returning phone call. Patient phone number is [phone]. 

## 2020-06-17 NOTE — Telephone Encounter (Signed)
Called and spoke to patient. Patient is questioning if she should have lung scans once or twice a year with the lung cancer screening program.  Patient is aware that she should have yearly CT's with this program. She is due for CT 06/2020. She will contact Shawn perkins to schedule CT.  Nothing further needed at this time.

## 2020-06-19 ENCOUNTER — Other Ambulatory Visit: Payer: Self-pay | Admitting: Family Medicine

## 2020-06-19 DIAGNOSIS — F17200 Nicotine dependence, unspecified, uncomplicated: Secondary | ICD-10-CM

## 2020-06-19 DIAGNOSIS — G4733 Obstructive sleep apnea (adult) (pediatric): Secondary | ICD-10-CM | POA: Diagnosis not present

## 2020-07-06 ENCOUNTER — Other Ambulatory Visit: Payer: Self-pay | Admitting: Family Medicine

## 2020-07-06 DIAGNOSIS — Z1231 Encounter for screening mammogram for malignant neoplasm of breast: Secondary | ICD-10-CM

## 2020-07-08 ENCOUNTER — Ambulatory Visit
Admission: RE | Admit: 2020-07-08 | Discharge: 2020-07-08 | Disposition: A | Discharge status: Home / Self Care | Payer: 59 | Source: Ambulatory Visit | Attending: Family Medicine | Admitting: Family Medicine

## 2020-07-08 ENCOUNTER — Other Ambulatory Visit: Payer: Self-pay

## 2020-07-08 DIAGNOSIS — F17200 Nicotine dependence, unspecified, uncomplicated: Secondary | ICD-10-CM | POA: Diagnosis not present

## 2020-07-08 DIAGNOSIS — J439 Emphysema, unspecified: Secondary | ICD-10-CM | POA: Diagnosis not present

## 2020-07-14 ENCOUNTER — Other Ambulatory Visit: Payer: Self-pay | Admitting: Internal Medicine

## 2020-08-06 ENCOUNTER — Ambulatory Visit
Admission: RE | Admit: 2020-08-06 | Discharge: 2020-08-06 | Disposition: A | Discharge status: Home / Self Care | Payer: 59 | Source: Ambulatory Visit | Attending: Family Medicine | Admitting: Family Medicine

## 2020-08-06 ENCOUNTER — Other Ambulatory Visit: Payer: Self-pay

## 2020-08-06 DIAGNOSIS — Z1231 Encounter for screening mammogram for malignant neoplasm of breast: Secondary | ICD-10-CM | POA: Diagnosis not present

## 2020-08-06 DIAGNOSIS — H52223 Regular astigmatism, bilateral: Secondary | ICD-10-CM | POA: Diagnosis not present

## 2020-08-14 ENCOUNTER — Other Ambulatory Visit: Payer: Self-pay

## 2020-08-14 ENCOUNTER — Other Ambulatory Visit: Payer: Self-pay | Admitting: Internal Medicine

## 2020-08-14 ENCOUNTER — Encounter: Payer: Self-pay | Admitting: Internal Medicine

## 2020-08-14 ENCOUNTER — Ambulatory Visit: Payer: 59 | Admitting: Internal Medicine

## 2020-08-14 VITALS — BP 132/78 | HR 87 | Temp 97.0°F | Ht 63.0 in | Wt 174.6 lb

## 2020-08-14 DIAGNOSIS — R9389 Abnormal findings on diagnostic imaging of other specified body structures: Secondary | ICD-10-CM | POA: Diagnosis not present

## 2020-08-14 DIAGNOSIS — R0602 Shortness of breath: Secondary | ICD-10-CM | POA: Diagnosis not present

## 2020-08-14 DIAGNOSIS — J449 Chronic obstructive pulmonary disease, unspecified: Secondary | ICD-10-CM

## 2020-08-14 DIAGNOSIS — F17210 Nicotine dependence, cigarettes, uncomplicated: Secondary | ICD-10-CM | POA: Diagnosis not present

## 2020-08-14 MED ORDER — ALBUTEROL SULFATE HFA 108 (90 BASE) MCG/ACT IN AERS: INHALATION_SPRAY | RESPIRATORY_TRACT | 12 refills | Status: DC

## 2020-08-14 MED ORDER — SPIRIVA RESPIMAT 1.25 MCG/ACT IN AERS: INHALATION_SPRAY | RESPIRATORY_TRACT | 5 refills | Status: DC

## 2020-08-14 NOTE — Patient Instructions (Addendum)
Please stop smoking continue CPAP as prescribed Plan to hold Symbicort and assess breathing status Continue Spiriva as prescribed Albuterol as needed Obtain pulmonary function testing Repeat CT chest in 1 year

## 2020-08-14 NOTE — Progress Notes (Signed)
PULMONARY OFFICE FOLLOW UP NOTE  PROBLEMS:  Mild COPD Smoker  DATA: LDCT 06/07/16: normal PFTs 06/13/16: mild obstruction with borderline significant improvement after bronchodilator. Normal TLC. Mildly reduced DLCO  INTERVAL HISTORY: Previously seen by VM and diagnosed with mild COPD.    CC  Follow up COPD Follow up OSA Follow up Abnormal CT chest      HPI Dx of OSA CPAP 8 cm h20 100% compliance 30 days 97% compliance >4 hrs AHI 0.2  Benefits and use of CPAP therapy nightly Wears Full facemask  No exacerbation at this time No evidence of heart failure at this time No evidence or signs of infection at this time No respiratory distress No fevers, chills, nausea, vomiting, diarrhea No evidence of lower extremity edema No evidence hemoptysis   Previous history of bilateral lung nodules CT of the chest reviewed 1 year ago I  will review current CT chest findings with the patient    Smoking Assessment and Cessation Counseling   Upon further questioning, Patient smokes 1 ppd  I have advised patient to quit/stop smoking as soon as possible due to high risk for multiple medical problems  Patientis NOT willing to quit smoking  I have advised patient that we can assist and have options of Nicotine replacement therapy. I also advised patient on behavioral therapy and can provide oral medication therapy in conjunction with the other therapies  Follow up next Office visit  for assessment of smoking cessation  Smoking cessation counseling advised for 4 minutes     Review of Systems:  Gen:  Denies  fever, sweats, chills weight loss  HEENT: Denies blurred vision, double vision, ear pain, eye pain, hearing loss, nose bleeds, sore throat Cardiac:  No dizziness, chest pain or heaviness, chest tightness,edema, No JVD Resp:   No cough, -sputum production, -shortness of breath,-wheezing, -hemoptysis,  Gi: Denies swallowing difficulty, stomach pain, nausea or vomiting,  diarrhea, constipation, bowel incontinence Gu:  Denies bladder incontinence, burning urine Ext:   Denies Joint pain, stiffness or swelling Skin: Denies  skin rash, easy bruising or bleeding or hives Endoc:  Denies polyuria, polydipsia , polyphagia or weight change Psych:   Denies depression, insomnia or hallucinations  Other:  All other systems negative    BP 132/78 (BP Location: Left Arm, Cuff Size: Normal)   Pulse 87   Temp (!) 97 F (36.1 C) (Temporal)   Ht 5\' 3"  (1.6 m)   Wt 174 lb 9.6 oz (79.2 kg)   SpO2 98%   BMI 30.93 kg/m   Physical Examination:   General Appearance: No distress  Neuro:without focal findings,  speech normal,  HEENT: PERRLA, EOM intact.   Pulmonary: normal breath sounds, No wheezing.  CardiovascularNormal S1,S2.  No m/r/g.   Abdomen: Benign, Soft, non-tender. Renal:  No costovertebral tenderness  GU:  Not performed at this time. Endoc: No evident thyromegaly Skin:   warm, no rashes, no ecchymosis  Extremities: normal, no cyanosis, clubbing. PSYCHIATRIC: Mood, affect within normal limits.   ALL OTHER ROS ARE NEGATIVE        DATA:   CXR 09/24/16: vague left upper lobe opacity   CXR 11/10/16 : resolution of LUL opacity CT chest 07/16/2018-and discrete bilateral pulmonary nodules not visualized but reported Mild lymphadenopathy anterior tracheal space  CT chest 06/2020-INDEPENDANTLY REVIEWED WITH PATIENT No suspicious focal pulmonary nodules or infiltrates. Stable subtle peribronchial nodularity within the lung apices bilaterally possibly representing changes of RB-ILD/smoking related lung disease.  CT scan results reviewed with patient  in detail   IMPRESSION:  69 year old pleasant female with underlying diagnosis of COPD with obstructive sleep apnea in the setting of a history of bilateral scattered pulmonary nodules in the setting of tobacco abuse  COPD mild gold stage A Continue inhalers as prescribed with  Spiriva Plan to hold  Symbicort and assess breathing status Albuterol as needed No COPD exacerbation at this time Consider repeating pulmonary function testing to assess lung function  OSA Patient has excellent compliance report and is benefiting from CPAP therapy 8 cm h20 Continue as prescribed AHI down to 0.2   Smoking cessation strongly advised  Obesity -recommend significant weight loss -recommend changing diet  Deconditioned state -Recommend increased daily activity and exercise  Abnormal CT chest findings  Previous CT chest  with discrete pulmonary nodules with some anterior tracheal adenopathy Current CT chest July 08, 2020 No suspicious focal pulmonary nodules or infiltrates. Stable subtle peribronchial nodularity within the lung apices bilaterally possibly representing changes of RB-ILD/smoking related lung disease.  CT scan results reviewed with patient in detail       COVID-19 EDUCATION: The signs and symptoms of COVID-19 were discussed with the patient and how to seek care for testing.  The importance of social distancing was discussed today. Hand Washing Techniques and avoid touching face was advised.     MEDICATION ADJUSTMENTS/LABS AND TESTS ORDERED: Please stop smoking continue CPAP as prescribed Plan to hold Symbicort and assess breathing status Continue Spiriva as prescribed Albuterol as needed Obtain pulmonary function testing Repeat CT chest in 1 year   CURRENT MEDICATIONS REVIEWED AT LENGTH WITH PATIENT TODAY   Patient satisfied with Plan of action and management. All questions answered  Follow up in 6 months  TOTAL TIME SPENT 34 minutes  Corrin Parker, M.D.  [facility]  Medical Director [place] Director [facility] Cardio-Pulmonary Department

## 2020-08-19 ENCOUNTER — Other Ambulatory Visit: Payer: Self-pay | Admitting: Family Medicine

## 2020-08-19 DIAGNOSIS — F17210 Nicotine dependence, cigarettes, uncomplicated: Secondary | ICD-10-CM | POA: Diagnosis not present

## 2020-08-19 DIAGNOSIS — E559 Vitamin D deficiency, unspecified: Secondary | ICD-10-CM | POA: Diagnosis not present

## 2020-08-19 DIAGNOSIS — E119 Type 2 diabetes mellitus without complications: Secondary | ICD-10-CM | POA: Diagnosis not present

## 2020-08-19 DIAGNOSIS — Z0000 Encounter for general adult medical examination without abnormal findings: Secondary | ICD-10-CM | POA: Diagnosis not present

## 2020-08-19 DIAGNOSIS — I2510 Atherosclerotic heart disease of native coronary artery without angina pectoris: Secondary | ICD-10-CM | POA: Diagnosis not present

## 2020-08-19 DIAGNOSIS — M5489 Other dorsalgia: Secondary | ICD-10-CM | POA: Diagnosis not present

## 2020-08-19 DIAGNOSIS — F411 Generalized anxiety disorder: Secondary | ICD-10-CM | POA: Diagnosis not present

## 2020-08-19 DIAGNOSIS — J449 Chronic obstructive pulmonary disease, unspecified: Secondary | ICD-10-CM | POA: Diagnosis not present

## 2020-08-19 DIAGNOSIS — I10 Essential (primary) hypertension: Secondary | ICD-10-CM | POA: Diagnosis not present

## 2020-08-19 DIAGNOSIS — E785 Hyperlipidemia, unspecified: Secondary | ICD-10-CM | POA: Diagnosis not present

## 2020-08-25 ENCOUNTER — Other Ambulatory Visit: Payer: Self-pay | Admitting: Family Medicine

## 2020-08-25 DIAGNOSIS — E559 Vitamin D deficiency, unspecified: Secondary | ICD-10-CM

## 2020-10-14 DIAGNOSIS — G4733 Obstructive sleep apnea (adult) (pediatric): Secondary | ICD-10-CM | POA: Diagnosis not present

## 2020-10-20 DIAGNOSIS — Z1389 Encounter for screening for other disorder: Secondary | ICD-10-CM | POA: Diagnosis not present

## 2020-10-20 DIAGNOSIS — R300 Dysuria: Secondary | ICD-10-CM | POA: Diagnosis not present

## 2020-10-21 ENCOUNTER — Other Ambulatory Visit: Payer: Self-pay | Admitting: Internal Medicine

## 2020-10-22 ENCOUNTER — Other Ambulatory Visit: Payer: Self-pay | Admitting: Family Medicine

## 2020-10-28 DIAGNOSIS — I10 Essential (primary) hypertension: Secondary | ICD-10-CM | POA: Diagnosis not present

## 2020-10-28 DIAGNOSIS — M5489 Other dorsalgia: Secondary | ICD-10-CM | POA: Diagnosis not present

## 2020-11-25 DIAGNOSIS — M189 Osteoarthritis of first carpometacarpal joint, unspecified: Secondary | ICD-10-CM | POA: Diagnosis not present

## 2020-11-25 DIAGNOSIS — M654 Radial styloid tenosynovitis [de Quervain]: Secondary | ICD-10-CM | POA: Diagnosis not present

## 2020-11-25 DIAGNOSIS — M461 Sacroiliitis, not elsewhere classified: Secondary | ICD-10-CM | POA: Diagnosis not present

## 2020-12-16 ENCOUNTER — Other Ambulatory Visit
Admission: RE | Admit: 2020-12-16 | Discharge: 2020-12-16 | Disposition: A | Discharge status: Home / Self Care | Payer: 59 | Source: Ambulatory Visit | Attending: Internal Medicine | Admitting: Internal Medicine

## 2020-12-16 ENCOUNTER — Telehealth: Payer: Self-pay | Admitting: *Deleted

## 2020-12-16 ENCOUNTER — Encounter: Payer: Self-pay | Admitting: Internal Medicine

## 2020-12-16 ENCOUNTER — Other Ambulatory Visit: Payer: Self-pay

## 2020-12-16 ENCOUNTER — Other Ambulatory Visit: Payer: Self-pay | Admitting: Internal Medicine

## 2020-12-16 ENCOUNTER — Ambulatory Visit: Payer: 59 | Admitting: Internal Medicine

## 2020-12-16 VITALS — BP 110/60 | HR 87 | Ht 63.0 in | Wt 174.0 lb

## 2020-12-16 DIAGNOSIS — Z01810 Encounter for preprocedural cardiovascular examination: Secondary | ICD-10-CM | POA: Insufficient documentation

## 2020-12-16 DIAGNOSIS — Z720 Tobacco use: Secondary | ICD-10-CM

## 2020-12-16 DIAGNOSIS — I200 Unstable angina: Secondary | ICD-10-CM | POA: Diagnosis not present

## 2020-12-16 DIAGNOSIS — E1169 Type 2 diabetes mellitus with other specified complication: Secondary | ICD-10-CM | POA: Insufficient documentation

## 2020-12-16 DIAGNOSIS — E785 Hyperlipidemia, unspecified: Secondary | ICD-10-CM | POA: Diagnosis not present

## 2020-12-16 DIAGNOSIS — I10 Essential (primary) hypertension: Secondary | ICD-10-CM

## 2020-12-16 LAB — CBC
HCT: 31.6 % — ABNORMAL LOW (ref 36.0–46.0)
Hemoglobin: 9.7 g/dL — ABNORMAL LOW (ref 12.0–15.0)
MCH: 24 pg — ABNORMAL LOW (ref 26.0–34.0)
MCHC: 30.7 g/dL (ref 30.0–36.0)
MCV: 78.2 fL — ABNORMAL LOW (ref 80.0–100.0)
Platelets: 440 10*3/uL — ABNORMAL HIGH (ref 150–400)
RBC: 4.04 MIL/uL (ref 3.87–5.11)
RDW: 15.9 % — ABNORMAL HIGH (ref 11.5–15.5)
WBC: 9.3 10*3/uL (ref 4.0–10.5)
nRBC: 0 % (ref 0.0–0.2)

## 2020-12-16 LAB — COMPREHENSIVE METABOLIC PANEL
ALT: 12 U/L (ref 0–44)
AST: 19 U/L (ref 15–41)
Albumin: 3.8 g/dL (ref 3.5–5.0)
Alkaline Phosphatase: 69 U/L (ref 38–126)
Anion gap: 10 (ref 5–15)
BUN: 10 mg/dL (ref 8–23)
CO2: 26 mmol/L (ref 22–32)
Calcium: 9.4 mg/dL (ref 8.9–10.3)
Chloride: 100 mmol/L (ref 98–111)
Creatinine, Ser: 0.8 mg/dL (ref 0.44–1.00)
GFR, Estimated: >60 mL/min (ref >60)
Glucose, Bld: 108 mg/dL — ABNORMAL HIGH (ref 70–99)
Potassium: 4.4 mmol/L (ref 3.5–5.1)
Sodium: 136 mmol/L (ref 135–145)
Total Bilirubin: 0.5 mg/dL (ref 0.3–1.2)
Total Protein: 6.9 g/dL (ref 6.5–8.1)

## 2020-12-16 LAB — LIPID PANEL
Cholesterol: 161 mg/dL (ref 0–200)
HDL: 55 mg/dL (ref >40)
LDL Cholesterol: 63 mg/dL (ref 0–99)
Total CHOL/HDL Ratio: 2.9 RATIO
Triglycerides: 213 mg/dL — ABNORMAL HIGH (ref <150)
VLDL: 43 mg/dL — ABNORMAL HIGH (ref 0–40)

## 2020-12-16 MED ORDER — METOPROLOL TARTRATE 25 MG PO TABS: 25.0000 mg | ORAL_TABLET | Freq: Two times a day (BID) | ORAL | 2 refills | Status: DC

## 2020-12-16 NOTE — Telephone Encounter (Signed)
-----   Message from [name], MD sent at 12/16/2020  3:18 PM EDT ----- Please let Ms. Alleman know that her labs are notable for normal kidney function, liver function, and electrolytes.  Her LDL is well controlled.  Her CBC shows moderate anemia with low MCV, new compared to the prior labs in our system from 3 years ago.  Findings are concerning for chronic blood loss.  We may need to delay her catheterization pending further work-up of her anemia.  Please reach out to her PCP to determine if she has had any CBCs done within the last 3.  Ms. Hamric should also reach out to her PCP as soon as possible for further evaluation.

## 2020-12-16 NOTE — Patient Instructions (Addendum)
Medication Instructions:  Your physician has recommended you make the following change in your medication:  1- STOP Atenolol. 2- START Metoprolol tartrate 25 mg by mouth two times a day.  *If you need a refill on your cardiac medications before your next appointment, please call your pharmacy*  Lab Work: Your physician recommends that you return for lab work in: TODAY - CBC, CMP, TSH. - Please go to the [facility]. You will check in at the front desk to the right as you walk into the atrium. Valet Parking is offered if needed. - No appointment needed. You may go any day between 7 am and 6 pm.   COVID PRE- TEST: You will need a COVID TEST prior to the procedure: ON Friday, December 25, 2020.  Testing location: Pre-Admit testing office, [address] in the [facility] located on the [facility] campus at [address], [place], [place] [address].  Testing will be performed Monday through Friday between the hours of 8am and 2pm.   If you have labs (blood work) drawn today and your tests are completely normal, you will receive your results only by: Marland Kitchen MyChart Message (if you have MyChart) OR . A paper copy in the mail If you have any lab test that is abnormal or we need to change your treatment, we will call you to review the results.   Testing/Procedures:  You are scheduled for a LEFT Cardiac Catheterization on Tuesday, April 5 with Dr. Harrell Gave End.  1. Please arrive at the [facility] [facility] at 10 AM (This time is ONE HOUR before your procedure to ensure your preparation). Free valet parking service is available.   Special note: Every effort is made to have your procedure done on time. Please understand that emergencies sometimes delay scheduled procedures.  2. Diet: Do not eat solid foods after midnight.  The patient may have clear liquids until 5am upon the day of the procedure.  3. Labs: You will need to have blood drawn on TODAY.  You do not  need to be fasting.  4. Medication instructions in preparation for your procedure:   Contrast Allergy: No  Do not take Diabetes Med Glucophage (Metformin) on the day of the procedure and HOLD 48 HOURS AFTER THE PROCEDURE.  On the morning of your procedure, take your Aspirin and any morning medicines NOT listed above.  You may use sips of water.  5. Plan for one night stay--bring personal belongings. 6. Bring a current list of your medications and current insurance cards. 7. You MUST have a responsible person to drive you home. 8. Someone MUST be with you the first 24 hours after you arrive home or your discharge will be delayed. 9. Please wear clothes that are easy to get on and off and wear slip-on shoes.  Thank you for allowing [place] to care for you!   -- [place] [facility]    Follow-Up: At [facility], you and your health needs are our priority.  As part of our continuing mission to provide you with exceptional heart care, we have created designated Provider Care Teams.  These Care Teams include your primary Cardiologist (physician) and Advanced Practice Providers (APPs -  Physician Assistants and Nurse Practitioners) who all work together to provide you with the care you need, when you need it.  We recommend signing up for the patient portal called "MyChart".  Sign up information is provided on this After Visit Summary.  MyChart  is used to connect with patients for Virtual Visits (Telemedicine).  Patients are able to view lab/test results, encounter notes, upcoming appointments, etc.  Non-urgent messages can be sent to your provider as well.   To learn more about what you can do with MyChart, go to [email or web address].    Your next appointment:   2 week(s)  AFTER THE CATH The format for your next appointment:   In Person  Provider:   You may see [name], MD or one of the following Advanced Practice Providers on your designated Care Team:     Murray Hodgkins, NP  Christell Faith, PA-C  Marrianne Mood, PA-C  Cadence [place], [place]  Laurann [place], NP

## 2020-12-16 NOTE — H&P (View-Only) (Signed)
Follow-up Outpatient Visit Date: 12/16/2020  Primary Care Provider: Rejeana Brock, MD [address] [place] [address]  Chief Complaint: Chest pain  HPI:  Monica Oliver is a 70 y.o. female with history of coronary artery disease with stable angina, hypertension, hyperlipidemia, type 2 diabetes mellitus, COPD, obstructive sleep apnea, shingles, and tobacco use, who presents for follow-up of coronary artery disease.  I last saw her in 04/2020, at which time she reported stable exertional dyspnea when walking extended dyspnea.  She was in the process of trying to quit smoking.  She reported improvement in myalgias after switching from simvastatin to ezetimibe.  We did not make any medication changes or pursue additional testing.  Today, Monica Oliver reports that she has been experiencing increasing chest pain since our last visit.  When she walks uphill or walks extended distances, she will experience a brief sharp pain in her left chest and only lasts a second followed by tightness.  This gradually resolves after she rests for a few minutes.  She has not had any pain at rest.  She experienced some mild tightness when walking across the parking lot today to the office.  She has never taken nitroglycerin.  She denies shortness of breath, palpitations, lightheadedness, and edema.  She reports tolerating ezetimibe well.  She continues to smoke 5 to 6 cigarettes/day.  --------------------------------------------------------------------------------------------------  Cardiovascular History & Procedures: Cardiovascular Problems:  Coronary artery disease  Risk Factors:  Hypertension, hyperlipidemia, diabetes mellitus, obesity, tobacco use, and age greater than 33; coronary artery and aortic atherosclerotic calcification noted on noncontrast CT of the chest (07/16/2018-personally reviewed)  Cath/PCI:  None  CV Surgery:  None  EP Procedures and Devices:  None  Non-Invasive  Evaluation(s):  TTE (11/06/2018): Normal LV size with moderate LVH. LVEF 55-60% with grade 1 diastolic dysfunction. Mild mitral annular calcification. Normal RV size and function. Unable to assess RVSP.  Cardiac CTA (09/10/18): CAC score 306 (91st percentile). LMCA normal. LAD normal. LCx relatively small with <50% stenosis in mid vessel. Small ramus normal. RCA with <50% stenosis in mid vessel. 51-69% stenosis in distal RCA/rPL (CTFFR of rPL 0.52).   Right lower extremity venous duplex (04/22/2011): No evidence of DVT in the right lower extremity.   Recent CV Pertinent Labs: Lab Results  Component Value Date   K 3.6 01/28/2019   K 3.6 03/15/2014   BUN 19 01/28/2019   BUN 11 03/15/2014   CREATININE 1.10 (H) 01/28/2019   CREATININE 1.01 03/15/2014    Past medical and surgical history were reviewed and updated in EPIC.  Current Meds  Medication Sig  . albuterol (PROVENTIL) (2.5 MG/3ML) 0.083% nebulizer solution TAKE 3 MLS (2.5 MG TOTAL) BY NEBULIZATION EVERY 6 (SIX) HOURS AS NEEDED FOR WHEEZING OR SHORTNESS OF BREATH.  Marland Kitchen albuterol (VENTOLIN HFA) 108 (90 Base) MCG/ACT inhaler USE 2 PUFFS EVERY 4 HOURS AS NEEDED  . amLODipine (NORVASC) 2.5 MG tablet TAKE 1 TABLET (2.5 MG TOTAL) BY MOUTH DAILY.  Marland Kitchen aspirin 81 MG tablet Take 81 mg by mouth daily. 1 tablet daily  . atenolol (TENORMIN) 25 MG tablet Take 1 tablet (25 mg total) by mouth daily.  . budesonide-formoterol (SYMBICORT) 160-4.5 MCG/ACT inhaler INHALE 2 PUFFS INTO THE LUNGS TWO TIMES DAILY  . cetirizine (ZYRTEC) 10 MG tablet Take 10 mg by mouth daily as needed.  . ezetimibe (ZETIA) 10 MG tablet Take 10 mg by mouth daily.   . fluticasone (FLONASE) 50 MCG/ACT nasal spray Place 1 spray into both nostrils daily.  As needed  . gabapentin (NEURONTIN) 100 MG capsule Take 100 mg by mouth as needed.   Marland Kitchen lisinopril-hydrochlorothiazide (ZESTORETIC) 20-12.5 MG tablet TAKE 1 TABLET BY MOUTH DAILY.  . metFORMIN (GLUCOPHAGE) 500 MG tablet  Take 1,000 mg by mouth 2 (two) times daily with a meal. Extended release  . nitroGLYCERIN (NITROSTAT) 0.4 MG SL tablet Place 1 tablet (0.4 mg total) under the tongue every 5 (five) minutes as needed for chest pain. Maximum of 3 doses.  Marland Kitchen omeprazole (PRILOSEC) 20 MG capsule Take 20 mg by mouth as needed.   . Tiotropium Bromide Monohydrate (SPIRIVA RESPIMAT) 1.25 MCG/ACT AERS INHALE 2 PUFFS INTO THE LUNGS DAILY.  Marland Kitchen TRUE METRIX BLOOD GLUCOSE TEST test strip     Allergies: Penicillins  Social History   Tobacco Use  . Smoking status: Current Every Day Smoker    Packs/day: 0.75    Years: 40.00    Pack years: 30.00    Types: Cigarettes    Start date: 12/15/1971  . Smokeless tobacco: Never Used  . Tobacco comment: 02/12/20: smoking 5 ciggaretts daily  Vaping Use  . Vaping Use: Never used  Substance Use Topics  . Alcohol use: No    Alcohol/week: 0.0 standard drinks  . Drug use: No    Family History  Problem Relation Age of Onset  . Hypertension Mother   . Cancer Mother        lung  . Cancer Father        prostate  . Hyperlipidemia Father   . Heart attack Father 53  . Cancer Brother        prostate  . Cirrhosis Sister   . Hepatitis Sister   . Breast cancer Paternal Aunt     Review of Systems: A 12-system review of systems was performed and was negative except as noted in the HPI.  --------------------------------------------------------------------------------------------------  Physical Exam: BP 110/60 (BP Location: Left Arm, Patient Position: Sitting, Cuff Size: Normal)   Pulse 87   Ht 5\' 3"  (1.6 m)   Wt 174 lb (78.9 kg)   SpO2 98%   BMI 30.82 kg/m   General:  NAD. Neck: No JVD or HJR. Lungs: Clear to auscultation bilaterally without wheezes or crackles. Heart: Regular rate and rhythm without murmurs, rubs, or gallops. Abdomen: Soft, nontender, nondistended. Extremities: No lower extremity edema.  2+ radial pulses bilaterally.  EKG: Normal sinus rhythm with  borderline QT prolongation (QTc 474 ms).  No significant change from prior tracing on 05/14/2020.  Lab Results  Component Value Date   WBC 11.6 (H) 11/28/2017   HGB 15.2 11/28/2017   HCT 45.6 11/28/2017   MCV 86.8 11/28/2017   PLT 250 11/28/2017    Lab Results  Component Value Date   NA 132 (L) 01/28/2019   K 3.6 01/28/2019   CL 93 (L) 01/28/2019   CO2 27 01/28/2019   BUN 19 01/28/2019   CREATININE 1.10 (H) 01/28/2019   GLUCOSE 88 01/28/2019   ALT 19 09/24/2016    No results found for: CHOL, HDL, LDLCALC, LDLDIRECT, TRIG, CHOLHDL  --------------------------------------------------------------------------------------------------  ASSESSMENT AND PLAN: Coronary artery disease with accelerating angina: Monica Oliver is a long history of chest pain that have been well controlled with antianginal regimen consisting of amlodipine and atenolol.  She has previously been intolerant of long-acting nitrates.  Coronary CTA in 2019 was notable for significant disease involving distal RCA/RPL branch.  Given that she is experiencing more frequent chest pain with walking consistent with CCS class III angina,  I have recommended that we proceed with cardiac catheterization.  Monica Oliver is somewhat reluctant but ultimately agrees to proceed.  I have reviewed the risks, indications, and alternatives to cardiac catheterization, possible angioplasty, and stenting with the patient. Risks include but are not limited to bleeding, infection, vascular injury, stroke, myocardial infection, arrhythmia, kidney injury, radiation-related injury in the case of prolonged fluoroscopy use, emergency cardiac surgery, and death. The patient understands the risks of serious complication is 1-2 in 4599 with diagnostic cardiac cath and 1-2% or less with angioplasty/stenting.  In the meantime, we will switch from atenolol to metoprolol tartrate 25 mg twice daily.  Low-dose amlodipine will be continued.  I advised Monica Oliver to use  nitroglycerin if she has recurrent chest pain with activity and to seek immediate medical attention if her pain does not resolve promptly.  Hyperlipidemia associated with type 2 diabetes mellitus: Monica Oliver was previously intolerant of statins but is doing well with acetamide.  We will check a lipid panel today to assess response.  Hypertension: Blood pressure well controlled today.  As above, we will transition from atenolol to metoprolol.  Tobacco abuse: Monica Oliver continues to smoke 5 to 6 cigarettes/day: Cessation was encouraged.  She previously had some success with Chantix, though this is currently unavailable.  Follow-up: Return to clinic in 3 to 4 weeks.   [name], MD 12/16/2020 9:25 AM

## 2020-12-16 NOTE — Telephone Encounter (Signed)
Results released to My Chart. No answer. Left message to call back.  Phone note started.

## 2020-12-16 NOTE — Progress Notes (Signed)
Follow-up Outpatient Visit Date: 12/16/2020  Primary Care Provider: Rejeana Brock, MD [address] [place] [address]  Chief Complaint: Chest pain  HPI:  Monica Oliver is a 70 y.o. female with history of coronary artery disease with stable angina, hypertension, hyperlipidemia, type 2 diabetes mellitus, COPD, obstructive sleep apnea, shingles, and tobacco use, who presents for follow-up of coronary artery disease.  I last saw her in 04/2020, at which time she reported stable exertional dyspnea when walking extended dyspnea.  She was in the process of trying to quit smoking.  She reported improvement in myalgias after switching from simvastatin to ezetimibe.  We did not make any medication changes or pursue additional testing.  Today, Monica Oliver reports that she has been experiencing increasing chest pain since our last visit.  When she walks uphill or walks extended distances, she will experience a brief sharp pain in her left chest and only lasts a second followed by tightness.  This gradually resolves after she rests for a few minutes.  She has not had any pain at rest.  She experienced some mild tightness when walking across the parking lot today to the office.  She has never taken nitroglycerin.  She denies shortness of breath, palpitations, lightheadedness, and edema.  She reports tolerating ezetimibe well.  She continues to smoke 5 to 6 cigarettes/day.  --------------------------------------------------------------------------------------------------  Cardiovascular History & Procedures: Cardiovascular Problems:  Coronary artery disease  Risk Factors:  Hypertension, hyperlipidemia, diabetes mellitus, obesity, tobacco use, and age greater than 49; coronary artery and aortic atherosclerotic calcification noted on noncontrast CT of the chest (07/16/2018-personally reviewed)  Cath/PCI:  None  CV Surgery:  None  EP Procedures and Devices:  None  Non-Invasive  Evaluation(s):  TTE (11/06/2018): Normal LV size with moderate LVH. LVEF 55-60% with grade 1 diastolic dysfunction. Mild mitral annular calcification. Normal RV size and function. Unable to assess RVSP.  Cardiac CTA (09/10/18): CAC score 306 (91st percentile). LMCA normal. LAD normal. LCx relatively small with <50% stenosis in mid vessel. Small ramus normal. RCA with <50% stenosis in mid vessel. 51-69% stenosis in distal RCA/rPL (CTFFR of rPL 0.52).   Right lower extremity venous duplex (04/22/2011): No evidence of DVT in the right lower extremity.   Recent CV Pertinent Labs: Lab Results  Component Value Date   K 3.6 01/28/2019   K 3.6 03/15/2014   BUN 19 01/28/2019   BUN 11 03/15/2014   CREATININE 1.10 (H) 01/28/2019   CREATININE 1.01 03/15/2014    Past medical and surgical history were reviewed and updated in EPIC.  Current Meds  Medication Sig  . albuterol (PROVENTIL) (2.5 MG/3ML) 0.083% nebulizer solution TAKE 3 MLS (2.5 MG TOTAL) BY NEBULIZATION EVERY 6 (SIX) HOURS AS NEEDED FOR WHEEZING OR SHORTNESS OF BREATH.  Marland Kitchen albuterol (VENTOLIN HFA) 108 (90 Base) MCG/ACT inhaler USE 2 PUFFS EVERY 4 HOURS AS NEEDED  . amLODipine (NORVASC) 2.5 MG tablet TAKE 1 TABLET (2.5 MG TOTAL) BY MOUTH DAILY.  Marland Kitchen aspirin 81 MG tablet Take 81 mg by mouth daily. 1 tablet daily  . atenolol (TENORMIN) 25 MG tablet Take 1 tablet (25 mg total) by mouth daily.  . budesonide-formoterol (SYMBICORT) 160-4.5 MCG/ACT inhaler INHALE 2 PUFFS INTO THE LUNGS TWO TIMES DAILY  . cetirizine (ZYRTEC) 10 MG tablet Take 10 mg by mouth daily as needed.  . ezetimibe (ZETIA) 10 MG tablet Take 10 mg by mouth daily.   . fluticasone (FLONASE) 50 MCG/ACT nasal spray Place 1 spray into both nostrils daily.  As needed  . gabapentin (NEURONTIN) 100 MG capsule Take 100 mg by mouth as needed.   Marland Kitchen lisinopril-hydrochlorothiazide (ZESTORETIC) 20-12.5 MG tablet TAKE 1 TABLET BY MOUTH DAILY.  . metFORMIN (GLUCOPHAGE) 500 MG tablet  Take 1,000 mg by mouth 2 (two) times daily with a meal. Extended release  . nitroGLYCERIN (NITROSTAT) 0.4 MG SL tablet Place 1 tablet (0.4 mg total) under the tongue every 5 (five) minutes as needed for chest pain. Maximum of 3 doses.  Marland Kitchen omeprazole (PRILOSEC) 20 MG capsule Take 20 mg by mouth as needed.   . Tiotropium Bromide Monohydrate (SPIRIVA RESPIMAT) 1.25 MCG/ACT AERS INHALE 2 PUFFS INTO THE LUNGS DAILY.  Marland Kitchen TRUE METRIX BLOOD GLUCOSE TEST test strip     Allergies: Penicillins  Social History   Tobacco Use  . Smoking status: Current Every Day Smoker    Packs/day: 0.75    Years: 40.00    Pack years: 30.00    Types: Cigarettes    Start date: 12/15/1971  . Smokeless tobacco: Never Used  . Tobacco comment: 02/12/20: smoking 5 ciggaretts daily  Vaping Use  . Vaping Use: Never used  Substance Use Topics  . Alcohol use: No    Alcohol/week: 0.0 standard drinks  . Drug use: No    Family History  Problem Relation Age of Onset  . Hypertension Mother   . Cancer Mother        lung  . Cancer Father        prostate  . Hyperlipidemia Father   . Heart attack Father 30  . Cancer Brother        prostate  . Cirrhosis Sister   . Hepatitis Sister   . Breast cancer Paternal Aunt     Review of Systems: A 12-system review of systems was performed and was negative except as noted in the HPI.  --------------------------------------------------------------------------------------------------  Physical Exam: BP 110/60 (BP Location: Left Arm, Patient Position: Sitting, Cuff Size: Normal)   Pulse 87   Ht 5\' 3"  (1.6 m)   Wt 174 lb (78.9 kg)   SpO2 98%   BMI 30.82 kg/m   General:  NAD. Neck: No JVD or HJR. Lungs: Clear to auscultation bilaterally without wheezes or crackles. Heart: Regular rate and rhythm without murmurs, rubs, or gallops. Abdomen: Soft, nontender, nondistended. Extremities: No lower extremity edema.  2+ radial pulses bilaterally.  EKG: Normal sinus rhythm with  borderline QT prolongation (QTc 474 ms).  No significant change from prior tracing on 05/14/2020.  Lab Results  Component Value Date   WBC 11.6 (H) 11/28/2017   HGB 15.2 11/28/2017   HCT 45.6 11/28/2017   MCV 86.8 11/28/2017   PLT 250 11/28/2017    Lab Results  Component Value Date   NA 132 (L) 01/28/2019   K 3.6 01/28/2019   CL 93 (L) 01/28/2019   CO2 27 01/28/2019   BUN 19 01/28/2019   CREATININE 1.10 (H) 01/28/2019   GLUCOSE 88 01/28/2019   ALT 19 09/24/2016    No results found for: CHOL, HDL, LDLCALC, LDLDIRECT, TRIG, CHOLHDL  --------------------------------------------------------------------------------------------------  ASSESSMENT AND PLAN: Coronary artery disease with accelerating angina: Monica Oliver is a long history of chest pain that have been well controlled with antianginal regimen consisting of amlodipine and atenolol.  She has previously been intolerant of long-acting nitrates.  Coronary CTA in 2019 was notable for significant disease involving distal RCA/RPL branch.  Given that she is experiencing more frequent chest pain with walking consistent with CCS class III angina,  I have recommended that we proceed with cardiac catheterization.  Monica Oliver is somewhat reluctant but ultimately agrees to proceed.  I have reviewed the risks, indications, and alternatives to cardiac catheterization, possible angioplasty, and stenting with the patient. Risks include but are not limited to bleeding, infection, vascular injury, stroke, myocardial infection, arrhythmia, kidney injury, radiation-related injury in the case of prolonged fluoroscopy use, emergency cardiac surgery, and death. The patient understands the risks of serious complication is 1-2 in 5883 with diagnostic cardiac cath and 1-2% or less with angioplasty/stenting.  In the meantime, we will switch from atenolol to metoprolol tartrate 25 mg twice daily.  Low-dose amlodipine will be continued.  I advised Monica Oliver to use  nitroglycerin if she has recurrent chest pain with activity and to seek immediate medical attention if her pain does not resolve promptly.  Hyperlipidemia associated with type 2 diabetes mellitus: Monica Oliver was previously intolerant of statins but is doing well with acetamide.  We will check a lipid panel today to assess response.  Hypertension: Blood pressure well controlled today.  As above, we will transition from atenolol to metoprolol.  Tobacco abuse: Monica Oliver continues to smoke 5 to 6 cigarettes/day: Cessation was encouraged.  She previously had some success with Chantix, though this is currently unavailable.  Follow-up: Return to clinic in 3 to 4 weeks.   [name], MD 12/16/2020 9:25 AM

## 2020-12-16 NOTE — Telephone Encounter (Signed)
Patient calling back. She verbalized understanding of the results. Her PCP is [facility]. She will plan to call them to get appointment as soon as possible. I will call PCP tomorrow (as it is after 5 pm now) to request recent lab work as she said she has been getting lab work there.

## 2020-12-17 ENCOUNTER — Encounter: Payer: Self-pay | Admitting: Internal Medicine

## 2020-12-17 ENCOUNTER — Other Ambulatory Visit (HOSPITAL_COMMUNITY): Payer: Self-pay | Admitting: Family Medicine

## 2020-12-17 DIAGNOSIS — Z130 Encounter for screening for diseases of the blood and blood-forming organs and certain disorders involving the immune mechanism: Secondary | ICD-10-CM | POA: Diagnosis not present

## 2020-12-17 DIAGNOSIS — Z0000 Encounter for general adult medical examination without abnormal findings: Secondary | ICD-10-CM | POA: Diagnosis not present

## 2020-12-17 DIAGNOSIS — E119 Type 2 diabetes mellitus without complications: Secondary | ICD-10-CM | POA: Diagnosis not present

## 2020-12-17 DIAGNOSIS — I10 Essential (primary) hypertension: Secondary | ICD-10-CM | POA: Diagnosis not present

## 2020-12-17 DIAGNOSIS — E538 Deficiency of other specified B group vitamins: Secondary | ICD-10-CM | POA: Diagnosis not present

## 2020-12-17 DIAGNOSIS — D509 Iron deficiency anemia, unspecified: Secondary | ICD-10-CM | POA: Diagnosis not present

## 2020-12-17 NOTE — Telephone Encounter (Signed)
Received labs from [facility]. There was not any recent CBC.  Called them back and no CBC performed since 2018. Patient has appointment this afternoon with PCP. I faxed over patient's labs and office visit note from yesterday with Dr End.

## 2020-12-17 NOTE — Telephone Encounter (Signed)
[place]. Spoke to representative and asked to have them fax patient's most recent lab work to [place] for review.   Awaiting lab results.

## 2020-12-17 NOTE — Telephone Encounter (Signed)
Patient calling in after visit yesterday and concerning Iron level lab results. Patient stated again that she has not noticed any blood loss and seems confused as to why her iron levels would be low. Patient does have an appointment today at 2:45 with her PCP's office. Patient did state she is a little confused why she needs to be seen there for more labs when she had them preformed yesterday. Patient was advised the office is waiting for her [place] labs to be faxed over for comparison  Please advise if needed

## 2020-12-22 ENCOUNTER — Telehealth: Payer: Self-pay | Admitting: Internal Medicine

## 2020-12-22 DIAGNOSIS — D509 Iron deficiency anemia, unspecified: Secondary | ICD-10-CM

## 2020-12-22 NOTE — Telephone Encounter (Signed)
Labs reviewed and consistent with iron-deficiency anemia.  She should be seen by GI as soon as possible.  Given progressive chest pain discussed at our recent visit, I recommend repeating a CBC on Friday.  If it is stable, I think it is reasonable to proceed with catheterization as scheduled to exclude a high risk lesion, as I suspect GI would be reluctant to proceed with endoscopy in the setting of accelerating angina.  As long as there is no high-risk stenosis, we will defer PCI pending completion of GI workup.Labs reviewed and consistent with iron-deficiency anemia.  She should be seen by GI as soon as possible.  Given progressive chest pain discussed at our recent visit, I recommend repeating a CBC on Friday.  If it is stable, I think it is reasonable to proceed with catheterization as scheduled to exclude a high risk lesion, as I suspect GI would be reluctant to proceed with endoscopy in the setting of accelerating angina.  As long as there is no high-risk stenosis, we will defer PCI pending completion of GI workup.  [name], MD [facility]

## 2020-12-22 NOTE — Telephone Encounter (Signed)
Did Monica Oliver wind up seeing her PCP, and if so, is there a plan in place for further workup of her anemia? It would be good to make sure that her hemoglobin has not continued to drop and that potential sources of her bleeding have been explored. Thanks.  Gerald Stabs

## 2020-12-22 NOTE — Telephone Encounter (Signed)
See tele encounter from 12/22/20 for continuation.

## 2020-12-22 NOTE — Telephone Encounter (Signed)
Attempted to call patient back and left a VM for patient to call back.

## 2020-12-22 NOTE — Telephone Encounter (Signed)
Patient returning call.

## 2020-12-22 NOTE — Telephone Encounter (Signed)
Patient calling to check the status of procedure

## 2020-12-22 NOTE — Telephone Encounter (Signed)
Found labs in Monica Oliver pile of faxes. Having them scanned in now. Patient was seen by PCP on 12/22/20. Patient was referred to GI for endoscopy and colonoscopy given her anemia and h/o GERD.   Will re-route to Dr. Saunders Revel for review of lab work and PCP plan, and consideration of upcoming scheduled cath.

## 2020-12-22 NOTE — Telephone Encounter (Signed)
Called patient back and left a VM for her to call back.

## 2020-12-22 NOTE — Telephone Encounter (Signed)
Lm for reminder of covid test prior to PFT  12/25/2020 at 11:[facility]

## 2020-12-23 ENCOUNTER — Telehealth: Payer: Self-pay | Admitting: *Deleted

## 2020-12-23 ENCOUNTER — Encounter: Payer: Self-pay | Admitting: *Deleted

## 2020-12-23 NOTE — Addendum Note (Signed)
Addended by: Valora Corporal on: 12/23/2020 11:00 AM   Modules accepted: Orders

## 2020-12-23 NOTE — Telephone Encounter (Signed)
Reviewed results and recommendations with patient to include repeat labs on Friday. She verbalized understanding of our conversation, agreeable with plan, and had no further questions at this time.

## 2020-12-23 NOTE — Telephone Encounter (Signed)
Left voicemail message to call back for review of results and recommendations.  

## 2020-12-23 NOTE — Telephone Encounter (Signed)
-----   Message from [name], MD sent at 12/22/2020  8:09 PM EDT ----- Labs reviewed and consistent with iron-deficiency anemia.  She should be seen by GI as soon as possible.  Given progressive chest pain discussed at our recent visit, I recommend repeating a CBC on Friday.  If it is stable, I think it is reasonable to proceed with catheterization as scheduled to exclude a high risk lesion, as I suspect GI would be reluctant to proceed with endoscopy in the setting of accelerating angina.  As long as there is no high-risk stenosis, we will defer PCI pending completion of GI workup.

## 2020-12-23 NOTE — Telephone Encounter (Signed)
Lm x2 for aptient.  Will close encounter per office protocol.

## 2020-12-23 NOTE — Telephone Encounter (Signed)
See other telephone encounter. Results and recommendations reviewed with patient.

## 2020-12-25 ENCOUNTER — Other Ambulatory Visit: Payer: Self-pay | Admitting: Internal Medicine

## 2020-12-25 ENCOUNTER — Telehealth: Payer: Self-pay | Admitting: Internal Medicine

## 2020-12-25 ENCOUNTER — Other Ambulatory Visit
Admission: RE | Admit: 2020-12-25 | Discharge: 2020-12-25 | Disposition: A | Discharge status: Home / Self Care | Payer: 59 | Source: Ambulatory Visit | Attending: Internal Medicine | Admitting: Internal Medicine

## 2020-12-25 ENCOUNTER — Other Ambulatory Visit: Payer: Self-pay

## 2020-12-25 ENCOUNTER — Other Ambulatory Visit
Admission: RE | Admit: 2020-12-25 | Discharge: 2020-12-25 | Disposition: A | Discharge status: Home / Self Care | Payer: 59 | Source: Home / Self Care | Attending: Internal Medicine | Admitting: Internal Medicine

## 2020-12-25 DIAGNOSIS — Z20822 Contact with and (suspected) exposure to covid-19: Secondary | ICD-10-CM | POA: Insufficient documentation

## 2020-12-25 DIAGNOSIS — Z0279 Encounter for issue of other medical certificate: Secondary | ICD-10-CM

## 2020-12-25 DIAGNOSIS — D509 Iron deficiency anemia, unspecified: Secondary | ICD-10-CM | POA: Insufficient documentation

## 2020-12-25 DIAGNOSIS — Z01812 Encounter for preprocedural laboratory examination: Secondary | ICD-10-CM | POA: Diagnosis not present

## 2020-12-25 LAB — CBC
HCT: 31.9 % — ABNORMAL LOW (ref 36.0–46.0)
Hemoglobin: 9.7 g/dL — ABNORMAL LOW (ref 12.0–15.0)
MCH: 23.5 pg — ABNORMAL LOW (ref 26.0–34.0)
MCHC: 30.4 g/dL (ref 30.0–36.0)
MCV: 77.2 fL — ABNORMAL LOW (ref 80.0–100.0)
Platelets: 485 10*3/uL — ABNORMAL HIGH (ref 150–400)
RBC: 4.13 MIL/uL (ref 3.87–5.11)
RDW: 17.5 % — ABNORMAL HIGH (ref 11.5–15.5)
WBC: 9.6 10*3/uL (ref 4.0–10.5)
nRBC: 0 % (ref 0.0–0.2)

## 2020-12-25 NOTE — Telephone Encounter (Signed)
Received forms from [employer]  Patient came and signed forms and paid $29 cash Placed in nurse box for completion

## 2020-12-26 LAB — SARS CORONAVIRUS 2 (TAT 6-24 HRS): SARS Coronavirus 2: NEGATIVE

## 2020-12-28 ENCOUNTER — Other Ambulatory Visit: Payer: Self-pay

## 2020-12-28 ENCOUNTER — Ambulatory Visit: Payer: 59 | Attending: Internal Medicine

## 2020-12-28 DIAGNOSIS — J449 Chronic obstructive pulmonary disease, unspecified: Secondary | ICD-10-CM | POA: Diagnosis present

## 2020-12-28 DIAGNOSIS — R0602 Shortness of breath: Secondary | ICD-10-CM | POA: Insufficient documentation

## 2020-12-29 ENCOUNTER — Other Ambulatory Visit: Payer: Self-pay

## 2020-12-29 ENCOUNTER — Encounter
Admission: RE | Disposition: A | Discharge status: Home / Self Care | Payer: Self-pay | Source: Home / Self Care | Attending: Internal Medicine

## 2020-12-29 ENCOUNTER — Ambulatory Visit
Admission: RE | Admit: 2020-12-29 | Discharge: 2020-12-29 | Disposition: A | Discharge status: Home / Self Care | Payer: 59 | Attending: Internal Medicine | Admitting: Internal Medicine

## 2020-12-29 ENCOUNTER — Encounter: Payer: Self-pay | Admitting: Internal Medicine

## 2020-12-29 DIAGNOSIS — I200 Unstable angina: Secondary | ICD-10-CM

## 2020-12-29 DIAGNOSIS — I25118 Atherosclerotic heart disease of native coronary artery with other forms of angina pectoris: Secondary | ICD-10-CM | POA: Diagnosis not present

## 2020-12-29 DIAGNOSIS — D509 Iron deficiency anemia, unspecified: Secondary | ICD-10-CM | POA: Diagnosis not present

## 2020-12-29 DIAGNOSIS — Z79899 Other long term (current) drug therapy: Secondary | ICD-10-CM | POA: Insufficient documentation

## 2020-12-29 DIAGNOSIS — E785 Hyperlipidemia, unspecified: Secondary | ICD-10-CM | POA: Insufficient documentation

## 2020-12-29 DIAGNOSIS — J449 Chronic obstructive pulmonary disease, unspecified: Secondary | ICD-10-CM | POA: Diagnosis not present

## 2020-12-29 DIAGNOSIS — I10 Essential (primary) hypertension: Secondary | ICD-10-CM | POA: Diagnosis not present

## 2020-12-29 DIAGNOSIS — Z7984 Long term (current) use of oral hypoglycemic drugs: Secondary | ICD-10-CM | POA: Diagnosis not present

## 2020-12-29 DIAGNOSIS — Z7982 Long term (current) use of aspirin: Secondary | ICD-10-CM | POA: Insufficient documentation

## 2020-12-29 DIAGNOSIS — G4733 Obstructive sleep apnea (adult) (pediatric): Secondary | ICD-10-CM | POA: Insufficient documentation

## 2020-12-29 DIAGNOSIS — E1169 Type 2 diabetes mellitus with other specified complication: Secondary | ICD-10-CM | POA: Insufficient documentation

## 2020-12-29 DIAGNOSIS — Z880 Allergy status to penicillin: Secondary | ICD-10-CM | POA: Insufficient documentation

## 2020-12-29 DIAGNOSIS — F17210 Nicotine dependence, cigarettes, uncomplicated: Secondary | ICD-10-CM | POA: Insufficient documentation

## 2020-12-29 DIAGNOSIS — Z7951 Long term (current) use of inhaled steroids: Secondary | ICD-10-CM | POA: Insufficient documentation

## 2020-12-29 DIAGNOSIS — I25110 Atherosclerotic heart disease of native coronary artery with unstable angina pectoris: Secondary | ICD-10-CM

## 2020-12-29 HISTORY — PX: LEFT HEART CATH AND CORONARY ANGIOGRAPHY: CATH118249

## 2020-12-29 LAB — GLUCOSE, CAPILLARY
Glucose-Capillary: 107 mg/dL — ABNORMAL HIGH (ref 70–99)
Glucose-Capillary: 93 mg/dL (ref 70–99)

## 2020-12-29 SURGERY — LEFT HEART CATH AND CORONARY ANGIOGRAPHY
Anesthesia: Moderate Sedation | Laterality: Left

## 2020-12-29 MED ORDER — VERAPAMIL HCL 2.5 MG/ML IV SOLN
INTRAVENOUS | Status: DC | PRN
Start: 2020-12-29 — End: 2020-12-29
  Administered 2020-12-29: 2.5 mg via INTRA_ARTERIAL

## 2020-12-29 MED ORDER — LIDOCAINE HCL (PF) 1 % IJ SOLN
INTRAMUSCULAR | Status: DC | PRN
  Administered 2020-12-29: 2 mL

## 2020-12-29 MED ORDER — SODIUM CHLORIDE 0.9 % WEIGHT BASED INFUSION: 1.0000 mL/kg/h | INTRAVENOUS | Status: DC

## 2020-12-29 MED ORDER — HEPARIN (PORCINE) IN NACL 2000-0.9 UNIT/L-% IV SOLN
INTRAVENOUS | Status: DC | PRN
  Administered 2020-12-29: 1000 mL

## 2020-12-29 MED ORDER — ACETAMINOPHEN 325 MG PO TABS: 650.0000 mg | ORAL_TABLET | ORAL | Status: DC | PRN

## 2020-12-29 MED ORDER — SODIUM CHLORIDE 0.9% FLUSH: 3.0000 mL | Freq: Two times a day (BID) | INTRAVENOUS | Status: DC

## 2020-12-29 MED ORDER — VERAPAMIL HCL 2.5 MG/ML IV SOLN
INTRAVENOUS | Status: AC
  Filled 2020-12-29: qty 2

## 2020-12-29 MED ORDER — LIDOCAINE HCL (PF) 1 % IJ SOLN: INTRAMUSCULAR | Status: DC | PRN

## 2020-12-29 MED ORDER — HYDRALAZINE HCL 20 MG/ML IJ SOLN: 10.0000 mg | INTRAMUSCULAR | Status: DC | PRN

## 2020-12-29 MED ORDER — METOPROLOL TARTRATE 25 MG PO TABS: 50.0000 mg | ORAL_TABLET | Freq: Two times a day (BID) | ORAL | 2 refills | Status: DC

## 2020-12-29 MED ORDER — HEPARIN (PORCINE) IN NACL 1000-0.9 UT/500ML-% IV SOLN
INTRAVENOUS | Status: AC
  Filled 2020-12-29: qty 1000

## 2020-12-29 MED ORDER — SODIUM CHLORIDE 0.9 % IV SOLN: 250.0000 mL | INTRAVENOUS | Status: DC | PRN

## 2020-12-29 MED ORDER — SODIUM CHLORIDE 0.9 % IV SOLN: INTRAVENOUS | Status: DC

## 2020-12-29 MED ORDER — LABETALOL HCL 5 MG/ML IV SOLN: 10.0000 mg | INTRAVENOUS | Status: DC | PRN

## 2020-12-29 MED ORDER — FENTANYL CITRATE (PF) 100 MCG/2ML IJ SOLN
INTRAMUSCULAR | Status: DC | PRN
  Administered 2020-12-29 (×2): 25 ug via INTRAVENOUS

## 2020-12-29 MED ORDER — HEPARIN SODIUM (PORCINE) 1000 UNIT/ML IJ SOLN
INTRAMUSCULAR | Status: AC
  Filled 2020-12-29: qty 1

## 2020-12-29 MED ORDER — SODIUM CHLORIDE 0.9% FLUSH: 3.0000 mL | INTRAVENOUS | Status: DC | PRN

## 2020-12-29 MED ORDER — MIDAZOLAM HCL 2 MG/2ML IJ SOLN
INTRAMUSCULAR | Status: DC | PRN
  Administered 2020-12-29 (×2): 1 mg via INTRAVENOUS

## 2020-12-29 MED ORDER — ASPIRIN 81 MG PO CHEW: 81.0000 mg | CHEWABLE_TABLET | ORAL | Status: DC

## 2020-12-29 MED ORDER — FENTANYL CITRATE (PF) 100 MCG/2ML IJ SOLN
INTRAMUSCULAR | Status: AC
  Filled 2020-12-29: qty 2

## 2020-12-29 MED ORDER — MIDAZOLAM HCL 2 MG/2ML IJ SOLN
INTRAMUSCULAR | Status: AC
  Filled 2020-12-29: qty 2

## 2020-12-29 MED ORDER — SODIUM CHLORIDE 0.9 % WEIGHT BASED INFUSION
3.0000 mL/kg/h | INTRAVENOUS | Status: AC
  Administered 2020-12-29: 3 mL/kg/h via INTRAVENOUS

## 2020-12-29 MED ORDER — IOHEXOL 300 MG/ML  SOLN
INTRAMUSCULAR | Status: DC | PRN
  Administered 2020-12-29: 47 mL

## 2020-12-29 MED ORDER — LIDOCAINE HCL (PF) 1 % IJ SOLN
INTRAMUSCULAR | Status: AC
  Filled 2020-12-29: qty 30

## 2020-12-29 MED ORDER — HEPARIN SODIUM (PORCINE) 1000 UNIT/ML IJ SOLN
INTRAMUSCULAR | Status: DC | PRN
  Administered 2020-12-29: 3500 [IU] via INTRAVENOUS

## 2020-12-29 SURGICAL SUPPLY — 12 items
CATH INFINITI 5FR JL4 (CATHETERS) ×2 IMPLANT
CATH INFINITI JR4 5F (CATHETERS) ×2 IMPLANT
CATH LAUNCHER 5F EBU3.0 (CATHETERS) ×1 IMPLANT
CATHETER LAUNCHER 5F EBU3.0 (CATHETERS) ×2
COVER EZ STRL 42X30 (DRAPES) ×2 IMPLANT
DEVICE RAD TR BAND REGULAR (VASCULAR PRODUCTS) ×2 IMPLANT
DRAPE BRACHIAL (DRAPES) ×2 IMPLANT
GLIDESHEATH SLEND SS 6F .021 (SHEATH) ×2 IMPLANT
GUIDEWIRE INQWIRE 1.5J.035X260 (WIRE) ×1 IMPLANT
INQWIRE 1.5J .035X260CM (WIRE) ×2
PACK CARDIAC CATH (CUSTOM PROCEDURE TRAY) ×2 IMPLANT
SET ATX SIMPLICITY (MISCELLANEOUS) ×2 IMPLANT

## 2020-12-29 NOTE — Interval H&P Note (Signed)
History and Physical Interval Note:  12/29/2020 11:56 AM  Monica Oliver  has presented today for surgery, with the diagnosis of accelerating angina.  The various methods of treatment have been discussed with the patient and family. After consideration of risks, benefits and other options for treatment, the patient has consented to  Procedure(s): LEFT HEART CATH AND CORONARY ANGIOGRAPHY (Left) as a surgical intervention.  The patient's history has been reviewed, patient examined, no change in status, stable for surgery.  I have reviewed the patient's chart and labs.  Questions were answered to the patient's satisfaction.   Cath Lab Visit (complete for each Cath Lab visit)  Clinical Evaluation Leading to the Procedure:   ACS: No.  Non-ACS:    Anginal Classification: CCS III  Anti-ischemic medical therapy: Maximal Therapy (2 or more classes of medications)  Non-Invasive Test Results: Intermediate-risk stress test findings: cardiac mortality 1-3%/year (single-vessel CAD by cardiac CTA in 2019)  Prior CABG: No previous CABG  Monica Oliver

## 2020-12-29 NOTE — Telephone Encounter (Signed)
Forms placed on Dr. Darnelle Bos desk for review and completion.

## 2020-12-30 ENCOUNTER — Ambulatory Visit: Payer: 59 | Admitting: Internal Medicine

## 2020-12-30 NOTE — Telephone Encounter (Signed)
Paperwork completed and returned to front office.  [name], MD [facility]

## 2020-12-30 NOTE — Telephone Encounter (Signed)
Completed forms scanned in and faxed to company

## 2020-12-31 ENCOUNTER — Encounter: Payer: Self-pay | Admitting: Internal Medicine

## 2021-01-07 ENCOUNTER — Other Ambulatory Visit: Payer: Self-pay | Admitting: Internal Medicine

## 2021-01-07 ENCOUNTER — Other Ambulatory Visit: Payer: Self-pay

## 2021-01-08 ENCOUNTER — Other Ambulatory Visit: Payer: Self-pay

## 2021-01-08 DIAGNOSIS — M545 Low back pain, unspecified: Secondary | ICD-10-CM | POA: Diagnosis not present

## 2021-01-11 ENCOUNTER — Other Ambulatory Visit: Payer: Self-pay

## 2021-01-11 MED FILL — Metoprolol Tartrate Tab 25 MG: ORAL | 30 days supply | Qty: 60 | Fill #0 | Status: AC

## 2021-01-12 DIAGNOSIS — Z130 Encounter for screening for diseases of the blood and blood-forming organs and certain disorders involving the immune mechanism: Secondary | ICD-10-CM | POA: Diagnosis not present

## 2021-01-12 DIAGNOSIS — I2510 Atherosclerotic heart disease of native coronary artery without angina pectoris: Secondary | ICD-10-CM | POA: Diagnosis not present

## 2021-01-12 DIAGNOSIS — F17210 Nicotine dependence, cigarettes, uncomplicated: Secondary | ICD-10-CM | POA: Diagnosis not present

## 2021-01-12 DIAGNOSIS — R590 Localized enlarged lymph nodes: Secondary | ICD-10-CM | POA: Diagnosis not present

## 2021-01-12 DIAGNOSIS — E119 Type 2 diabetes mellitus without complications: Secondary | ICD-10-CM | POA: Diagnosis not present

## 2021-01-12 DIAGNOSIS — I10 Essential (primary) hypertension: Secondary | ICD-10-CM | POA: Diagnosis not present

## 2021-01-12 DIAGNOSIS — D509 Iron deficiency anemia, unspecified: Secondary | ICD-10-CM | POA: Diagnosis not present

## 2021-01-13 ENCOUNTER — Encounter: Payer: Self-pay | Admitting: Nurse Practitioner

## 2021-01-13 ENCOUNTER — Other Ambulatory Visit: Payer: Self-pay

## 2021-01-13 ENCOUNTER — Ambulatory Visit: Payer: 59 | Admitting: Nurse Practitioner

## 2021-01-13 VITALS — BP 100/64 | HR 88 | Ht 63.0 in | Wt 175.0 lb

## 2021-01-13 DIAGNOSIS — I10 Essential (primary) hypertension: Secondary | ICD-10-CM

## 2021-01-13 DIAGNOSIS — E782 Mixed hyperlipidemia: Secondary | ICD-10-CM

## 2021-01-13 DIAGNOSIS — Z720 Tobacco use: Secondary | ICD-10-CM

## 2021-01-13 DIAGNOSIS — I25118 Atherosclerotic heart disease of native coronary artery with other forms of angina pectoris: Secondary | ICD-10-CM | POA: Diagnosis not present

## 2021-01-13 NOTE — Progress Notes (Signed)
Office Visit    Patient Name: Monica Oliver Date of Encounter: 01/13/2021  Primary Care Provider:  Rejeana Brock, MD Primary Cardiologist:  [name], MD  Chief Complaint    70 year old female with a history of coronary artery disease, stable angina, hypertension, hyperlipidemia, type 2 diabetes mellitus, tobacco abuse, COPD, sleep apnea, GERD, and shingles, presents for follow-up after recent diagnostic catheterization.  Past Medical History    Past Medical History:  Diagnosis Date  . Allergy    environmental  . Arthritis   . Asthma   . CAD (coronary artery disease)    a. 08/2018 Cardiac CTA: CAC score 306 (91st %'ile). LM nl, LAD nl, LCX small w/ <50% mid stenosis. RI nl, RCA <50% mid, 51-69% distal (CTFFR of rPL 0.52); b. 12/2020 Cath: LM nl, LAD nl, D2 20, LCX nl, RCA 20p/m, RPAV 85. EF 55-65%-->Med Rx.  . Claudication ([place])    a. 08/2019 ABI's nl.  . COPD (chronic obstructive pulmonary disease) ([place])   . Diabetes mellitus without complication ([place])   . Diastolic dysfunction    a. 10/2018 Echo: EF 55-60%, Gr1 DD. Nl RV size/fxn.  Marland Kitchen GERD (gastroesophageal reflux disease)   . Hyperlipidemia   . Hypertension   . Sleep apnea    C-Pap machine  . Tobacco abuse    Past Surgical History:  Procedure Laterality Date  . CESAREAN SECTION     1986  . CHOLECYSTECTOMY     1993  . LEFT HEART CATH AND CORONARY ANGIOGRAPHY Left 12/29/2020   Procedure: LEFT HEART CATH AND CORONARY ANGIOGRAPHY;  Surgeon: [name], MD;  Location: [facility];  Service: Cardiovascular;  Laterality: Left;    Allergies  Allergies  Allergen Reactions  . Statins   . Penicillins Other (See Comments)    Unsure- she's had it for so long    History of Present Illness    70 year old female with the above past medical history including CAD, stable angina, hypertension, hyperlipidemia, type 2 diabetes mellitus, ongoing tobacco abuse, COPD, sleep apnea, GERD, and shingles.  She  previously underwent work-up for exertional chest pain in December 2019 with coronary CTA revealing predominantly nonobstructive CAD with abnormal CT FFR in the RPL.  She was treated with oral nitrates but this was discontinued in the setting of achiness and generalized fatigue.  Subsequent follow-up visits, she continued to report stable, exertional dyspnea when walking for extended periods.  At follow-up on March 23, she reported increasing episodes of exertional sharp chest pain and tightness that resolved with rest after a few minutes.  Decision was made to pursue diagnostic catheterization.  Preprocedure labs were notable for microcytic anemia with an H&H of 9.7 and 31.6.  She underwent diagnostic catheterization in early April revealing nonobstructive CAD with a 20% stenosis in the second diagonal, 20% proximal/mid stenosis in the RCA, and 85% stenosis in the RPAV.  Continue medical therapy was recommended including work-up of microcytic anemia.  If after work-up and correction of anemia, she continued to have angina, PCI of the RPLV could be considered.  She has since followed up with her primary care provider and in the setting of ongoing iron supplementation, she reports that her hemoglobin was recently checked and returned at 31.  She has been feeling well since her diagnostic catheterization.  She has not experienced any chest pain or dyspnea.  Her right wrist has been healing well.  She denies palpitations, PND, orthopnea, dizziness, syncope, edema, or early satiety.  She  is scheduled to see GI in early May.  She is still smoking but says she is cut down to 4 cigarettes/day.  Home Medications    Prior to Admission medications   Medication Sig Start Date End Date Taking? Authorizing Provider  albuterol (PROVENTIL) (2.5 MG/3ML) 0.083% nebulizer solution TAKE 3 MLS (2.5 MG TOTAL) BY NEBULIZATION EVERY 6 HOURS AS NEEDED FOR WHEEZING OR SHORTNESS OF BREATH. 12/25/20  Yes Kasa, Kurian, MD  albuterol  (VENTOLIN HFA) 108 (90 Base) MCG/ACT inhaler INHALE 2 PUFFS BY MOUTH EVERY 4 HOURS AS NEEDED 08/14/20 08/14/21 Yes Kasa, Kurian, MD  amLODipine (NORVASC) 2.5 MG tablet TAKE 1 TABLET BY MOUTH DAILY. 07/14/20 07/14/21 Yes End, Harrell Gave, MD  aspirin 81 MG tablet Take 81 mg by mouth daily.   Yes [provider]  budesonide-formoterol (SYMBICORT) 160-4.5 MCG/ACT inhaler INHALE 2 PUFFS BY MOUTH TWO TIMES DAILY 02/12/20 02/11/21 Yes Magdalen Spatz, NP  celecoxib (CELEBREX) 200 MG capsule TAKE 1 CAPSULE BY MOUTH ONCE DAILY 04/09/20 04/09/21 Yes Dimmig, Marcello Moores, MD  cetirizine (ZYRTEC) 10 MG tablet Take 10 mg by mouth daily as needed for allergies.   Yes [provider]  cholecalciferol (VITAMIN D3) 25 MCG (1000 UNIT) tablet Take 1,000 Units by mouth daily.   Yes [provider]  ezetimibe (ZETIA) 10 MG tablet TAKE 1 TABLET BY MOUTH ONCE A DAY 05/11/20 05/11/21 Yes Rejeana Brock, MD  ferrous sulfate 325 (65 FE) MG tablet TAKE 1 TABLET BY MOUTH EVERY OTHER DAY FOR ANEMIA (SPANISH) 12/17/20 12/17/21 Yes Llamas, Jewel C, MD  fluticasone (FLONASE) 50 MCG/ACT nasal spray PLACE 1 SPRAY INTO BOTH NOSTRILS DAILY AS NEEDED 02/12/20 02/11/21 Yes Magdalen Spatz, NP  gabapentin (NEURONTIN) 300 MG capsule Take 300 mg by mouth 2 (two) times daily.   Yes [provider]  lisinopril-hydrochlorothiazide (ZESTORETIC) 20-12.5 MG tablet TAKE 1 TABLET BY MOUTH DAILY. 10/21/20  Yes End, Harrell Gave, MD  metFORMIN (GLUCOPHAGE-XR) 500 MG 24 hr tablet Take 1,000 mg by mouth in the morning and at bedtime.   Yes [provider]  metoprolol tartrate (LOPRESSOR) 25 MG tablet Take 2 tablets (50 mg total) by mouth 2 (two) times daily. 12/29/20 03/29/21 Yes End, Harrell Gave, MD  nitroGLYCERIN (NITROSTAT) 0.4 MG SL tablet Place 1 tablet (0.4 mg total) under the tongue every 5 (five) minutes as needed for chest pain. Maximum of 3 doses. 01/16/20  Yes End, Harrell Gave, MD  omeprazole (PRILOSEC) 20 MG capsule Take  20 mg by mouth daily as needed (acid reflux).   Yes [provider]  Tiotropium Bromide Monohydrate 1.25 MCG/ACT AERS INHALE 2 PUFFS INTO THE LUNGS DAILY. 02/12/20 02/11/21 Yes Magdalen Spatz, NP  TRUE METRIX BLOOD GLUCOSE TEST test strip  02/19/16  Yes [provider]  zinc gluconate 50 MG tablet Take 50 mg by mouth daily.   Yes [provider]      Review of Systems    She denies chest pain, palpitations, dyspnea, pnd, orthopnea, n, v, dizziness, syncope, edema, weight gain, or early satiety.  All other systems reviewed and are otherwise negative except as noted above.  Physical Exam    VS:  BP 100/64 (BP Location: Left Arm, Patient Position: Sitting, Cuff Size: Normal)   Pulse 88   Ht 5\' 3"  (1.6 m)   Wt 175 lb (79.4 kg)   SpO2 98%   BMI 31.00 kg/m  , BMI Body mass index is 31 kg/m. GEN: Well nourished, well developed, in no acute distress. HEENT: normal. Neck:  Supple, no JVD, carotid bruits, or masses. Cardiac: RRR, no murmurs, rubs, or gallops. No clubbing, cyanosis, edema.  Right radial catheterization site without bleeding, bruit, or hematoma.  Radials/PT 2+ and equal bilaterally.  Respiratory:  Respirations regular and unlabored, clear to auscultation bilaterally. GI: Soft, nontender, nondistended, BS + x 4. MS: no deformity or atrophy. Skin: warm and dry, no rash. Neuro:  Strength and sensation are intact. Psych: Normal affect.  Accessory Clinical Findings    ECG personally reviewed by me today -regular sinus rhythm, 88, prolonged QT-491 ms- no acute changes.  Lab Results  Component Value Date   WBC 9.6 12/25/2020   HGB 9.7 (L) 12/25/2020   HCT 31.9 (L) 12/25/2020   MCV 77.2 (L) 12/25/2020   PLT 485 (H) 12/25/2020   Lab Results  Component Value Date   CREATININE 0.80 12/16/2020   BUN 10 12/16/2020   NA 136 12/16/2020   K 4.4 12/16/2020   CL 100 12/16/2020   CO2 26 12/16/2020   Lab Results  Component Value Date   ALT 12 12/16/2020    AST 19 12/16/2020   ALKPHOS 69 12/16/2020   BILITOT 0.5 12/16/2020   Lab Results  Component Value Date   CHOL 161 12/16/2020   HDL 55 12/16/2020   LDLCALC 63 12/16/2020   TRIG 213 (H) 12/16/2020   CHOLHDL 2.9 12/16/2020    Lab Results  Component Value Date   HGBA1C 5.9 (H) 02/12/2020    Assessment & Plan    1.  Stable angina/coronary artery disease: Status post recent catheterization in the setting of progressive angina revealing nonobstructive CAD with a 20% stenosis in the second diagonal, 20% proximal/mid stenosis in RCA, and an 85% stenosis in the RPAV.  Medical therapy and management of anemia recommended.  Since her catheterization, she has had follow-up blood work with primary care and she reports that her hemoglobin was recently 8.  In this setting, she also has done well without recurrence of chest pain or dyspnea.  She does have follow-up with GI in early May.  She remains on aspirin, beta-blocker, ACE inhibitor, calcium channel blocker, and Zetia.  She did not previously tolerate oral nitrates.  2.  Essential hypertension: Stable on current regimen.  3.  Hyperlipidemia: LDL of 63 in March.  She remains on Zetia therapy.  She is previously intolerant to statins.  4.  Tobacco abuse: Still smoking 4 cigarettes a day though notes that she plans to continue to cut back.  We discussed about establishing a quit date and she thinks she will try and quit by June 1.  5.  Type 2 diabetes mellitus: A1c was 5.9 last May.  This followed by primary care and she remains on metformin and ACE inhibitor therapy.  6.  Disposition: Follow-up in clinic in 2 months or sooner if necessary.  Patient will have GI follow-up in early May.  Murray Hodgkins, NP 01/13/2021, 5:49 PM

## 2021-01-13 NOTE — Patient Instructions (Signed)
Medication Instructions:  No changes  *If you need a refill on your cardiac medications before your next appointment, please call your pharmacy*   Lab Work: None  If you have labs (blood work) drawn today and your tests are completely normal, you will receive your results only by: Marland Kitchen MyChart Message (if you have MyChart) OR . A paper copy in the mail If you have any lab test that is abnormal or we need to change your treatment, we will call you to review the results.   Testing/Procedures: None   Follow-Up: At [facility] you and your health needs are our priority.  As part of our continuing mission to provide you with exceptional heart care, we have created designated Provider Care Teams.  These Care Teams include your primary Cardiologist (physician) and Advanced Practice Providers (APPs -  Physician Assistants and Nurse Practitioners) who all work together to provide you with the care you need, when you need it.   Your next appointment:   2 month(s)  The format for your next appointment:   In Person  Provider:   [name], MD or Murray Hodgkins, NP

## 2021-01-14 DIAGNOSIS — G4733 Obstructive sleep apnea (adult) (pediatric): Secondary | ICD-10-CM | POA: Diagnosis not present

## 2021-01-18 IMAGING — CT CT CHEST LUNG CANCER SCREENING LOW DOSE W/O CM
2 of 5 series · 15 of 40 positions shown, 18 images · non-contrast
Comparison: Low-dose lung cancer screening CT chest dated
07/16/2018

CLINICAL DATA: 67-year-old female current smoker, with 32 pack-year
history of smoking, for follow-up lung cancer screening

EXAM:
CT CHEST WITHOUT CONTRAST LOW-DOSE FOR LUNG CANCER SCREENING
TECHNIQUE: Multidetector CT imaging of the chest was performed following the
standard protocol without IV contrast.

[Series 3: lung 1.00 · axial · 0.63mm/px · z∈[-1159,-880]mm · 12 of 307 slices shown, 15 images]
[im 14/307  mediastinal]
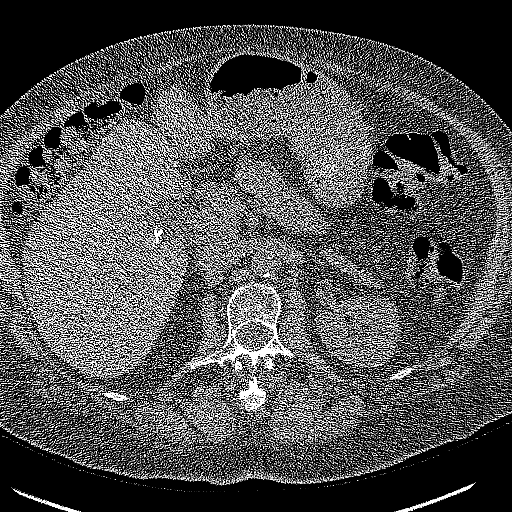
[im 14/307  lung]
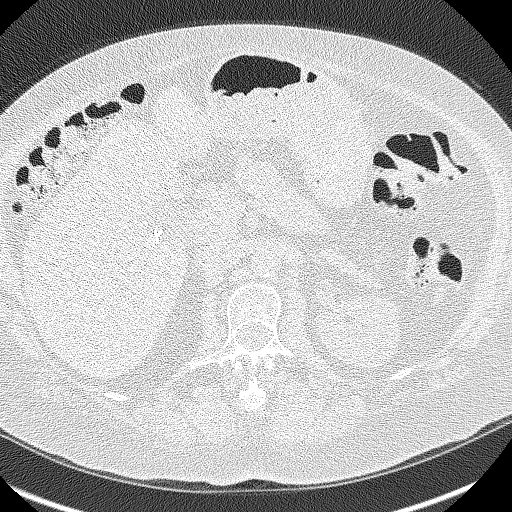
[im 42/307  lung]
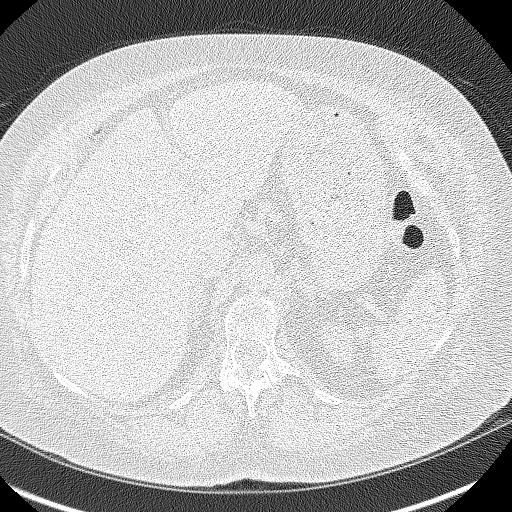
[im 70/307  lung]
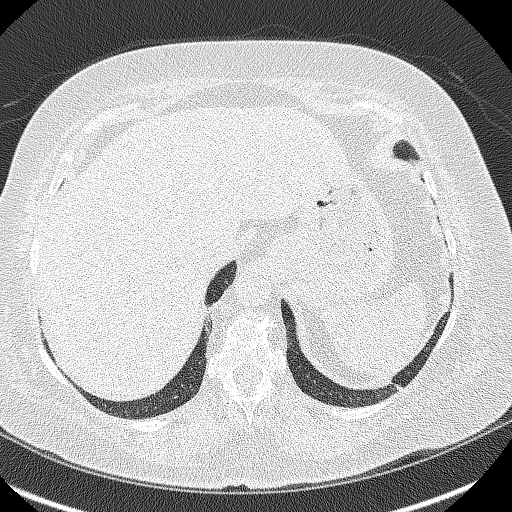
[im 98/307  lung]
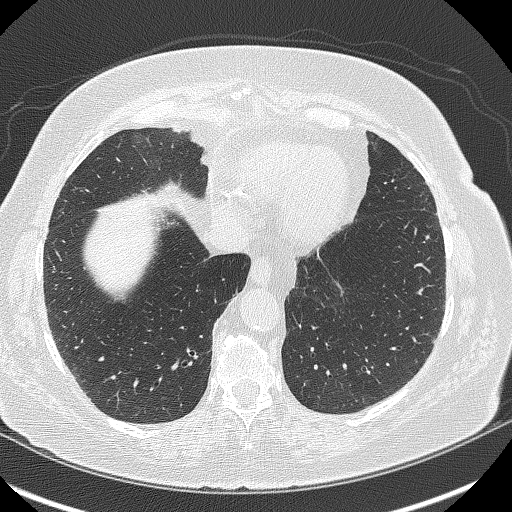
[im 112/307  mediastinal]
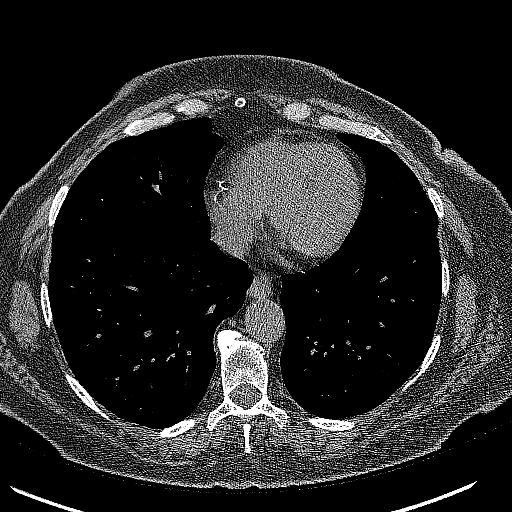
[im 112/307  lung]
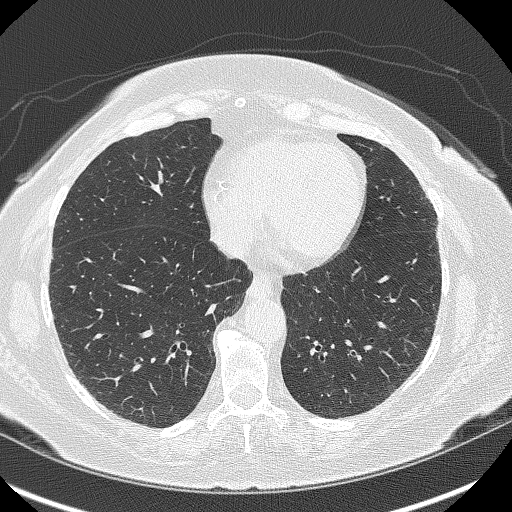
[im 140/307  lung]
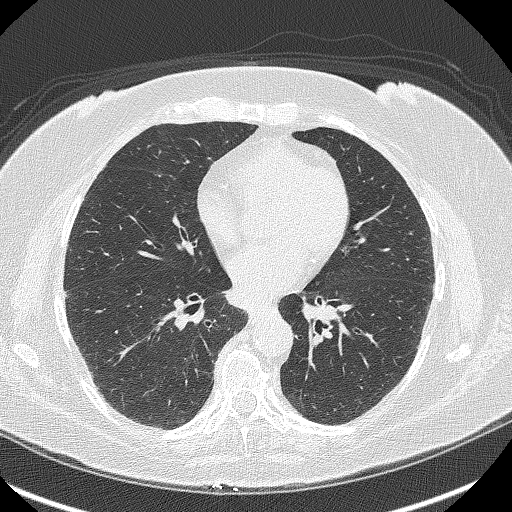
[im 167/307  lung]
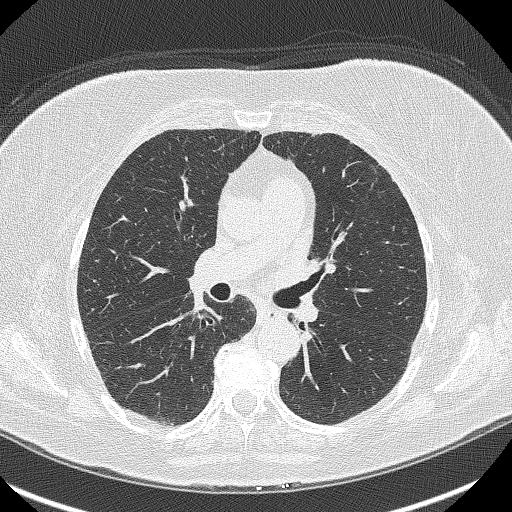
[im 195/307  lung]
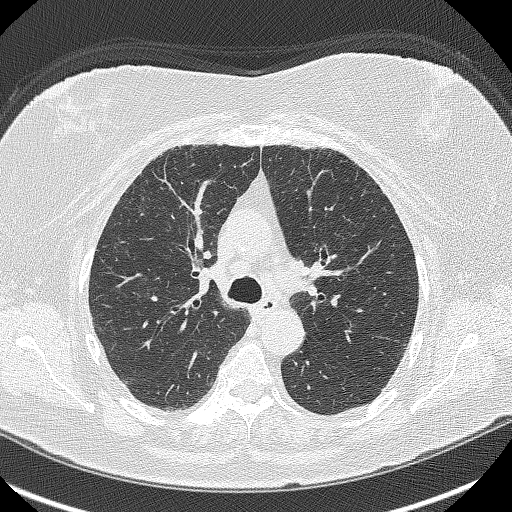
[im 209/307  mediastinal]
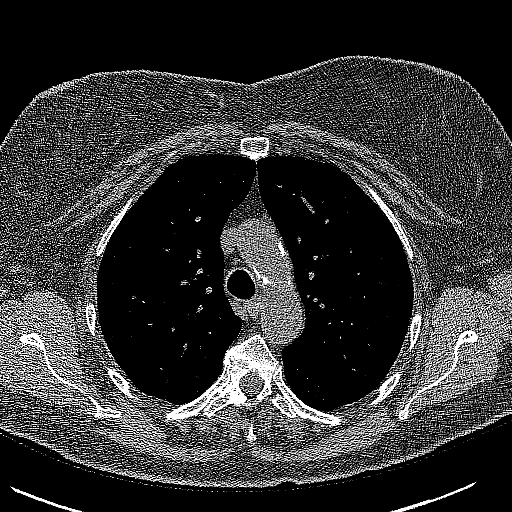
[im 209/307  lung]
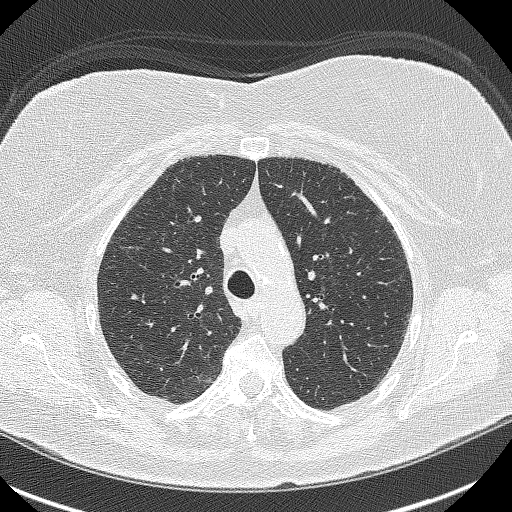
[im 237/307  lung]
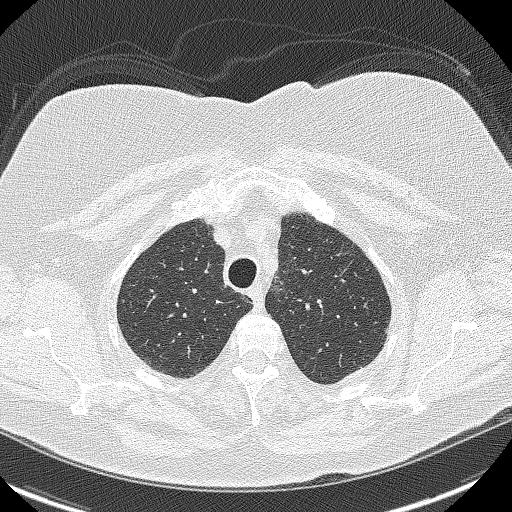
[im 265/307  lung]
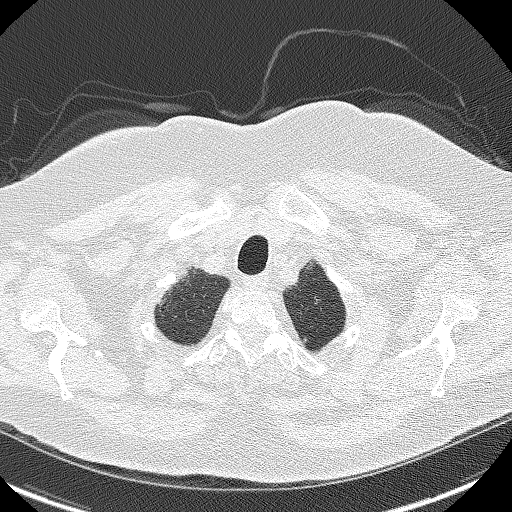
[im 293/307  lung]
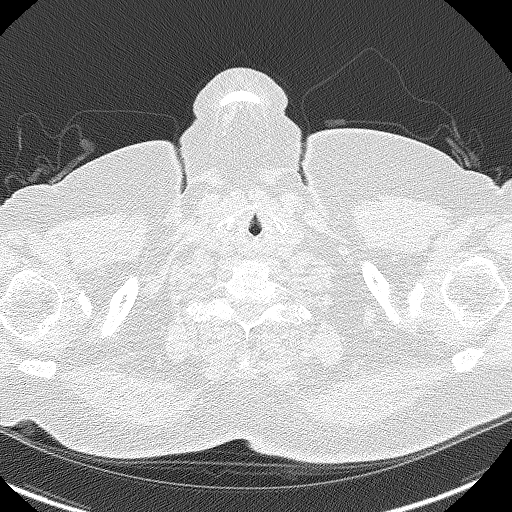

[Series 4: coronals lung 1.00 cor · coronal · 0.60mm/px · 3 of 304 slices shown]
[im 61/304  lung]
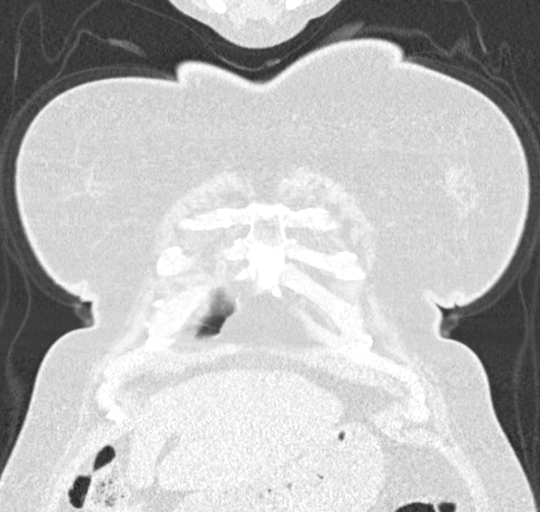
[im 122/304  lung]
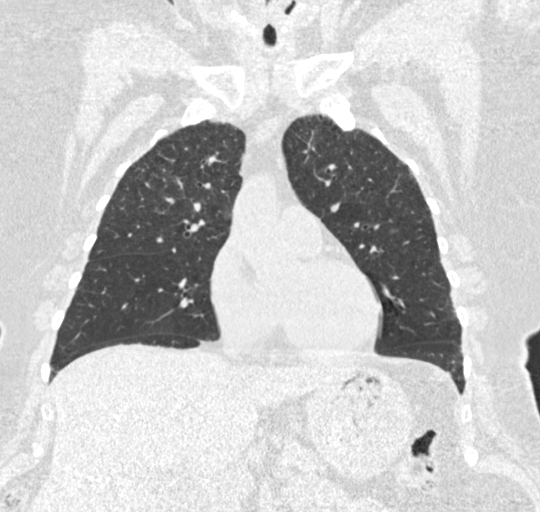
[im 182/304  lung]
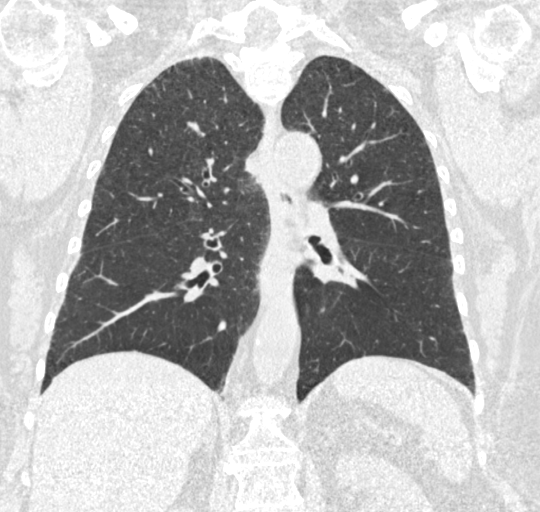

[15 of 40 positions shown; findings below may reference images not displayed]

FINDINGS: Cardiovascular: The heart is normal in size. No pericardial
effusion.

No evidence of thoracic aortic aneurysm. Atherosclerotic
calcifications of the aortic arch.

Mild coronary atherosclerosis of the left circumflex and right
coronary artery.

Mediastinum/Nodes: Small mediastinal lymph nodes which do not meet
pathologic CT size criteria.

Visualized thyroid is unremarkable.

Lungs/Pleura: Mild centrilobular and paraseptal emphysematous
changes, upper lung predominant.

Mild subpleural reticulation/fibrosis in the lungs bilaterally.

No focal consolidation.

Small subpleural nodules in the lungs bilaterally, measuring up to
2.9 mm.

No pleural effusion or pneumothorax.

Upper Abdomen: Visualized upper abdomen is notable for prior
cholecystectomy and vascular calcifications.

Musculoskeletal: Degenerative changes of the visualized
thoracolumbar spine.
IMPRESSION: Lung-RADS 2, benign appearance or behavior. Continue annual
screening with low-dose chest CT without contrast in 12 months.

Aortic Atherosclerosis (0KWWT-I2W.W) and Emphysema (0KWWT-OQA.7).

## 2021-01-22 ENCOUNTER — Telehealth: Payer: Self-pay | Admitting: Internal Medicine

## 2021-01-22 ENCOUNTER — Other Ambulatory Visit: Payer: Self-pay

## 2021-01-22 ENCOUNTER — Other Ambulatory Visit: Payer: Self-pay | Admitting: Internal Medicine

## 2021-01-22 MED ORDER — FREESTYLE LITE TEST VI STRP
ORAL_STRIP | 1 refills | Status: DC
  Filled 2021-01-22: qty 100, 90d supply, fill #0

## 2021-01-22 MED ORDER — METOPROLOL TARTRATE 25 MG PO TABS: 50.0000 mg | ORAL_TABLET | Freq: Two times a day (BID) | ORAL | 6 refills | Status: DC

## 2021-01-22 MED ORDER — METFORMIN HCL ER 500 MG PO TB24
ORAL_TABLET | ORAL | 10 refills | Status: DC
  Filled 2021-01-22: qty 120, 30d supply, fill #0
  Filled 2021-03-03: qty 120, 30d supply, fill #1
  Filled 2021-03-25 – 2021-04-27 (×2): qty 120, 30d supply, fill #2
  Filled 2021-06-20 – 2021-06-21 (×2): qty 120, 30d supply, fill #3
  Filled 2021-07-20: qty 120, 30d supply, fill #4
  Filled 2021-10-04: qty 120, 30d supply, fill #5

## 2021-01-22 MED ORDER — AMLODIPINE BESYLATE 2.5 MG PO TABS
2.5000 mg | ORAL_TABLET | Freq: Every day | ORAL | 3 refills | Status: DC
  Filled 2021-01-22: qty 90, 90d supply, fill #0

## 2021-01-22 NOTE — Telephone Encounter (Signed)
Spoke with patient and confirmed what she was taking and reviewed chart. She is taking as ordered and metoprolol was not discontinued. Advised I would call pharmacy to follow up on refills or any questions and would send in refills if needed. She was appreciative for call back with no further questions at this time.

## 2021-01-22 NOTE — Telephone Encounter (Signed)
Pt c/o medication issue:  1. Name of Medication: Metoprolol 25 mg bid, amlodipine  2.5 mg daily  2. How are you currently taking this medication (dosage and times per day)? See above  3. Are you having a reaction (difficulty breathing--STAT)?   4. What is your medication issue? [facility] employee pharmacy states the metropolol has been discontinued. And is patient is supposed to continue the amlodipine   Please call before 11 or after 11:30

## 2021-01-22 NOTE — Telephone Encounter (Signed)
Spoke with Kristi at pharmacy and she states they did not have updated prescription for the metoprolol. Advised that I would send in refills for patient. She had no further questions.

## 2021-01-25 ENCOUNTER — Ambulatory Visit: Payer: 59 | Admitting: Gastroenterology

## 2021-01-25 ENCOUNTER — Other Ambulatory Visit: Payer: Self-pay

## 2021-01-25 ENCOUNTER — Encounter: Payer: Self-pay | Admitting: Gastroenterology

## 2021-01-25 VITALS — BP 113/66 | HR 102 | Temp 97.9°F | Ht 63.0 in | Wt 175.8 lb

## 2021-01-25 DIAGNOSIS — D500 Iron deficiency anemia secondary to blood loss (chronic): Secondary | ICD-10-CM | POA: Diagnosis not present

## 2021-01-25 NOTE — Progress Notes (Signed)
Gastroenterology Consultation  Referring Provider:     [name], MD Primary Care Physician:  Rejeana Brock, MD Primary Gastroenterologist:  Dr. Allen Norris     Reason for Consultation:     Iron deficiency anemia        HPI:   Monica Oliver is a 70 y.o. y/o female referred for consultation & management of iron deficiency anemia by Dr. Rejeana Brock, MD.  This patient comes in today after being seen by me back in 2009 and then was seen by Dr. Herbert Deaner in 2012.  It appears that the patient then went to see Dr. Vira Agar and had a colonoscopy in 2017.  The reasons for the that colonoscopy was stated as a history of adenomatous polyps.  The report of that procedure and any pathology is not in the patient's chart since it was done at an outside facility by Dr. Vira Agar.  The patient's CBC has shown:  Component     Latest Ref Rng & Units 11/28/2017 12/16/2020 12/25/2020  Hemoglobin     12.0 - 15.0 g/dL 15.2 9.7 (L) 9.7 (L)  HCT     36.0 - 46.0 % 45.6 31.6 (L) 31.9 (L)   The patient had lab work done on March 29 of this year that showed a ferritin of 10 and a iron saturation of 3%.  The patient's total iron was 15. The patient denies any black stools bloody stools abdominal pain nausea vomiting fevers chills or unexplained weight loss  Past Medical History:  Diagnosis Date  . Allergy    environmental  . Arthritis   . Asthma   . CAD (coronary artery disease)    a. 08/2018 Cardiac CTA: CAC score 306 (91st %'ile). LM nl, LAD nl, LCX small w/ <50% mid stenosis. RI nl, RCA <50% mid, 51-69% distal (CTFFR of rPL 0.52); b. 12/2020 Cath: LM nl, LAD nl, D2 20, LCX nl, RCA 20p/m, RPAV 85. EF 55-65%-->Med Rx.  . Claudication ([place])    a. 08/2019 ABI's nl.  . COPD (chronic obstructive pulmonary disease) ([place])   . Diabetes mellitus without complication ([place])   . Diastolic dysfunction    a. 10/2018 Echo: EF 55-60%, Gr1 DD. Nl RV size/fxn.  Marland Kitchen GERD (gastroesophageal reflux disease)   . Hyperlipidemia   .  Hypertension   . Sleep apnea    C-Pap machine  . Tobacco abuse     Past Surgical History:  Procedure Laterality Date  . CESAREAN SECTION     1986  . CHOLECYSTECTOMY     1993  . LEFT HEART CATH AND CORONARY ANGIOGRAPHY Left 12/29/2020   Procedure: LEFT HEART CATH AND CORONARY ANGIOGRAPHY;  Surgeon: [name], MD;  Location: [place] [facility];  Service: Cardiovascular;  Laterality: Left;    Prior to Admission medications   Medication Sig Start Date End Date Taking? Authorizing Provider  albuterol (PROVENTIL) (2.5 MG/3ML) 0.083% nebulizer solution TAKE 3 MLS (2.5 MG TOTAL) BY NEBULIZATION EVERY 6 HOURS AS NEEDED FOR WHEEZING OR SHORTNESS OF BREATH. 12/25/20   Flora Lipps, MD  albuterol (VENTOLIN HFA) 108 (90 Base) MCG/ACT inhaler INHALE 2 PUFFS BY MOUTH EVERY 4 HOURS AS NEEDED 08/14/20 08/14/21  Flora Lipps, MD  amLODipine (NORVASC) 2.5 MG tablet TAKE 1 TABLET BY MOUTH DAILY. 07/14/20 07/14/21  End, Harrell Gave, MD  amLODipine (NORVASC) 2.5 MG tablet Take 1 tablet (2.5 mg total) by mouth daily. 01/22/21 04/22/21  End, Harrell Gave, MD  aspirin 81 MG tablet Take 81 mg by mouth daily.  [provider]  budesonide-formoterol (SYMBICORT) 160-4.5 MCG/ACT inhaler INHALE 2 PUFFS BY MOUTH TWO TIMES DAILY 02/12/20 02/11/21  Magdalen Spatz, NP  celecoxib (CELEBREX) 200 MG capsule TAKE 1 CAPSULE BY MOUTH ONCE DAILY 04/09/20 04/09/21  Dimmig, Marcello Moores, MD  cetirizine (ZYRTEC) 10 MG tablet Take 10 mg by mouth daily as needed for allergies.    [provider]  cholecalciferol (VITAMIN D3) 25 MCG (1000 UNIT) tablet Take 1,000 Units by mouth daily.    [provider]  ezetimibe (ZETIA) 10 MG tablet TAKE 1 TABLET BY MOUTH ONCE A DAY 05/11/20 05/11/21  Rejeana Brock, MD  ferrous sulfate 325 (65 FE) MG tablet TAKE 1 TABLET BY MOUTH EVERY OTHER DAY FOR ANEMIA (SPANISH) 12/17/20 12/17/21  Llamas, Jewel C, MD  fluticasone (FLONASE) 50 MCG/ACT nasal spray PLACE 1 SPRAY INTO BOTH NOSTRILS  DAILY AS NEEDED 02/12/20 02/11/21  Magdalen Spatz, NP  gabapentin (NEURONTIN) 300 MG capsule Take 300 mg by mouth 2 (two) times daily.    [provider]  glucose blood (FREESTYLE LITE) test strip Test once a day as directed 02/21/20     lisinopril-hydrochlorothiazide (ZESTORETIC) 20-12.5 MG tablet TAKE 1 TABLET BY MOUTH DAILY. 10/21/20   End, Harrell Gave, MD  metFORMIN (GLUCOPHAGE-XR) 500 MG 24 hr tablet Take 1,000 mg by mouth in the morning and at bedtime.    [provider]  metFORMIN (GLUCOPHAGE-XR) 500 MG 24 hr tablet Take 2 tablets by mouth twice a day for diabetes 10/22/20     metoprolol tartrate (LOPRESSOR) 25 MG tablet Take 2 tablets (50 mg total) by mouth 2 (two) times daily. 01/22/21 04/22/21  End, Harrell Gave, MD  nitroGLYCERIN (NITROSTAT) 0.4 MG SL tablet Place 1 tablet (0.4 mg total) under the tongue every 5 (five) minutes as needed for chest pain. Maximum of 3 doses. 01/16/20   End, Harrell Gave, MD  omeprazole (PRILOSEC) 20 MG capsule Take 20 mg by mouth daily as needed (acid reflux).    [provider]  Tiotropium Bromide Monohydrate 1.25 MCG/ACT AERS INHALE 2 PUFFS INTO THE LUNGS DAILY. 02/12/20 02/11/21  Magdalen Spatz, NP  TRUE METRIX BLOOD GLUCOSE TEST test strip  02/19/16   [provider]  zinc gluconate 50 MG tablet Take 50 mg by mouth daily.    [provider]    Family History  Problem Relation Age of Onset  . Hypertension Mother   . Cancer Mother        lung  . Cancer Father        prostate  . Hyperlipidemia Father   . Heart attack Father 69  . Cancer Brother        prostate  . Cirrhosis Sister   . Hepatitis Sister   . Breast cancer Paternal Aunt      Social History   Tobacco Use  . Smoking status: Current Every Day Smoker    Packs/day: 0.75    Years: 40.00    Pack years: 30.00    Types: Cigarettes    Start date: 12/15/1971  . Smokeless tobacco: Never Used  . Tobacco comment: 02/12/20: smoking 5 ciggaretts daily   Vaping Use  . Vaping Use: Never used  Substance Use Topics  . Alcohol use: No    Alcohol/week: 0.0 standard drinks  . Drug use: No    Allergies as of 01/25/2021 - Review Complete 01/13/2021  Allergen Reaction Noted  . Statins  01/13/2021  . Penicillins Other (See Comments) 12/15/2014    Review of Systems:  All systems reviewed and negative except where noted in HPI.   Physical Exam:  There were no vitals taken for this visit. No LMP recorded. Patient is postmenopausal. General:   Alert,  Well-developed, well-nourished, pleasant and cooperative in NAD Head:  Normocephalic and atraumatic. Eyes:  Sclera clear, no icterus.   Conjunctiva pink. Ears:  Normal auditory acuity. Neck:  Supple; no masses or thyromegaly. Lungs:  Respirations even and unlabored.  Clear throughout to auscultation.   No wheezes, crackles, or rhonchi. No acute distress. Heart:  Regular rate and rhythm; no murmurs, clicks, rubs, or gallops. Abdomen:  Normal bowel sounds.  No bruits.  Soft, non-tender and non-distended without masses, hepatosplenomegaly or hernias noted.  No guarding or rebound tenderness.  Negative Carnett sign.   Rectal:  Deferred.  Pulses:  Normal pulses noted. Extremities:  No clubbing or edema.  No cyanosis. Neurologic:  Alert and oriented x3;  grossly normal neurologically. Skin:  Intact without significant lesions or rashes.  No jaundice. Lymph Nodes:  No significant cervical adenopathy. Psych:  Alert and cooperative. Normal mood and affect.  Imaging Studies: CARDIAC CATHETERIZATION  Result Date: 12/29/2020 Conclusions: 1. Severe single-vessel coronary artery disease with 80 to 90% stenosis of the RPAV.  There is mild, nonobstructive disease involving the mid RCA and ostium of D2. 2. Normal left ventricular systolic function with mildly elevated filling pressure. Recommendations: 1. Optimize medical therapy for treatment of angina and progression of coronary artery disease.   Specifically, we will increase metoprolol tartrate to 50 mg twice daily.  Continue ezetimibe in lieu of statin therapy given history of statin intolerance and well-controlled LDL. 2. Proceed with work-up for recently diagnosed iron deficiency anemia including colonoscopy +/- upper endoscopy. 3. If patient has refractory angina, PCI to the RPAV could be considered once evaluation/treatment of her newly diagnosed anemia has been completed. [name], MD [facility]   Pulmonary Function Test [facility] Only  Result Date: 12/30/2020 Spirometry Data Is Acceptable and Reproducible No obvious evidence of Obstructive Airways disease or Restrictive Lung disease Consider outpatient follow up with Pulmonary if needed. Clinical Correlation Advised    Assessment and Plan:   Monica Oliver is a 70 y.o. y/o female who comes in today with a finding of iron deficiency anemia.  The patient denies any overt blood loss.  The patient will be set up for an EGD and colonoscopy and has been told that if the EGD and colonoscopy are negative that she may need a capsule endoscopy.  The patient has been explained the plan and agrees with it.    [name], MD. Marval Regal    Note: This dictation was prepared with Dragon dictation along with smaller phrase technology. Any transcriptional errors that result from this process are unintentional.

## 2021-01-26 ENCOUNTER — Ambulatory Visit: Payer: 59 | Admitting: Internal Medicine

## 2021-01-26 ENCOUNTER — Other Ambulatory Visit: Payer: Self-pay | Admitting: Internal Medicine

## 2021-01-26 ENCOUNTER — Other Ambulatory Visit: Payer: Self-pay

## 2021-01-26 ENCOUNTER — Encounter: Payer: Self-pay | Admitting: Internal Medicine

## 2021-01-26 VITALS — BP 120/68 | HR 98 | Temp 97.7°F | Ht 63.0 in | Wt 176.4 lb

## 2021-01-26 DIAGNOSIS — I200 Unstable angina: Secondary | ICD-10-CM

## 2021-01-26 DIAGNOSIS — J449 Chronic obstructive pulmonary disease, unspecified: Secondary | ICD-10-CM | POA: Diagnosis not present

## 2021-01-26 DIAGNOSIS — Z9989 Dependence on other enabling machines and devices: Secondary | ICD-10-CM | POA: Diagnosis not present

## 2021-01-26 DIAGNOSIS — G4733 Obstructive sleep apnea (adult) (pediatric): Secondary | ICD-10-CM | POA: Diagnosis not present

## 2021-01-26 MED ORDER — METOPROLOL TARTRATE 25 MG PO TABS
50.0000 mg | ORAL_TABLET | Freq: Two times a day (BID) | ORAL | 2 refills | Status: DC
  Filled 2021-01-26: qty 60, 15d supply, fill #0
  Filled 2021-02-15: qty 60, 15d supply, fill #1
  Filled 2021-03-18: qty 60, 15d supply, fill #2

## 2021-01-26 NOTE — Progress Notes (Signed)
PULMONARY OFFICE FOLLOW UP NOTE  PROBLEMS:  Mild COPD Smoker  DATA: LDCT 06/07/16: normal PFTs 06/13/16: mild obstruction with borderline significant improvement after bronchodilator. Normal TLC. Mildly reduced DLCO  INTERVAL HISTORY: Previously seen by VM and diagnosed with mild COPD.    CC  Follow-up RB ILD Follow-up OSA Follow-up abnormal CT chest       HPI Diagnosis of OSA CPAP 8 cm water pressure Excellent compliance report AHI reduced to 0.3  She definitely benefits and use CPAP therapy nightly  No exacerbation at this time No evidence of heart failure at this time No evidence or signs of infection at this time No respiratory distress No fevers, chills, nausea, vomiting, diarrhea No evidence of lower extremity edema No evidence hemoptysis Findings consistent with RB ILD from smoking    Patient with previous history of bilateral lung nodules Previous CT chest July 08, 2020 Patient will need ongoing CT chest due to ongoing tobacco abuse   Smoking Assessment and Cessation Counseling Upon further questioning, Patient smokes 1 PPD I have advised patient to quit/stop smoking as soon as possible due to high risk for multiple medical problems  Patient is  willing to quit smoking  I have advised patient that we can assist and have options of Nicotine replacement therapy. I also advised patient on behavioral therapy and can provide oral medication therapy in conjunction with the other therapies Follow up next Office visit  for assessment of smoking cessation Smoking cessation counseling advised for 4 minutes    Review of Systems:  Gen:  Denies  fever, sweats, chills weight loss  HEENT: Denies blurred vision, double vision, ear pain, eye pain, hearing loss, nose bleeds, sore throat Cardiac:  No dizziness, chest pain or heaviness, chest tightness,edema, No JVD Resp:  +cough, -sputum production, +shortness of breath,+wheezing, -hemoptysis,  Gi: Denies  swallowing difficulty, stomach pain, nausea or vomiting, diarrhea, constipation, bowel incontinence Gu:  Denies bladder incontinence, burning urine Ext:   Denies Joint pain, stiffness or swelling Skin: Denies  skin rash, easy bruising or bleeding or hives Endoc:  Denies polyuria, polydipsia , polyphagia or weight change Psych:   Denies depression, insomnia or hallucinations  Other:  All other systems negative   BP 120/68 (BP Location: Left Arm, Cuff Size: Normal)   Pulse 98   Temp 97.7 F (36.5 C) (Temporal)   Ht 5\' 3"  (1.6 m)   Wt 176 lb 6.4 oz (80 kg)   SpO2 97%   BMI 31.25 kg/m   Physical Examination:   General Appearance: No distress  Neuro:without focal findings,  speech normal,  HEENT: PERRLA, EOM intact.   Pulmonary: normal breath sounds, No wheezing.  CardiovascularNormal S1,S2.  No m/r/g.   Abdomen: Benign, Soft, non-tender. Renal:  No costovertebral tenderness  GU:  Not performed at this time. Endoc: No evident thyromegaly Skin:   warm, no rashes, no ecchymosis  Extremities: normal, no cyanosis, clubbing. PSYCHIATRIC: Mood, affect within normal limits.   ALL OTHER ROS ARE NEGATIVE    ALL OTHER ROS ARE NEGATIVE    DATA:   CXR 09/24/16: vague left upper lobe opacity   CXR 11/10/16 : resolution of LUL opacity CT chest 07/16/2018-and discrete bilateral pulmonary nodules not visualized but reported Mild lymphadenopathy anterior tracheal space  CT chest 06/2020 No suspicious focal pulmonary nodules or infiltrates. Stable subtle peribronchial nodularity within the lung apices bilaterally possibly representing changes of RB-ILD/smoking related lung disease.  CT scan results reviewed with patient in detail   ASSESSMENT AND  PLAN  70 year old pleasant female with underlying diagnosis of COPD with obstructive sleep apnea in setting of bilateral scattered pulmonary nodules in the setting of tobacco abuse, although PFTs show no significant abnormality however CT  of the chest shows some underlying interstitial lung disease most likely and probably related to RB ILD, in the setting of obesity and deconditioned state   COPD mild gold stage A Continue inhalers as prescribed with Spiriva  Albuterol as needed  No exacerbation at this time   OSA Excellent compliance report and is benefiting from therapy CPAP 8 Continue as prescribed AHI down to 0.3   Smoking cessation strongly advised  Obesity -recommend significant weight loss -recommend changing diet  Deconditioned state -Recommend increased daily activity and exercise  Lung cancer screening program Previous history of discrete pulmonary nodules with some anterior tracheal adenopathy Previous CT scan October 2021 shows no suspicious focal pulmonary nodules or infiltrates Findings could reflect RB ILD smoking-related lung disease    MEDICATION ADJUSTMENTS/LABS AND TESTS ORDERED: Please stop smoking continue CPAP as prescribed Symbicort as needed Continue Spiriva as prescribed Albuterol as needed Repeat CT chest in 1 year   CURRENT MEDICATIONS REVIEWED AT LENGTH WITH PATIENT TODAY   Follow-up in 1 year Total time spent 33 minutes    Kiyan Burmester Patricia Pesa, M.D.  [facility]  Medical Director [place] Director [facility] Cardio-Pulmonary Department

## 2021-01-26 NOTE — Telephone Encounter (Signed)
Rx request sent to pharmacy.  

## 2021-01-26 NOTE — Patient Instructions (Addendum)
Please stop smoking continue CPAP as prescribed Continue Symbicort as needed Continue Spiriva as prescribed Albuterol as needed Follow up Lung cancer screening

## 2021-01-27 ENCOUNTER — Other Ambulatory Visit: Payer: Self-pay

## 2021-01-27 MED FILL — Tiotropium Bromide Monohydrate Inhal Aerosol 1.25 MCG/ACT: RESPIRATORY_TRACT | 30 days supply | Qty: 4 | Fill #0 | Status: AC

## 2021-01-27 MED FILL — Budesonide-Formoterol Fumarate Dihyd Aerosol 160-4.5 MCG/ACT: RESPIRATORY_TRACT | 30 days supply | Qty: 10.2 | Fill #0 | Status: AC

## 2021-01-28 ENCOUNTER — Encounter: Payer: Self-pay | Admitting: Emergency Medicine

## 2021-01-28 ENCOUNTER — Ambulatory Visit
Admission: EM | Admit: 2021-01-28 | Discharge: 2021-01-28 | Disposition: A | Discharge status: Home / Self Care | Payer: 59 | Attending: Emergency Medicine | Admitting: Emergency Medicine

## 2021-01-28 ENCOUNTER — Other Ambulatory Visit: Payer: Self-pay

## 2021-01-28 DIAGNOSIS — R21 Rash and other nonspecific skin eruption: Secondary | ICD-10-CM | POA: Diagnosis not present

## 2021-01-28 DIAGNOSIS — S30860A Insect bite (nonvenomous) of lower back and pelvis, initial encounter: Secondary | ICD-10-CM | POA: Diagnosis not present

## 2021-01-28 DIAGNOSIS — W57XXXA Bitten or stung by nonvenomous insect and other nonvenomous arthropods, initial encounter: Secondary | ICD-10-CM | POA: Diagnosis not present

## 2021-01-28 MED ORDER — HYDROCORTISONE 1 % EX CREA
TOPICAL_CREAM | CUTANEOUS | 0 refills | Status: DC
  Filled 2021-01-28: qty 15, fill #0

## 2021-01-28 NOTE — Discharge Instructions (Signed)
Apply hydrocortisone cream to the area as directed.    Follow up with your primary care provider if your symptoms are not improving.

## 2021-01-28 NOTE — ED Triage Notes (Signed)
Patient reports she has a "bump" on buttocks, left.  Patient noticed this area yesterday.  Reports it is itching, but not painful

## 2021-01-28 NOTE — ED Provider Notes (Signed)
Monica Oliver    CSN: [id number] Arrival date & time: 01/28/21  1147      History   Chief Complaint Chief Complaint  Patient presents with  . Abscess    HPI Monica Oliver is a 70 y.o. female.   Patient presents with small bump on her left buttock which she noticed yesterday.  The area is mildly pruritic but nontender.  She denies drainage, fever, chills, other rash, or other symptoms.  She believes this is an insect bite but did not see what bit her.  No recent tick exposure.  Treatment attempted at home with topical antibiotics.  Her medical history includes diabetes, hypertension, prolonged QT, dysfunction, COPD, current everyday smoker.  The history is provided by the patient and medical records.    Past Medical History:  Diagnosis Date  . Allergy    environmental  . Arthritis   . Asthma   . CAD (coronary artery disease)    a. 08/2018 Cardiac CTA: CAC score 306 (91st %'ile). LM nl, LAD nl, LCX small w/ <50% mid stenosis. RI nl, RCA <50% mid, 51-69% distal (CTFFR of rPL 0.52); b. 12/2020 Cath: LM nl, LAD nl, D2 20, LCX nl, RCA 20p/m, RPAV 85. EF 55-65%-->Med Rx.  . Claudication ([place])    a. 08/2019 ABI's nl.  . COPD (chronic obstructive pulmonary disease) ([place])   . Diabetes mellitus without complication ([place])   . Diastolic dysfunction    a. 10/2018 Echo: EF 55-60%, Gr1 DD. Nl RV size/fxn.  Marland Kitchen GERD (gastroesophageal reflux disease)   . Hyperlipidemia   . Hypertension   . Sleep apnea    C-Pap machine  . Tobacco abuse     Patient Active Problem List   Diagnosis Date Noted  . Accelerating angina ([place]) 12/16/2020  . Hyperlipidemia associated with type 2 diabetes mellitus ([place]) 12/16/2020  . CAD (coronary artery disease)   . Diabetes mellitus without complication ([place])   . Diastolic dysfunction   . Hyperlipidemia   . Hypertension   . QT prolongation 07/10/2019  . Coronary artery disease of native artery of native heart with stable angina pectoris ([place]) 10/10/2018   . Stable angina ([place]) 08/10/2018  . Hyperlipidemia LDL goal <70 08/10/2018  . Essential hypertension 08/10/2018  . Aortic atherosclerosis ([place]) 08/10/2018  . Tobacco abuse 06/07/2016  . Chronic obstructive pulmonary disease ([place]) 04/18/2016  . DOE (dyspnea on exertion) 04/18/2016  . Tobacco abuse counseling 04/18/2016  . Smoker 04/18/2016  . OSA on CPAP 04/18/2016  . Diabetes ([place]) 05/04/2015    Past Surgical History:  Procedure Laterality Date  . CESAREAN SECTION     1986  . CHOLECYSTECTOMY     1993  . LEFT HEART CATH AND CORONARY ANGIOGRAPHY Left 12/29/2020   Procedure: LEFT HEART CATH AND CORONARY ANGIOGRAPHY;  Surgeon: [name], MD;  Location: [facility];  Service: Cardiovascular;  Laterality: Left;    OB History   No obstetric history on file.      Home Medications    Prior to Admission medications   Medication Sig Start Date End Date Taking? Authorizing Provider  albuterol (VENTOLIN HFA) 108 (90 Base) MCG/ACT inhaler INHALE 2 PUFFS BY MOUTH EVERY 4 HOURS AS NEEDED 08/14/20 08/14/21 Yes Kasa, Kurian, MD  amLODipine (NORVASC) 2.5 MG tablet TAKE 1 TABLET BY MOUTH DAILY. 07/14/20 07/14/21 Yes End, Harrell Gave, MD  aspirin 81 MG tablet Take 81 mg by mouth daily.   Yes [provider]  budesonide-formoterol (SYMBICORT) 160-4.5 MCG/ACT inhaler INHALE  2 PUFFS BY MOUTH TWO TIMES DAILY 02/12/20 02/26/21 Yes Magdalen Spatz, NP  cetirizine (ZYRTEC) 10 MG tablet Take 10 mg by mouth daily as needed for allergies.   Yes [provider]  cholecalciferol (VITAMIN D3) 25 MCG (1000 UNIT) tablet Take 1,000 Units by mouth daily.   Yes [provider]  ezetimibe (ZETIA) 10 MG tablet TAKE 1 TABLET BY MOUTH ONCE A DAY 05/11/20 05/11/21 Yes Rejeana Brock, MD  ferrous sulfate 325 (65 FE) MG tablet TAKE 1 TABLET BY MOUTH EVERY OTHER DAY FOR ANEMIA (SPANISH) 12/17/20 12/17/21 Yes Llamas, Jewel C, MD  fluticasone (FLONASE) 50 MCG/ACT nasal spray PLACE 1 SPRAY  INTO BOTH NOSTRILS DAILY AS NEEDED 02/12/20 02/11/21 Yes Magdalen Spatz, NP  gabapentin (NEURONTIN) 300 MG capsule Take 300 mg by mouth 2 (two) times daily.   Yes [provider]  hydrocortisone cream 1 % Apply to affected area 2 times daily 01/28/21  Yes Sharion Balloon, NP  lisinopril-hydrochlorothiazide (ZESTORETIC) 20-12.5 MG tablet TAKE 1 TABLET BY MOUTH DAILY. 10/21/20  Yes End, Harrell Gave, MD  metFORMIN (GLUCOPHAGE-XR) 500 MG 24 hr tablet Take 2 tablets by mouth twice a day for diabetes 10/22/20  Yes   metoprolol tartrate (LOPRESSOR) 25 MG tablet Take 2 tablets (50 mg total) by mouth 2 (two) times daily. 01/22/21 04/22/21 Yes End, Harrell Gave, MD  omeprazole (PRILOSEC) 20 MG capsule Take 20 mg by mouth daily as needed (acid reflux).   Yes [provider]  Tiotropium Bromide Monohydrate 1.25 MCG/ACT AERS INHALE 2 PUFFS INTO THE LUNGS DAILY. 02/12/20 02/26/21 Yes Magdalen Spatz, NP  zinc gluconate 50 MG tablet Take 50 mg by mouth daily.   Yes [provider]  albuterol (PROVENTIL) (2.5 MG/3ML) 0.083% nebulizer solution TAKE 3 MLS (2.5 MG TOTAL) BY NEBULIZATION EVERY 6 HOURS AS NEEDED FOR WHEEZING OR SHORTNESS OF BREATH. 12/25/20   Flora Lipps, MD  celecoxib (CELEBREX) 200 MG capsule TAKE 1 CAPSULE BY MOUTH ONCE DAILY Patient not taking: Reported on 01/28/2021 04/09/20 04/09/21  Dimmig, Marcello Moores, MD  glucose blood (FREESTYLE LITE) test strip Test once a day as directed Patient taking differently: Test once a day as directed 02/21/20     metoprolol tartrate (LOPRESSOR) 25 MG tablet Take 2 tablets (50 mg total) by mouth 2 (two) times daily. 01/26/21 04/26/21  End, Harrell Gave, MD  nitroGLYCERIN (NITROSTAT) 0.4 MG SL tablet Place 1 tablet (0.4 mg total) under the tongue every 5 (five) minutes as needed for chest pain. Maximum of 3 doses. 01/16/20   End, Harrell Gave, MD  TRUE METRIX BLOOD GLUCOSE TEST test strip  02/19/16   [provider]    Family History Family History  Problem  Relation Age of Onset  . Hypertension Mother   . Cancer Mother        lung  . Cancer Father        prostate  . Hyperlipidemia Father   . Heart attack Father 14  . Cancer Brother        prostate  . Cirrhosis Sister   . Hepatitis Sister   . Breast cancer Paternal Aunt     Social History Social History   Tobacco Use  . Smoking status: Current Every Day Smoker    Packs/day: 0.75    Years: 40.00    Pack years: 30.00    Types: Cigarettes    Start date: 12/15/1971  . Smokeless tobacco: Never Used  . Tobacco comment: 1 cig daily--01/26/2021  Vaping Use  . Vaping  Use: Never used  Substance Use Topics  . Alcohol use: No    Alcohol/week: 0.0 standard drinks  . Drug use: No     Allergies   Statins and Penicillins   Review of Systems Review of Systems  Constitutional: Negative for chills and fever.  Respiratory: Negative for cough and shortness of breath.   Cardiovascular: Negative for chest pain and palpitations.  Gastrointestinal: Negative for abdominal pain and vomiting.  Musculoskeletal: Negative for arthralgias and back pain.  Skin: Positive for wound. Negative for color change.  Neurological: Negative for weakness and numbness.  All other systems reviewed and are negative.    Physical Exam Triage Vital Signs ED Triage Vitals  Enc Vitals Group     BP      Pulse      Resp      Temp      Temp src      SpO2      Weight      Height      Head Circumference      Peak Flow      Pain Score      Pain Loc      Pain Edu?      Excl. in [place]?    No data found.  Updated Vital Signs BP 127/80 (BP Location: Right Arm)   Pulse 80   Temp 98.2 F (36.8 C) (Oral)   Resp 18   SpO2 95%   Visual Acuity Right Eye Distance:   Left Eye Distance:   Bilateral Distance:    Right Eye Near:   Left Eye Near:    Bilateral Near:     Physical Exam Vitals and nursing note reviewed.  Constitutional:      General: She is not in acute distress.    Appearance: She is  well-developed. She is not ill-appearing.  HENT:     Head: Normocephalic and atraumatic.     Mouth/Throat:     Mouth: Mucous membranes are moist.  Eyes:     Conjunctiva/sclera: Conjunctivae normal.  Cardiovascular:     Rate and Rhythm: Normal rate and regular rhythm.     Heart sounds: Normal heart sounds.  Pulmonary:     Effort: Pulmonary effort is normal. No respiratory distress.     Breath sounds: Normal breath sounds.  Abdominal:     Palpations: Abdomen is soft.     Tenderness: There is no abdominal tenderness.  Musculoskeletal:        General: No swelling or deformity. Normal range of motion.     Cervical back: Neck supple.  Skin:    General: Skin is warm and dry.     Findings: Lesion present.     Comments: 1 cm circular area of erythema with central 1 mm lesion.  No drainage or red streaks.  Neurological:     General: No focal deficit present.     Mental Status: She is alert and oriented to person, place, and time.     Gait: Gait normal.  Psychiatric:        Mood and Affect: Mood normal.        Behavior: Behavior normal.      UC Treatments / Results  Labs (all labs ordered are listed, but only abnormal results are displayed) Labs Reviewed - No data to display  EKG   Radiology No results found.  Procedures Procedures (including critical care time)  Medications Ordered in UC Medications - No data to display  Initial Impression /  Assessment and Plan / UC Course  I have reviewed the triage vital signs and the nursing notes.  Pertinent labs & imaging results that were available during my care of the patient were reviewed by me and considered in my medical decision making (see chart for details).   Insect bite of buttock, Rash.  Patient is well-appearing, vital signs stable.  No indication of infection.  Treating with hydrocortisone cream.  Instructed patient to follow-up with her PCP or return here if her symptoms or not improving.  She agrees to plan of  care.   Final Clinical Impressions(s) / UC Diagnoses   Final diagnoses:  Insect bite of buttock, initial encounter  Rash and nonspecific skin eruption     Discharge Instructions     Apply hydrocortisone cream to the area as directed.    Follow up with your primary care provider if your symptoms are not improving.        ED Prescriptions    Medication Sig Dispense Auth. Provider   hydrocortisone cream 1 % Apply to affected area 2 times daily 15 g Sharion Balloon, NP     PDMP not reviewed this encounter.   Sharion Balloon, NP 01/28/21 1229

## 2021-01-29 ENCOUNTER — Other Ambulatory Visit: Payer: Self-pay

## 2021-02-01 ENCOUNTER — Encounter: Payer: Self-pay | Admitting: Gastroenterology

## 2021-02-02 ENCOUNTER — Ambulatory Visit: Payer: 59 | Admitting: Certified Registered"

## 2021-02-02 ENCOUNTER — Ambulatory Visit
Admission: RE | Admit: 2021-02-02 | Discharge: 2021-02-02 | Disposition: A | Discharge status: Home / Self Care | Payer: 59 | Attending: Gastroenterology | Admitting: Gastroenterology

## 2021-02-02 ENCOUNTER — Encounter: Payer: Self-pay | Admitting: Gastroenterology

## 2021-02-02 ENCOUNTER — Encounter
Admission: RE | Disposition: A | Discharge status: Home / Self Care | Payer: Self-pay | Source: Home / Self Care | Attending: Gastroenterology

## 2021-02-02 DIAGNOSIS — D500 Iron deficiency anemia secondary to blood loss (chronic): Secondary | ICD-10-CM | POA: Diagnosis not present

## 2021-02-02 DIAGNOSIS — Z8379 Family history of other diseases of the digestive system: Secondary | ICD-10-CM | POA: Diagnosis not present

## 2021-02-02 DIAGNOSIS — J449 Chronic obstructive pulmonary disease, unspecified: Secondary | ICD-10-CM | POA: Insufficient documentation

## 2021-02-02 DIAGNOSIS — M1990 Unspecified osteoarthritis, unspecified site: Secondary | ICD-10-CM | POA: Diagnosis not present

## 2021-02-02 DIAGNOSIS — D125 Benign neoplasm of sigmoid colon: Secondary | ICD-10-CM | POA: Insufficient documentation

## 2021-02-02 DIAGNOSIS — Z7984 Long term (current) use of oral hypoglycemic drugs: Secondary | ICD-10-CM | POA: Insufficient documentation

## 2021-02-02 DIAGNOSIS — Z8349 Family history of other endocrine, nutritional and metabolic diseases: Secondary | ICD-10-CM | POA: Diagnosis not present

## 2021-02-02 DIAGNOSIS — E119 Type 2 diabetes mellitus without complications: Secondary | ICD-10-CM | POA: Insufficient documentation

## 2021-02-02 DIAGNOSIS — Z8249 Family history of ischemic heart disease and other diseases of the circulatory system: Secondary | ICD-10-CM | POA: Insufficient documentation

## 2021-02-02 DIAGNOSIS — I10 Essential (primary) hypertension: Secondary | ICD-10-CM | POA: Diagnosis not present

## 2021-02-02 DIAGNOSIS — K635 Polyp of colon: Secondary | ICD-10-CM | POA: Diagnosis not present

## 2021-02-02 DIAGNOSIS — Z880 Allergy status to penicillin: Secondary | ICD-10-CM | POA: Insufficient documentation

## 2021-02-02 DIAGNOSIS — D509 Iron deficiency anemia, unspecified: Secondary | ICD-10-CM | POA: Diagnosis not present

## 2021-02-02 DIAGNOSIS — Z8042 Family history of malignant neoplasm of prostate: Secondary | ICD-10-CM | POA: Diagnosis not present

## 2021-02-02 DIAGNOSIS — F17210 Nicotine dependence, cigarettes, uncomplicated: Secondary | ICD-10-CM | POA: Insufficient documentation

## 2021-02-02 DIAGNOSIS — Z888 Allergy status to other drugs, medicaments and biological substances status: Secondary | ICD-10-CM | POA: Diagnosis not present

## 2021-02-02 DIAGNOSIS — K449 Diaphragmatic hernia without obstruction or gangrene: Secondary | ICD-10-CM | POA: Insufficient documentation

## 2021-02-02 DIAGNOSIS — Z7951 Long term (current) use of inhaled steroids: Secondary | ICD-10-CM | POA: Diagnosis not present

## 2021-02-02 DIAGNOSIS — Z803 Family history of malignant neoplasm of breast: Secondary | ICD-10-CM | POA: Diagnosis not present

## 2021-02-02 DIAGNOSIS — D126 Benign neoplasm of colon, unspecified: Secondary | ICD-10-CM | POA: Diagnosis not present

## 2021-02-02 DIAGNOSIS — Z801 Family history of malignant neoplasm of trachea, bronchus and lung: Secondary | ICD-10-CM | POA: Diagnosis not present

## 2021-02-02 DIAGNOSIS — Z79899 Other long term (current) drug therapy: Secondary | ICD-10-CM | POA: Diagnosis not present

## 2021-02-02 HISTORY — PX: COLONOSCOPY WITH PROPOFOL: SHX5780

## 2021-02-02 HISTORY — PX: ESOPHAGOGASTRODUODENOSCOPY (EGD) WITH PROPOFOL: SHX5813

## 2021-02-02 LAB — GLUCOSE, CAPILLARY: Glucose-Capillary: 97 mg/dL (ref 70–99)

## 2021-02-02 SURGERY — COLONOSCOPY WITH PROPOFOL
Anesthesia: General

## 2021-02-02 MED ORDER — SODIUM CHLORIDE 0.9 % IV SOLN: INTRAVENOUS | Status: DC

## 2021-02-02 MED ORDER — LIDOCAINE HCL (CARDIAC) PF 100 MG/5ML IV SOSY
PREFILLED_SYRINGE | INTRAVENOUS | Status: DC | PRN
  Administered 2021-02-02: 40 mg via INTRAVENOUS

## 2021-02-02 MED ORDER — PROPOFOL 10 MG/ML IV BOLUS
INTRAVENOUS | Status: DC | PRN
  Administered 2021-02-02: 80 mg via INTRAVENOUS

## 2021-02-02 MED ORDER — LIDOCAINE HCL (PF) 2 % IJ SOLN
INTRAMUSCULAR | Status: AC
  Filled 2021-02-02: qty 2

## 2021-02-02 MED ORDER — PROPOFOL 500 MG/50ML IV EMUL
INTRAVENOUS | Status: DC | PRN
  Administered 2021-02-02: 125 ug/kg/min via INTRAVENOUS

## 2021-02-02 MED ORDER — PROPOFOL 500 MG/50ML IV EMUL
INTRAVENOUS | Status: AC
  Filled 2021-02-02: qty 50

## 2021-02-02 NOTE — Op Note (Signed)
[facility] Patient Name: Monica Oliver Procedure Date: 02/02/2021 9:28 AM MRN: [id number] Account #: [id number] Date of Birth: 1951/07/17 Admit Type: Outpatient Age: 70 Room: [facility] END[facility] ROOM 4 Gender: Female Note Status: Finalized Procedure:             Colonoscopy Indications:           Iron deficiency anemia Providers:             [name] MD, MD Medicines:             Propofol per Anesthesia Complications:         No immediate complications. Procedure:             Pre-Anesthesia Assessment:                        - Prior to the procedure, a History and Physical was                         performed, and patient medications and allergies were                         reviewed. The patient's tolerance of previous                         anesthesia was also reviewed. The risks and benefits                         of the procedure and the sedation options and risks                         were discussed with the patient. All questions were                         answered, and informed consent was obtained. Prior                         Anticoagulants: The patient has taken no previous                         anticoagulant or antiplatelet agents. ASA Grade                         Assessment: II - A patient with mild systemic disease.                         After reviewing the risks and benefits, the patient                         was deemed in satisfactory condition to undergo the                         procedure.                        After obtaining informed consent, the colonoscope was                         passed under direct vision. Throughout the procedure,  the patient's blood pressure, pulse, and oxygen                         saturations were monitored continuously. The                         Colonoscope was introduced through the anus and                         advanced to the the cecum, identified by  appendiceal                         orifice and ileocecal valve. The colonoscopy was                         performed without difficulty. The patient tolerated                         the procedure well. The quality of the bowel                         preparation was excellent. Findings:      The perianal and digital rectal examinations were normal.      Five sessile polyps were found in the sigmoid colon. The polyps were 2       to 7 mm in size. These polyps were removed with a cold snare. Resection       and retrieval were complete.      A 2 mm polyp was found in the ascending colon. The polyp was sessile.       The polyp was removed with a cold snare. Resection was complete, but the       polyp tissue was not retrieved. Impression:            - Five 2 to 7 mm polyps in the sigmoid colon, removed                         with a cold snare. Resected and retrieved.                        - One 2 mm polyp in the ascending colon, removed with                         a cold snare. Complete resection. Polyp tissue not                         retrieved. Recommendation:        - Discharge patient to home.                        - Resume previous diet.                        - Continue present medications.                        - Await pathology results.                        - Repeat colonoscopy in 5 years  for surveillance if                         adenomatous.                        - To visualize the small bowel, perform video capsule                         endoscopy as previously scheduled. Procedure Code(s):     --- Professional ---                        [phone], Colonoscopy, flexible; with removal of                         tumor(s), polyp(s), or other lesion(s) by snare                         technique Diagnosis Code(s):     --- Professional ---                        D50.9, Iron deficiency anemia, unspecified                        K63.5, Polyp of colon CPT copyright 2019  American Medical Association. All rights reserved. The codes documented in this report are preliminary and upon coder review may  be revised to meet current compliance requirements. [name] MD, MD 02/02/2021 10:04:13 AM This report has been signed electronically. Number of Addenda: 0 Note Initiated On: 02/02/2021 9:28 AM Scope Withdrawal Time: 0 hours 10 minutes 23 seconds  Total Procedure Duration: 0 hours 16 minutes 10 seconds  Estimated Blood Loss:  Estimated blood loss: none.      [facility]

## 2021-02-02 NOTE — Anesthesia Postprocedure Evaluation (Signed)
Anesthesia Post Note  Patient: Monica Oliver  Procedure(s) Performed: COLONOSCOPY WITH PROPOFOL (N/A ) ESOPHAGOGASTRODUODENOSCOPY (EGD) WITH PROPOFOL (N/A )  Patient location during evaluation: Endoscopy Anesthesia Type: General Level of consciousness: awake and alert and oriented Pain management: pain level controlled Vital Signs Assessment: post-procedure vital signs reviewed and stable Respiratory status: spontaneous breathing Cardiovascular status: blood pressure returned to baseline Anesthetic complications: no   No complications documented.   Last Vitals:  Vitals:   02/02/21 1014 02/02/21 1034  BP: 118/65 (!) 152/75  Pulse: 64 64  Resp: 14 17  Temp:    SpO2: 100% 100%    Last Pain:  Vitals:   02/02/21 1034  TempSrc:   PainSc: 0-No pain                 Nathan Moctezuma

## 2021-02-02 NOTE — Transfer of Care (Signed)
Immediate Anesthesia Transfer of Care Note  Patient: Monica Oliver  Procedure(s) Performed: COLONOSCOPY WITH PROPOFOL (N/A ) ESOPHAGOGASTRODUODENOSCOPY (EGD) WITH PROPOFOL (N/A )  Patient Location: PACU  Anesthesia Type:MAC  Level of Consciousness: awake and drowsy  Airway & Oxygen Therapy: Patient Spontanous Breathing  Post-op Assessment: Report given to RN and Post -op Vital signs reviewed and stable  Post vital signs: stable  Last Vitals:  Vitals Value Taken Time  BP    Temp 36.2 C 02/02/21 1004  Pulse 68 02/02/21 1004  Resp 19 02/02/21 1004  SpO2 96 % 02/02/21 1004    Last Pain:  Vitals:   02/02/21 1004  TempSrc: Temporal  PainSc: Asleep         Complications: No complications documented.

## 2021-02-02 NOTE — Anesthesia Preprocedure Evaluation (Signed)
Anesthesia Evaluation  Patient identified by MRN, date of birth, ID band Patient awake    Reviewed: Allergy & Precautions, NPO status , Patient's Chart, lab work & pertinent test results  Airway Mallampati: III       Dental   Pulmonary asthma , sleep apnea , COPD, Current Smoker,    Pulmonary exam normal        Cardiovascular hypertension, + angina + CAD and + DOE  Normal cardiovascular exam     Neuro/Psych negative neurological ROS  negative psych ROS   GI/Hepatic Neg liver ROS, GERD  ,  Endo/Other  diabetes  Renal/GU negative Renal ROS  negative genitourinary   Musculoskeletal  (+) Arthritis ,   Abdominal Normal abdominal exam  (+)   Peds negative pediatric ROS (+)  Hematology negative hematology ROS (+)   Anesthesia Other Findings Past Medical History: No date: Allergy     Comment:  environmental No date: Arthritis No date: Asthma No date: CAD (coronary artery disease)     Comment:  a. 08/2018 Cardiac CTA: CAC score 306 (91st %'ile). LM               nl, LAD nl, LCX small w/ <50% mid stenosis. RI nl, RCA               <50% mid, 51-69% distal (CTFFR of rPL 0.52); b. 12/2020               Cath: LM nl, LAD nl, D2 20, LCX nl, RCA 20p/m, RPAV 85.               EF 55-65%-->Med Rx. No date: Claudication [facility])     Comment:  a. 08/2019 ABI's nl. No date: COPD (chronic obstructive pulmonary disease) (HCC) No date: Diabetes mellitus without complication (HCC) No date: Diastolic dysfunction     Comment:  a. 10/2018 Echo: EF 55-60%, Gr1 DD. Nl RV size/fxn. No date: GERD (gastroesophageal reflux disease) No date: Hyperlipidemia No date: Hypertension No date: Sleep apnea     Comment:  C-Pap machine No date: Tobacco abuse  Reproductive/Obstetrics                             Anesthesia Physical Anesthesia Plan  ASA: III  Anesthesia Plan: General   Post-op Pain Management:    Induction:  Intravenous  PONV Risk Score and Plan:   Airway Management Planned: Nasal Cannula  Additional Equipment:   Intra-op Plan:   Post-operative Plan:   Informed Consent: I have reviewed the patients History and Physical, chart, labs and discussed the procedure including the risks, benefits and alternatives for the proposed anesthesia with the patient or authorized representative who has indicated his/her understanding and acceptance.     Dental advisory given  Plan Discussed with: CRNA and Surgeon  Anesthesia Plan Comments:         Anesthesia Quick Evaluation

## 2021-02-02 NOTE — Op Note (Signed)
[facility] Patient Name: Monica Oliver Procedure Date: 02/02/2021 9:28 AM MRN: [id number] Account #: [id number] Date of Birth: 03-Sep-1951 Admit Type: Outpatient Age: 70 Room: [facility] ENDO ROOM 4 Gender: Female Note Status: Finalized Procedure:             Upper GI endoscopy Indications:           Iron deficiency anemia Providers:             [name] MD, MD Medicines:             Propofol per Anesthesia Complications:         No immediate complications. Procedure:             Pre-Anesthesia Assessment:                        - Prior to the procedure, a History and Physical was                         performed, and patient medications and allergies were                         reviewed. The patient's tolerance of previous                         anesthesia was also reviewed. The risks and benefits                         of the procedure and the sedation options and risks                         were discussed with the patient. All questions were                         answered, and informed consent was obtained. Prior                         Anticoagulants: The patient has taken no previous                         anticoagulant or antiplatelet agents. ASA Grade                         Assessment: II - A patient with mild systemic disease.                         After reviewing the risks and benefits, the patient                         was deemed in satisfactory condition to undergo the                         procedure.                        After obtaining informed consent, the endoscope was                         passed under direct vision. Throughout the procedure,  the patient's blood pressure, pulse, and oxygen                         saturations were monitored continuously. The Endoscope                         was introduced through the mouth, and advanced to the                         second part of duodenum.  The upper GI endoscopy was                         accomplished without difficulty. The patient tolerated                         the procedure well. Findings:      A small hiatal hernia was present.      The stomach was normal.      The examined duodenum was normal. Impression:            - Small hiatal hernia.                        - Normal stomach.                        - Normal examined duodenum.                        - No specimens collected. Recommendation:        - Discharge patient to home.                        - Resume previous diet.                        - Continue present medications.                        - Perform a colonoscopy today. Procedure Code(s):     --- Professional ---                        [phone], Esophagogastroduodenoscopy, flexible,                         transoral; diagnostic, including collection of                         specimen(s) by brushing or washing, when performed                         (separate procedure) Diagnosis Code(s):     --- Professional ---                        D50.9, Iron deficiency anemia, unspecified CPT copyright 2019 American Medical Association. All rights reserved. The codes documented in this report are preliminary and upon coder review may  be revised to meet current compliance requirements. [name] MD, MD 02/02/2021 9:42:51 AM This report has been signed electronically. Number of Addenda: 0 Note Initiated On: 02/02/2021 9:28 AM Estimated Blood Loss:  Estimated blood  loss: none.      [facility]

## 2021-02-02 NOTE — H&P (Signed)
[name], MD [address] [place],  [address] Phone:[phone] Fax : [phone]  Primary Care Physician:  Monica Brock, MD Primary Gastroenterologist:  Dr. Allen Norris  Pre-Procedure History & Physical: HPI:  Monica Oliver is a 70 y.o. female is here for an endoscopy and colonoscopy.   Past Medical History:  Diagnosis Date  . Allergy    environmental  . Arthritis   . Asthma   . CAD (coronary artery disease)    a. 08/2018 Cardiac CTA: CAC score 306 (91st %'ile). LM nl, LAD nl, LCX small w/ <50% mid stenosis. RI nl, RCA <50% mid, 51-69% distal (CTFFR of rPL 0.52); b. 12/2020 Cath: LM nl, LAD nl, D2 20, LCX nl, RCA 20p/m, RPAV 85. EF 55-65%-->Med Rx.  . Claudication ([place])    a. 08/2019 ABI's nl.  . COPD (chronic obstructive pulmonary disease) ([place])   . Diabetes mellitus without complication ([place])   . Diastolic dysfunction    a. 10/2018 Echo: EF 55-60%, Gr1 DD. Nl RV size/fxn.  Marland Kitchen GERD (gastroesophageal reflux disease)   . Hyperlipidemia   . Hypertension   . Sleep apnea    C-Pap machine  . Tobacco abuse     Past Surgical History:  Procedure Laterality Date  . CARDIAC CATHETERIZATION    . CESAREAN SECTION     1986  . CHOLECYSTECTOMY     1993  . LEFT HEART CATH AND CORONARY ANGIOGRAPHY Left 12/29/2020   Procedure: LEFT HEART CATH AND CORONARY ANGIOGRAPHY;  Surgeon: [name], MD;  Location: [facility];  Service: Cardiovascular;  Laterality: Left;    Prior to Admission medications   Medication Sig Start Date End Date Taking? Authorizing Provider  albuterol (VENTOLIN HFA) 108 (90 Base) MCG/ACT inhaler INHALE 2 PUFFS BY MOUTH EVERY 4 HOURS AS NEEDED 08/14/20 08/14/21 Yes Kasa, Kurian, MD  amLODipine (NORVASC) 2.5 MG tablet TAKE 1 TABLET BY MOUTH DAILY. 07/14/20 07/14/21 Yes End, Harrell Gave, MD  aspirin 81 MG tablet Take 81 mg by mouth daily.   Yes [provider]  budesonide-formoterol (SYMBICORT) 160-4.5 MCG/ACT inhaler INHALE 2  PUFFS BY MOUTH TWO TIMES DAILY 02/12/20 02/27/21 Yes Magdalen Spatz, NP  cetirizine (ZYRTEC) 10 MG tablet Take 10 mg by mouth daily as needed for allergies.   Yes [provider]  cholecalciferol (VITAMIN D3) 25 MCG (1000 UNIT) tablet Take 1,000 Units by mouth daily.   Yes [provider]  ezetimibe (ZETIA) 10 MG tablet TAKE 1 TABLET BY MOUTH ONCE A DAY 05/11/20 05/11/21 Yes Monica Brock, MD  ferrous sulfate 325 (65 FE) MG tablet TAKE 1 TABLET BY MOUTH EVERY OTHER DAY FOR ANEMIA (SPANISH) 12/17/20 12/17/21 Yes Llamas, Jewel C, MD  fluticasone (FLONASE) 50 MCG/ACT nasal spray PLACE 1 SPRAY INTO BOTH NOSTRILS DAILY AS NEEDED 02/12/20 02/11/21 Yes Magdalen Spatz, NP  gabapentin (NEURONTIN) 300 MG capsule Take 300 mg by mouth 2 (two) times daily.   Yes [provider]  lisinopril-hydrochlorothiazide (ZESTORETIC) 20-12.5 MG tablet TAKE 1 TABLET BY MOUTH DAILY. 10/21/20  Yes End, Harrell Gave, MD  metFORMIN (GLUCOPHAGE-XR) 500 MG 24 hr tablet Take 2 tablets by mouth twice a day for diabetes 10/22/20  Yes   metoprolol tartrate (LOPRESSOR) 25 MG tablet Take 2 tablets (50 mg total) by mouth 2 (two) times daily. 01/26/21 04/26/21 Yes End, Harrell Gave, MD  omeprazole (PRILOSEC) 20 MG capsule Take 20 mg by mouth daily as needed (acid reflux).   Yes [provider]  zinc gluconate 50 MG  tablet Take 50 mg by mouth daily.   Yes [provider]  albuterol (PROVENTIL) (2.5 MG/3ML) 0.083% nebulizer solution TAKE 3 MLS (2.5 MG TOTAL) BY NEBULIZATION EVERY 6 HOURS AS NEEDED FOR WHEEZING OR SHORTNESS OF BREATH. 12/25/20   Flora Lipps, MD  celecoxib (CELEBREX) 200 MG capsule TAKE 1 CAPSULE BY MOUTH ONCE DAILY Patient not taking: Reported on 01/28/2021 04/09/20 04/09/21  Dimmig, Marcello Moores, MD  glucose blood (FREESTYLE LITE) test strip Test once a day as directed Patient taking differently: Test once a day as directed 02/21/20     hydrocortisone cream 1 % Apply to affected area 2 times daily 01/28/21    Sharion Balloon, NP  nitroGLYCERIN (NITROSTAT) 0.4 MG SL tablet Place 1 tablet (0.4 mg total) under the tongue every 5 (five) minutes as needed for chest pain. Maximum of 3 doses. 01/16/20   End, Harrell Gave, MD  Tiotropium Bromide Monohydrate 1.25 MCG/ACT AERS INHALE 2 PUFFS INTO THE LUNGS DAILY. 02/12/20 02/27/21  Magdalen Spatz, NP  TRUE METRIX BLOOD GLUCOSE TEST test strip  02/19/16   [provider]    Allergies as of 01/26/2021 - Review Complete 01/26/2021  Allergen Reaction Noted  . Statins  01/13/2021  . Penicillins Other (See Comments) 12/15/2014    Family History  Problem Relation Age of Onset  . Hypertension Mother   . Cancer Mother        lung  . Cancer Father        prostate  . Hyperlipidemia Father   . Heart attack Father 45  . Cancer Brother        prostate  . Cirrhosis Sister   . Hepatitis Sister   . Breast cancer Paternal Aunt     Social History   Socioeconomic History  . Marital status: Married    Spouse name: Not on file  . Number of children: 3  . Years of education: Not on file  . Highest education level: Not on file  Occupational History  . Occupation: CNA  Tobacco Use  . Smoking status: Current Every Day Smoker    Packs/day: 0.75    Years: 40.00    Pack years: 30.00    Types: Cigarettes    Start date: 12/15/1971  . Smokeless tobacco: Never Used  . Tobacco comment: 1 cig daily--01/26/2021  Vaping Use  . Vaping Use: Never used  Substance and Sexual Activity  . Alcohol use: No    Alcohol/week: 0.0 standard drinks  . Drug use: No  . Sexual activity: Never  Other Topics Concern  . Not on file  Social History Narrative  . Not on file   Social Determinants of Health   Financial Resource Strain: Not on file  Food Insecurity: Not on file  Transportation Needs: Not on file  Physical Activity: Not on file  Stress: Not on file  Social Connections: Not on file  Intimate Partner Violence: Not on file    Review of Systems: See HPI,  otherwise negative ROS  Physical Exam: BP (!) 145/65   Pulse 71   Temp (!) 97.5 F (36.4 C) (Temporal)   Resp 16   Ht 5\' 3"  (1.6 m)   Wt 78.9 kg   SpO2 100%   BMI 30.82 kg/m  General:   Alert,  pleasant and cooperative in NAD Head:  Normocephalic and atraumatic. Neck:  Supple; no masses or thyromegaly. Lungs:  Clear throughout to auscultation.    Heart:  Regular rate and rhythm. Abdomen:  Soft, nontender and  nondistended. Normal bowel sounds, without guarding, and without rebound.   Neurologic:  Alert and  oriented x4;  grossly normal neurologically.  Impression/Plan: Monica Oliver is here for an endoscopy and colonoscopy to be performed for IDA  Risks, benefits, limitations, and alternatives regarding  endoscopy and colonoscopy have been reviewed with the patient.  Questions have been answered.  All parties agreeable.   [name], MD  02/02/2021, 9:28 AM

## 2021-02-03 ENCOUNTER — Encounter: Payer: Self-pay | Admitting: Gastroenterology

## 2021-02-03 LAB — SURGICAL PATHOLOGY

## 2021-02-04 ENCOUNTER — Encounter: Payer: Self-pay | Admitting: Gastroenterology

## 2021-02-06 DIAGNOSIS — M545 Low back pain, unspecified: Secondary | ICD-10-CM | POA: Diagnosis not present

## 2021-02-15 ENCOUNTER — Other Ambulatory Visit: Payer: Self-pay | Admitting: Acute Care

## 2021-02-15 ENCOUNTER — Other Ambulatory Visit: Payer: Self-pay

## 2021-02-15 MED ORDER — FLUTICASONE PROPIONATE 50 MCG/ACT NA SUSP
1.0000 | Freq: Every day | NASAL | 12 refills | Status: DC | PRN
  Filled 2021-02-15: qty 16, 60d supply, fill #0
  Filled 2021-04-28: qty 16, 60d supply, fill #1
  Filled 2021-06-22: qty 16, 60d supply, fill #2
  Filled 2021-08-13: qty 16, 60d supply, fill #3
  Filled 2021-11-04: qty 16, 60d supply, fill #4
  Filled 2022-01-17: qty 16, 60d supply, fill #5

## 2021-02-15 MED FILL — Albuterol Sulfate Inhal Aero 108 MCG/ACT (90MCG Base Equiv): RESPIRATORY_TRACT | 25 days supply | Qty: 18 | Fill #0 | Status: AC

## 2021-02-16 ENCOUNTER — Other Ambulatory Visit: Payer: Self-pay

## 2021-02-16 MED FILL — Lisinopril & Hydrochlorothiazide Tab 20-12.5 MG: ORAL | 90 days supply | Qty: 90 | Fill #0 | Status: AC

## 2021-03-03 ENCOUNTER — Other Ambulatory Visit: Payer: Self-pay

## 2021-03-06 DIAGNOSIS — M545 Low back pain, unspecified: Secondary | ICD-10-CM | POA: Diagnosis not present

## 2021-03-09 ENCOUNTER — Other Ambulatory Visit: Payer: Self-pay

## 2021-03-09 ENCOUNTER — Ambulatory Visit: Payer: 59 | Attending: Internal Medicine

## 2021-03-09 DIAGNOSIS — Z23 Encounter for immunization: Secondary | ICD-10-CM

## 2021-03-09 MED ORDER — OMEPRAZOLE 20 MG PO CPDR
DELAYED_RELEASE_CAPSULE | ORAL | 3 refills | Status: DC
  Filled 2021-03-09: qty 90, 90d supply, fill #0

## 2021-03-09 MED ORDER — PFIZER-BIONT COVID-19 VAC-TRIS 30 MCG/0.3ML IM SUSP
INTRAMUSCULAR | 0 refills | Status: DC
  Filled 2021-03-09: qty 0.3, 15d supply, fill #0

## 2021-03-09 NOTE — Progress Notes (Signed)
   Covid-19 Vaccination Clinic  Name:  Monica Oliver    MRN: [id number] DOB: 12/11/1950  03/09/2021  [name] was observed post Covid-19 immunization for 15 minutes without incident. She was provided with Vaccine Information Sheet and instruction to access the V-Safe system.   [name] was instructed to call 911 with any severe reactions post vaccine: Difficulty breathing  Swelling of face and throat  A fast heartbeat  A bad rash all over body  Dizziness and weakness   Immunizations Administered     Name Date Dose VIS Date Route   PFIZER Comrnaty(Gray TOP) Covid-19 Vaccine 03/09/2021  2:50 PM 0.3 mL 09/03/2020 Intramuscular   Manufacturer: [employer]   Lot: [id number]   NDC: [phone]

## 2021-03-10 ENCOUNTER — Other Ambulatory Visit: Payer: Self-pay

## 2021-03-18 ENCOUNTER — Other Ambulatory Visit: Payer: Self-pay

## 2021-03-18 ENCOUNTER — Other Ambulatory Visit: Payer: Self-pay | Admitting: Internal Medicine

## 2021-03-18 ENCOUNTER — Other Ambulatory Visit: Payer: Self-pay | Admitting: Acute Care

## 2021-03-18 MED ORDER — BUDESONIDE-FORMOTEROL FUMARATE 160-4.5 MCG/ACT IN AERO
INHALATION_SPRAY | RESPIRATORY_TRACT | 5 refills | Status: DC
  Filled 2021-03-18: qty 10.2, 30d supply, fill #0
  Filled 2021-07-20: qty 10.2, 30d supply, fill #1

## 2021-03-18 MED ORDER — SPIRIVA RESPIMAT 1.25 MCG/ACT IN AERS
2.0000 | INHALATION_SPRAY | Freq: Every day | RESPIRATORY_TRACT | 5 refills | Status: DC
  Filled 2021-03-18: qty 4, 30d supply, fill #0
  Filled 2021-06-03: qty 4, 30d supply, fill #1
  Filled 2021-07-26: qty 4, 30d supply, fill #2
  Filled 2021-09-23: qty 4, 30d supply, fill #3
  Filled 2021-11-04: qty 4, 30d supply, fill #4
  Filled 2021-12-21 (×2): qty 4, 30d supply, fill #5

## 2021-03-18 MED FILL — Amlodipine Besylate Tab 2.5 MG (Base Equivalent): ORAL | 90 days supply | Qty: 90 | Fill #0 | Status: CN

## 2021-03-18 MED FILL — Ezetimibe Tab 10 MG: ORAL | 90 days supply | Qty: 90 | Fill #0 | Status: AC

## 2021-03-18 MED FILL — Celecoxib Cap 200 MG: ORAL | 30 days supply | Qty: 30 | Fill #0 | Status: AC

## 2021-03-19 ENCOUNTER — Other Ambulatory Visit: Payer: Self-pay

## 2021-03-20 DIAGNOSIS — M545 Low back pain, unspecified: Secondary | ICD-10-CM | POA: Diagnosis not present

## 2021-03-25 ENCOUNTER — Other Ambulatory Visit: Payer: Self-pay

## 2021-03-25 MED ORDER — GLUCOSE BLOOD VI STRP
ORAL_STRIP | 11 refills | Status: DC
  Filled 2021-03-25: qty 100, 90d supply, fill #0

## 2021-03-25 MED FILL — Albuterol Sulfate Inhal Aero 108 MCG/ACT (90MCG Base Equiv): RESPIRATORY_TRACT | 25 days supply | Qty: 18 | Fill #1 | Status: AC

## 2021-03-26 ENCOUNTER — Other Ambulatory Visit: Payer: Self-pay

## 2021-03-26 ENCOUNTER — Encounter: Payer: Self-pay | Admitting: Internal Medicine

## 2021-03-26 ENCOUNTER — Telehealth (INDEPENDENT_AMBULATORY_CARE_PROVIDER_SITE_OTHER): Payer: BC Managed Care – PPO | Admitting: Internal Medicine

## 2021-03-26 VITALS — BP 142/70 | HR 90 | Ht 63.0 in | Wt 170.0 lb

## 2021-03-26 DIAGNOSIS — D509 Iron deficiency anemia, unspecified: Secondary | ICD-10-CM | POA: Diagnosis not present

## 2021-03-26 DIAGNOSIS — I25118 Atherosclerotic heart disease of native coronary artery with other forms of angina pectoris: Secondary | ICD-10-CM

## 2021-03-26 DIAGNOSIS — I10 Essential (primary) hypertension: Secondary | ICD-10-CM | POA: Diagnosis not present

## 2021-03-26 DIAGNOSIS — E1169 Type 2 diabetes mellitus with other specified complication: Secondary | ICD-10-CM

## 2021-03-26 DIAGNOSIS — E785 Hyperlipidemia, unspecified: Secondary | ICD-10-CM

## 2021-03-26 DIAGNOSIS — M25511 Pain in right shoulder: Secondary | ICD-10-CM | POA: Diagnosis not present

## 2021-03-26 MED ORDER — NITROGLYCERIN 0.4 MG SL SUBL
0.4000 mg | SUBLINGUAL_TABLET | SUBLINGUAL | 1 refills | Status: DC | PRN
  Filled 2021-03-26: qty 25, 8d supply, fill #0

## 2021-03-26 MED ORDER — AMLODIPINE BESYLATE 2.5 MG PO TABS
2.5000 mg | ORAL_TABLET | Freq: Every day | ORAL | 3 refills | Status: DC
  Filled 2021-03-26 – 2021-04-27 (×2): qty 30, 30d supply, fill #0
  Filled 2021-05-17: qty 30, 30d supply, fill #1
  Filled 2021-06-20 – 2021-06-21 (×2): qty 30, 30d supply, fill #2
  Filled 2021-07-15: qty 30, 30d supply, fill #3

## 2021-03-26 MED ORDER — TIZANIDINE HCL 2 MG PO TABS
ORAL_TABLET | ORAL | 0 refills | Status: DC
  Filled 2021-03-26: qty 20, 5d supply, fill #0

## 2021-03-26 NOTE — Patient Instructions (Addendum)
Medication Instructions:   Your physician has recommended you make the following change in your medication:   1) CONTINUE Metoprolol Tartrate 25mg  TWICE daily  2) RESTART Amlodipine 2.5mg  DAILY  *If you need a refill on your cardiac medications before your next appointment, please call your pharmacy*   Lab Work:  -  Your physician recommends that you return for lab work at your convenience: CBC w/o diff  -  [facility] to the [facility]. You will check in at the front desk to the right as you walk into the atrium. Valet Parking is offered if needed.  - No appointment needed. You may go any day between 7 am and 6 pm.    Testing/Procedures:  None ordered   Follow-Up: At [facility], you and your health needs are our priority.  As part of our continuing mission to provide you with exceptional heart care, we have created designated Provider Care Teams.  These Care Teams include your primary Cardiologist (physician) and Advanced Practice Providers (APPs -  Physician Assistants and Nurse Practitioners) who all work together to provide you with the care you need, when you need it.  We recommend signing up for the patient portal called "MyChart".  Sign up information is provided on this After Visit Summary.  MyChart is used to connect with patients for Virtual Visits (Telemedicine).  Patients are able to view lab/test results, encounter notes, upcoming appointments, etc.  Non-urgent messages can be sent to your provider as well.   To learn more about what you can do with MyChart, go to [email or web address].    Your next appointment:   3 month(s)  The format for your next appointment:   In Person  Provider:   You may see [name], MD or one of the following Advanced Practice Providers on your designated Care Team:   Murray Hodgkins, NP Christell Faith, PA-C Marrianne Mood, PA-C Cadence Kathlen Mody, [place] Laurann Montana, NP   Other Instructions  Your provider  recommends that you reach out to GI to discuss follow up with them and possible capsule endoscopy previously recommended by Dr. Allen Norris.

## 2021-03-26 NOTE — Progress Notes (Signed)
Virtual Visit via Video Note   This visit type was conducted due to national recommendations for restrictions regarding the COVID-19 Pandemic (e.g. social distancing) in an effort to limit this patient's exposure and mitigate transmission in our community.  Due to her co-morbid illnesses, this patient is at least at moderate risk for complications without adequate follow up.  This format is felt to be most appropriate for this patient at this time.  All issues noted in this document were discussed and addressed.  A limited physical exam was performed with this format.  Please refer to the patient's chart for her consent to telehealth for [facility].       Date:  03/26/2021   ID:  Ruthe Mannan, DOB [birth year], MRN [id number] The patient was identified using 2 identifiers.  Patient Location: Other:  Work Provider Location: Office/Clinic   PCP:  Rejeana Brock, MD   [facility] Providers Cardiologist:  [name], MD     Evaluation Performed:  Follow-Up Visit  Chief Complaint:  Follow-up coronary artery disease  History of Present Illness:    CHEMERE STEFFLER is a 70 y.o. female with history of single-vessel coronary artery disease with stable angina, hypertension, hyperlipidemia, type 2 diabetes mellitus, iron deficiency anemia, COPD, obstructive sleep apnea, shingles, and tobacco use.  We are speaking today for follow-up of her stable angina.  She was last seen in our office by Ignacia Bayley, NP, in late April after preceding catheterization confirmed severe single-vessel coronary artery disease involving the rPAV.  Preceding labs were notable for worsening iron deficiency anemia.  Stent placement was not pursued pending further work-up of her anemia.  No obvious cause for blood loss was seen on upper and lower endoscopies in May.  Capsule endoscopy was recommended.  Ms. Kellenberger has not been able to follow-up with GI nor have arrangements been made for capsule endoscopy.  Today, Ms.  Grudzien reports feeling well, though she notes that her blood pressure has been running a bit high.  She stopped taking amlodipine a few days ago, as she noticed that it was not on her medication list (it is unclear why it was discontinued).  Ms. Babinski is also on metoprolol tartrate 25 mg BID (this has been prescribed as 50 mg BID).  Despite this, she has not had any chest pain, shortness of breath, palpitations, or lightheadedness.  She denies bleeding.   Past Medical History:  Diagnosis Date   Allergy    environmental   Arthritis    Asthma    CAD (coronary artery disease)    a. 08/2018 Cardiac CTA: CAC score 306 (91st %'ile). LM nl, LAD nl, LCX small w/ <50% mid stenosis. RI nl, RCA <50% mid, 51-69% distal (CTFFR of rPL 0.52); b. 12/2020 Cath: LM nl, LAD nl, D2 20, LCX nl, RCA 20p/m, RPAV 85. EF 55-65%-->Med Rx.   Claudication ([place])    a. 08/2019 ABI's nl.   COPD (chronic obstructive pulmonary disease) (HCC)    Diabetes mellitus without complication (HCC)    Diastolic dysfunction    a. 10/2018 Echo: EF 55-60%, Gr1 DD. Nl RV size/fxn.   GERD (gastroesophageal reflux disease)    Hyperlipidemia    Hypertension    Sleep apnea    C-Pap machine   Tobacco abuse    Past Surgical History:  Procedure Laterality Date   [place]  WITH PROPOFOL N/A 02/02/2021   Procedure: COLONOSCOPY WITH PROPOFOL;  Surgeon: [name], MD;  Location: [facility] ENDOSCOPY;  Service: Endoscopy;  Laterality: N/A;   ESOPHAGOGASTRODUODENOSCOPY (EGD) WITH PROPOFOL N/A 02/02/2021   Procedure: ESOPHAGOGASTRODUODENOSCOPY (EGD) WITH PROPOFOL;  Surgeon: [name], MD;  Location: [facility] ENDOSCOPY;  Service: Endoscopy;  Laterality: N/A;   LEFT HEART CATH AND CORONARY ANGIOGRAPHY Left 12/29/2020   Procedure: LEFT HEART CATH AND CORONARY ANGIOGRAPHY;  Surgeon: [name], MD;  Location: [facility];  Service: Cardiovascular;   Laterality: Left;     Current Meds  Medication Sig   albuterol (PROVENTIL) (2.5 MG/3ML) 0.083% nebulizer solution TAKE 3 MLS (2.5 MG TOTAL) BY NEBULIZATION EVERY 6 HOURS AS NEEDED FOR WHEEZING OR SHORTNESS OF BREATH.   albuterol (VENTOLIN HFA) 108 (90 Base) MCG/ACT inhaler INHALE 2 PUFFS BY MOUTH EVERY 4 HOURS AS NEEDED   aspirin 81 MG tablet Take 81 mg by mouth daily.   budesonide-formoterol (SYMBICORT) 160-4.5 MCG/ACT inhaler INHALE 2 PUFFS BY MOUTH TWO TIMES DAILY   cetirizine (ZYRTEC) 10 MG tablet Take 10 mg by mouth daily as needed for allergies.   cholecalciferol (VITAMIN D3) 25 MCG (1000 UNIT) tablet Take 1,000 Units by mouth daily.   ezetimibe (ZETIA) 10 MG tablet TAKE 1 TABLET BY MOUTH ONCE A DAY   ferrous sulfate 325 (65 FE) MG tablet TAKE 1 TABLET BY MOUTH EVERY OTHER DAY FOR ANEMIA (SPANISH)   fluticasone (FLONASE) 50 MCG/ACT nasal spray Place 1 spray into both nostrils daily as needed for allergies or rhinitis.   gabapentin (NEURONTIN) 300 MG capsule Take 300 mg by mouth 2 (two) times daily.   glucose blood (FREESTYLE LITE) test strip Test once a day as directed   hydrocortisone cream 1 % Apply 1 application topically 2 (two) times daily as needed for itching.   lisinopril-hydrochlorothiazide (ZESTORETIC) 20-12.5 MG tablet TAKE 1 TABLET BY MOUTH DAILY.   metFORMIN (GLUCOPHAGE-XR) 500 MG 24 hr tablet Take 2 tablets by mouth twice a day for diabetes   METOPROLOL TARTRATE PO Take 25 mg by mouth in the morning and at bedtime.   nitroGLYCERIN (NITROSTAT) 0.4 MG SL tablet Place 1 tablet (0.4 mg total) under the tongue every 5 (five) minutes as needed for chest pain. Maximum of 3 doses.   omeprazole (PRILOSEC) 20 MG capsule Take 20 mg by mouth daily as needed (acid reflux).   Tiotropium Bromide Monohydrate (SPIRIVA RESPIMAT) 1.25 MCG/ACT AERS INHALE 2 PUFFS INTO THE LUNGS DAILY.   TRUE METRIX BLOOD GLUCOSE TEST test strip    zinc gluconate 50 MG tablet Take 50 mg by mouth daily.      Allergies:   Penicillins and Statins   Social History   Tobacco Use   Smoking status: Every Day    Packs/day: 0.75    Years: 40.00    Pack years: 30.00    Types: Cigarettes    Start date: 12/15/1971   Smokeless tobacco: Never   Tobacco comments:    1 cig daily--01/26/2021  Vaping Use   Vaping Use: Never used  Substance Use Topics   Alcohol use: No    Alcohol/week: 0.0 standard drinks   Drug use: No     Family Hx: The patient's family history includes Breast cancer in her paternal aunt; Cancer in her brother, father, and mother; Cirrhosis in her sister; Heart attack (age of onset: 50) in her father; Hepatitis in her sister; Hyperlipidemia in her father; Hypertension in her mother.  ROS:   Please see  the history of present illness.  All other systems reviewed and are negative.   Prior CV studies:   The following studies were reviewed today:  LHC (12/29/2020): Severe single-vessel coronary artery disease with 80-90% stenosis of rPAV.  Mild, nonobstructive disease involving mid RCA and ostium of D2.  Normal LVEF with mildly elevated LVEDP.  Labs/Other Tests and Data Reviewed:    EKG:  No ECG reviewed.  Recent Labs: 12/16/2020: ALT 12; BUN 10; Creatinine, Ser 0.80; Potassium 4.4; Sodium 136 12/25/2020: Hemoglobin 9.7; Platelets 485   Recent Lipid Panel Lab Results  Component Value Date/Time   CHOL 161 12/16/2020 10:38 AM   TRIG 213 (H) 12/16/2020 10:38 AM   HDL 55 12/16/2020 10:38 AM   CHOLHDL 2.9 12/16/2020 10:38 AM   LDLCALC 63 12/16/2020 10:38 AM    Wt Readings from Last 3 Encounters:  03/26/21 170 lb (77.1 kg)  02/02/21 174 lb (78.9 kg)  01/26/21 176 lb 6.4 oz (80 kg)     Risk Assessment/Calculations:          Objective:    Vital Signs:  BP (!) 142/70 (BP Location: Left Arm)   Pulse 90   Ht 5\' 3"  (1.6 m)   Wt 170 lb (77.1 kg)   BMI 30.11 kg/m    VITAL SIGNS:  reviewed GEN:  no acute distress  ASSESSMENT & PLAN:    Coronary artery disease with  stable angina: Despite being on lower dose metoprolol and having stopped amlodipine several days ago, Ms. Petre has not developed angina.  Given her elevated BP today, we will restart amlodipine 2.5 mg daily.  I think it is reasonable to continue metoprolol tartrate 25 mg BID.  While iron deficiency anemia continues to be worked up and angina remains stable, I think it would be best to defer PCI to rPLAV.  Iron deficiency anemia: Upper and lower endoscopy unrevealing.  Capsule endoscopy was recommended by GI but has yet to be pursued.  I have advised Ms. Seiber to reach out to Dr. Dorothey Baseman office to inquire about moving forward with the capsule endoscopy and arranging follow-up.  We will repeat a CBC today.  If hemoglobin has not improved despite iron supplementation, hematology consultation will need to be considered.  Hyperlipidemia associated with type 2 diabetes mellitus: LDL at goal, triglycerides mildly elevated on last check in March.  Given statin intolerance and LDL at goal, we will continue with ezetimibe 10 mg daily as monotherapy.  Hypertension: Blood pressure not well-controlled today (goal < 130/80).  We will restart amlodipine 2.5 mg daily.  Time:   Today, I have spent 10 minutes with the patient with telehealth technology discussing the above problems.     Medication Adjustments/Labs and Tests Ordered: Current medicines are reviewed at length with the patient today.  Concerns regarding medicines are outlined above.   Tests Ordered: CBC without diff   Medication Changes: Restart amlodipine 2.5 mg daily Continue metoprolol 25 mg BID   Follow Up:  In Person in 3 month(s)  Signed, [name], MD  03/26/2021 9:56 AM    [place]

## 2021-03-27 ENCOUNTER — Encounter: Payer: Self-pay | Admitting: Internal Medicine

## 2021-03-31 ENCOUNTER — Other Ambulatory Visit: Payer: Self-pay

## 2021-04-02 ENCOUNTER — Other Ambulatory Visit (HOSPITAL_COMMUNITY): Payer: Self-pay

## 2021-04-02 DIAGNOSIS — M545 Low back pain, unspecified: Secondary | ICD-10-CM | POA: Diagnosis not present

## 2021-04-19 DIAGNOSIS — M25511 Pain in right shoulder: Secondary | ICD-10-CM | POA: Diagnosis not present

## 2021-04-21 DIAGNOSIS — G4733 Obstructive sleep apnea (adult) (pediatric): Secondary | ICD-10-CM | POA: Diagnosis not present

## 2021-04-22 ENCOUNTER — Other Ambulatory Visit
Admission: RE | Admit: 2021-04-22 | Discharge: 2021-04-22 | Disposition: A | Discharge status: Home / Self Care | Payer: BC Managed Care – PPO | Attending: Internal Medicine | Admitting: Internal Medicine

## 2021-04-22 DIAGNOSIS — D509 Iron deficiency anemia, unspecified: Secondary | ICD-10-CM | POA: Diagnosis not present

## 2021-04-22 LAB — CBC
HCT: 39.5 % (ref 36.0–46.0)
Hemoglobin: 12.8 g/dL (ref 12.0–15.0)
MCH: 26.7 pg (ref 26.0–34.0)
MCHC: 32.4 g/dL (ref 30.0–36.0)
MCV: 82.5 fL (ref 80.0–100.0)
Platelets: 396 10*3/uL (ref 150–400)
RBC: 4.79 MIL/uL (ref 3.87–5.11)
RDW: 16.8 % — ABNORMAL HIGH (ref 11.5–15.5)
WBC: 9.3 10*3/uL (ref 4.0–10.5)
nRBC: 0 % (ref 0.0–0.2)

## 2021-04-23 ENCOUNTER — Telehealth: Payer: Self-pay | Admitting: *Deleted

## 2021-04-23 NOTE — Telephone Encounter (Signed)
-----   Message from [name], MD sent at 04/22/2021  4:07 PM EDT ----- Hemoglobin has improved and is now normal at 12.8.  Monica Oliver should speak with her PCP and Dr. Allen Norris about need to continue iron supplementation.  If she has not already done so, she should speak with Dr. Dorothey Baseman office about the need to move forward with capsule endoscopy for further evaluation of her iron deficiency anemia.

## 2021-04-23 NOTE — Telephone Encounter (Signed)
Attempted to call pt to review results. No answer. Lmtcb.  

## 2021-04-23 NOTE — Telephone Encounter (Signed)
Pt returned call. Notified of lab results and provider's recc.  Pt voiced understanding. She will contact PCP and Dr. Allen Norris regarding need to continue iron supplementation.  She will also discuss need to move forward with capsule endoscopy for further evaluation of her iron deficiency anemia with Dr. Dorothey Baseman office.   Pt was last seen for video visit with Dr. Saunders Revel 03/26/21 and pt to follow up in person in 3 months.  Offered to schedule pt for f/u now, and pt states that she will call back when she has her calendar.  Will forward to scheduling to make aware.

## 2021-04-26 NOTE — Telephone Encounter (Signed)
Recall placed. Closing encounter.

## 2021-04-27 ENCOUNTER — Other Ambulatory Visit: Payer: Self-pay

## 2021-04-28 ENCOUNTER — Other Ambulatory Visit: Payer: Self-pay

## 2021-04-28 DIAGNOSIS — M545 Low back pain, unspecified: Secondary | ICD-10-CM | POA: Diagnosis not present

## 2021-05-13 DIAGNOSIS — M25511 Pain in right shoulder: Secondary | ICD-10-CM | POA: Diagnosis not present

## 2021-05-17 ENCOUNTER — Other Ambulatory Visit: Payer: Self-pay

## 2021-05-17 ENCOUNTER — Other Ambulatory Visit: Payer: Self-pay | Admitting: Internal Medicine

## 2021-05-17 MED FILL — Lisinopril & Hydrochlorothiazide Tab 20-12.5 MG: ORAL | 90 days supply | Qty: 90 | Fill #1 | Status: AC

## 2021-05-18 ENCOUNTER — Other Ambulatory Visit: Payer: Self-pay

## 2021-05-18 MED FILL — Metoprolol Tartrate Tab 25 MG: ORAL | 30 days supply | Qty: 60 | Fill #0 | Status: AC

## 2021-05-19 ENCOUNTER — Other Ambulatory Visit: Payer: Self-pay

## 2021-05-20 ENCOUNTER — Other Ambulatory Visit: Payer: Self-pay

## 2021-05-24 DIAGNOSIS — M25511 Pain in right shoulder: Secondary | ICD-10-CM | POA: Diagnosis not present

## 2021-05-27 DIAGNOSIS — I10 Essential (primary) hypertension: Secondary | ICD-10-CM | POA: Diagnosis not present

## 2021-05-27 DIAGNOSIS — D509 Iron deficiency anemia, unspecified: Secondary | ICD-10-CM | POA: Diagnosis not present

## 2021-05-27 DIAGNOSIS — I2510 Atherosclerotic heart disease of native coronary artery without angina pectoris: Secondary | ICD-10-CM | POA: Diagnosis not present

## 2021-05-27 DIAGNOSIS — R590 Localized enlarged lymph nodes: Secondary | ICD-10-CM | POA: Diagnosis not present

## 2021-05-27 DIAGNOSIS — E119 Type 2 diabetes mellitus without complications: Secondary | ICD-10-CM | POA: Diagnosis not present

## 2021-05-28 ENCOUNTER — Encounter (HOSPITAL_COMMUNITY): Payer: Self-pay | Admitting: Radiology

## 2021-05-28 ENCOUNTER — Other Ambulatory Visit: Payer: Self-pay | Admitting: Family Medicine

## 2021-05-28 DIAGNOSIS — Z7951 Long term (current) use of inhaled steroids: Secondary | ICD-10-CM

## 2021-05-28 DIAGNOSIS — E559 Vitamin D deficiency, unspecified: Secondary | ICD-10-CM

## 2021-05-28 DIAGNOSIS — R590 Localized enlarged lymph nodes: Secondary | ICD-10-CM

## 2021-06-01 DIAGNOSIS — M25511 Pain in right shoulder: Secondary | ICD-10-CM | POA: Diagnosis not present

## 2021-06-03 ENCOUNTER — Other Ambulatory Visit: Payer: Self-pay

## 2021-06-03 DIAGNOSIS — M25511 Pain in right shoulder: Secondary | ICD-10-CM | POA: Diagnosis not present

## 2021-06-04 DIAGNOSIS — M545 Low back pain, unspecified: Secondary | ICD-10-CM | POA: Diagnosis not present

## 2021-06-10 DIAGNOSIS — M25511 Pain in right shoulder: Secondary | ICD-10-CM | POA: Diagnosis not present

## 2021-06-16 ENCOUNTER — Other Ambulatory Visit (HOSPITAL_COMMUNITY): Payer: Self-pay

## 2021-06-17 ENCOUNTER — Other Ambulatory Visit: Payer: Self-pay

## 2021-06-21 ENCOUNTER — Other Ambulatory Visit: Payer: Self-pay

## 2021-06-22 ENCOUNTER — Other Ambulatory Visit: Payer: Self-pay

## 2021-06-22 MED ORDER — ZOSTER VAC RECOMB ADJUVANTED 50 MCG/0.5ML IM SUSR
INTRAMUSCULAR | 0 refills | Status: DC
  Filled 2021-06-22: qty 0.5, 1d supply, fill #0

## 2021-06-22 MED FILL — Metoprolol Tartrate Tab 25 MG: ORAL | 30 days supply | Qty: 60 | Fill #1 | Status: AC

## 2021-06-24 ENCOUNTER — Other Ambulatory Visit: Payer: Self-pay

## 2021-06-24 MED ORDER — OMEPRAZOLE 20 MG PO CPDR
DELAYED_RELEASE_CAPSULE | ORAL | 1 refills | Status: DC
  Filled 2021-06-24 – 2021-07-15 (×2): qty 90, 90d supply, fill #0
  Filled 2021-09-23: qty 90, 90d supply, fill #1

## 2021-06-30 ENCOUNTER — Ambulatory Visit (INDEPENDENT_AMBULATORY_CARE_PROVIDER_SITE_OTHER): Payer: BC Managed Care – PPO | Admitting: Internal Medicine

## 2021-06-30 ENCOUNTER — Other Ambulatory Visit: Payer: Self-pay

## 2021-06-30 ENCOUNTER — Encounter: Payer: Self-pay | Admitting: Internal Medicine

## 2021-06-30 VITALS — BP 118/64 | HR 80 | Temp 96.9°F | Ht 63.0 in | Wt 176.2 lb

## 2021-06-30 DIAGNOSIS — J449 Chronic obstructive pulmonary disease, unspecified: Secondary | ICD-10-CM

## 2021-06-30 DIAGNOSIS — G4733 Obstructive sleep apnea (adult) (pediatric): Secondary | ICD-10-CM

## 2021-06-30 DIAGNOSIS — F17210 Nicotine dependence, cigarettes, uncomplicated: Secondary | ICD-10-CM | POA: Diagnosis not present

## 2021-06-30 DIAGNOSIS — R918 Other nonspecific abnormal finding of lung field: Secondary | ICD-10-CM

## 2021-06-30 DIAGNOSIS — Z23 Encounter for immunization: Secondary | ICD-10-CM | POA: Diagnosis not present

## 2021-06-30 NOTE — Patient Instructions (Signed)
PLEASE STOP SMOKING!!!   EXCELLENT JOB ON CPAP A+++!!!  TRY SYMBICORT 2 PUFFS IN AM ONLY AND ASSESS BREATHING  FOLLOW UP CT CHEST in 1 YEAR FOLLOW PFT's in 1 YEAR

## 2021-06-30 NOTE — Progress Notes (Signed)
PULMONARY OFFICE FOLLOW UP NOTE  PROBLEMS:  Mild COPD Smoker  DATA: LDCT 06/07/16: normal PFTs 06/13/16: mild obstruction with borderline significant improvement after bronchodilator. Normal TLC. Mildly reduced DLCO  INTERVAL HISTORY: Previously seen by VM and diagnosed with mild COPD.    CC  Follow up RBILD Follow up OSA Follow up abnormal CT chest       HPI Diagnosis of OSA CPAP 8 cm water pressure AHI reduced to 0.2 Excellent compliance report   She definitely benefits and use CPAP therapy nightly   No exacerbation at this time No evidence of heart failure at this time No evidence or signs of infection at this time No respiratory distress No fevers, chills, nausea, vomiting, diarrhea No evidence of lower extremity edema No evidence hemoptysis BP stable follow-up PFTs in 1 year  Patient with previous history of bilateral lung nodules Previous CT chest July 08, 2020 Patient will need ongoing CT chest due to ongoing tobacco abuse Follow-up CT chest in 1 year  Smoking Assessment and Cessation Counseling Upon further questioning, Patient smokes 1/2 ppd I have advised patient to quit/stop smoking as soon as possible due to high risk for multiple medical problems  Patient  is NOT willing to quit smoking  I have advised patient that we can assist and have options of Nicotine replacement therapy. I also advised patient on behavioral therapy and can provide oral medication therapy in conjunction with the other therapies Follow up next Office visit  for assessment of smoking cessation Smoking cessation counseling advised for 4 minutes     Review of Systems:  Gen:  Denies  fever, sweats, chills weight loss  HEENT: Denies blurred vision, double vision, ear pain, eye pain, hearing loss, nose bleeds, sore throat Cardiac:  No dizziness, chest pain or heaviness, chest tightness,edema, No JVD Resp:   No cough, -sputum production, -shortness of breath,-wheezing,  -hemoptysis,  Other:  All other systems negative    BP 118/64 (BP Location: Left Arm, Patient Position: Sitting, Cuff Size: Large)   Pulse 80   Temp (!) 96.9 F (36.1 C) (Oral)   Ht 5\' 3"  (1.6 m)   Wt 176 lb 3.2 oz (79.9 kg)   SpO2 95%   BMI 31.21 kg/m   Physical Examination:   General Appearance: No distress  EYES PERRLA, EOM intact.   NECK Supple, No JVD Pulmonary: normal breath sounds, No wheezing.  CardiovascularNormal S1,S2.  No m/r/g.   ALL OTHER ROS ARE NEGATIVE      DATA:   CXR 09/24/16: vague left upper lobe opacity   CXR 11/10/16 : resolution of LUL opacity CT chest 07/16/2018-and discrete bilateral pulmonary nodules not visualized but reported Mild lymphadenopathy anterior tracheal space  CT chest 06/2020 No suspicious focal pulmonary nodules or infiltrates.  Stable subtle peribronchial nodularity within the lung apices bilaterally possibly representing changes of RB-ILD/smoking related lung disease.  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   COPD mild gold stage A Decrease Symbicort to 2 puffs daily and assess breathing Albuterol as needed  No exacerbation at this time   OSA Excellent compliance report reviewed in detail with the patient Continue CPAP as prescribed  Smoking cessation strongly advised   Obesity -recommend significant weight loss -recommend changing  diet  Deconditioned state -Recommend increased daily activity and exercise    Lung cancer screening program Previous history of discrete pulm nodules with some anterior tracheal adenopathy Previous scan in October 2021 shows no suspicious focal pulmonary nodules or infiltrates Findings reflect RB  ILD smoking-related disease Follow-up CT chest pending     MEDICATION ADJUSTMENTS/LABS AND TESTS ORDERED: Please stop smoking continue CPAP as prescribed Symbicort 2 puffs in a.m. Albuterol as needed Repeat CT chest in 1 year Flu shot to be given today  CURRENT MEDICATIONS REVIEWED AT LENGTH WITH PATIENT TODAY   Follow-up 1 year  Total time spent 35 mins    Monica Oliver Monica Oliver, M.D.  [facility]  Medical Director [place] Director [facility] Cardio-Pulmonary Department

## 2021-07-06 ENCOUNTER — Other Ambulatory Visit: Payer: Self-pay

## 2021-07-07 ENCOUNTER — Ambulatory Visit
Admission: RE | Admit: 2021-07-07 | Discharge: 2021-07-07 | Disposition: A | Discharge status: Home / Self Care | Payer: BC Managed Care – PPO | Source: Ambulatory Visit | Attending: Family Medicine | Admitting: Family Medicine

## 2021-07-07 ENCOUNTER — Other Ambulatory Visit: Payer: Self-pay

## 2021-07-07 DIAGNOSIS — Z7951 Long term (current) use of inhaled steroids: Secondary | ICD-10-CM | POA: Diagnosis not present

## 2021-07-07 DIAGNOSIS — E559 Vitamin D deficiency, unspecified: Secondary | ICD-10-CM | POA: Diagnosis not present

## 2021-07-07 DIAGNOSIS — M85851 Other specified disorders of bone density and structure, right thigh: Secondary | ICD-10-CM | POA: Diagnosis not present

## 2021-07-15 ENCOUNTER — Other Ambulatory Visit: Payer: Self-pay

## 2021-07-19 ENCOUNTER — Other Ambulatory Visit: Payer: Self-pay

## 2021-07-20 ENCOUNTER — Other Ambulatory Visit: Payer: Self-pay | Admitting: Internal Medicine

## 2021-07-20 ENCOUNTER — Other Ambulatory Visit: Payer: Self-pay

## 2021-07-20 MED FILL — Metoprolol Tartrate Tab 25 MG: ORAL | 30 days supply | Qty: 60 | Fill #0 | Status: AC

## 2021-07-20 MED FILL — Ezetimibe Tab 10 MG: ORAL | 30 days supply | Qty: 30 | Fill #0 | Status: AC

## 2021-07-21 ENCOUNTER — Other Ambulatory Visit: Payer: Self-pay

## 2021-07-21 DIAGNOSIS — G4733 Obstructive sleep apnea (adult) (pediatric): Secondary | ICD-10-CM | POA: Diagnosis not present

## 2021-07-22 ENCOUNTER — Other Ambulatory Visit: Payer: Self-pay

## 2021-07-26 ENCOUNTER — Other Ambulatory Visit: Payer: Self-pay

## 2021-07-26 ENCOUNTER — Ambulatory Visit
Admission: RE | Admit: 2021-07-26 | Discharge: 2021-07-26 | Disposition: A | Discharge status: Home / Self Care | Payer: BC Managed Care – PPO | Source: Ambulatory Visit | Attending: Internal Medicine | Admitting: Internal Medicine

## 2021-07-26 DIAGNOSIS — I700 Atherosclerosis of aorta: Secondary | ICD-10-CM | POA: Diagnosis not present

## 2021-07-26 DIAGNOSIS — R9389 Abnormal findings on diagnostic imaging of other specified body structures: Secondary | ICD-10-CM | POA: Insufficient documentation

## 2021-07-26 DIAGNOSIS — J439 Emphysema, unspecified: Secondary | ICD-10-CM | POA: Diagnosis not present

## 2021-07-26 DIAGNOSIS — R911 Solitary pulmonary nodule: Secondary | ICD-10-CM | POA: Diagnosis not present

## 2021-08-13 ENCOUNTER — Other Ambulatory Visit: Payer: Self-pay | Admitting: Internal Medicine

## 2021-08-13 ENCOUNTER — Other Ambulatory Visit: Payer: Self-pay

## 2021-08-13 MED FILL — Lisinopril & Hydrochlorothiazide Tab 20-12.5 MG: ORAL | 90 days supply | Qty: 90 | Fill #2 | Status: AC

## 2021-08-13 NOTE — Telephone Encounter (Signed)
Please contact pt for future appointment. Pt due for 3 month f/u. 

## 2021-08-13 NOTE — Telephone Encounter (Signed)
LVM to schedule

## 2021-08-16 ENCOUNTER — Other Ambulatory Visit: Payer: Self-pay

## 2021-08-16 DIAGNOSIS — M47817 Spondylosis without myelopathy or radiculopathy, lumbosacral region: Secondary | ICD-10-CM | POA: Diagnosis not present

## 2021-08-16 DIAGNOSIS — M545 Low back pain, unspecified: Secondary | ICD-10-CM | POA: Diagnosis not present

## 2021-08-16 MED FILL — Amlodipine Besylate Tab 2.5 MG (Base Equivalent): ORAL | 90 days supply | Qty: 90 | Fill #0 | Status: AC

## 2021-08-16 NOTE — Telephone Encounter (Signed)
Scheduled

## 2021-08-17 ENCOUNTER — Other Ambulatory Visit: Payer: Self-pay

## 2021-08-18 ENCOUNTER — Other Ambulatory Visit: Payer: Self-pay

## 2021-08-21 DIAGNOSIS — G4733 Obstructive sleep apnea (adult) (pediatric): Secondary | ICD-10-CM | POA: Diagnosis not present

## 2021-08-30 ENCOUNTER — Other Ambulatory Visit: Payer: Self-pay

## 2021-08-30 MED FILL — Metoprolol Tartrate Tab 25 MG: ORAL | 30 days supply | Qty: 60 | Fill #1 | Status: AC

## 2021-08-31 ENCOUNTER — Other Ambulatory Visit: Payer: Self-pay

## 2021-08-31 MED ORDER — ZOSTER VAC RECOMB ADJUVANTED 50 MCG/0.5ML IM SUSR
INTRAMUSCULAR | 0 refills | Status: DC
  Filled 2021-08-31: qty 0.5, 1d supply, fill #0

## 2021-09-01 DIAGNOSIS — M47817 Spondylosis without myelopathy or radiculopathy, lumbosacral region: Secondary | ICD-10-CM | POA: Diagnosis not present

## 2021-09-01 DIAGNOSIS — E119 Type 2 diabetes mellitus without complications: Secondary | ICD-10-CM | POA: Diagnosis not present

## 2021-09-15 ENCOUNTER — Emergency Department
Admission: EM | Admit: 2021-09-15 | Discharge: 2021-09-15 | Disposition: A | Discharge status: Home / Self Care | Payer: BC Managed Care – PPO | Attending: Emergency Medicine | Admitting: Emergency Medicine

## 2021-09-15 ENCOUNTER — Telehealth: Payer: Self-pay | Admitting: Internal Medicine

## 2021-09-15 ENCOUNTER — Other Ambulatory Visit: Payer: Self-pay

## 2021-09-15 ENCOUNTER — Emergency Department: Payer: BC Managed Care – PPO

## 2021-09-15 DIAGNOSIS — J45909 Unspecified asthma, uncomplicated: Secondary | ICD-10-CM | POA: Diagnosis not present

## 2021-09-15 DIAGNOSIS — Z7951 Long term (current) use of inhaled steroids: Secondary | ICD-10-CM | POA: Diagnosis not present

## 2021-09-15 DIAGNOSIS — R0789 Other chest pain: Secondary | ICD-10-CM | POA: Insufficient documentation

## 2021-09-15 DIAGNOSIS — I2510 Atherosclerotic heart disease of native coronary artery without angina pectoris: Secondary | ICD-10-CM | POA: Diagnosis not present

## 2021-09-15 DIAGNOSIS — Z7984 Long term (current) use of oral hypoglycemic drugs: Secondary | ICD-10-CM | POA: Insufficient documentation

## 2021-09-15 DIAGNOSIS — I5030 Unspecified diastolic (congestive) heart failure: Secondary | ICD-10-CM | POA: Insufficient documentation

## 2021-09-15 DIAGNOSIS — R079 Chest pain, unspecified: Secondary | ICD-10-CM | POA: Diagnosis not present

## 2021-09-15 DIAGNOSIS — I110 Hypertensive heart disease with heart failure: Secondary | ICD-10-CM | POA: Insufficient documentation

## 2021-09-15 DIAGNOSIS — Z7982 Long term (current) use of aspirin: Secondary | ICD-10-CM | POA: Insufficient documentation

## 2021-09-15 DIAGNOSIS — E1169 Type 2 diabetes mellitus with other specified complication: Secondary | ICD-10-CM | POA: Diagnosis not present

## 2021-09-15 DIAGNOSIS — J449 Chronic obstructive pulmonary disease, unspecified: Secondary | ICD-10-CM | POA: Diagnosis not present

## 2021-09-15 DIAGNOSIS — F17210 Nicotine dependence, cigarettes, uncomplicated: Secondary | ICD-10-CM | POA: Diagnosis not present

## 2021-09-15 LAB — CBC
HCT: 38.6 % (ref 36.0–46.0)
Hemoglobin: 12.5 g/dL (ref 12.0–15.0)
MCH: 26.9 pg (ref 26.0–34.0)
MCHC: 32.4 g/dL (ref 30.0–36.0)
MCV: 83 fL (ref 80.0–100.0)
Platelets: 336 10*3/uL (ref 150–400)
RBC: 4.65 MIL/uL (ref 3.87–5.11)
RDW: 14.9 % (ref 11.5–15.5)
WBC: 10.2 10*3/uL (ref 4.0–10.5)
nRBC: 0 % (ref 0.0–0.2)

## 2021-09-15 LAB — PROTIME-INR
INR: 0.8 (ref 0.8–1.2)
Prothrombin Time: 11.5 seconds (ref 11.4–15.2)

## 2021-09-15 LAB — BASIC METABOLIC PANEL
Anion gap: 8 (ref 5–15)
BUN: 11 mg/dL (ref 8–23)
CO2: 28 mmol/L (ref 22–32)
Calcium: 9.6 mg/dL (ref 8.9–10.3)
Chloride: 97 mmol/L — ABNORMAL LOW (ref 98–111)
Creatinine, Ser: 0.78 mg/dL (ref 0.44–1.00)
GFR, Estimated: >60 mL/min (ref >60)
Glucose, Bld: 122 mg/dL — ABNORMAL HIGH (ref 70–99)
Potassium: 3.5 mmol/L (ref 3.5–5.1)
Sodium: 133 mmol/L — ABNORMAL LOW (ref 135–145)

## 2021-09-15 LAB — TROPONIN I (HIGH SENSITIVITY)
Troponin I (High Sensitivity): 3 ng/L (ref <18)
Troponin I (High Sensitivity): 4 ng/L (ref <18)

## 2021-09-15 NOTE — Telephone Encounter (Signed)
Spoke with patient and she reports discomfort to her chest. She states that it is not so much fluttering. She reports this pain comes and goes. Inquired if she has tried nitro and she reports being afraid to take it. Discussed that she really needs to proceed to Emergency Room for work up and evaluation of her symptoms. She expressed concerns of waiting for long periods. She wants to come in and see someone here in the office. Advised that if she is having chest discomfort they would also take her to emergency room. She verbalized understanding of our conversation, agreement to plan, and will proceed to ER for further work up.

## 2021-09-15 NOTE — ED Provider Notes (Signed)
[facility] Emergency Department Provider Note  ____________________________________________   Event Date/Time   First MD Initiated Contact with Patient 09/15/21 2306     (approximate)  I have reviewed the triage vital signs and the nursing notes.   HISTORY  Chief Complaint Chest Pain    HPI Monica Oliver is a 70 y.o. female with history of coronary artery disease with last cath in April 2022 who presents to the emergency department with intermittent left-sided burning chest pain for the past 2 to 3 days.  She does not notice any aggravating or alleviating factors.  It is not worse with exertion or deep inspiration.  No associated shortness of breath, nausea, vomiting, diaphoresis or dizziness.  She does not feel it is related to food and has tried omeprazole at home without relief.  Has also tried extra strength Tylenol but does not remember if this helped or not.  No history of PE, DVT, exogenous estrogen use, recent fractures, surgery, trauma, hospitalization, prolonged travel or other immobilization. No lower extremity swelling or pain. No calf tenderness.  She denies any fevers or cough.  Pain is almost completely resolved at this time.  Her cardiologist is Dr. Saunders Revel.    Cath 12/29/20:  Severe single-vessel coronary artery disease with 80 to 90% stenosis of the RPAV.  There is mild, nonobstructive disease involving the mid RCA and ostium of D2. Normal left ventricular systolic function with mildly elevated filling pressure.   Recommendations: Optimize medical therapy for treatment of angina and progression of coronary artery disease.  Specifically, we will increase metoprolol tartrate to 50 mg twice daily.  Continue ezetimibe in lieu of statin therapy given history of statin intolerance and well-controlled LDL. Proceed with work-up for recently diagnosed iron deficiency anemia including colonoscopy +/- upper endoscopy. If patient has refractory angina, PCI to  the RPAV could be considered once evaluation/treatment of her newly diagnosed anemia has been completed.   [name], MD [facility]        Past Medical History:  Diagnosis Date   Allergy    environmental   Arthritis    Asthma    CAD (coronary artery disease)    a. 08/2018 Cardiac CTA: CAC score 306 (91st %'ile). LM nl, LAD nl, LCX small w/ <50% mid stenosis. RI nl, RCA <50% mid, 51-69% distal (CTFFR of rPL 0.52); b. 12/2020 Cath: LM nl, LAD nl, D2 20, LCX nl, RCA 20p/m, RPAV 85. EF 55-65%-->Med Rx.   Claudication ([place])    a. 08/2019 ABI's nl.   COPD (chronic obstructive pulmonary disease) (HCC)    Diabetes mellitus without complication (HCC)    Diastolic dysfunction    a. 10/2018 Echo: EF 55-60%, Gr1 DD. Nl RV size/fxn.   GERD (gastroesophageal reflux disease)    Hyperlipidemia    Hypertension    Sleep apnea    C-Pap machine   Tobacco abuse     Patient Active Problem List   Diagnosis Date Noted   Iron deficiency anemia    Polyp of sigmoid colon    Accelerating angina ([place]) 12/16/2020   Hyperlipidemia associated with type 2 diabetes mellitus ([place]) 12/16/2020   CAD (coronary artery disease)    Diabetes mellitus without complication (HCC)    Diastolic dysfunction    Hyperlipidemia    Hypertension    QT prolongation 07/10/2019   Coronary artery disease of native artery of native heart with stable angina pectoris ([place]) 10/10/2018   Stable angina ([place]) 08/10/2018   Hyperlipidemia LDL  goal <70 08/10/2018   Essential hypertension 08/10/2018   Aortic atherosclerosis ([place]) 08/10/2018   Tobacco abuse 06/07/2016   Chronic obstructive pulmonary disease ([place]) 04/18/2016   DOE (dyspnea on exertion) 04/18/2016   Tobacco abuse counseling 04/18/2016   Smoker 04/18/2016   OSA on CPAP 04/18/2016   Diabetes ([place]) 05/04/2015    Past Surgical History:  Procedure Laterality Date   [place]    COLONOSCOPY WITH PROPOFOL N/A 02/02/2021   Procedure: COLONOSCOPY WITH PROPOFOL;  Surgeon: [name], MD;  Location: [facility] ENDOSCOPY;  Service: Endoscopy;  Laterality: N/A;   ESOPHAGOGASTRODUODENOSCOPY (EGD) WITH PROPOFOL N/A 02/02/2021   Procedure: ESOPHAGOGASTRODUODENOSCOPY (EGD) WITH PROPOFOL;  Surgeon: [name], MD;  Location: [facility] ENDOSCOPY;  Service: Endoscopy;  Laterality: N/A;   LEFT HEART CATH AND CORONARY ANGIOGRAPHY Left 12/29/2020   Procedure: LEFT HEART CATH AND CORONARY ANGIOGRAPHY;  Surgeon: [name], MD;  Location: [place];  Service: Cardiovascular;  Laterality: Left;    Prior to Admission medications   Medication Sig Start Date End Date Taking? Authorizing Provider  albuterol (PROVENTIL) (2.5 MG/3ML) 0.083% nebulizer solution TAKE 3 MLS (2.5 MG TOTAL) BY NEBULIZATION EVERY 6 HOURS AS NEEDED FOR WHEEZING OR SHORTNESS OF BREATH. 12/25/20   Flora Lipps, MD  albuterol (VENTOLIN HFA) 108 (90 Base) MCG/ACT inhaler INHALE 2 PUFFS BY MOUTH EVERY 4 HOURS AS NEEDED 08/14/20 08/14/21  Flora Lipps, MD  amLODipine (NORVASC) 2.5 MG tablet Take 1 tablet (2.5 mg total) by mouth daily. 08/16/21   End, Harrell Gave, MD  aspirin 81 MG tablet Take 81 mg by mouth daily.    [provider]  budesonide-formoterol (SYMBICORT) 160-4.5 MCG/ACT inhaler INHALE 2 PUFFS BY MOUTH TWO TIMES DAILY 03/18/21 03/18/22  Flora Lipps, MD  cetirizine (ZYRTEC) 10 MG tablet Take 10 mg by mouth daily as needed for allergies.    [provider]  cholecalciferol (VITAMIN D3) 25 MCG (1000 UNIT) tablet Take 1,000 Units by mouth daily.    [provider]  ezetimibe (ZETIA) 10 MG tablet Take 1 tablet (10 mg total) by mouth daily. PLEASE CALL TO SCHEDULE OFFICE VISIT FOR FURTHER REFILLS. THANK YOU! 07/20/21   End, Harrell Gave, MD  ferrous sulfate 325 (65 FE) MG tablet TAKE 1 TABLET BY MOUTH EVERY OTHER DAY FOR ANEMIA (SPANISH) 12/17/20 12/17/21  Llamas, Jewel C, MD  fluticasone  (FLONASE) 50 MCG/ACT nasal spray Place 1 spray into both nostrils daily as needed for allergies or rhinitis. 02/15/21 02/15/22  Magdalen Spatz, NP  gabapentin (NEURONTIN) 300 MG capsule Take 300 mg by mouth 2 (two) times daily.    [provider]  glucose blood (FREESTYLE LITE) test strip Test once a day as directed 03/25/21     hydrocortisone cream 1 % Apply 1 application topically 2 (two) times daily as needed for itching.    [provider]  lisinopril-hydrochlorothiazide (ZESTORETIC) 20-12.5 MG tablet TAKE 1 TABLET BY MOUTH DAILY. 10/21/20   End, Harrell Gave, MD  lisinopril-hydrochlorothiazide (ZESTORETIC) 20-12.5 MG tablet TAKE 1 TABLET BY MOUTH DAILY. 10/21/20 11/16/21  End, Harrell Gave, MD  metFORMIN (GLUCOPHAGE-XR) 500 MG 24 hr tablet Take 2 tablets by mouth twice a day for diabetes 10/22/20     metoprolol tartrate (LOPRESSOR) 25 MG tablet Take 1 tablet (25 mg total) by mouth 2 (two) times daily. 07/20/21   End, Harrell Gave, MD  METOPROLOL TARTRATE PO Take 25 mg by mouth in  the morning and at bedtime.    [provider]  nitroGLYCERIN (NITROSTAT) 0.4 MG SL tablet Place 1 tablet (0.4 mg total) under the tongue every 5 (five) minutes as needed for chest pain. Maximum of 3 doses. 03/26/21   End, Harrell Gave, MD  omeprazole (PRILOSEC) 20 MG capsule Take 20 mg by mouth daily as needed (acid reflux).    [provider]  omeprazole (PRILOSEC) 20 MG capsule TAKE 1 CAPSULE BY MOUTH ONCE A DAY AS NEEDED FOR HEARTBURN 06/24/21     Tiotropium Bromide Monohydrate (SPIRIVA RESPIMAT) 1.25 MCG/ACT AERS INHALE 2 PUFFS INTO THE LUNGS DAILY. 03/18/21 03/18/22  Flora Lipps, MD  tiZANidine (ZANAFLEX) 2 MG tablet Take 1 tablet every 6 hours by oral route as needed. 03/26/21     TRUE METRIX BLOOD GLUCOSE TEST test strip  02/19/16   [provider]  zinc gluconate 50 MG tablet Take 50 mg by mouth daily.    [provider]  Zoster Vaccine Adjuvanted [facility]) injection Inject  into the muscle. 08/31/21   Carlyle Basques, MD    Allergies Penicillins and Statins  Family History  Problem Relation Age of Onset   Hypertension Mother    Cancer Mother        lung   Cancer Father        prostate   Hyperlipidemia Father    Heart attack Father 22   Cancer Brother        prostate   Cirrhosis Sister    Hepatitis Sister    Breast cancer Paternal Aunt     Social History Social History   Tobacco Use   Smoking status: Every Day    Packs/day: 0.75    Years: 40.00    Pack years: 30.00    Types: Cigarettes    Start date: 12/15/1971   Smokeless tobacco: Never   Tobacco comments:    1 cig daily--01/26/2021  Vaping Use   Vaping Use: Never used  Substance Use Topics   Alcohol use: No    Alcohol/week: 0.0 standard drinks   Drug use: No    Review of Systems Constitutional: No fever. Eyes: No visual changes. ENT: No sore throat. Cardiovascular: + chest pain. Respiratory: Denies shortness of breath. Gastrointestinal: No nausea, vomiting, diarrhea. Genitourinary: Negative for dysuria. Musculoskeletal: Negative for back pain. Skin: Negative for rash. Neurological: Negative for focal weakness or numbness.  ____________________________________________   PHYSICAL EXAM:  VITAL SIGNS: ED Triage Vitals  Enc Vitals Group     BP 09/15/21 1718 (!) 151/72     Pulse Rate 09/15/21 1718 95     Resp 09/15/21 1718 16     Temp 09/15/21 1718 98.2 F (36.8 C)     Temp Source 09/15/21 1718 Oral     SpO2 09/15/21 1718 97 %     Weight 09/15/21 1719 172 lb (78 kg)     Height 09/15/21 1719 5\' 3"  (1.6 m)     Head Circumference --      Peak Flow --      Pain Score 09/15/21 1719 5     Pain Loc --      Pain Edu? --      Excl. in [place]? --    CONSTITUTIONAL: Alert and oriented and responds appropriately to questions. Well-appearing; well-nourished HEAD: Normocephalic EYES: Conjunctivae clear, pupils appear equal, EOM appear intact ENT: normal nose; moist mucous  membranes NECK: Supple, normal ROM CARD: RRR; S1 and S2 appreciated; no murmurs, no clicks, no rubs, no gallops  CHEST:  Chest wall is tender to palpation over the left anterior chest wall which reproduces her pain.  No crepitus, ecchymosis, erythema, warmth, rash or other lesions present.   RESP: Normal chest excursion without splinting or tachypnea; breath sounds clear and equal bilaterally; no wheezes, no rhonchi, no rales, no hypoxia or respiratory distress, speaking full sentences ABD/GI: Normal bowel sounds; non-distended; soft, non-tender, no rebound, no guarding, no peritoneal signs, no hepatosplenomegaly BACK: The back appears normal EXT: Normal ROM in all joints; no deformity noted, no edema; no cyanosis, no calf tenderness or calf swelling SKIN: Normal color for age and race; warm; no rash on exposed skin NEURO: Moves all extremities equally PSYCH: The patient's mood and manner are appropriate.  ____________________________________________   LABS (all labs ordered are listed, but only abnormal results are displayed)  Labs Reviewed  BASIC METABOLIC PANEL - Abnormal; Notable for the following components:      Result Value   Sodium 133 (*)    Chloride 97 (*)    Glucose, Bld 122 (*)    All other components within normal limits  CBC  PROTIME-INR  TROPONIN I (HIGH SENSITIVITY)  TROPONIN I (HIGH SENSITIVITY)   ____________________________________________  EKG   EKG Interpretation  Date/Time:  Wednesday September 15 2021 17:34:39 EST Ventricular Rate:  90 PR Interval:  162 QRS Duration: 60 QT Interval:  378 QTC Calculation: 462 R Axis:   -26 Text Interpretation: Normal sinus rhythm Normal ECG Confirmed by Pryor Curia [phone]) on 09/15/2021 11:17:46 PM        ____________________________________________  RADIOLOGY Jessie Foot Shayann Garbutt, personally viewed and evaluated these images (plain radiographs) as part of my medical decision making, as well as reviewing the written  report by the radiologist.  ED MD interpretation: Chest x-ray clear.  Official radiology report(s): DG Chest 2 View  Result Date: 09/15/2021 CLINICAL DATA:  Chest pain EXAM: CHEST - 2 VIEW COMPARISON:  11/28/2017 FINDINGS: Minimal left basilar scarring. Lungs are otherwise clear. No pneumothorax or pleural effusion. Cardiac size within normal limits. Pulmonary vascularity is normal. No acute bone abnormality. IMPRESSION: No active cardiopulmonary disease. Electronically Signed   By: Fidela Salisbury M.D.   On: 09/15/2021 18:34    ____________________________________________   PROCEDURES  Procedure(s) performed (including Critical Care):  Procedures    ____________________________________________   INITIAL IMPRESSION / ASSESSMENT AND PLAN / ED COURSE  As part of my medical decision making, I reviewed the following data within the [place] notes reviewed and incorporated, Labs reviewed , EKG interpreted , Old EKG reviewed, Old chart reviewed, Radiograph reviewed , and Notes from prior ED visits         Patient here with atypical chest pain but does have history of significant coronary artery disease in the RPA V on cath in April 2022.  Plan at that time was to medically optimize her treatment.  They considered PCI but she needed further work-up for new onset anemia that she had at that time.  Her chest pain today however seems very atypical and may be GERD versus musculoskeletal.  Have offered her medications here to see if we can get her completely pain-free but she declined stating that she is already feeling better and would like to go home.  She is hemodynamically stable here.  Her EKG shows no ischemic change x2.  Her troponin x2 is negative.  Her chest x-ray has been reviewed by myself and radiology and is clear without infiltrate, edema, pneumothorax.  Low  suspicion that this is ACS.  No risk factors for PE other than age.  Doubt dissection.  Did  recommend close follow-up with her primary care doctor and cardiologist.  Discussed at length return precautions.  Discussed using over-the-counter medications for pain relief at home if symptoms continue.  Patient verbalized understanding and is comfortable with this plan.  At this time, I do not feel there is any life-threatening condition present. I have reviewed, interpreted and discussed all results (EKG, imaging, lab, urine as appropriate) and exam findings with patient/family. I have reviewed nursing notes and appropriate previous records.  I feel the patient is safe to be discharged home without further emergent workup and can continue workup as an outpatient as needed. Discussed usual and customary return precautions. Patient/family verbalize understanding and are comfortable with this plan.  Outpatient follow-up has been provided as needed. All questions have been answered.  ____________________________________________   FINAL CLINICAL IMPRESSION(S) / ED DIAGNOSES  Final diagnoses:  Atypical chest pain     ED Discharge Orders     None       *Please note:  Maleeyah Mccaughey Custodio was evaluated in Emergency Department on 09/15/2021 for the symptoms described in the history of present illness. She was evaluated in the context of the global COVID-19 pandemic, which necessitated consideration that the patient might be at risk for infection with the SARS-CoV-2 virus that causes COVID-19. Institutional protocols and algorithms that pertain to the evaluation of patients at risk for COVID-19 are in a state of rapid change based on information released by regulatory bodies including the CDC and federal and state organizations. These policies and algorithms were followed during the patient's care in the ED.  Some ED evaluations and interventions may be delayed as a result of limited staffing during and the pandemic.*   Note:  This document was prepared using Dragon voice recognition software and may  include unintentional dictation errors.    Renne Cornick, Delice Bison, DO 09/15/21 2349

## 2021-09-15 NOTE — Telephone Encounter (Signed)
Pt c/o of Chest Pain: STAT if CP now or developed within 24 hours  1. Are you having CP right now? Chest discomfort - fluttering at times  2. Are you experiencing any other symptoms (ex. SOB, nausea, vomiting, sweating)? no  3. How long have you been experiencing CP? Two days   4. Is your CP continuous or coming and going? Coming and going   5. Have you taken Nitroglycerin? no ? Patient does not want to go to ER

## 2021-09-15 NOTE — ED Notes (Signed)
Pt to ED c/o R sided Chest Pain and discomfort that started about 3 days ago. Cp is intermittent, denies SOB. Denies cardiac hx.  Pt is A&ox4, NAD

## 2021-09-15 NOTE — ED Triage Notes (Signed)
Pt reports left-sided chest discomfort x 2-3 days, no SOB. No radiation, dizziness, sweating, nausea.

## 2021-09-15 NOTE — Discharge Instructions (Signed)
You may alternate Tylenol 1000 mg every 6 hours as needed for pain, fever and Ibuprofen 600 mg every 8 hours as needed for pain, fever.  Please take Ibuprofen with food.  Do not take more than 4000 mg of Tylenol (acetaminophen) in a 24 hour period.  Do not take Ibuprofen for more than one week.   You had normal EKGs, chest x-ray and cardiac labs.  Please follow-up closely with your primary care physician and cardiologist as an outpatient.  Please return to the emergency department if you have worsening chest pain especially if it feels like a tightness, pressure or you have associated shortness of breath, sudden sweating, dizziness and feel like you are going to pass out or do pass out, swelling or pain in your legs.   Your chest pain is very atypical today and may be due to musculoskeletal pain especially given it is reproducible with movement of the arm and palpation of the chest wall.

## 2021-09-16 NOTE — Telephone Encounter (Signed)
I looks like Monica Oliver was seen in the ED yesterday and sent home following a reassuring workup.  Could you arrange for her to see me or an APP next week?  If there are no openings on the schedule, I can try to see her one of the afternoons that I am rounding (Tuesday, Thursday, or Friday).  Thanks.  Gerald Stabs

## 2021-09-16 NOTE — Telephone Encounter (Signed)
Spoke with patient and reviewed that provider would like her to come in next week to be seen. She was agreeable with both date and time. Provider gave permission to schedule on 09/23/21 at 2:00 pm. She had no further questions at this time.

## 2021-09-16 NOTE — Telephone Encounter (Signed)
Left voicemail message requesting patient to call back to review recommendations.

## 2021-09-20 DIAGNOSIS — G4733 Obstructive sleep apnea (adult) (pediatric): Secondary | ICD-10-CM | POA: Diagnosis not present

## 2021-09-23 ENCOUNTER — Other Ambulatory Visit: Payer: Self-pay

## 2021-09-23 ENCOUNTER — Ambulatory Visit (INDEPENDENT_AMBULATORY_CARE_PROVIDER_SITE_OTHER): Payer: BC Managed Care – PPO | Admitting: Internal Medicine

## 2021-09-23 ENCOUNTER — Observation Stay
Admission: EM | Admit: 2021-09-23 | Discharge: 2021-09-25 | Disposition: A | Discharge status: Home / Self Care | Payer: BC Managed Care – PPO | Attending: Internal Medicine | Admitting: Internal Medicine

## 2021-09-23 ENCOUNTER — Encounter: Payer: Self-pay | Admitting: Internal Medicine

## 2021-09-23 ENCOUNTER — Emergency Department: Payer: BC Managed Care – PPO

## 2021-09-23 VITALS — BP 130/68 | HR 107 | Ht 63.0 in | Wt 177.1 lb

## 2021-09-23 DIAGNOSIS — I200 Unstable angina: Secondary | ICD-10-CM | POA: Diagnosis not present

## 2021-09-23 DIAGNOSIS — U071 COVID-19: Secondary | ICD-10-CM | POA: Diagnosis present

## 2021-09-23 DIAGNOSIS — I700 Atherosclerosis of aorta: Secondary | ICD-10-CM | POA: Diagnosis present

## 2021-09-23 DIAGNOSIS — E119 Type 2 diabetes mellitus without complications: Secondary | ICD-10-CM | POA: Insufficient documentation

## 2021-09-23 DIAGNOSIS — R079 Chest pain, unspecified: Secondary | ICD-10-CM | POA: Diagnosis not present

## 2021-09-23 DIAGNOSIS — Z7984 Long term (current) use of oral hypoglycemic drugs: Secondary | ICD-10-CM

## 2021-09-23 DIAGNOSIS — Z888 Allergy status to other drugs, medicaments and biological substances status: Secondary | ICD-10-CM

## 2021-09-23 DIAGNOSIS — Z83438 Family history of other disorder of lipoprotein metabolism and other lipidemia: Secondary | ICD-10-CM

## 2021-09-23 DIAGNOSIS — K219 Gastro-esophageal reflux disease without esophagitis: Secondary | ICD-10-CM | POA: Diagnosis present

## 2021-09-23 DIAGNOSIS — R0789 Other chest pain: Secondary | ICD-10-CM | POA: Diagnosis not present

## 2021-09-23 DIAGNOSIS — Z803 Family history of malignant neoplasm of breast: Secondary | ICD-10-CM | POA: Diagnosis not present

## 2021-09-23 DIAGNOSIS — E785 Hyperlipidemia, unspecified: Secondary | ICD-10-CM

## 2021-09-23 DIAGNOSIS — I10 Essential (primary) hypertension: Secondary | ICD-10-CM

## 2021-09-23 DIAGNOSIS — J45909 Unspecified asthma, uncomplicated: Secondary | ICD-10-CM | POA: Diagnosis not present

## 2021-09-23 DIAGNOSIS — G4733 Obstructive sleep apnea (adult) (pediatric): Secondary | ICD-10-CM | POA: Diagnosis present

## 2021-09-23 DIAGNOSIS — G4739 Other sleep apnea: Secondary | ICD-10-CM | POA: Diagnosis not present

## 2021-09-23 DIAGNOSIS — E1169 Type 2 diabetes mellitus with other specified complication: Secondary | ICD-10-CM | POA: Diagnosis present

## 2021-09-23 DIAGNOSIS — I25110 Atherosclerotic heart disease of native coronary artery with unstable angina pectoris: Secondary | ICD-10-CM | POA: Diagnosis not present

## 2021-09-23 DIAGNOSIS — J449 Chronic obstructive pulmonary disease, unspecified: Secondary | ICD-10-CM | POA: Diagnosis present

## 2021-09-23 DIAGNOSIS — Z79899 Other long term (current) drug therapy: Secondary | ICD-10-CM | POA: Diagnosis not present

## 2021-09-23 DIAGNOSIS — Z880 Allergy status to penicillin: Secondary | ICD-10-CM | POA: Diagnosis not present

## 2021-09-23 DIAGNOSIS — Z8249 Family history of ischemic heart disease and other diseases of the circulatory system: Secondary | ICD-10-CM

## 2021-09-23 DIAGNOSIS — F17210 Nicotine dependence, cigarettes, uncomplicated: Secondary | ICD-10-CM | POA: Diagnosis not present

## 2021-09-23 DIAGNOSIS — Z7982 Long term (current) use of aspirin: Secondary | ICD-10-CM | POA: Diagnosis not present

## 2021-09-23 DIAGNOSIS — R000 Tachycardia, unspecified: Secondary | ICD-10-CM | POA: Diagnosis not present

## 2021-09-23 DIAGNOSIS — R0602 Shortness of breath: Secondary | ICD-10-CM | POA: Diagnosis not present

## 2021-09-23 DIAGNOSIS — J811 Chronic pulmonary edema: Secondary | ICD-10-CM | POA: Diagnosis not present

## 2021-09-23 LAB — CBC
HCT: 39.2 % (ref 36.0–46.0)
Hemoglobin: 12.7 g/dL (ref 12.0–15.0)
MCH: 27.1 pg (ref 26.0–34.0)
MCHC: 32.4 g/dL (ref 30.0–36.0)
MCV: 83.8 fL (ref 80.0–100.0)
Platelets: 374 10*3/uL (ref 150–400)
RBC: 4.68 MIL/uL (ref 3.87–5.11)
RDW: 15 % (ref 11.5–15.5)
WBC: 12 10*3/uL — ABNORMAL HIGH (ref 4.0–10.5)
nRBC: 0.2 % (ref 0.0–0.2)

## 2021-09-23 LAB — BASIC METABOLIC PANEL
Anion gap: 8 (ref 5–15)
BUN: 14 mg/dL (ref 8–23)
CO2: 27 mmol/L (ref 22–32)
Calcium: 9.4 mg/dL (ref 8.9–10.3)
Chloride: 97 mmol/L — ABNORMAL LOW (ref 98–111)
Creatinine, Ser: 0.77 mg/dL (ref 0.44–1.00)
GFR, Estimated: >60 mL/min (ref >60)
Glucose, Bld: 116 mg/dL — ABNORMAL HIGH (ref 70–99)
Potassium: 3.5 mmol/L (ref 3.5–5.1)
Sodium: 132 mmol/L — ABNORMAL LOW (ref 135–145)

## 2021-09-23 LAB — PROTIME-INR
INR: 1 (ref 0.8–1.2)
Prothrombin Time: 12.7 s (ref 11.4–15.2)

## 2021-09-23 LAB — RESP PANEL BY RT-PCR (FLU A&B, COVID) ARPGX2
Influenza A by PCR: NEGATIVE
Influenza B by PCR: NEGATIVE
SARS Coronavirus 2 by RT PCR: POSITIVE — AB

## 2021-09-23 LAB — TROPONIN I (HIGH SENSITIVITY)
Troponin I (High Sensitivity): 4 ng/L (ref <18)
Troponin I (High Sensitivity): 4 ng/L
Troponin I (High Sensitivity): 5 ng/L (ref <18)

## 2021-09-23 LAB — FIBRINOGEN: Fibrinogen: 449 mg/dL (ref 210–475)

## 2021-09-23 LAB — BRAIN NATRIURETIC PEPTIDE: B Natriuretic Peptide: 18 pg/mL (ref 0.0–100.0)

## 2021-09-23 LAB — LACTATE DEHYDROGENASE: LDH: 118 U/L (ref 98–192)

## 2021-09-23 LAB — D-DIMER, QUANTITATIVE: D-Dimer, Quant: 0.88 ug/mL-FEU — ABNORMAL HIGH (ref 0.00–0.50)

## 2021-09-23 LAB — APTT: aPTT: 33 seconds (ref 24–36)

## 2021-09-23 LAB — FERRITIN: Ferritin: 16 ng/mL (ref 11–307)

## 2021-09-23 LAB — PROCALCITONIN: Procalcitonin: <0.1 ng/mL

## 2021-09-23 MED ORDER — HEPARIN (PORCINE) 25000 UT/250ML-% IV SOLN
1000.0000 [IU]/h | INTRAVENOUS | Status: DC
  Administered 2021-09-23: 22:00:00 850 [IU]/h via INTRAVENOUS
  Filled 2021-09-23: qty 250

## 2021-09-23 MED ORDER — ASCORBIC ACID 500 MG PO TABS
500.0000 mg | ORAL_TABLET | Freq: Every day | ORAL | Status: DC
  Administered 2021-09-24 – 2021-09-25 (×2): 500 mg via ORAL
  Filled 2021-09-23 (×2): qty 1

## 2021-09-23 MED ORDER — HYDROCORTISONE 1 % EX CREA
1.0000 "application " | TOPICAL_CREAM | Freq: Two times a day (BID) | CUTANEOUS | Status: DC | PRN
  Filled 2021-09-23: qty 28

## 2021-09-23 MED ORDER — LORATADINE 10 MG PO TABS
10.0000 mg | ORAL_TABLET | Freq: Every day | ORAL | Status: DC
  Administered 2021-09-24 – 2021-09-25 (×2): 10 mg via ORAL
  Filled 2021-09-23 (×2): qty 1

## 2021-09-23 MED ORDER — ACETAMINOPHEN 325 MG PO TABS
650.0000 mg | ORAL_TABLET | Freq: Four times a day (QID) | ORAL | Status: DC | PRN
  Administered 2021-09-23: 650 mg via ORAL
  Filled 2021-09-23: qty 2

## 2021-09-23 MED ORDER — HEPARIN BOLUS VIA INFUSION
4000.0000 [IU] | Freq: Once | INTRAVENOUS | Status: AC
  Administered 2021-09-23: 22:00:00 4000 [IU] via INTRAVENOUS
  Filled 2021-09-23: qty 4000

## 2021-09-23 MED ORDER — VITAMIN D 25 MCG (1000 UNIT) PO TABS
1000.0000 [IU] | ORAL_TABLET | Freq: Every day | ORAL | Status: DC
  Administered 2021-09-24 – 2021-09-25 (×2): 1000 [IU] via ORAL
  Filled 2021-09-23 (×2): qty 1

## 2021-09-23 MED ORDER — ENOXAPARIN SODIUM 40 MG/0.4ML IJ SOSY: 40.0000 mg | PREFILLED_SYRINGE | INTRAMUSCULAR | Status: DC

## 2021-09-23 MED ORDER — ASPIRIN 81 MG PO CHEW
81.0000 mg | CHEWABLE_TABLET | ORAL | Status: AC
  Administered 2021-09-24: 08:00:00 81 mg via ORAL
  Filled 2021-09-23: qty 1

## 2021-09-23 MED ORDER — SODIUM CHLORIDE 0.9 % IV SOLN: INTRAVENOUS | Status: DC

## 2021-09-23 MED ORDER — ALBUTEROL SULFATE HFA 108 (90 BASE) MCG/ACT IN AERS
2.0000 | INHALATION_SPRAY | RESPIRATORY_TRACT | Status: DC | PRN
  Filled 2021-09-23: qty 6.7

## 2021-09-23 MED ORDER — GABAPENTIN 300 MG PO CAPS
300.0000 mg | ORAL_CAPSULE | Freq: Two times a day (BID) | ORAL | Status: DC
  Administered 2021-09-23 – 2021-09-25 (×4): 300 mg via ORAL
  Filled 2021-09-23 (×4): qty 1

## 2021-09-23 MED ORDER — SODIUM CHLORIDE 0.9% FLUSH
3.0000 mL | Freq: Two times a day (BID) | INTRAVENOUS | Status: DC
  Administered 2021-09-23: 22:00:00 3 mL via INTRAVENOUS

## 2021-09-23 MED ORDER — ALBUTEROL SULFATE (2.5 MG/3ML) 0.083% IN NEBU: 2.5000 mg | INHALATION_SOLUTION | RESPIRATORY_TRACT | Status: DC | PRN

## 2021-09-23 MED ORDER — HYDROCOD POLST-CPM POLST ER 10-8 MG/5ML PO SUER: 5.0000 mL | Freq: Two times a day (BID) | ORAL | Status: DC | PRN

## 2021-09-23 MED ORDER — NITROGLYCERIN 0.4 MG SL SUBL: 0.4000 mg | SUBLINGUAL_TABLET | SUBLINGUAL | Status: DC | PRN

## 2021-09-23 MED ORDER — AMLODIPINE BESYLATE 5 MG PO TABS
2.5000 mg | ORAL_TABLET | Freq: Every day | ORAL | Status: DC
  Administered 2021-09-24: 11:00:00 2.5 mg via ORAL
  Filled 2021-09-23: qty 1

## 2021-09-23 MED ORDER — ASPIRIN 81 MG PO CHEW
324.0000 mg | CHEWABLE_TABLET | Freq: Once | ORAL | Status: DC
  Administered 2021-09-23: 324 mg via ORAL

## 2021-09-23 MED ORDER — LISINOPRIL-HYDROCHLOROTHIAZIDE 20-12.5 MG PO TABS: 1.0000 | ORAL_TABLET | Freq: Every day | ORAL | Status: DC

## 2021-09-23 MED ORDER — EZETIMIBE 10 MG PO TABS
10.0000 mg | ORAL_TABLET | Freq: Every day | ORAL | Status: DC
  Administered 2021-09-24 – 2021-09-25 (×2): 10 mg via ORAL
  Filled 2021-09-23 (×2): qty 1

## 2021-09-23 MED ORDER — MAGNESIUM HYDROXIDE 400 MG/5ML PO SUSP: 30.0000 mL | Freq: Every day | ORAL | Status: DC | PRN

## 2021-09-23 MED ORDER — SODIUM CHLORIDE 0.9% FLUSH: 3.0000 mL | INTRAVENOUS | Status: DC | PRN

## 2021-09-23 MED ORDER — HYDROCHLOROTHIAZIDE 12.5 MG PO TABS
12.5000 mg | ORAL_TABLET | Freq: Every day | ORAL | Status: DC
  Administered 2021-09-24: 11:00:00 12.5 mg via ORAL
  Filled 2021-09-23: qty 1

## 2021-09-23 MED ORDER — PANTOPRAZOLE SODIUM 40 MG PO TBEC
40.0000 mg | DELAYED_RELEASE_TABLET | Freq: Every day | ORAL | Status: DC
  Administered 2021-09-24 – 2021-09-25 (×2): 40 mg via ORAL
  Filled 2021-09-23 (×2): qty 1

## 2021-09-23 MED ORDER — FLUTICASONE PROPIONATE 50 MCG/ACT NA SUSP
1.0000 | Freq: Every day | NASAL | Status: DC | PRN
  Filled 2021-09-23: qty 16

## 2021-09-23 MED ORDER — TRAZODONE HCL 50 MG PO TABS: 25.0000 mg | ORAL_TABLET | Freq: Every evening | ORAL | Status: DC | PRN

## 2021-09-23 MED ORDER — ASPIRIN EC 81 MG PO TBEC
81.0000 mg | DELAYED_RELEASE_TABLET | Freq: Every day | ORAL | Status: DC
  Administered 2021-09-25: 81 mg via ORAL
  Filled 2021-09-23: qty 1

## 2021-09-23 MED ORDER — LISINOPRIL 10 MG PO TABS
20.0000 mg | ORAL_TABLET | Freq: Every day | ORAL | Status: DC
  Administered 2021-09-24: 11:00:00 20 mg via ORAL
  Filled 2021-09-23: qty 2

## 2021-09-23 MED ORDER — ZINC SULFATE 220 (50 ZN) MG PO CAPS
220.0000 mg | ORAL_CAPSULE | Freq: Every day | ORAL | Status: DC
  Administered 2021-09-24 – 2021-09-25 (×2): 220 mg via ORAL
  Filled 2021-09-23 (×2): qty 1

## 2021-09-23 MED ORDER — SODIUM CHLORIDE 0.9 % IV SOLN
200.0000 mg | Freq: Once | INTRAVENOUS | Status: AC
  Administered 2021-09-23: 200 mg via INTRAVENOUS
  Filled 2021-09-23: qty 200

## 2021-09-23 MED ORDER — ZINC GLUCONATE 50 MG PO TABS: 50.0000 mg | ORAL_TABLET | Freq: Every day | ORAL | Status: DC

## 2021-09-23 MED ORDER — GUAIFENESIN-DM 100-10 MG/5ML PO SYRP: 10.0000 mL | ORAL_SOLUTION | ORAL | Status: DC | PRN

## 2021-09-23 MED ORDER — SODIUM CHLORIDE 0.9 % IV SOLN
100.0000 mg | Freq: Every day | INTRAVENOUS | Status: DC
  Administered 2021-09-24 – 2021-09-25 (×2): 100 mg via INTRAVENOUS
  Filled 2021-09-23: qty 20
  Filled 2021-09-23 (×2): qty 100

## 2021-09-23 MED ORDER — METOPROLOL TARTRATE 25 MG PO TABS
25.0000 mg | ORAL_TABLET | Freq: Two times a day (BID) | ORAL | Status: DC
  Administered 2021-09-23 – 2021-09-25 (×3): 25 mg via ORAL
  Filled 2021-09-23 (×3): qty 1

## 2021-09-23 MED ORDER — SODIUM CHLORIDE 0.9 % IV SOLN: 250.0000 mL | INTRAVENOUS | Status: DC | PRN

## 2021-09-23 MED ORDER — FERROUS SULFATE 325 (65 FE) MG PO TABS
325.0000 mg | ORAL_TABLET | Freq: Every day | ORAL | Status: DC
  Administered 2021-09-24 – 2021-09-25 (×2): 325 mg via ORAL
  Filled 2021-09-23 (×2): qty 1

## 2021-09-23 NOTE — ED Provider Notes (Signed)
[facility] Emergency Department Provider Note   ____________________________________________   Event Date/Time   First MD Initiated Contact with Patient 09/23/21 2053     (approximate)  I have reviewed the triage vital signs and the nursing notes.   HISTORY  Chief Complaint Chest Pain    HPI Monica Oliver is a 70 y.o. female with past medical history of hyperlipidemia, diabetes, COPD, and CAD who presents to the ED complaining of chest pain.  Patient reports that she has been dealing with intermittent pressure in her chest for the past 2 weeks.  Pain has been worse with exertion, increasing in frequency and severity.  She was seen by her cardiologist, Dr. Saunders Revel, for this earlier today and referred to the ED for admission.  Pain has been alleviated by nitroglycerin but has worsened slightly since her arrival to the ED.  She currently describes the pain as sharp and states she has developed a cough over the past 24 hours.  She denies any fevers, difficulty breathing, abdominal pain, nausea, vomiting, or diarrhea.  She is not aware of any recent sick contacts.        Past Medical History:  Diagnosis Date   Allergy    environmental   Arthritis    Asthma    CAD (coronary artery disease)    a. 08/2018 Cardiac CTA: CAC score 306 (91st %'ile). LM nl, LAD nl, LCX small w/ <50% mid stenosis. RI nl, RCA <50% mid, 51-69% distal (CTFFR of rPL 0.52); b. 12/2020 Cath: LM nl, LAD nl, D2 20, LCX nl, RCA 20p/m, RPAV 85. EF 55-65%-->Med Rx.   Claudication ([place])    a. 08/2019 ABI's nl.   COPD (chronic obstructive pulmonary disease) (HCC)    Diabetes mellitus without complication (HCC)    Diastolic dysfunction    a. 10/2018 Echo: EF 55-60%, Gr1 DD. Nl RV size/fxn.   GERD (gastroesophageal reflux disease)    Hyperlipidemia    Hypertension    Sleep apnea    C-Pap machine   Tobacco abuse     Patient Active Problem List   Diagnosis Date Noted   Sinus tachycardia  09/23/2021   Unstable angina ([place]) 09/23/2021   Iron deficiency anemia    Polyp of sigmoid colon    Accelerating angina ([place]) 12/16/2020   Hyperlipidemia associated with type 2 diabetes mellitus ([place]) 12/16/2020   CAD (coronary artery disease)    Diabetes mellitus without complication (HCC)    Diastolic dysfunction    Hyperlipidemia    Hypertension    QT prolongation 07/10/2019   Coronary artery disease involving native coronary artery of native heart with unstable angina pectoris ([place]) 10/10/2018   Stable angina ([place]) 08/10/2018   Hyperlipidemia LDL goal <70 08/10/2018   Essential hypertension 08/10/2018   Aortic atherosclerosis ([place]) 08/10/2018   Tobacco abuse 06/07/2016   Chronic obstructive pulmonary disease ([place]) 04/18/2016   DOE (dyspnea on exertion) 04/18/2016   Tobacco abuse counseling 04/18/2016   Smoker 04/18/2016   OSA on CPAP 04/18/2016   Diabetes ([place]) 05/04/2015    Past Surgical History:  Procedure Laterality Date   [place]   COLONOSCOPY WITH PROPOFOL N/A 02/02/2021   Procedure: COLONOSCOPY WITH PROPOFOL;  Surgeon: [name], MD;  Location: [facility] ENDOSCOPY;  Service: Endoscopy;  Laterality: N/A;   ESOPHAGOGASTRODUODENOSCOPY (EGD) WITH PROPOFOL N/A 02/02/2021   Procedure: ESOPHAGOGASTRODUODENOSCOPY (EGD) WITH PROPOFOL;  Surgeon:  [name], MD;  Location: [facility] ENDOSCOPY;  Service: Endoscopy;  Laterality: N/A;   LEFT HEART CATH AND CORONARY ANGIOGRAPHY Left 12/29/2020   Procedure: LEFT HEART CATH AND CORONARY ANGIOGRAPHY;  Surgeon: [name], MD;  Location: [facility] CV LAB;  Service: Cardiovascular;  Laterality: Left;    Prior to Admission medications   Medication Sig Start Date End Date Taking? Authorizing Provider  albuterol (PROVENTIL) (2.5 MG/3ML) 0.083% nebulizer solution TAKE 3 MLS (2.5 MG TOTAL) BY NEBULIZATION EVERY 6 HOURS AS NEEDED FOR WHEEZING OR SHORTNESS OF BREATH.  12/25/20  Yes Kasa, Kurian, MD  albuterol (VENTOLIN HFA) 108 (90 Base) MCG/ACT inhaler INHALE 2 PUFFS BY MOUTH EVERY 4 HOURS AS NEEDED 08/14/20 09/23/21 Yes Kasa, Maretta Bees, MD  amLODipine (NORVASC) 2.5 MG tablet Take 1 tablet (2.5 mg total) by mouth daily. 08/16/21  Yes End, Harrell Gave, MD  aspirin 81 MG tablet Take 81 mg by mouth daily.   Yes [provider]  budesonide-formoterol (SYMBICORT) 160-4.5 MCG/ACT inhaler INHALE 2 PUFFS BY MOUTH TWO TIMES DAILY 03/18/21 03/18/22 Yes Kasa, Maretta Bees, MD  cetirizine (ZYRTEC) 10 MG tablet Take 10 mg by mouth daily as needed for allergies.   Yes [provider]  cholecalciferol (VITAMIN D3) 25 MCG (1000 UNIT) tablet Take 1,000 Units by mouth daily.   Yes [provider]  ezetimibe (ZETIA) 10 MG tablet Take 1 tablet (10 mg total) by mouth daily. PLEASE CALL TO SCHEDULE OFFICE VISIT FOR FURTHER REFILLS. THANK YOU! 07/20/21  Yes End, Harrell Gave, MD  ferrous sulfate 325 (65 FE) MG tablet TAKE 1 TABLET BY MOUTH EVERY OTHER DAY FOR ANEMIA (SPANISH) 12/17/20 12/17/21 Yes Llamas, Jewel C, MD  fluticasone (FLONASE) 50 MCG/ACT nasal spray Place 1 spray into both nostrils daily as needed for allergies or rhinitis. 02/15/21 02/15/22 Yes Magdalen Spatz, NP  gabapentin (NEURONTIN) 300 MG capsule Take 300 mg by mouth 2 (two) times daily.   Yes [provider]  glucose blood (FREESTYLE LITE) test strip Test once a day as directed 03/25/21  Yes   hydrocortisone cream 1 % Apply 1 application topically 2 (two) times daily as needed for itching.   Yes [provider]  lisinopril-hydrochlorothiazide (ZESTORETIC) 20-12.5 MG tablet TAKE 1 TABLET BY MOUTH DAILY. 10/21/20  Yes End, Harrell Gave, MD  metFORMIN (GLUCOPHAGE-XR) 500 MG 24 hr tablet Take 2 tablets by mouth twice a day for diabetes 10/22/20  Yes   metoprolol tartrate (LOPRESSOR) 25 MG tablet Take 1 tablet (25 mg total) by mouth 2 (two) times daily. 07/20/21  Yes End, Harrell Gave, MD   omeprazole (PRILOSEC) 20 MG capsule TAKE 1 CAPSULE BY MOUTH ONCE A DAY AS NEEDED FOR HEARTBURN 06/24/21  Yes   Tiotropium Bromide Monohydrate (SPIRIVA RESPIMAT) 1.25 MCG/ACT AERS INHALE 2 PUFFS INTO THE LUNGS DAILY. 03/18/21 03/18/22 Yes Kasa, Maretta Bees, MD  tiZANidine (ZANAFLEX) 2 MG tablet Take 1 tablet every 6 hours by oral route as needed. 03/26/21  Yes   TRUE METRIX BLOOD GLUCOSE TEST test strip  02/19/16  Yes [provider]  zinc gluconate 50 MG tablet Take 50 mg by mouth daily.   Yes [provider]  nitroGLYCERIN (NITROSTAT) 0.4 MG SL tablet Place 1 tablet (0.4 mg total) under the tongue every 5 (five) minutes as needed for chest pain. Maximum of 3 doses. 03/26/21   End, Harrell Gave, MD  Zoster Vaccine Adjuvanted [facility]) injection Inject into the muscle. Patient not taking: Reported on 09/23/2021 08/31/21   Carlyle Basques, MD    Allergies Penicillins and  Statins  Family History  Problem Relation Age of Onset   Hypertension Mother    Cancer Mother        lung   Cancer Father        prostate   Hyperlipidemia Father    Heart attack Father 100   Cancer Brother        prostate   Cirrhosis Sister    Hepatitis Sister    Breast cancer Paternal Aunt     Social History Social History   Tobacco Use   Smoking status: Every Day    Packs/day: 0.75    Years: 40.00    Pack years: 30.00    Types: Cigarettes    Start date: 12/15/1971   Smokeless tobacco: Never   Tobacco comments:    1 cig daily--01/26/2021  Vaping Use   Vaping Use: Never used  Substance Use Topics   Alcohol use: No    Alcohol/week: 0.0 standard drinks   Drug use: No    Review of Systems  Constitutional: No fever/chills Eyes: No visual changes. ENT: No sore throat. Cardiovascular: Positive for chest pain. Respiratory: Denies shortness of breath.  Positive for cough. Gastrointestinal: No abdominal pain.  No nausea, no vomiting.  No diarrhea.  No constipation. Genitourinary: Negative for  dysuria. Musculoskeletal: Negative for back pain. Skin: Negative for rash. Neurological: Negative for headaches, focal weakness or numbness.  ____________________________________________   PHYSICAL EXAM:  VITAL SIGNS: ED Triage Vitals  Enc Vitals Group     BP 09/23/21 1434 (!) 152/70     Pulse Rate 09/23/21 1434 (!) 102     Resp 09/23/21 1434 18     Temp 09/23/21 1434 99.4 F (37.4 C)     Temp Source 09/23/21 1434 Oral     SpO2 09/23/21 1434 95 %     Weight --      Height --      Head Circumference --      Peak Flow --      Pain Score 09/23/21 1433 3     Pain Loc --      Pain Edu? --      Excl. in [place]? --     Constitutional: Alert and oriented. Eyes: Conjunctivae are normal. Head: Atraumatic. Nose: No congestion/rhinnorhea. Mouth/Throat: Mucous membranes are moist. Neck: Normal ROM Cardiovascular: Normal rate, regular rhythm. Grossly normal heart sounds.  2+ radial pulses bilaterally. Respiratory: Normal respiratory effort.  No retractions. Lungs CTAB. Gastrointestinal: Soft and nontender. No distention. Genitourinary: deferred Musculoskeletal: No lower extremity tenderness nor edema. Neurologic:  Normal speech and language. No gross focal neurologic deficits are appreciated. Skin:  Skin is warm, dry and intact. No rash noted. Psychiatric: Mood and affect are normal. Speech and behavior are normal.  ____________________________________________   LABS (all labs ordered are listed, but only abnormal results are displayed)  Labs Reviewed  RESP PANEL BY RT-PCR (FLU A&B, COVID) ARPGX2 - Abnormal; Notable for the following components:      Result Value   SARS Coronavirus 2 by RT PCR POSITIVE (*)    All other components within normal limits  BASIC METABOLIC PANEL - Abnormal; Notable for the following components:   Sodium 132 (*)    Chloride 97 (*)    Glucose, Bld 116 (*)    All other components within normal limits  CBC - Abnormal; Notable for the following  components:   WBC 12.0 (*)    All other components within normal limits  PROTIME-INR  APTT  TROPONIN I (  HIGH SENSITIVITY)  TROPONIN I (HIGH SENSITIVITY)   ____________________________________________  EKG  ED ECG REPORT I, Blake Divine, the attending physician, personally viewed and interpreted this ECG.   Date: 09/23/2021  EKG Time: 14:37  Rate: 99  Rhythm: normal sinus rhythm  Axis: Normal  Intervals:none  ST&T Change: None   PROCEDURES  Procedure(s) performed (including Critical Care):  Procedures   ____________________________________________   INITIAL IMPRESSION / ASSESSMENT AND PLAN / ED COURSE      70 year old female with past medical history of hyperlipidemia, CAD, diabetes, COPD who presents to the ED with intermittent pressure in her chest worse with exertion for the past 2 weeks.  EKG shows no evidence of arrhythmia or ischemia and 2 sets of troponin are negative, however symptoms are concerning for unstable angina and patient was referred to the ED for admission for catheterization.  She was given loading dose of aspirin prior to arrival, no evidence of bleeding at this time and we will initiate IV heparin per cardiology recommendations.  Remainder of labs are unremarkable and chest x-ray reviewed by me with no infiltrate, edema, or effusion.  Patient does complain of some ongoing sharp pain in her chest, different than recent pressure.  We will give dose of nitroglycerin but this sharp pain may be related to COVID-19, which patient tested positive for today.  She is maintaining O2 sats on room air, case discussed with hospitalist for admission.      ____________________________________________   FINAL CLINICAL IMPRESSION(S) / ED DIAGNOSES  Final diagnoses:  Unstable angina [facility])     ED Discharge Orders     None        Note:  This document was prepared using Dragon voice recognition software and may include unintentional dictation errors.     Blake Divine, MD 09/23/21 2154

## 2021-09-23 NOTE — Consult Note (Signed)
Remdesivir - Pharmacy Brief Note   O:  ALT: N/A CXR: Low lung volumes and mild pulmonary vascular congestion SpO2: 95% on RA   A/P:  Remdesivir 200 mg IVPB once followed by 100 mg IVPB daily x 4 days.   Pearla Dubonnet, PharmD Clinical Pharmacist 09/23/2021 10:30 PM

## 2021-09-23 NOTE — Progress Notes (Signed)
Follow-up Outpatient Visit Date: 09/23/2021  Primary Care Provider: Rejeana Brock, MD [address] [place] [address]  Chief Complaint: Chest pain  HPI:  Monica Oliver is a 70 y.o. female with history of single-vessel coronary artery disease involving RPAV with stable angina, hypertension, hyperlipidemia, type 2 diabetes mellitus, iron deficiency anemia, COPD, obstructive sleep apnea, shingles, and tobacco abuse, who presents for urgent evaluation of chest pain.  We last spoke via virtual visit in July, at which time Monica Oliver was feeling well without chest pain.  Due to suboptimal blood pressure control, we elected to add amlodipine 2.5 mg daily and continue metoprolol tartrate 25 mg twice daily.  Previous GI work-up for cause of iron deficiency anemia was notable only for subcentimeter polyps.  Capsule endoscopy was recommended, though Monica Oliver has not followed through with this.  She contacted our office last week, complaining of chest pain.  She was referred to the emergency department, where work-up was reassuring.  Today, Monica Oliver complains of continued chest pain since her ED visit (including during our visit).  It comes and goes and seems to be worsened by stress and brisk walking.  When it began ~3 weeks ago, it initially localized to the left shoulder.  However, since then it has been predominantly in center/left side of her chest.  She describes it a pressure with associated nausea.  She has "spit up" several times but has not had frank emesis.  She also feels short of breath most of the time (both with and without the aforementioned chest pain).  She has taken nitroglycerin with some relief.  She denies bleeding and edema.  She feels like her heart is racing today in the office, though she typically does not have palpitations.  --------------------------------------------------------------------------------------------------  Cardiovascular History & Procedures: Cardiovascular  Problems: Coronary artery disease   Risk Factors: Hypertension, hyperlipidemia, diabetes mellitus, obesity, tobacco use, and age greater than 58; coronary artery and aortic atherosclerotic calcification noted on noncontrast CT of the chest (07/16/2018 - personally reviewed)   Cath/PCI: LHC (12/29/2020): Severe single-vessel coronary artery disease with 80-90% stenosis of rPAV.  Mild, nonobstructive disease involving mid RCA and ostium of D2.  Normal LVEF with mildly elevated LVEDP.   CV Surgery: None   EP Procedures and Devices: None   Non-Invasive Evaluation(s): TTE (11/06/2018): Normal LV size with moderate LVH.  LVEF 55-60% with grade 1 diastolic dysfunction.  Mild mitral annular calcification.  Normal RV size and function.  Unable to assess RVSP. Cardiac CTA (09/10/18): CAC score 306 (91st percentile).  LMCA normal.  LAD normal.  LCx relatively small with <50% stenosis in mid vessel.  Small ramus normal.  RCA with <50% stenosis in mid vessel.  51-69% stenosis in distal RCA/rPL (CTFFR of rPL 0.52).  Right lower extremity venous duplex (04/22/2011): No evidence of DVT in the right lower extremity.  Recent CV Pertinent Labs: Lab Results  Component Value Date   CHOL 161 12/16/2020   HDL 55 12/16/2020   LDLCALC 63 12/16/2020   TRIG 213 (H) 12/16/2020   CHOLHDL 2.9 12/16/2020   INR 0.8 09/15/2021   K 3.5 09/15/2021   K 3.6 03/15/2014   BUN 11 09/15/2021   BUN 11 03/15/2014   CREATININE 0.78 09/15/2021   CREATININE 1.01 03/15/2014    Past medical and surgical history were reviewed and updated in EPIC.  Current Meds  Medication Sig   albuterol (PROVENTIL) (2.5 MG/3ML) 0.083% nebulizer solution TAKE 3 MLS (2.5 MG TOTAL) BY NEBULIZATION  EVERY 6 HOURS AS NEEDED FOR WHEEZING OR SHORTNESS OF BREATH.   albuterol (VENTOLIN HFA) 108 (90 Base) MCG/ACT inhaler INHALE 2 PUFFS BY MOUTH EVERY 4 HOURS AS NEEDED   amLODipine (NORVASC) 2.5 MG tablet Take 1 tablet (2.5 mg total) by mouth daily.    aspirin 81 MG tablet Take 81 mg by mouth daily.   budesonide-formoterol (SYMBICORT) 160-4.5 MCG/ACT inhaler INHALE 2 PUFFS BY MOUTH TWO TIMES DAILY   cetirizine (ZYRTEC) 10 MG tablet Take 10 mg by mouth daily as needed for allergies.   cholecalciferol (VITAMIN D3) 25 MCG (1000 UNIT) tablet Take 1,000 Units by mouth daily.   ezetimibe (ZETIA) 10 MG tablet Take 1 tablet (10 mg total) by mouth daily. PLEASE CALL TO SCHEDULE OFFICE VISIT FOR FURTHER REFILLS. THANK YOU!   ferrous sulfate 325 (65 FE) MG tablet TAKE 1 TABLET BY MOUTH EVERY OTHER DAY FOR ANEMIA (SPANISH)   fluticasone (FLONASE) 50 MCG/ACT nasal spray Place 1 spray into both nostrils daily as needed for allergies or rhinitis.   gabapentin (NEURONTIN) 300 MG capsule Take 300 mg by mouth 2 (two) times daily.   glucose blood (FREESTYLE LITE) test strip Test once a day as directed   hydrocortisone cream 1 % Apply 1 application topically 2 (two) times daily as needed for itching.   lisinopril-hydrochlorothiazide (ZESTORETIC) 20-12.5 MG tablet TAKE 1 TABLET BY MOUTH DAILY.   metFORMIN (GLUCOPHAGE-XR) 500 MG 24 hr tablet Take 2 tablets by mouth twice a day for diabetes   metoprolol tartrate (LOPRESSOR) 25 MG tablet Take 1 tablet (25 mg total) by mouth 2 (two) times daily.   nitroGLYCERIN (NITROSTAT) 0.4 MG SL tablet Place 1 tablet (0.4 mg total) under the tongue every 5 (five) minutes as needed for chest pain. Maximum of 3 doses.   omeprazole (PRILOSEC) 20 MG capsule TAKE 1 CAPSULE BY MOUTH ONCE A DAY AS NEEDED FOR HEARTBURN   Tiotropium Bromide Monohydrate (SPIRIVA RESPIMAT) 1.25 MCG/ACT AERS INHALE 2 PUFFS INTO THE LUNGS DAILY.   tiZANidine (ZANAFLEX) 2 MG tablet Take 1 tablet every 6 hours by oral route as needed.   TRUE METRIX BLOOD GLUCOSE TEST test strip    zinc gluconate 50 MG tablet Take 50 mg by mouth daily.    Allergies: Penicillins and Statins  Social History   Tobacco Use   Smoking status: Every Day    Packs/day: 0.75     Years: 40.00    Pack years: 30.00    Types: Cigarettes    Start date: 12/15/1971   Smokeless tobacco: Never   Tobacco comments:    1 cig daily--01/26/2021  Vaping Use   Vaping Use: Never used  Substance Use Topics   Alcohol use: No    Alcohol/week: 0.0 standard drinks   Drug use: No    Family History  Problem Relation Age of Onset   Hypertension Mother    Cancer Mother        lung   Cancer Father        prostate   Hyperlipidemia Father    Heart attack Father 36   Cancer Brother        prostate   Cirrhosis Sister    Hepatitis Sister    Breast cancer Paternal Aunt     Review of Systems: A 12-system review of systems was performed and was negative except as noted in the HPI.  --------------------------------------------------------------------------------------------------  Physical Exam: BP 130/68 (BP Location: Left Arm, Patient Position: Sitting, Cuff Size: Normal)    Pulse Marland Kitchen)  107    Ht 5\' 3"  (1.6 m)    Wt 177 lb 2 oz (80.3 kg)    SpO2 97%    BMI 31.38 kg/m   General:  Appears anxious. Neck: No JVD or HJR. Lungs: Clear to auscultation bilaterally without wheezes or crackles. Heart: Tachycardic but regular without murmurs, rubs, or gallops. Abdomen: Soft, nontender, nondistended. Extremities: No lower extremity edema.  EKG:  Sinus tachycardia with left axis deviation.  Compared with prior tracing from 09/15/2021, heart rate has increased and axis has shifted to the left.  Lab Results  Component Value Date   WBC 10.2 09/15/2021   HGB 12.5 09/15/2021   HCT 38.6 09/15/2021   MCV 83.0 09/15/2021   PLT 336 09/15/2021    Lab Results  Component Value Date   NA 133 (L) 09/15/2021   K 3.5 09/15/2021   CL 97 (L) 09/15/2021   CO2 28 09/15/2021   BUN 11 09/15/2021   CREATININE 0.78 09/15/2021   GLUCOSE 122 (H) 09/15/2021   ALT 12 12/16/2020    Lab Results  Component Value Date   CHOL 161 12/16/2020   HDL 55 12/16/2020   LDLCALC 63 12/16/2020   TRIG 213 (H)  12/16/2020   CHOLHDL 2.9 12/16/2020    --------------------------------------------------------------------------------------------------  ASSESSMENT AND PLAN: Coronary artery disease with unstable angina: Monica Oliver reports frequent chest pain over the last 3 weeks, including 5/10 chest pain at rest in the office today.  She took SL NTG x 1 in my presence and reports near complete resolution of the chest pain.  EKG is notable for sinus tachycardia and left axis deviation but no acute ischemic changes.  It should be noted that her RPAV lesion seen on prior CTA/cath may be electrically silent.  We will administer ASA 324 mg x 1 in the office today and transport Monica Oliver to the ED for further evaluation and admission.  If her workup for other potential causes of chest pain and sinus tachycardia is unrevealing, we will proceed with repeat cardiac catheterization and likely PCI to RPAV tomorrow.  In the meantime, I recommend continuation of aspirin, addition of IV heparin (if no evidence of anemia/bleeding), and escalation of antianginal therapy.  Aggressive secondary prevention should be continued.  Hypertension: BP upper normal today.  Defer medication changes pending evaluation in the ED.  Hyperlipidemia associated with type 2 diabetes mellitus: Continue ezetimibe in lieu of statins, given reasonable LDL and history of myalgias with statins.  Will need to consider switching to PCSK9 inhibitor after discharge if there has been progression of her CAD.  Iron deficiency anemia: No bleeding reported.  Hemoglobin at time of ED visit last week as normal.  Given worsening chest pain and sinus tachycardia today, CBC will need to be repeated.  Sinus tachycardia: Most likely related to pain/stress in the setting of unstable angina.  Though no significant risk factors of PE, d-dimer should be considered.  Obtain echocardiogram to exclude new cardiomyopathy and/or pericardial effusion.  Follow-up: TBD based  on ED visit/hospital course.  [name], MD 09/23/2021 2:09 PM

## 2021-09-23 NOTE — ED Triage Notes (Signed)
Pt comes from [facility] with c/o left sided CP that started few days ago. Pt states little pain now. Pt took nitro at home. [place] gave pt 324 aspirin.

## 2021-09-23 NOTE — Progress Notes (Signed)
Pt came in for OV for hospital f/u Dr. Saunders Revel seen pt, pt c/o on-going CP Advised to take pt to the ER for further cardiac evaluation and work-up 324 mg ASA given per verbal order Pt had taken her personal Nitro 0.4 mg SL with some CP relief  This RN took pt to ER via wheelchair, A&Ox4, NAD noted at this time, report given to front desk triage RN

## 2021-09-23 NOTE — Consult Note (Signed)
ANTICOAGULATION CONSULT NOTE - Initial Consult  Pharmacy Consult for Heparin Indication: chest pain/ACS  Allergies  Allergen Reactions   Penicillins Other (See Comments)    Unsure- she's had it for so long   Statins Other (See Comments)    Muscle Aches    Patient Measurements:   Heparin Dosing Weight: 70 kg  Vital Signs: Temp: 99.4 F (37.4 C) (12/29 1434) Temp Source: Oral (12/29 1434) BP: 144/81 (12/29 2208) Pulse Rate: 95 (12/29 2208)  Labs: Recent Labs    09/23/21 1435 09/23/21 1856  HGB 12.7  --   HCT 39.2  --   PLT 374  --   CREATININE 0.77  --   TROPONINIHS 4 4    Estimated Creatinine Clearance: 66.6 mL/min (by C-G formula based on SCr of 0.77 mg/dL).   Medical History: Past Medical History:  Diagnosis Date   Allergy    environmental   Arthritis    Asthma    CAD (coronary artery disease)    a. 08/2018 Cardiac CTA: CAC score 306 (91st %'ile). LM nl, LAD nl, LCX small w/ <50% mid stenosis. RI nl, RCA <50% mid, 51-69% distal (CTFFR of rPL 0.52); b. 12/2020 Cath: LM nl, LAD nl, D2 20, LCX nl, RCA 20p/m, RPAV 85. EF 55-65%-->Med Rx.   Claudication ([place])    a. 08/2019 ABI's nl.   COPD (chronic obstructive pulmonary disease) (HCC)    Diabetes mellitus without complication (HCC)    Diastolic dysfunction    a. 10/2018 Echo: EF 55-60%, Gr1 DD. Nl RV size/fxn.   GERD (gastroesophageal reflux disease)    Hyperlipidemia    Hypertension    Sleep apnea    C-Pap machine   Tobacco abuse     Medications:  (Not in a hospital admission)  Scheduled:   [START ON 09/24/2021] amLODipine  2.5 mg Oral Daily   [START ON 09/24/2021] vitamin C  500 mg Oral Daily   [START ON 09/24/2021] aspirin  81 mg Oral Pre-Cath   [START ON 09/24/2021] aspirin  81 mg Oral Daily   [START ON 09/24/2021] cholecalciferol  1,000 Units Oral Daily   [START ON 09/24/2021] ezetimibe  10 mg Oral Daily   [START ON 09/24/2021] ferrous sulfate  325 mg Oral Q breakfast   gabapentin  300 mg Oral  BID   [START ON 09/24/2021] lisinopril-hydrochlorothiazide  1 tablet Oral Daily   [START ON 09/24/2021] loratadine  10 mg Oral Daily   metoprolol tartrate  25 mg Oral BID   [START ON 09/24/2021] pantoprazole  40 mg Oral Daily   sodium chloride flush  3 mL Intravenous Q12H   [START ON 09/24/2021] zinc gluconate  50 mg Oral Daily   [START ON 09/24/2021] zinc sulfate  220 mg Oral Daily   Infusions:   sodium chloride     [START ON 09/24/2021] sodium chloride     sodium chloride     heparin 850 Units/hr (09/23/21 2223)   remdesivir 200 mg in sodium chloride 0.9% 250 mL IVPB     Followed by   Derrill Memo ON 09/24/2021] remdesivir 100 mg in NS 100 mL     PRN: sodium chloride, acetaminophen, albuterol, albuterol, chlorpheniramine-HYDROcodone, fluticasone, guaiFENesin-dextromethorphan, hydrocortisone cream, magnesium hydroxide, nitroGLYCERIN, nitroGLYCERIN, sodium chloride flush, traZODone Anti-infectives (From admission, onward)    Start     Dose/Rate Route Frequency Ordered Stop   09/24/21 1000  remdesivir 100 mg in sodium chloride 0.9 % 100 mL IVPB       See Hyperspace for full Linked Orders  Report.   100 mg 200 mL/hr over 30 Minutes Intravenous Daily 09/23/21 2228 09/28/21 0959   09/23/21 2300  remdesivir 200 mg in sodium chloride 0.9% 250 mL IVPB       See Hyperspace for full Linked Orders Report.   200 mg 580 mL/hr over 30 Minutes Intravenous Once 09/23/21 2228         Assessment: Pharmacy has been consulted to initiate Heparin in 71yo patient presenting to the ED with complaints of intermittent chest pressure for the past 2 weeks. Troponin levels unremarkable. EKG shows no evidence of arrhythmia or ischemia. However, per EDP, symptoms are concerning for unstable angina. Patient has no history of chronic anticoagulant use. Baseline labs have been ordered and are pending.  Goal of Therapy:  Heparin level 0.3-0.7 units/ml Monitor platelets by anticoagulation protocol: Yes   Plan:   Give 4000 units bolus x 1 Start heparin infusion at 850 units/hr Check anti-Xa level in 6 hours and daily while on heparin Continue to monitor H&H and platelets  Stefanie Hodgens A Tobyn Osgood 09/23/2021,10:48 PM

## 2021-09-23 NOTE — Patient Instructions (Signed)
You will be transported to the emergency department for further evaluation and admission.

## 2021-09-24 ENCOUNTER — Other Ambulatory Visit: Payer: Self-pay

## 2021-09-24 ENCOUNTER — Encounter
Admission: EM | Disposition: A | Discharge status: Home / Self Care | Payer: Self-pay | Source: Home / Self Care | Attending: Student in an Organized Health Care Education/Training Program

## 2021-09-24 DIAGNOSIS — I25110 Atherosclerotic heart disease of native coronary artery with unstable angina pectoris: Secondary | ICD-10-CM

## 2021-09-24 DIAGNOSIS — I200 Unstable angina: Secondary | ICD-10-CM | POA: Diagnosis not present

## 2021-09-24 DIAGNOSIS — U071 COVID-19: Secondary | ICD-10-CM | POA: Diagnosis not present

## 2021-09-24 HISTORY — PX: CORONARY STENT INTERVENTION: CATH118234

## 2021-09-24 HISTORY — PX: LEFT HEART CATH AND CORONARY ANGIOGRAPHY: CATH118249

## 2021-09-24 LAB — CBG MONITORING, ED
Glucose-Capillary: 107 mg/dL — ABNORMAL HIGH (ref 70–99)
Glucose-Capillary: 96 mg/dL (ref 70–99)
Glucose-Capillary: 102 mg/dL — ABNORMAL HIGH (ref 70–99)

## 2021-09-24 LAB — CBC WITH DIFFERENTIAL/PLATELET
Abs Immature Granulocytes: 0.02 10*3/uL (ref 0.00–0.07)
Basophils Absolute: 0.1 10*3/uL (ref 0.0–0.1)
Basophils Relative: 1 %
Eosinophils Absolute: 0.1 10*3/uL (ref 0.0–0.5)
Eosinophils Relative: 1 %
HCT: 37.4 % (ref 36.0–46.0)
Hemoglobin: 11.8 g/dL — ABNORMAL LOW (ref 12.0–15.0)
Immature Granulocytes: 0 %
Lymphocytes Relative: 31 %
Lymphs Abs: 1.9 10*3/uL (ref 0.7–4.0)
MCH: 26.2 pg (ref 26.0–34.0)
MCHC: 31.6 g/dL (ref 30.0–36.0)
MCV: 83.1 fL (ref 80.0–100.0)
Monocytes Absolute: 0.8 10*3/uL (ref 0.1–1.0)
Monocytes Relative: 12 %
Neutro Abs: 3.4 10*3/uL (ref 1.7–7.7)
Neutrophils Relative %: 55 %
Platelets: 336 10*3/uL (ref 150–400)
RBC: 4.5 MIL/uL (ref 3.87–5.11)
RDW: 15.3 % (ref 11.5–15.5)
WBC: 6.2 10*3/uL (ref 4.0–10.5)
nRBC: 0 % (ref 0.0–0.2)

## 2021-09-24 LAB — COMPREHENSIVE METABOLIC PANEL
ALT: 14 U/L (ref 0–44)
AST: 21 U/L (ref 15–41)
Albumin: 3.6 g/dL (ref 3.5–5.0)
Alkaline Phosphatase: 67 U/L (ref 38–126)
Anion gap: 9 (ref 5–15)
BUN: 10 mg/dL (ref 8–23)
CO2: 25 mmol/L (ref 22–32)
Calcium: 9 mg/dL (ref 8.9–10.3)
Chloride: 100 mmol/L (ref 98–111)
Creatinine, Ser: 0.77 mg/dL (ref 0.44–1.00)
GFR, Estimated: >60 mL/min (ref >60)
Glucose, Bld: 95 mg/dL (ref 70–99)
Potassium: 3.5 mmol/L (ref 3.5–5.1)
Sodium: 134 mmol/L — ABNORMAL LOW (ref 135–145)
Total Bilirubin: 0.5 mg/dL (ref 0.3–1.2)
Total Protein: 6.9 g/dL (ref 6.5–8.1)

## 2021-09-24 LAB — C-REACTIVE PROTEIN
CRP: 4.6 mg/dL — ABNORMAL HIGH (ref <1.0)
CRP: 6.1 mg/dL — ABNORMAL HIGH (ref <1.0)

## 2021-09-24 LAB — HIV ANTIBODY (ROUTINE TESTING W REFLEX): HIV Screen 4th Generation wRfx: NONREACTIVE

## 2021-09-24 LAB — D-DIMER, QUANTITATIVE: D-Dimer, Quant: 0.62 ug/mL-FEU — ABNORMAL HIGH (ref 0.00–0.50)

## 2021-09-24 LAB — HEPARIN LEVEL (UNFRACTIONATED): Heparin Unfractionated: 0.23 IU/mL — ABNORMAL LOW (ref 0.30–0.70)

## 2021-09-24 LAB — HEMOGLOBIN A1C
Hgb A1c MFr Bld: 6.6 % — ABNORMAL HIGH (ref 4.8–5.6)
Mean Plasma Glucose: 142.72 mg/dL

## 2021-09-24 LAB — GLUCOSE, CAPILLARY
Glucose-Capillary: 82 mg/dL (ref 70–99)
Glucose-Capillary: 155 mg/dL — ABNORMAL HIGH (ref 70–99)
Glucose-Capillary: 121 mg/dL — ABNORMAL HIGH (ref 70–99)

## 2021-09-24 LAB — TROPONIN I (HIGH SENSITIVITY): Troponin I (High Sensitivity): 6 ng/L (ref <18)

## 2021-09-24 LAB — FERRITIN: Ferritin: 21 ng/mL (ref 11–307)

## 2021-09-24 SURGERY — LEFT HEART CATH AND CORONARY ANGIOGRAPHY
Anesthesia: Moderate Sedation

## 2021-09-24 MED ORDER — SODIUM CHLORIDE 0.9% FLUSH: 3.0000 mL | INTRAVENOUS | Status: DC | PRN

## 2021-09-24 MED ORDER — SODIUM CHLORIDE 0.9 % IV SOLN
INTRAVENOUS | Status: AC | PRN
  Administered 2021-09-24: 10 mL/h via INTRAVENOUS

## 2021-09-24 MED ORDER — MIDAZOLAM HCL 2 MG/2ML IJ SOLN
INTRAMUSCULAR | Status: AC
  Filled 2021-09-24: qty 2

## 2021-09-24 MED ORDER — AMLODIPINE BESYLATE 5 MG PO TABS
5.0000 mg | ORAL_TABLET | Freq: Every day | ORAL | Status: DC
  Administered 2021-09-25: 5 mg via ORAL
  Filled 2021-09-24: qty 1

## 2021-09-24 MED ORDER — MIDAZOLAM HCL 2 MG/2ML IJ SOLN
INTRAMUSCULAR | Status: DC | PRN
  Administered 2021-09-24 (×2): 1 mg via INTRAVENOUS

## 2021-09-24 MED ORDER — HEPARIN SODIUM (PORCINE) 1000 UNIT/ML IJ SOLN
INTRAMUSCULAR | Status: DC | PRN
  Administered 2021-09-24: 3000 [IU] via INTRAVENOUS
  Administered 2021-09-24: 4000 [IU] via INTRAVENOUS
  Administered 2021-09-24 (×2): 2000 [IU] via INTRAVENOUS
  Administered 2021-09-24: 4000 [IU] via INTRAVENOUS

## 2021-09-24 MED ORDER — INSULIN ASPART 100 UNIT/ML IJ SOLN: 0.0000 [IU] | INTRAMUSCULAR | Status: DC

## 2021-09-24 MED ORDER — POTASSIUM CHLORIDE CRYS ER 20 MEQ PO TBCR: 40.0000 meq | EXTENDED_RELEASE_TABLET | Freq: Once | ORAL | Status: DC

## 2021-09-24 MED ORDER — FUROSEMIDE 10 MG/ML IJ SOLN
20.0000 mg | Freq: Every day | INTRAMUSCULAR | Status: DC
  Administered 2021-09-24 – 2021-09-25 (×2): 20 mg via INTRAVENOUS
  Filled 2021-09-24: qty 2

## 2021-09-24 MED ORDER — CLOPIDOGREL BISULFATE 75 MG PO TABS
600.0000 mg | ORAL_TABLET | Freq: Once | ORAL | Status: AC
  Administered 2021-09-24: 11:00:00 600 mg via ORAL
  Filled 2021-09-24: qty 8

## 2021-09-24 MED ORDER — HEPARIN SODIUM (PORCINE) 1000 UNIT/ML IJ SOLN
INTRAMUSCULAR | Status: AC
  Filled 2021-09-24: qty 10

## 2021-09-24 MED ORDER — FENTANYL CITRATE (PF) 100 MCG/2ML IJ SOLN
INTRAMUSCULAR | Status: AC
  Filled 2021-09-24: qty 2

## 2021-09-24 MED ORDER — SODIUM CHLORIDE 0.9 % IV SOLN: 250.0000 mL | INTRAVENOUS | Status: DC | PRN

## 2021-09-24 MED ORDER — FENTANYL CITRATE (PF) 100 MCG/2ML IJ SOLN
INTRAMUSCULAR | Status: DC | PRN
  Administered 2021-09-24 (×4): 25 ug via INTRAVENOUS

## 2021-09-24 MED ORDER — LIDOCAINE HCL 1 % IJ SOLN
INTRAMUSCULAR | Status: AC
  Filled 2021-09-24: qty 20

## 2021-09-24 MED ORDER — FUROSEMIDE 10 MG/ML IJ SOLN
INTRAMUSCULAR | Status: AC
  Filled 2021-09-24: qty 2

## 2021-09-24 MED ORDER — LABETALOL HCL 5 MG/ML IV SOLN: 10.0000 mg | INTRAVENOUS | Status: AC | PRN

## 2021-09-24 MED ORDER — HYDRALAZINE HCL 20 MG/ML IJ SOLN: 10.0000 mg | INTRAMUSCULAR | Status: AC | PRN

## 2021-09-24 MED ORDER — NITROGLYCERIN 1 MG/10 ML FOR IR/CATH LAB
INTRA_ARTERIAL | Status: AC
  Filled 2021-09-24: qty 10

## 2021-09-24 MED ORDER — VERAPAMIL HCL 2.5 MG/ML IV SOLN
INTRAVENOUS | Status: AC
  Filled 2021-09-24: qty 2

## 2021-09-24 MED ORDER — HEPARIN (PORCINE) IN NACL 1000-0.9 UT/500ML-% IV SOLN
INTRAVENOUS | Status: AC
  Filled 2021-09-24: qty 1000

## 2021-09-24 MED ORDER — IOHEXOL 300 MG/ML  SOLN
INTRAMUSCULAR | Status: DC | PRN
  Administered 2021-09-24: 14:00:00 136 mL

## 2021-09-24 MED ORDER — HEPARIN (PORCINE) IN NACL 1000-0.9 UT/500ML-% IV SOLN
INTRAVENOUS | Status: DC | PRN
  Administered 2021-09-24 (×2): 500 mL

## 2021-09-24 MED ORDER — HEPARIN BOLUS VIA INFUSION
1100.0000 [IU] | Freq: Once | INTRAVENOUS | Status: AC
  Administered 2021-09-24: 11:00:00 1100 [IU] via INTRAVENOUS
  Filled 2021-09-24: qty 1100

## 2021-09-24 MED ORDER — LIDOCAINE HCL (PF) 1 % IJ SOLN
INTRAMUSCULAR | Status: DC | PRN
  Administered 2021-09-24 (×2): 2 mL

## 2021-09-24 MED ORDER — ENOXAPARIN SODIUM 40 MG/0.4ML IJ SOSY
40.0000 mg | PREFILLED_SYRINGE | INTRAMUSCULAR | Status: DC
  Administered 2021-09-25: 40 mg via SUBCUTANEOUS
  Filled 2021-09-24 (×3): qty 0.4

## 2021-09-24 MED ORDER — CLOPIDOGREL BISULFATE 75 MG PO TABS
75.0000 mg | ORAL_TABLET | Freq: Every day | ORAL | Status: DC
  Administered 2021-09-25: 75 mg via ORAL
  Filled 2021-09-24: qty 1

## 2021-09-24 MED ORDER — NITROGLYCERIN 1 MG/10 ML FOR IR/CATH LAB
INTRA_ARTERIAL | Status: DC | PRN
  Administered 2021-09-24 (×4): 200 ug via INTRACORONARY

## 2021-09-24 MED ORDER — SODIUM CHLORIDE 0.9% FLUSH
3.0000 mL | Freq: Two times a day (BID) | INTRAVENOUS | Status: DC
  Administered 2021-09-24 – 2021-09-25 (×2): 3 mL via INTRAVENOUS

## 2021-09-24 SURGICAL SUPPLY — 26 items
BALLN EUPHORA RX 1.5X10 (BALLOONS) ×2
BALLN EUPHORA RX 2.0X10 (BALLOONS) ×2
BALLN MINITREK RX 1.5X12 (BALLOONS) ×2
BALLN ~~LOC~~ TREK RX 3.0X8 (BALLOONS) ×2
BALLOON EUPHORA RX 1.5X10 (BALLOONS) IMPLANT
BALLOON EUPHORA RX 2.0X10 (BALLOONS) IMPLANT
BALLOON MINITREK RX 1.5X12 (BALLOONS) IMPLANT
BALLOON ~~LOC~~ TREK RX 3.0X8 (BALLOONS) IMPLANT
CATH 5FR JL3.5 JR4 ANG PIG MP (CATHETERS) ×1 IMPLANT
CATH TELESCOPE 6F GEC (CATHETERS) ×1 IMPLANT
CATH VISTA GUIDE 6FR JR4 (CATHETERS) ×1 IMPLANT
COVER EZ STRL 42X30 (DRAPES) ×1 IMPLANT
DEVICE RAD TR BAND REGULAR (VASCULAR PRODUCTS) ×1 IMPLANT
DRAPE BRACHIAL (DRAPES) ×2 IMPLANT
GLIDESHEATH SLEND A-KIT 6F 22G (SHEATH) ×1 IMPLANT
GUIDEWIRE INQWIRE 1.5J.035X260 (WIRE) IMPLANT
INQWIRE 1.5J .035X260CM (WIRE) ×2
PACK CARDIAC CATH (CUSTOM PROCEDURE TRAY) ×2 IMPLANT
PROTECTION STATION PRESSURIZED (MISCELLANEOUS) ×2
SET ATX SIMPLICITY (MISCELLANEOUS) ×1 IMPLANT
STATION PROTECTION PRESSURIZED (MISCELLANEOUS) IMPLANT
STENT ONYX FRONTIER 2.5X12 (Permanent Stent) ×1 IMPLANT
TUBING CIL FLEX 10 FLL-RA (TUBING) ×1 IMPLANT
WIRE ASAHI GRAND SLAM 180CM (WIRE) ×1 IMPLANT
WIRE ASAHI PROWATER 180CM (WIRE) ×1 IMPLANT
WIRE RUNTHROUGH .014X180CM (WIRE) ×1 IMPLANT

## 2021-09-24 NOTE — Progress Notes (Signed)
°  INTERVAL PROGRESS NOTE    Monica Oliver- 70 y.o. female  LOS: 1 __________________________________________________________________  SUBJECTIVE: Admitted 09/23/2021 with cc of chest pain Chief Complaint  Patient presents with   Chest Pain   Since admission, resolution of symptoms. She denies DOE, LE edema, CP, N/V. She does have generalized fatigue.  OBJECTIVE: Blood pressure 127/62, pulse 80, temperature 99.4 F (37.4 C), temperature source Oral, resp. rate 19, SpO2 94 %.  General: NAD, pleasant, able to participate in exam Cardiac: RRR, normal heart sounds, no murmurs. 2+ radial and PT pulses bilaterally Respiratory: CTAB, normal effort, No wheezes, rales or rhonchi Extremities: no edema. WWP. Skin: warm and dry, no rashes noted Neuro: alert and oriented, no focal deficits Psych: Normal affect and mood   ASSESSMENT/PLAN:  I have reviewed the full H&P by Dr. Hal Hope, and I agree with the assessment and plan as outlined therein. In addition: Cardiology consulted by admitting team. Will follow up with them in regard to dispo plan.   Continue current COVID treatment.    Principal Problem:   Unstable angina ([place])   Richarda Osmond, DO [facility] 09/24/2021, 7:41 AM    [email or web address] Available by Epic secure chat 7AM-7PM. If 7PM-7AM, please contact night-coverage   No Charge

## 2021-09-24 NOTE — ED Notes (Signed)
Pt sleeping at this time.NAD noted. Respirations even and unlabored

## 2021-09-24 NOTE — H&P (Signed)
History and Physical    Monica Oliver OEU:[id number] DOB: 1951/02/24 DOA: 09/23/2021  PCP: Rejeana Brock, MD  Patient coming from: Home.  Chief Complaint: Chest pain.  HPI: Monica Oliver is a 70 y.o. female with history of CAD status post PCI COPD, diabetes mellitus type 2, diastolic dysfunction, sleep apnea has been experiencing left-sided anterior chest wall pain off and on for the last 3 weeks which has become more persistent and has been frequent.  Patient had gone to cardiologist office today and over the patient had another episode of chest pain which was completely relieved by sublingual nitroglycerin.  Patient's symptoms were concerning for unstable angina and patient was advised to come to the ER.  Patient today also had increasing cough which was nonproductive.  Patient states he has chronic shortness of breath from COPD.  ED Course: In the ER patient was chest pain-free.  Chest x-ray unremarkable EKG shows normal sinus rhythm.  High sensitive troponins were negative.  WBC count was 12.  COVID test was positive.  Patient was started on heparin and remdesivir admitted for further management.  Review of Systems: As per HPI, rest all negative.   Past Medical History:  Diagnosis Date   Allergy    environmental   Arthritis    Asthma    CAD (coronary artery disease)    a. 08/2018 Cardiac CTA: CAC score 306 (91st %'ile). LM nl, LAD nl, LCX small w/ <50% mid stenosis. RI nl, RCA <50% mid, 51-69% distal (CTFFR of rPL 0.52); b. 12/2020 Cath: LM nl, LAD nl, D2 20, LCX nl, RCA 20p/m, RPAV 85. EF 55-65%-->Med Rx.   Claudication ([place])    a. 08/2019 ABI's nl.   COPD (chronic obstructive pulmonary disease) (HCC)    Diabetes mellitus without complication (HCC)    Diastolic dysfunction    a. 10/2018 Echo: EF 55-60%, Gr1 DD. Nl RV size/fxn.   GERD (gastroesophageal reflux disease)    Hyperlipidemia    Hypertension    Sleep apnea    C-Pap machine   Tobacco abuse     Past Surgical History:   Procedure Laterality Date   [place]   COLONOSCOPY WITH PROPOFOL N/A 02/02/2021   Procedure: COLONOSCOPY WITH PROPOFOL;  Surgeon: [name], MD;  Location: [facility];  Service: Endoscopy;  Laterality: N/A;   ESOPHAGOGASTRODUODENOSCOPY (EGD) WITH PROPOFOL N/A 02/02/2021   Procedure: ESOPHAGOGASTRODUODENOSCOPY (EGD) WITH PROPOFOL;  Surgeon: [name], MD;  Location: [facility];  Service: Endoscopy;  Laterality: N/A;   LEFT HEART CATH AND CORONARY ANGIOGRAPHY Left 12/29/2020   Procedure: LEFT HEART CATH AND CORONARY ANGIOGRAPHY;  Surgeon: [name], MD;  Location: [place] [facility];  Service: Cardiovascular;  Laterality: Left;     reports that she has been smoking cigarettes. She started smoking about 49 years ago. She has a 30.00 pack-year smoking history. She has never used smokeless tobacco. She reports that she does not drink alcohol and does not use drugs.  Allergies  Allergen Reactions   Penicillins Other (See Comments)    Unsure- she's had it for so long   Statins Other (See Comments)    Muscle Aches    Family History  Problem Relation Age of Onset   Hypertension Mother    Cancer Mother        lung   Cancer Father        prostate  Hyperlipidemia Father    Heart attack Father 8   Cancer Brother        prostate   Cirrhosis Sister    Hepatitis Sister    Breast cancer Paternal Aunt     Prior to Admission medications   Medication Sig Start Date End Date Taking? Authorizing Provider  albuterol (PROVENTIL) (2.5 MG/3ML) 0.083% nebulizer solution TAKE 3 MLS (2.5 MG TOTAL) BY NEBULIZATION EVERY 6 HOURS AS NEEDED FOR WHEEZING OR SHORTNESS OF BREATH. 12/25/20  Yes Kasa, Kurian, MD  albuterol (VENTOLIN HFA) 108 (90 Base) MCG/ACT inhaler INHALE 2 PUFFS BY MOUTH EVERY 4 HOURS AS NEEDED 08/14/20 09/23/21 Yes Kasa, Maretta Bees, MD  amLODipine (NORVASC) 2.5 MG tablet Take 1 tablet (2.5 mg total) by  mouth daily. 08/16/21  Yes End, Harrell Gave, MD  aspirin 81 MG tablet Take 81 mg by mouth daily.   Yes [provider]  budesonide-formoterol (SYMBICORT) 160-4.5 MCG/ACT inhaler INHALE 2 PUFFS BY MOUTH TWO TIMES DAILY 03/18/21 03/18/22 Yes Kasa, Maretta Bees, MD  cetirizine (ZYRTEC) 10 MG tablet Take 10 mg by mouth daily as needed for allergies.   Yes [provider]  cholecalciferol (VITAMIN D3) 25 MCG (1000 UNIT) tablet Take 1,000 Units by mouth daily.   Yes [provider]  ezetimibe (ZETIA) 10 MG tablet Take 1 tablet (10 mg total) by mouth daily. PLEASE CALL TO SCHEDULE OFFICE VISIT FOR FURTHER REFILLS. THANK YOU! 07/20/21  Yes End, Harrell Gave, MD  ferrous sulfate 325 (65 FE) MG tablet TAKE 1 TABLET BY MOUTH EVERY OTHER DAY FOR ANEMIA (SPANISH) 12/17/20 12/17/21 Yes Llamas, Jewel C, MD  fluticasone (FLONASE) 50 MCG/ACT nasal spray Place 1 spray into both nostrils daily as needed for allergies or rhinitis. 02/15/21 02/15/22 Yes Magdalen Spatz, NP  gabapentin (NEURONTIN) 300 MG capsule Take 300 mg by mouth 2 (two) times daily.   Yes [provider]  glucose blood (FREESTYLE LITE) test strip Test once a day as directed 03/25/21  Yes   hydrocortisone cream 1 % Apply 1 application topically 2 (two) times daily as needed for itching.   Yes [provider]  lisinopril-hydrochlorothiazide (ZESTORETIC) 20-12.5 MG tablet TAKE 1 TABLET BY MOUTH DAILY. 10/21/20  Yes End, Harrell Gave, MD  metFORMIN (GLUCOPHAGE-XR) 500 MG 24 hr tablet Take 2 tablets by mouth twice a day for diabetes 10/22/20  Yes   metoprolol tartrate (LOPRESSOR) 25 MG tablet Take 1 tablet (25 mg total) by mouth 2 (two) times daily. 07/20/21  Yes End, Harrell Gave, MD  omeprazole (PRILOSEC) 20 MG capsule TAKE 1 CAPSULE BY MOUTH ONCE A DAY AS NEEDED FOR HEARTBURN 06/24/21  Yes   Tiotropium Bromide Monohydrate (SPIRIVA RESPIMAT) 1.25 MCG/ACT AERS INHALE 2 PUFFS INTO THE LUNGS DAILY. 03/18/21 03/18/22 Yes Kasa, Maretta Bees,  MD  tiZANidine (ZANAFLEX) 2 MG tablet Take 1 tablet every 6 hours by oral route as needed. 03/26/21  Yes   TRUE METRIX BLOOD GLUCOSE TEST test strip  02/19/16  Yes [provider]  zinc gluconate 50 MG tablet Take 50 mg by mouth daily.   Yes [provider]  nitroGLYCERIN (NITROSTAT) 0.4 MG SL tablet Place 1 tablet (0.4 mg total) under the tongue every 5 (five) minutes as needed for chest pain. Maximum of 3 doses. 03/26/21   End, Harrell Gave, MD  Zoster Vaccine Adjuvanted [facility]) injection Inject into the muscle. Patient not taking: Reported on 09/23/2021 08/31/21   Carlyle Basques, MD    Physical Exam: Constitutional: Moderately built and nourished. Vitals:   09/24/21 0100 09/24/21 0230  09/24/21 0412 09/24/21 0430  BP: (!) 119/56 (!) 105/45 (!) 127/58 (!) 127/57  Pulse: 82 75 88 80  Resp: (!) 21 20 (!) 22 (!) 21  Temp:      TempSrc:      SpO2: 92% 94% 94% 96%   Eyes: Anicteric no pallor. ENMT: No discharge from the ears eyes nose and mouth. Neck: No mass felt.  No neck rigidity. Respiratory: No rhonchi or crepitations. Cardiovascular: S1-S2 heard. Abdomen: Soft nontender bowel sound present. Musculoskeletal: No edema. Skin: No rash. Neurologic: Alert awake oriented time place and person.  Moves all extremities. Psychiatric: Appears normal.  Normal affect.   Labs on Admission: I have personally reviewed following labs and imaging studies  CBC: Recent Labs  Lab 09/23/21 1435  WBC 12.0*  HGB 12.7  HCT 39.2  MCV 83.8  PLT 299   Basic Metabolic Panel: Recent Labs  Lab 09/23/21 1435  NA 132*  K 3.5  CL 97*  CO2 27  GLUCOSE 116*  BUN 14  CREATININE 0.77  CALCIUM 9.4   GFR: Estimated Creatinine Clearance: 66.6 mL/min (by C-G formula based on SCr of 0.77 mg/dL). Liver Function Tests: No results for input(s): AST, ALT, ALKPHOS, BILITOT, PROT, ALBUMIN in the last 168 hours. No results for input(s): LIPASE, AMYLASE in the last 168 hours. No results  for input(s): AMMONIA in the last 168 hours. Coagulation Profile: Recent Labs  Lab 09/23/21 2203  INR 1.0   Cardiac Enzymes: No results for input(s): CKTOTAL, CKMB, CKMBINDEX, TROPONINI in the last 168 hours. BNP (last 3 results) No results for input(s): PROBNP in the last 8760 hours. HbA1C: No results for input(s): HGBA1C in the last 72 hours. CBG: No results for input(s): GLUCAP in the last 168 hours. Lipid Profile: No results for input(s): CHOL, HDL, LDLCALC, TRIG, CHOLHDL, LDLDIRECT in the last 72 hours. Thyroid Function Tests: No results for input(s): TSH, T4TOTAL, FREET4, T3FREE, THYROIDAB in the last 72 hours. Anemia Panel: Recent Labs    09/23/21 2203  FERRITIN 16   Urine analysis:    Component Value Date/Time   BILIRUBINUR negative 03/07/2019 1450   PROTEINUR Negative 03/07/2019 1450   UROBILINOGEN 0.2 03/07/2019 1450   NITRITE negative 03/07/2019 1450   LEUKOCYTESUR Negative 03/07/2019 1450   Sepsis Labs: @LABRCNTIP (procalcitonin:4,lacticidven:4) ) Recent Results (from the past 240 hour(s))  Resp Panel by RT-PCR (Flu A&B, Covid) Nasopharyngeal Swab     Status: Abnormal   Collection Time: 09/23/21  6:56 PM   Specimen: Nasopharyngeal Swab; Nasopharyngeal(NP) swabs in vial transport medium  Result Value Ref Range Status   SARS Coronavirus 2 by RT PCR POSITIVE (A) NEGATIVE Final    Comment: (NOTE) SARS-CoV-2 target nucleic acids are DETECTED.  The SARS-CoV-2 RNA is generally detectable in upper respiratory specimens during the acute phase of infection. Positive results are indicative of the presence of the identified virus, but do not rule out bacterial infection or co-infection with other pathogens not detected by the test. Clinical correlation with patient history and other diagnostic information is necessary to determine patient infection status. The expected result is Negative.  Fact Sheet for Patients: EntrepreneurPulse.com.au  Fact  Sheet for Healthcare Providers: IncredibleEmployment.be  This test is not yet approved or cleared by the [place] FDA and  has been authorized for detection and/or diagnosis of SARS-CoV-2 by FDA under an Emergency Use Authorization (EUA).  This EUA will remain in effect (meaning this test can be used) for the duration of  the COVID-19 declaration under  Section 564(b)(1) of the A ct, 21 U.S.C. section 360bbb-3(b)(1), unless the authorization is terminated or revoked sooner.     Influenza A by PCR NEGATIVE NEGATIVE Final   Influenza B by PCR NEGATIVE NEGATIVE Final    Comment: (NOTE) The Xpert Xpress SARS-CoV-2/FLU/RSV plus assay is intended as an aid in the diagnosis of influenza from Nasopharyngeal swab specimens and should not be used as a sole basis for treatment. Nasal washings and aspirates are unacceptable for Xpert Xpress SARS-CoV-2/FLU/RSV testing.  Fact Sheet for Patients: EntrepreneurPulse.com.au  Fact Sheet for Healthcare Providers: IncredibleEmployment.be  This test is not yet approved or cleared by the [place] FDA and has been authorized for detection and/or diagnosis of SARS-CoV-2 by FDA under an Emergency Use Authorization (EUA). This EUA will remain in effect (meaning this test can be used) for the duration of the COVID-19 declaration under Section 564(b)(1) of the Act, 21 U.S.C. section 360bbb-3(b)(1), unless the authorization is terminated or revoked.  Performed at [facility], [address], [place], [place] [address]      Radiological Exams on Admission: DG Chest 2 View  Result Date: 09/23/2021 CLINICAL DATA:  Chest pain over the last several days. Shortness of breath. EXAM: CHEST - 2 VIEW COMPARISON:  Two-view chest x-ray 09/15/2021 FINDINGS: Heart size is normal. Lung volumes are low. Mild pulmonary vascular congestion is noted. No significant edema or effusion is present.  Axial skeleton is otherwise unremarkable. IMPRESSION: Low lung volumes and mild pulmonary vascular congestion. Electronically Signed   By: San Morelle M.D.   On: 09/23/2021 15:08    EKG: Independently reviewed.  Normal sinus rhythm.  Assessment/Plan Principal Problem:   Unstable angina (HCC)    Unstable angina -patient will be kept n.p.o. past midnight anticipation of cardiac cath.  Patient is on heparin.  Aspirin statins and beta-blockers. COVID-19 infection on remdesivir.  Patient is presently not hypoxic chest x-ray does not show any infiltrates.  Follow inflammatory markers. Hypertension on ACE inhibitor's hydrochlorothiazide beta-blockers and amlodipine. Diabetes mellitus type 2 on sliding scale coverage. COPD we will continue with inhalers. History of sleep apnea on CPAP.  Since patient has COVID infection with features concerning for unstable angina will be admitted as inpatient.   DVT prophylaxis: Heparin infusion. Code Status: Full code. Family Communication: Discussed with patient. Disposition Plan: Home. Consults called: Cardiology. Admission status: Inpatient.   Rise Patience MD [facility] Pager [phone].  If 7PM-7AM, please contact night-coverage [email or web address] Password [email or web address]  09/24/2021, 5:35 AM

## 2021-09-24 NOTE — Interval H&P Note (Signed)
History and Physical Interval Note:  09/24/2021 11:57 AM  Monica Oliver  has presented today for surgery, with the diagnosis of unstable angina.  The various methods of treatment have been discussed with the patient and family. After consideration of risks, benefits and other options for treatment, the patient has consented to  Procedure(s): LEFT HEART CATH AND CORONARY ANGIOGRAPHY (N/A) as a surgical intervention.  The patient's history has been reviewed, patient examined, no change in status, stable for surgery.  I have reviewed the patient's chart and labs.  Questions were answered to the patient's satisfaction.    Cath Lab Visit (complete for each Cath Lab visit)  Clinical Evaluation Leading to the Procedure:   ACS: Yes.    Non-ACS: N/A  Tarrence Enck

## 2021-09-24 NOTE — Consult Note (Signed)
[place] for Heparin Indication: chest pain/ACS  Allergies  Allergen Reactions   Penicillins Other (See Comments)    Unsure- she's had it for so long   Statins Other (See Comments)    Muscle Aches    Patient Measurements:   Heparin Dosing Weight: 70 kg  Vital Signs: BP: 127/62 (12/30 0630) Pulse Rate: 80 (12/30 0630)  Labs: Recent Labs    09/23/21 1435 09/23/21 1856 09/23/21 2203 09/24/21 0017 09/24/21 0840  HGB 12.7  --   --   --  11.8*  HCT 39.2  --   --   --  37.4  PLT 374  --   --   --  336  APTT  --   --  33  --   --   LABPROT  --   --  12.7  --   --   INR  --   --  1.0  --   --   HEPARINUNFRC  --   --   --   --  0.23*  CREATININE 0.77  --   --   --   --   TROPONINIHS 4 4 5 6   --      Estimated Creatinine Clearance: 66.6 mL/min (by C-G formula based on SCr of 0.77 mg/dL).   Medical History: Past Medical History:  Diagnosis Date   Allergy    environmental   Arthritis    Asthma    CAD (coronary artery disease)    a. 08/2018 Cardiac CTA: CAC score 306 (91st %'ile). LM nl, LAD nl, LCX small w/ <50% mid stenosis. RI nl, RCA <50% mid, 51-69% distal (CTFFR of rPL 0.52); b. 12/2020 Cath: LM nl, LAD nl, D2 20, LCX nl, RCA 20p/m, RPAV 85. EF 55-65%-->Med Rx.   Claudication ([place])    a. 08/2019 ABI's nl.   COPD (chronic obstructive pulmonary disease) (HCC)    Diabetes mellitus without complication (HCC)    Diastolic dysfunction    a. 10/2018 Echo: EF 55-60%, Gr1 DD. Nl RV size/fxn.   GERD (gastroesophageal reflux disease)    Hyperlipidemia    Hypertension    Sleep apnea    C-Pap machine   Tobacco abuse     Medications:  (Not in a hospital admission) Scheduled:   amLODipine  2.5 mg Oral Daily   vitamin C  500 mg Oral Daily   aspirin EC  81 mg Oral Daily   cholecalciferol  1,000 Units Oral Daily   clopidogrel  600 mg Oral Once   ezetimibe  10 mg Oral Daily   ferrous sulfate  325 mg Oral Q breakfast   gabapentin   300 mg Oral BID   lisinopril  20 mg Oral Daily   And   hydrochlorothiazide  12.5 mg Oral Daily   insulin aspart  0-6 Units Subcutaneous Q4H   loratadine  10 mg Oral Daily   metoprolol tartrate  25 mg Oral BID   pantoprazole  40 mg Oral Daily   sodium chloride flush  3 mL Intravenous Q12H   zinc sulfate  220 mg Oral Daily   Infusions:   sodium chloride     sodium chloride 75 mL/hr at 09/24/21 0524   sodium chloride Stopped (09/24/21 0524)   heparin 850 Units/hr (09/23/21 2223)   remdesivir 100 mg in NS 100 mL     PRN: sodium chloride, acetaminophen, albuterol, chlorpheniramine-HYDROcodone, fluticasone, guaiFENesin-dextromethorphan, hydrocortisone cream, magnesium hydroxide, nitroGLYCERIN, sodium chloride flush, traZODone Anti-infectives (From admission, onward)  Start     Dose/Rate Route Frequency Ordered Stop   09/24/21 1000  remdesivir 100 mg in sodium chloride 0.9 % 100 mL IVPB       See Hyperspace for full Linked Orders Report.   100 mg 200 mL/hr over 30 Minutes Intravenous Daily 09/23/21 2228 09/28/21 0959   09/23/21 2300  remdesivir 200 mg in sodium chloride 0.9% 250 mL IVPB       See Hyperspace for full Linked Orders Report.   200 mg 580 mL/hr over 30 Minutes Intravenous Once 09/23/21 2228 09/24/21 0124       Assessment: Pharmacy has been consulted to initiate Heparin in 70yo patient presenting to the ED with complaints of intermittent chest pressure for the past 2 weeks. Troponin levels unremarkable. EKG shows no evidence of arrhythmia or ischemia. However, per EDP, symptoms are concerning for unstable angina. Patient has no history of chronic anticoagulant use. Baseline labs have been ordered and are pending.  12/30 0840 HL= 0.23     Bolus and inc drip to 1000 u/hr   Goal of Therapy:  Heparin level 0.3-0.7 units/ml Monitor platelets by anticoagulation protocol: Yes   Plan:  Give 4000 units bolus x 1 Start heparin infusion at 850 units/hr Check anti-Xa level in  6 hours and daily while on heparin Continue to monitor H&H and platelets  12/30 0840 HL= 0.23     Bolus and inc drip to 1000 u/hr   Ethyn Schetter A 09/24/2021,10:00 AM

## 2021-09-24 NOTE — H&P (View-Only) (Signed)
Cardiology Consultation:   Patient ID: [name] MRN: [id number]; DOB: Jul 31, 1951  Admit date: 09/23/2021 Date of Consult: 09/24/2021  PCP:  Rejeana Brock, MD   [facility] Providers Cardiologist:  [name], MD     Patient Profile:   Monica Oliver is a 70 y.o. female with a hx of single-vessel coronary artery disease involving RPAV with stable angina, hypertension, hyperlipidemia, type 2 diabetes mellitus, iron deficiency anemia, COPD, obstructive sleep apnea, shingles, and tobacco abuse who is being seen 09/24/2021 for the evaluation of chest pain at the request of Dr. Jovita Gamma.  History of Present Illness:   Monica Oliver was seen in the office yesterday (by me) for evaluation of a 3-week history of increasing chest pain that she describes as tightness, especially when she is anxious or exerting herself.  She had to take nitroglycerin on at least 1 occasion and reports improvement in the chest pain.  She was having ongoing chest tightness in the office yesterday (5/10) as well as some nausea and coughing.  Pain resolved with sublingual nitroglycerin.  However, given concern for unstable angina, she was referred to the ED for further evaluation.  EKG did not show any ischemic changes.  Her troponin was negative x2.  However, she has continued to have off-and-on chest tightness.  Her presentation is complicated by the finding of COVID-19 infection.  She was started on IV heparin and remdesivir in the ED.  Most recent catheterization in April showed severe single-vessel CAD involving the RPAV branch, which was not intervened upon due to relatively stable symptoms as well as iron deficiency anemia.  She underwent upper and lower endoscopy since then without obvious cause for her anemia identified.  Capsule endoscopy was recommended but never performed.  However, hemoglobin has normalized.  Monica Oliver denies melena and hematochezia   Past Medical History:  Diagnosis Date   Allergy     environmental   Arthritis    Asthma    CAD (coronary artery disease)    a. 08/2018 Cardiac CTA: CAC score 306 (91st %'ile). LM nl, LAD nl, LCX small w/ <50% mid stenosis. RI nl, RCA <50% mid, 51-69% distal (CTFFR of rPL 0.52); b. 12/2020 Cath: LM nl, LAD nl, D2 20, LCX nl, RCA 20p/m, RPAV 85. EF 55-65%-->Med Rx.   Claudication ([place])    a. 08/2019 ABI's nl.   COPD (chronic obstructive pulmonary disease) (HCC)    Diabetes mellitus without complication (HCC)    Diastolic dysfunction    a. 10/2018 Echo: EF 55-60%, Gr1 DD. Nl RV size/fxn.   GERD (gastroesophageal reflux disease)    Hyperlipidemia    Hypertension    Sleep apnea    C-Pap machine   Tobacco abuse     Past Surgical History:  Procedure Laterality Date   [place]   COLONOSCOPY WITH PROPOFOL N/A 02/02/2021   Procedure: COLONOSCOPY WITH PROPOFOL;  Surgeon: [name], MD;  Location: [facility] ENDOSCOPY;  Service: Endoscopy;  Laterality: N/A;   ESOPHAGOGASTRODUODENOSCOPY (EGD) WITH PROPOFOL N/A 02/02/2021   Procedure: ESOPHAGOGASTRODUODENOSCOPY (EGD) WITH PROPOFOL;  Surgeon: [name], MD;  Location: [facility] ENDOSCOPY;  Service: Endoscopy;  Laterality: N/A;   LEFT HEART CATH AND CORONARY ANGIOGRAPHY Left 12/29/2020   Procedure: LEFT HEART CATH AND CORONARY ANGIOGRAPHY;  Surgeon: [name], MD;  Location: [place] [facility];  Service: Cardiovascular;  Laterality: Left;     Home Medications:  Prior to Admission medications   Medication Sig Start Date Monica Oliver Date Taking? Authorizing Provider  albuterol (PROVENTIL) (2.5 MG/3ML) 0.083% nebulizer solution TAKE 3 MLS (2.5 MG TOTAL) BY NEBULIZATION EVERY 6 HOURS AS NEEDED FOR WHEEZING OR SHORTNESS OF BREATH. 12/25/20  Yes Kasa, Kurian, MD  albuterol (VENTOLIN HFA) 108 (90 Base) MCG/ACT inhaler INHALE 2 PUFFS BY MOUTH EVERY 4 HOURS AS NEEDED 08/14/20 09/23/21 Yes Kasa, Maretta Bees, MD  amLODipine (NORVASC) 2.5 MG tablet  Take 1 tablet (2.5 mg total) by mouth daily. 08/16/21  Yes Tylea Hise, Harrell Gave, MD  aspirin 81 MG tablet Take 81 mg by mouth daily.   Yes [provider]  budesonide-formoterol (SYMBICORT) 160-4.5 MCG/ACT inhaler INHALE 2 PUFFS BY MOUTH TWO TIMES DAILY 03/18/21 03/18/22 Yes Kasa, Maretta Bees, MD  cetirizine (ZYRTEC) 10 MG tablet Take 10 mg by mouth daily as needed for allergies.   Yes [provider]  cholecalciferol (VITAMIN D3) 25 MCG (1000 UNIT) tablet Take 1,000 Units by mouth daily.   Yes [provider]  ezetimibe (ZETIA) 10 MG tablet Take 1 tablet (10 mg total) by mouth daily. PLEASE CALL TO SCHEDULE OFFICE VISIT FOR FURTHER REFILLS. THANK YOU! 07/20/21  Yes Kiyoshi Schaab, Harrell Gave, MD  ferrous sulfate 325 (65 FE) MG tablet TAKE 1 TABLET BY MOUTH EVERY OTHER DAY FOR ANEMIA (SPANISH) 12/17/20 12/17/21 Yes Llamas, Jewel C, MD  fluticasone (FLONASE) 50 MCG/ACT nasal spray Place 1 spray into both nostrils daily as needed for allergies or rhinitis. 02/15/21 02/15/22 Yes Magdalen Spatz, NP  gabapentin (NEURONTIN) 300 MG capsule Take 300 mg by mouth 2 (two) times daily.   Yes [provider]  glucose blood (FREESTYLE LITE) test strip Test once a day as directed 03/25/21  Yes   hydrocortisone cream 1 % Apply 1 application topically 2 (two) times daily as needed for itching.   Yes [provider]  lisinopril-hydrochlorothiazide (ZESTORETIC) 20-12.5 MG tablet TAKE 1 TABLET BY MOUTH DAILY. 10/21/20  Yes Harshil Cavallaro, Harrell Gave, MD  metFORMIN (GLUCOPHAGE-XR) 500 MG 24 hr tablet Take 2 tablets by mouth twice a day for diabetes 10/22/20  Yes   metoprolol tartrate (LOPRESSOR) 25 MG tablet Take 1 tablet (25 mg total) by mouth 2 (two) times daily. 07/20/21  Yes Halah Whiteside, Harrell Gave, MD  omeprazole (PRILOSEC) 20 MG capsule TAKE 1 CAPSULE BY MOUTH ONCE A DAY AS NEEDED FOR HEARTBURN 06/24/21  Yes   Tiotropium Bromide Monohydrate (SPIRIVA RESPIMAT) 1.25 MCG/ACT AERS INHALE 2 PUFFS INTO THE LUNGS DAILY.  03/18/21 03/18/22 Yes Kasa, Maretta Bees, MD  tiZANidine (ZANAFLEX) 2 MG tablet Take 1 tablet every 6 hours by oral route as needed. 03/26/21  Yes   TRUE METRIX BLOOD GLUCOSE TEST test strip  02/19/16  Yes [provider]  zinc gluconate 50 MG tablet Take 50 mg by mouth daily.   Yes [provider]  nitroGLYCERIN (NITROSTAT) 0.4 MG SL tablet Place 1 tablet (0.4 mg total) under the tongue every 5 (five) minutes as needed for chest pain. Maximum of 3 doses. 03/26/21   Pattijo Juste, Harrell Gave, MD  Zoster Vaccine Adjuvanted [facility]) injection Inject into the muscle. Patient not taking: Reported on 09/23/2021 08/31/21   Carlyle Basques, MD    Inpatient Medications: Scheduled Meds:  [facility] Hold] amLODipine  2.5 mg Oral Daily   [MAR Hold] vitamin C  500 mg Oral Daily   [MAR Hold] aspirin EC  81 mg Oral Daily   [MAR Hold] cholecalciferol  1,000 Units Oral Daily   [MAR Hold] ezetimibe  10 mg Oral Daily   [  MAR Hold] ferrous sulfate  325 mg Oral Q breakfast   [MAR Hold] gabapentin  300 mg Oral BID   [MAR Hold] lisinopril  20 mg Oral Daily   And   [MAR Hold] hydrochlorothiazide  12.5 mg Oral Daily   [MAR Hold] insulin aspart  0-6 Units Subcutaneous Q4H   [MAR Hold] loratadine  10 mg Oral Daily   [MAR Hold] metoprolol tartrate  25 mg Oral BID   [MAR Hold] pantoprazole  40 mg Oral Daily   sodium chloride flush  3 mL Intravenous Q12H   [MAR Hold] zinc sulfate  220 mg Oral Daily   Continuous Infusions:  sodium chloride     sodium chloride 75 mL/hr at 09/24/21 0524   sodium chloride Stopped (09/24/21 0524)   heparin 1,000 Units/hr (09/24/21 1046)   [MAR Hold] remdesivir 100 mg in NS 100 mL 100 mg (09/24/21 1044)   PRN Meds: sodium chloride, [MAR Hold] acetaminophen, [MAR Hold] albuterol, [MAR Hold] chlorpheniramine-HYDROcodone, [MAR Hold] fluticasone, [MAR Hold] guaiFENesin-dextromethorphan, [MAR Hold] hydrocortisone cream, [MAR Hold] magnesium hydroxide, [MAR Hold] nitroGLYCERIN, sodium chloride  flush, [MAR Hold] traZODone  Allergies:    Allergies  Allergen Reactions   Penicillins Other (See Comments)    Unsure- she's had it for so long   Statins Other (See Comments)    Muscle Aches    Social History:   Social History   Socioeconomic History   Marital status: Married    Spouse name: Not on file   Number of children: 3   Years of education: Not on file   Highest education level: Not on file  Occupational History   Occupation: CNA  Tobacco Use   Smoking status: Every Day    Packs/day: 0.75    Years: 40.00    Pack years: 30.00    Types: Cigarettes    Start date: 12/15/1971   Smokeless tobacco: Never   Tobacco comments:    1 cig daily--01/26/2021  Vaping Use   Vaping Use: Never used  Substance and Sexual Activity   Alcohol use: No    Alcohol/week: 0.0 standard drinks   Drug use: No   Sexual activity: Never  Other Topics Concern   Not on file  Social History Narrative   Not on file   Social Determinants of Health   Financial Resource Strain: Not on file  Food Insecurity: Not on file  Transportation Needs: Not on file  Physical Activity: Not on file  Stress: Not on file  Social Connections: Not on file  Intimate Partner Violence: Not on file    Family History:   Family History  Problem Relation Age of Onset   Hypertension Mother    Cancer Mother        lung   Cancer Father        prostate   Hyperlipidemia Father    Heart attack Father 22   Cancer Brother        prostate   Cirrhosis Sister    Hepatitis Sister    Breast cancer Paternal Aunt      ROS:  Please see the history of present illness. All other ROS reviewed and negative.     Physical Exam/Data:   Vitals:   09/24/21 0412 09/24/21 0430 09/24/21 0630 09/24/21 1042  BP: (!) 127/58 (!) 127/57 127/62 134/66  Pulse: 88 80 80 82  Resp: (!) 22 (!) 21 19 19   Temp:      TempSrc:      SpO2: 94% 96% 94% 98%  Intake/Output Summary (Last 24 hours) at 09/24/2021 1155 Last data filed at  09/23/2021 2224 Gross per 24 hour  Intake 3 ml  Output --  Net 3 ml   Last 3 Weights 09/23/2021 09/15/2021 06/30/2021  Weight (lbs) 177 lb 2 oz 172 lb 176 lb 3.2 oz  Weight (kg) 80.343 kg 78.019 kg 79.924 kg     There is no height or weight on file to calculate BMI.  General:  Well nourished, well developed, in no acute distress HEENT: normal Neck: no JVD Vascular: No carotid bruits; Distal pulses 2+ bilaterally Cardiac:  normal S1, S2; RRR; no murmurs, rubs, or gallops Lungs:  clear to auscultation bilaterally, no wheezing, rhonchi or rales  Abd: soft, nontender, no hepatomegaly  Ext: no edema Musculoskeletal:  No deformities, BUE and BLE strength normal and equal Skin: warm and dry  Neuro:  CNs 2-12 intact, no focal abnormalities noted Psych:  Normal affect   EKG:  The EKG was personally reviewed and demonstrates: Normal sinus rhythm with low voltage. Telemetry:  Telemetry was personally reviewed and demonstrates: Sinus rhythm  Relevant CV Studies: LHC (12/29/2020): Severe single-vessel coronary artery disease with 80 to 90% stenosis of the RPAV.  There is mild, nonobstructive disease involving the mid RCA and ostium of D2. Normal left ventricular systolic function with mildly elevated filling pressure.  Laboratory Data:  High Sensitivity Troponin:   Recent Labs  Lab 09/15/21 2147 09/23/21 1435 09/23/21 1856 09/23/21 2203 09/24/21 0017  TROPONINIHS 4 4 4 5 6      Chemistry Recent Labs  Lab 09/23/21 1435 09/24/21 0840  NA 132* 134*  K 3.5 3.5  CL 97* 100  CO2 27 25  GLUCOSE 116* 95  BUN 14 10  CREATININE 0.77 0.77  CALCIUM 9.4 9.0  GFRNONAA >60 >60  ANIONGAP 8 9    Recent Labs  Lab 09/24/21 0840  PROT 6.9  ALBUMIN 3.6  AST 21  ALT 14  ALKPHOS 67  BILITOT 0.5   Lipids No results for input(s): CHOL, TRIG, HDL, LABVLDL, LDLCALC, CHOLHDL in the last 168 hours.  Hematology Recent Labs  Lab 09/23/21 1435 09/24/21 0840  WBC 12.0* 6.2  RBC 4.68 4.50   HGB 12.7 11.8*  HCT 39.2 37.4  MCV 83.8 83.1  MCH 27.1 26.2  MCHC 32.4 31.6  RDW 15.0 15.3  PLT 374 336   Thyroid No results for input(s): TSH, FREET4 in the last 168 hours.  BNP Recent Labs  Lab 09/23/21 2203  BNP 18.0    DDimer  Recent Labs  Lab 09/23/21 2203 09/24/21 0840  DDIMER 0.88* 0.62*     Radiology/Studies:  DG Chest 2 View  Result Date: 09/23/2021 CLINICAL DATA:  Chest pain over the last several days. Shortness of breath. EXAM: CHEST - 2 VIEW COMPARISON:  Two-view chest x-ray 09/15/2021 FINDINGS: Heart size is normal. Lung volumes are low. Mild pulmonary vascular congestion is noted. No significant edema or effusion is present. Axial skeleton is otherwise unremarkable. IMPRESSION: Low lung volumes and mild pulmonary vascular congestion. Electronically Signed   By: San Morelle M.D.   On: 09/23/2021 15:08     Assessment and Plan:   Unstable angina: Ms. Goodell has a history of severe single-vessel CAD seen on prior cath and coronary CTA.  Her symptoms have progressed over the last few weeks and are now present at rest.  Additional physiologic stress from COVID-19 infection may be contributing to this.  We have discussed further management options and have agreed  to proceed with repeat catheterization and likely PCI to the RPA V branch.  I will load her with clopidogrel.  She should continue aspirin and IV heparin pending catheterization.  Secondary prevention with statin therapy recommended.  COVID-19 infection: Symptoms are relatively minimal.  Elevated D-dimer noted.  Presentation most consistent with unstable angina, though will defer to primary team as to whether further work-up for PE is indicated.  Continue management per IM.  Hypertension: Blood pressure well controlled.  Hyperlipidemia associated with type 2 diabetes mellitus: Continue ezetimibe given history of statin intolerance.  Will need to consider transitioning to PCSK9 inhibitor as an  outpatient.  Shared Decision Making/Informed Consent The risks [stroke (1 in 1000), death (1 in 1000), kidney failure [usually temporary] (1 in 500), bleeding (1 in 200), allergic reaction [possibly serious] (1 in 200)], benefits (diagnostic support and management of coronary artery disease) and alternatives of a cardiac catheterization were discussed in detail with Ms. Rencher and she is willing to proceed.  Risk Assessment/Risk Scores:     TIMI Risk Score for Unstable Angina or Non-ST Elevation MI:   The patient's TIMI risk score is 5, which indicates a 26% risk of all cause mortality, new or recurrent myocardial infarction or need for urgent revascularization in the next 14 days.  For questions or updates, please contact [place] Please consult [email or web address] for contact info under [facility] Card[facility].  Signed, [name], MD  09/24/2021 11:55 AM

## 2021-09-24 NOTE — Consult Note (Addendum)
Cardiology Consultation:   Patient ID: KANESHIA CATER MRN: [id number]; DOB: 09/13/1951  Admit date: 09/23/2021 Date of Consult: 09/24/2021  PCP:  Rejeana Brock, MD   [facility] Providers Cardiologist:  [name], MD     Patient Profile:   Monica Oliver is a 70 y.o. female with a hx of single-vessel coronary artery disease involving RPAV with stable angina, hypertension, hyperlipidemia, type 2 diabetes mellitus, iron deficiency anemia, COPD, obstructive sleep apnea, shingles, and tobacco abuse who is being seen 09/24/2021 for the evaluation of chest pain at the request of Dr. Jovita Gamma.  History of Present Illness:   Monica Oliver was seen in the office yesterday (by me) for evaluation of a 3-week history of increasing chest pain that she describes as tightness, especially when she is anxious or exerting herself.  She had to take nitroglycerin on at least 1 occasion and reports improvement in the chest pain.  She was having ongoing chest tightness in the office yesterday (5/10) as well as some nausea and coughing.  Pain resolved with sublingual nitroglycerin.  However, given concern for unstable angina, she was referred to the ED for further evaluation.  EKG did not show any ischemic changes.  Her troponin was negative x2.  However, she has continued to have off-and-on chest tightness.  Her presentation is complicated by the finding of COVID-19 infection.  She was started on IV heparin and remdesivir in the ED.  Most recent catheterization in April showed severe single-vessel CAD involving the RPAV branch, which was not intervened upon due to relatively stable symptoms as well as iron deficiency anemia.  She underwent upper and lower endoscopy since then without obvious cause for her anemia identified.  Capsule endoscopy was recommended but never performed.  However, hemoglobin has normalized.  Monica Oliver denies melena and hematochezia   Past Medical History:  Diagnosis Date   Allergy     environmental   Arthritis    Asthma    CAD (coronary artery disease)    a. 08/2018 Cardiac CTA: CAC score 306 (91st %'ile). LM nl, LAD nl, LCX small w/ <50% mid stenosis. RI nl, RCA <50% mid, 51-69% distal (CTFFR of rPL 0.52); b. 12/2020 Cath: LM nl, LAD nl, D2 20, LCX nl, RCA 20p/m, RPAV 85. EF 55-65%-->Med Rx.   Claudication ([place])    a. 08/2019 ABI's nl.   COPD (chronic obstructive pulmonary disease) (HCC)    Diabetes mellitus without complication (HCC)    Diastolic dysfunction    a. 10/2018 Echo: EF 55-60%, Gr1 DD. Nl RV size/fxn.   GERD (gastroesophageal reflux disease)    Hyperlipidemia    Hypertension    Sleep apnea    C-Pap machine   Tobacco abuse     Past Surgical History:  Procedure Laterality Date   [place]   COLONOSCOPY WITH PROPOFOL N/A 02/02/2021   Procedure: COLONOSCOPY WITH PROPOFOL;  Surgeon: [name], MD;  Location: [facility] ENDOSCOPY;  Service: Endoscopy;  Laterality: N/A;   ESOPHAGOGASTRODUODENOSCOPY (EGD) WITH PROPOFOL N/A 02/02/2021   Procedure: ESOPHAGOGASTRODUODENOSCOPY (EGD) WITH PROPOFOL;  Surgeon: [name], MD;  Location: [facility] ENDOSCOPY;  Service: Endoscopy;  Laterality: N/A;   LEFT HEART CATH AND CORONARY ANGIOGRAPHY Left 12/29/2020   Procedure: LEFT HEART CATH AND CORONARY ANGIOGRAPHY;  Surgeon: [name], MD;  Location: [place] [facility];  Service: Cardiovascular;  Laterality: Left;     Home Medications:  Prior to Admission medications   Medication Sig Start Date Xavion Muscat Date Taking? Authorizing Provider  albuterol (PROVENTIL) (2.5 MG/3ML) 0.083% nebulizer solution TAKE 3 MLS (2.5 MG TOTAL) BY NEBULIZATION EVERY 6 HOURS AS NEEDED FOR WHEEZING OR SHORTNESS OF BREATH. 12/25/20  Yes Kasa, Kurian, MD  albuterol (VENTOLIN HFA) 108 (90 Base) MCG/ACT inhaler INHALE 2 PUFFS BY MOUTH EVERY 4 HOURS AS NEEDED 08/14/20 09/23/21 Yes Kasa, Maretta Bees, MD  amLODipine (NORVASC) 2.5 MG tablet  Take 1 tablet (2.5 mg total) by mouth daily. 08/16/21  Yes Alwin Lanigan, Harrell Gave, MD  aspirin 81 MG tablet Take 81 mg by mouth daily.   Yes [provider]  budesonide-formoterol (SYMBICORT) 160-4.5 MCG/ACT inhaler INHALE 2 PUFFS BY MOUTH TWO TIMES DAILY 03/18/21 03/18/22 Yes Kasa, Maretta Bees, MD  cetirizine (ZYRTEC) 10 MG tablet Take 10 mg by mouth daily as needed for allergies.   Yes [provider]  cholecalciferol (VITAMIN D3) 25 MCG (1000 UNIT) tablet Take 1,000 Units by mouth daily.   Yes [provider]  ezetimibe (ZETIA) 10 MG tablet Take 1 tablet (10 mg total) by mouth daily. PLEASE CALL TO SCHEDULE OFFICE VISIT FOR FURTHER REFILLS. THANK YOU! 07/20/21  Yes Nhyla Nappi, Harrell Gave, MD  ferrous sulfate 325 (65 FE) MG tablet TAKE 1 TABLET BY MOUTH EVERY OTHER DAY FOR ANEMIA (SPANISH) 12/17/20 12/17/21 Yes Llamas, Jewel C, MD  fluticasone (FLONASE) 50 MCG/ACT nasal spray Place 1 spray into both nostrils daily as needed for allergies or rhinitis. 02/15/21 02/15/22 Yes Magdalen Spatz, NP  gabapentin (NEURONTIN) 300 MG capsule Take 300 mg by mouth 2 (two) times daily.   Yes [provider]  glucose blood (FREESTYLE LITE) test strip Test once a day as directed 03/25/21  Yes   hydrocortisone cream 1 % Apply 1 application topically 2 (two) times daily as needed for itching.   Yes [provider]  lisinopril-hydrochlorothiazide (ZESTORETIC) 20-12.5 MG tablet TAKE 1 TABLET BY MOUTH DAILY. 10/21/20  Yes Aleck Locklin, Harrell Gave, MD  metFORMIN (GLUCOPHAGE-XR) 500 MG 24 hr tablet Take 2 tablets by mouth twice a day for diabetes 10/22/20  Yes   metoprolol tartrate (LOPRESSOR) 25 MG tablet Take 1 tablet (25 mg total) by mouth 2 (two) times daily. 07/20/21  Yes Binnie Droessler, Harrell Gave, MD  omeprazole (PRILOSEC) 20 MG capsule TAKE 1 CAPSULE BY MOUTH ONCE A DAY AS NEEDED FOR HEARTBURN 06/24/21  Yes   Tiotropium Bromide Monohydrate (SPIRIVA RESPIMAT) 1.25 MCG/ACT AERS INHALE 2 PUFFS INTO THE LUNGS DAILY.  03/18/21 03/18/22 Yes Kasa, Maretta Bees, MD  tiZANidine (ZANAFLEX) 2 MG tablet Take 1 tablet every 6 hours by oral route as needed. 03/26/21  Yes   TRUE METRIX BLOOD GLUCOSE TEST test strip  02/19/16  Yes [provider]  zinc gluconate 50 MG tablet Take 50 mg by mouth daily.   Yes [provider]  nitroGLYCERIN (NITROSTAT) 0.4 MG SL tablet Place 1 tablet (0.4 mg total) under the tongue every 5 (five) minutes as needed for chest pain. Maximum of 3 doses. 03/26/21   Derryck Shahan, Harrell Gave, MD  Zoster Vaccine Adjuvanted [facility]) injection Inject into the muscle. Patient not taking: Reported on 09/23/2021 08/31/21   Carlyle Basques, MD    Inpatient Medications: Scheduled Meds:  [facility] Hold] amLODipine  2.5 mg Oral Daily   [MAR Hold] vitamin C  500 mg Oral Daily   [MAR Hold] aspirin EC  81 mg Oral Daily   [MAR Hold] cholecalciferol  1,000 Units Oral Daily   [MAR Hold] ezetimibe  10 mg Oral Daily   [  MAR Hold] ferrous sulfate  325 mg Oral Q breakfast   [MAR Hold] gabapentin  300 mg Oral BID   [MAR Hold] lisinopril  20 mg Oral Daily   And   [MAR Hold] hydrochlorothiazide  12.5 mg Oral Daily   [MAR Hold] insulin aspart  0-6 Units Subcutaneous Q4H   [MAR Hold] loratadine  10 mg Oral Daily   [MAR Hold] metoprolol tartrate  25 mg Oral BID   [MAR Hold] pantoprazole  40 mg Oral Daily   sodium chloride flush  3 mL Intravenous Q12H   [MAR Hold] zinc sulfate  220 mg Oral Daily   Continuous Infusions:  sodium chloride     sodium chloride 75 mL/hr at 09/24/21 0524   sodium chloride Stopped (09/24/21 0524)   heparin 1,000 Units/hr (09/24/21 1046)   [MAR Hold] remdesivir 100 mg in NS 100 mL 100 mg (09/24/21 1044)   PRN Meds: sodium chloride, [MAR Hold] acetaminophen, [MAR Hold] albuterol, [MAR Hold] chlorpheniramine-HYDROcodone, [MAR Hold] fluticasone, [MAR Hold] guaiFENesin-dextromethorphan, [MAR Hold] hydrocortisone cream, [MAR Hold] magnesium hydroxide, [MAR Hold] nitroGLYCERIN, sodium chloride  flush, [MAR Hold] traZODone  Allergies:    Allergies  Allergen Reactions   Penicillins Other (See Comments)    Unsure- she's had it for so long   Statins Other (See Comments)    Muscle Aches    Social History:   Social History   Socioeconomic History   Marital status: Married    Spouse name: Not on file   Number of children: 3   Years of education: Not on file   Highest education level: Not on file  Occupational History   Occupation: CNA  Tobacco Use   Smoking status: Every Day    Packs/day: 0.75    Years: 40.00    Pack years: 30.00    Types: Cigarettes    Start date: 12/15/1971   Smokeless tobacco: Never   Tobacco comments:    1 cig daily--01/26/2021  Vaping Use   Vaping Use: Never used  Substance and Sexual Activity   Alcohol use: No    Alcohol/week: 0.0 standard drinks   Drug use: No   Sexual activity: Never  Other Topics Concern   Not on file  Social History Narrative   Not on file   Social Determinants of Health   Financial Resource Strain: Not on file  Food Insecurity: Not on file  Transportation Needs: Not on file  Physical Activity: Not on file  Stress: Not on file  Social Connections: Not on file  Intimate Partner Violence: Not on file    Family History:   Family History  Problem Relation Age of Onset   Hypertension Mother    Cancer Mother        lung   Cancer Father        prostate   Hyperlipidemia Father    Heart attack Father 66   Cancer Brother        prostate   Cirrhosis Sister    Hepatitis Sister    Breast cancer Paternal Aunt      ROS:  Please see the history of present illness. All other ROS reviewed and negative.     Physical Exam/Data:   Vitals:   09/24/21 0412 09/24/21 0430 09/24/21 0630 09/24/21 1042  BP: (!) 127/58 (!) 127/57 127/62 134/66  Pulse: 88 80 80 82  Resp: (!) 22 (!) 21 19 19   Temp:      TempSrc:      SpO2: 94% 96% 94% 98%  Intake/Output Summary (Last 24 hours) at 09/24/2021 1155 Last data filed at  09/23/2021 2224 Gross per 24 hour  Intake 3 ml  Output --  Net 3 ml   Last 3 Weights 09/23/2021 09/15/2021 06/30/2021  Weight (lbs) 177 lb 2 oz 172 lb 176 lb 3.2 oz  Weight (kg) 80.343 kg 78.019 kg 79.924 kg     There is no height or weight on file to calculate BMI.  General:  Well nourished, well developed, in no acute distress HEENT: normal Neck: no JVD Vascular: No carotid bruits; Distal pulses 2+ bilaterally Cardiac:  normal S1, S2; RRR; no murmurs, rubs, or gallops Lungs:  clear to auscultation bilaterally, no wheezing, rhonchi or rales  Abd: soft, nontender, no hepatomegaly  Ext: no edema Musculoskeletal:  No deformities, BUE and BLE strength normal and equal Skin: warm and dry  Neuro:  CNs 2-12 intact, no focal abnormalities noted Psych:  Normal affect   EKG:  The EKG was personally reviewed and demonstrates: Normal sinus rhythm with low voltage. Telemetry:  Telemetry was personally reviewed and demonstrates: Sinus rhythm  Relevant CV Studies: LHC (12/29/2020): Severe single-vessel coronary artery disease with 80 to 90% stenosis of the RPAV.  There is mild, nonobstructive disease involving the mid RCA and ostium of D2. Normal left ventricular systolic function with mildly elevated filling pressure.  Laboratory Data:  High Sensitivity Troponin:   Recent Labs  Lab 09/15/21 2147 09/23/21 1435 09/23/21 1856 09/23/21 2203 09/24/21 0017  TROPONINIHS 4 4 4 5 6      Chemistry Recent Labs  Lab 09/23/21 1435 09/24/21 0840  NA 132* 134*  K 3.5 3.5  CL 97* 100  CO2 27 25  GLUCOSE 116* 95  BUN 14 10  CREATININE 0.77 0.77  CALCIUM 9.4 9.0  GFRNONAA >60 >60  ANIONGAP 8 9    Recent Labs  Lab 09/24/21 0840  PROT 6.9  ALBUMIN 3.6  AST 21  ALT 14  ALKPHOS 67  BILITOT 0.5   Lipids No results for input(s): CHOL, TRIG, HDL, LABVLDL, LDLCALC, CHOLHDL in the last 168 hours.  Hematology Recent Labs  Lab 09/23/21 1435 09/24/21 0840  WBC 12.0* 6.2  RBC 4.68 4.50   HGB 12.7 11.8*  HCT 39.2 37.4  MCV 83.8 83.1  MCH 27.1 26.2  MCHC 32.4 31.6  RDW 15.0 15.3  PLT 374 336   Thyroid No results for input(s): TSH, FREET4 in the last 168 hours.  BNP Recent Labs  Lab 09/23/21 2203  BNP 18.0    DDimer  Recent Labs  Lab 09/23/21 2203 09/24/21 0840  DDIMER 0.88* 0.62*     Radiology/Studies:  DG Chest 2 View  Result Date: 09/23/2021 CLINICAL DATA:  Chest pain over the last several days. Shortness of breath. EXAM: CHEST - 2 VIEW COMPARISON:  Two-view chest x-ray 09/15/2021 FINDINGS: Heart size is normal. Lung volumes are low. Mild pulmonary vascular congestion is noted. No significant edema or effusion is present. Axial skeleton is otherwise unremarkable. IMPRESSION: Low lung volumes and mild pulmonary vascular congestion. Electronically Signed   By: San Morelle M.D.   On: 09/23/2021 15:08     Assessment and Plan:   Unstable angina: Monica Oliver has a history of severe single-vessel CAD seen on prior cath and coronary CTA.  Her symptoms have progressed over the last few weeks and are now present at rest.  Additional physiologic stress from COVID-19 infection may be contributing to this.  We have discussed further management options and have agreed  to proceed with repeat catheterization and likely PCI to the RPA V branch.  I will load her with clopidogrel.  She should continue aspirin and IV heparin pending catheterization.  Secondary prevention with statin therapy recommended.  COVID-19 infection: Symptoms are relatively minimal.  Elevated D-dimer noted.  Presentation most consistent with unstable angina, though will defer to primary team as to whether further work-up for PE is indicated.  Continue management per IM.  Hypertension: Blood pressure well controlled.  Hyperlipidemia associated with type 2 diabetes mellitus: Continue ezetimibe given history of statin intolerance.  Will need to consider transitioning to PCSK9 inhibitor as an  outpatient.  Shared Decision Making/Informed Consent The risks [stroke (1 in 1000), death (1 in 1000), kidney failure [usually temporary] (1 in 500), bleeding (1 in 200), allergic reaction [possibly serious] (1 in 200)], benefits (diagnostic support and management of coronary artery disease) and alternatives of a cardiac catheterization were discussed in detail with Monica Oliver and she is willing to proceed.  Risk Assessment/Risk Scores:     TIMI Risk Score for Unstable Angina or Non-ST Elevation MI:   The patient's TIMI risk score is 5, which indicates a 26% risk of all cause mortality, new or recurrent myocardial infarction or need for urgent revascularization in the next 14 days.  For questions or updates, please contact [place] Please consult [email or web address] for contact info under [facility].  Signed, [name], MD  09/24/2021 11:55 AM

## 2021-09-24 NOTE — ED Notes (Signed)
Called dietary for clear liquid diet tray

## 2021-09-25 ENCOUNTER — Other Ambulatory Visit: Payer: Self-pay

## 2021-09-25 DIAGNOSIS — I10 Essential (primary) hypertension: Secondary | ICD-10-CM

## 2021-09-25 DIAGNOSIS — R079 Chest pain, unspecified: Secondary | ICD-10-CM | POA: Diagnosis not present

## 2021-09-25 DIAGNOSIS — U071 COVID-19: Secondary | ICD-10-CM

## 2021-09-25 DIAGNOSIS — I200 Unstable angina: Secondary | ICD-10-CM | POA: Diagnosis not present

## 2021-09-25 LAB — COMPREHENSIVE METABOLIC PANEL
ALT: 19 U/L (ref 0–44)
AST: 30 U/L (ref 15–41)
Albumin: 3.7 g/dL (ref 3.5–5.0)
Alkaline Phosphatase: 63 U/L (ref 38–126)
Anion gap: 9 (ref 5–15)
BUN: 17 mg/dL (ref 8–23)
CO2: 29 mmol/L (ref 22–32)
Calcium: 9.1 mg/dL (ref 8.9–10.3)
Chloride: 98 mmol/L (ref 98–111)
Creatinine, Ser: 0.8 mg/dL (ref 0.44–1.00)
GFR, Estimated: >60 mL/min (ref >60)
Glucose, Bld: 113 mg/dL — ABNORMAL HIGH (ref 70–99)
Potassium: 3.5 mmol/L (ref 3.5–5.1)
Sodium: 136 mmol/L (ref 135–145)
Total Bilirubin: 0.4 mg/dL (ref 0.3–1.2)
Total Protein: 6.7 g/dL (ref 6.5–8.1)

## 2021-09-25 LAB — CBC WITH DIFFERENTIAL/PLATELET
Abs Immature Granulocytes: 0.01 10*3/uL (ref 0.00–0.07)
Basophils Absolute: 0.1 10*3/uL (ref 0.0–0.1)
Basophils Relative: 1 %
Eosinophils Absolute: 0.1 10*3/uL (ref 0.0–0.5)
Eosinophils Relative: 2 %
HCT: 35.3 % — ABNORMAL LOW (ref 36.0–46.0)
Hemoglobin: 11.4 g/dL — ABNORMAL LOW (ref 12.0–15.0)
Immature Granulocytes: 0 %
Lymphocytes Relative: 28 %
Lymphs Abs: 1.7 10*3/uL (ref 0.7–4.0)
MCH: 26.5 pg (ref 26.0–34.0)
MCHC: 32.3 g/dL (ref 30.0–36.0)
MCV: 82.1 fL (ref 80.0–100.0)
Monocytes Absolute: 0.9 10*3/uL (ref 0.1–1.0)
Monocytes Relative: 14 %
Neutro Abs: 3.3 10*3/uL (ref 1.7–7.7)
Neutrophils Relative %: 55 %
Platelets: 325 10*3/uL (ref 150–400)
RBC: 4.3 MIL/uL (ref 3.87–5.11)
RDW: 15.4 % (ref 11.5–15.5)
WBC: 6 10*3/uL (ref 4.0–10.5)
nRBC: 0 % (ref 0.0–0.2)

## 2021-09-25 LAB — C-REACTIVE PROTEIN: CRP: 6.2 mg/dL — ABNORMAL HIGH (ref <1.0)

## 2021-09-25 LAB — GLUCOSE, CAPILLARY
Glucose-Capillary: 125 mg/dL — ABNORMAL HIGH (ref 70–99)
Glucose-Capillary: 122 mg/dL — ABNORMAL HIGH (ref 70–99)
Glucose-Capillary: 99 mg/dL (ref 70–99)
Glucose-Capillary: 129 mg/dL — ABNORMAL HIGH (ref 70–99)
Glucose-Capillary: 111 mg/dL — ABNORMAL HIGH (ref 70–99)

## 2021-09-25 LAB — D-DIMER, QUANTITATIVE: D-Dimer, Quant: 0.64 ug/mL-FEU — ABNORMAL HIGH (ref 0.00–0.50)

## 2021-09-25 LAB — FERRITIN: Ferritin: 35 ng/mL (ref 11–307)

## 2021-09-25 MED ORDER — AMLODIPINE BESYLATE 5 MG PO TABS
5.0000 mg | ORAL_TABLET | Freq: Every day | ORAL | 0 refills | Status: DC
  Filled 2021-09-25: qty 30, 30d supply, fill #0

## 2021-09-25 MED ORDER — CLOPIDOGREL BISULFATE 75 MG PO TABS
75.0000 mg | ORAL_TABLET | Freq: Every day | ORAL | 0 refills | Status: AC
  Filled 2021-09-25: qty 30, 30d supply, fill #0

## 2021-09-25 MED ORDER — CLOPIDOGREL BISULFATE 75 MG PO TABS: 75.0000 mg | ORAL_TABLET | Freq: Every day | ORAL | 0 refills | Status: DC

## 2021-09-25 MED ORDER — AMLODIPINE BESYLATE 5 MG PO TABS: 5.0000 mg | ORAL_TABLET | Freq: Every day | ORAL | 0 refills | Status: DC

## 2021-09-25 NOTE — Progress Notes (Signed)
Progress Note  Patient Name: Monica Oliver Date of Encounter: 09/25/2021  [facility] Cardiologist: [name], MD   Subjective   Denies chest pain or shortness of breath.  Echocardiogram still pending.  Inpatient Medications    Scheduled Meds:  amLODipine  5 mg Oral Daily   vitamin C  500 mg Oral Daily   aspirin EC  81 mg Oral Daily   cholecalciferol  1,000 Units Oral Daily   clopidogrel  75 mg Oral Q breakfast   enoxaparin (LOVENOX) injection  40 mg Subcutaneous Q24H   ezetimibe  10 mg Oral Daily   ferrous sulfate  325 mg Oral Q breakfast   furosemide  20 mg Intravenous Daily   gabapentin  300 mg Oral BID   insulin aspart  0-6 Units Subcutaneous Q4H   loratadine  10 mg Oral Daily   metoprolol tartrate  25 mg Oral BID   pantoprazole  40 mg Oral Daily   potassium chloride  40 mEq Oral Once   sodium chloride flush  3 mL Intravenous Q12H   zinc sulfate  220 mg Oral Daily   Continuous Infusions:  sodium chloride     remdesivir 100 mg in NS 100 mL 100 mg (09/25/21 0928)   PRN Meds: sodium chloride, acetaminophen, albuterol, chlorpheniramine-HYDROcodone, fluticasone, guaiFENesin-dextromethorphan, hydrocortisone cream, magnesium hydroxide, nitroGLYCERIN, sodium chloride flush, traZODone   Vital Signs    Vitals:   09/25/21 0054 09/25/21 0517 09/25/21 0825 09/25/21 1205  BP: (!) 118/54 (!) 130/47 122/61 (!) 117/55  Pulse: 88 83 82 74  Resp: 18 18 18 18   Temp: 99.5 F (37.5 C) 98.2 F (36.8 C) 98.3 F (36.8 C) 98.5 F (36.9 C)  TempSrc: Oral Oral Oral Oral  SpO2: 97% 96% 98% 95%  Weight:      Height:        Intake/Output Summary (Last 24 hours) at 09/25/2021 1334 Last data filed at 09/25/2021 1100 Gross per 24 hour  Intake 123 ml  Output 2350 ml  Net -2227 ml   Last 3 Weights 09/24/2021 09/23/2021 09/15/2021  Weight (lbs) 171 lb 15.3 oz 177 lb 2 oz 172 lb  Weight (kg) 78 kg 80.343 kg 78.019 kg      Telemetry    Sinus rhythm- Personally  Reviewed  ECG     - Personally Reviewed  Physical Exam   GEN: No acute distress.   Neck: No JVD Cardiac: RRR, no murmurs, rubs, or gallops.  Respiratory: Decreased breath sounds bilaterally. GI: Soft, nontender, non-distended  MS: No edema; No deformity. Neuro:  Nonfocal  Psych: Normal affect   Labs    High Sensitivity Troponin:   Recent Labs  Lab 09/15/21 2147 09/23/21 1435 09/23/21 1856 09/23/21 2203 09/24/21 0017  TROPONINIHS 4 4 4 5 6      Chemistry Recent Labs  Lab 09/23/21 1435 09/24/21 0840 09/25/21 0637  NA 132* 134* 136  K 3.5 3.5 3.5  CL 97* 100 98  CO2 27 25 29   GLUCOSE 116* 95 113*  BUN 14 10 17   CREATININE 0.77 0.77 0.80  CALCIUM 9.4 9.0 9.1  PROT  --  6.9 6.7  ALBUMIN  --  3.6 3.7  AST  --  21 30  ALT  --  14 19  ALKPHOS  --  67 63  BILITOT  --  0.5 0.4  GFRNONAA >60 >60 >60  ANIONGAP 8 9 9     Lipids No results for input(s): CHOL, TRIG, HDL, LABVLDL, LDLCALC, CHOLHDL in the  last 168 hours.  Hematology Recent Labs  Lab 09/23/21 1435 09/24/21 0840 09/25/21 0637  WBC 12.0* 6.2 6.0  RBC 4.68 4.50 4.30  HGB 12.7 11.8* 11.4*  HCT 39.2 37.4 35.3*  MCV 83.8 83.1 82.1  MCH 27.1 26.2 26.5  MCHC 32.4 31.6 32.3  RDW 15.0 15.3 15.4  PLT 374 336 325   Thyroid No results for input(s): TSH, FREET4 in the last 168 hours.  BNP Recent Labs  Lab 09/23/21 2203  BNP 18.0    DDimer  Recent Labs  Lab 09/23/21 2203 09/24/21 0840 09/25/21 0637  DDIMER 0.88* 0.62* 0.64*     Radiology    DG Chest 2 View  Result Date: 09/23/2021 CLINICAL DATA:  Chest pain over the last several days. Shortness of breath. EXAM: CHEST - 2 VIEW COMPARISON:  Two-view chest x-ray 09/15/2021 FINDINGS: Heart size is normal. Lung volumes are low. Mild pulmonary vascular congestion is noted. No significant edema or effusion is present. Axial skeleton is otherwise unremarkable. IMPRESSION: Low lung volumes and mild pulmonary vascular congestion. Electronically Signed    By: San Morelle M.D.   On: 09/23/2021 15:08   CARDIAC CATHETERIZATION  Result Date: 09/24/2021 Conclusions: Severe single-vessel coronary artery disease with 90% stenosis in the proximal RPAV branch, similar to prior catheterization in April. No significant CAD involving the left coronary artery. Vigorous left ventricular contraction with moderately elevated filling pressure (LVEF greater than 65%, LVEDP 25-30 mmHg). Unsuccessful PCI to RPAV due to resistance of the lesion and inability to cross the stenosis completely with a 1.5 x 10 mm balloon.  Intervention was complicated by guide extension dissection of the distal RCA necessitating stent placement.  Final angiogram shows stable 90% stenosis of the RPAV and complete resolution of distal RCA dissection with TIMI-3 flow. Recommendations: Continue dual antiplatelet therapy with aspirin and clopidogrel for at least 12 months. Escalate antianginal therapy; will continue metoprolol tartrate 25 mg twice daily and increase amlodipine to 5 mg daily.  Patient has previously been intolerant of isosorbide mononitrate but may need to be rechallenged with this in the future. Aggressive secondary prevention of coronary artery disease.  Continue ezetimibe for now but consider PCSK9 inhibitor as an outpatient. If patient has refractory angina, may require reattempted PCI at [facility] from a femoral approach versus consideration of single-vessel bypass. [name], MD [facility]   Cardiac Studies   [facility] 09/24/2021 Conclusions: Severe single-vessel coronary artery disease with 90% stenosis in the proximal RPAV branch, similar to prior catheterization in April. No significant CAD involving the left coronary artery. Vigorous left ventricular contraction with moderately elevated filling pressure (LVEF greater than 65%, LVEDP 25-30 mmHg). Unsuccessful PCI to RPAV due to resistance of the lesion and inability to cross the stenosis completely with a 1.5 x  10 mm balloon.  Intervention was complicated by guide extension dissection of the distal RCA necessitating stent placement.  Final angiogram shows stable 90% stenosis of the RPAV and complete resolution of distal RCA dissection with TIMI-3 flow.   Recommendations: Continue dual antiplatelet therapy with aspirin and clopidogrel for at least 12 months. Escalate antianginal therapy; will continue metoprolol tartrate 25 mg twice daily and increase amlodipine to 5 mg daily.  Patient has previously been intolerant of isosorbide mononitrate but may need to be rechallenged with this in the future. Aggressive secondary prevention of coronary artery disease.  Continue ezetimibe for now but consider PCSK9 inhibitor as an outpatient. If patient has refractory angina, may require reattempted PCI at [facility] from a femoral approach versus consideration of single-vessel bypass.  Patient Profile     70 y.o. female history of CAD (prior significant RPAV stenosis), hypertension, hyperlipidemia, OSA, chronic smoker presenting with chest pain.  Diagnosed with unstable angina left heart cath showing RPAV stenosis, procedure complicated by distal RCA dissection requiring PCI/stent.  Assessment & Plan    Chestpain, CAD -PCI to distal RCA 2/2 dissection.  Prior stenosis of RPAV still present, not able to cross. -Currently denies chest pain -Aspirin, Plavix x12 months -Lopressor for antianginal benefit -Previous intolerance to Imdur.  If patient develops chest pain, consider Ranexa. -Continue Zetia, consider PCSK9 as outpatient. -Echocardiogram is pending, patient anxious about going home.  Okay to discharge patient on current medications if no further symptoms of chest pain.  Close follow-up as outpatient advised.  Echocardiogram can be performed as outpatient.  Prior echocardiogram 2020 showed preserved EF.  2.  COVID-19 infection -Management as per medicine team  Total encounter time 35 minutes  Greater than  50% was spent in counseling and coordination of care with the patient     Signed, Kate Sable, MD  09/25/2021, 1:34 PM

## 2021-09-25 NOTE — Discharge Summary (Signed)
Physician Discharge Summary  Monica Oliver CBJ:[id number] DOB: Jan 29, 1951 DOA: 09/23/2021  PCP: Monica Brock, MD  Admit date: 09/23/2021 Discharge date: 09/25/2021  Admitted From: home  Disposition:  home   Recommendations for Outpatient Follow-up:  Follow up with PCP in 1-2 weeks F/u w/ cardio, Dr. Saunders Revel, w/in 1 week   Home Health: no  Equipment/Devices:  Discharge Condition: stable  CODE STATUS: full  Diet recommendation: Heart Healthy / Carb Modified    Brief/Interim Summary: HPI was taken from Dr. Hal Oliver:  Monica Oliver Ades is a 70 y.o. female with history of CAD status post PCI COPD, diabetes mellitus type 2, diastolic dysfunction, sleep apnea has been experiencing left-sided anterior chest wall pain off and on for the last 3 weeks which has become more persistent and has been frequent.  Patient had gone to cardiologist office today and over the patient had another episode of chest pain which was completely relieved by sublingual nitroglycerin.  Patient's symptoms were concerning for unstable angina and patient was advised to come to the ER.  Patient today also had increasing cough which was nonproductive.  Patient states he has chronic shortness of breath from COPD.   ED Course: In the ER patient was chest pain-free.  Chest x-ray unremarkable EKG shows normal sinus rhythm.  High sensitive troponins were negative.  WBC count was 12.  COVID test was positive.  Patient was started on heparin and remdesivir admitted for further management.   Hospital course from Dr. Jimmye Norman 09/25/21: Pt presented w/ unstable angina and is s/p cardiac cath in which PCI to distal RCA secondary to dissection w/ prior stenosis of RPAV still present , not able to cross as per cardio. Pt was d/c home w/ aspirin, plavix, metoprolol, zetia, amlodipine & continue on home dose of lisinopril-HCTZ. Pt had previous intolerance to imdur as per cardio. Pt will f/u w/ cardio, Dr. Saunders Revel, within 1 week. For more  information, please see previous progress/consult notes.   Discharge Diagnoses:  Principal Problem:   Unstable angina (HCC)  Unstable angina: w/ hx of CAD. S/p cardiac cath in which PCI to distal RCA 2/2 dissection w/  prior stenosis of RPAV still present, not able to cross as per cardio. Continue on aspirin, plavix, metoprolol, amlodipine & zetia   COVID19: continue on IV remdesivir, bronchodilators and encourage incentive spirometry   HTN: continue on amlodipine, metoprolol   DM2: likely poorly controlled. Continue on SSI w/ accuchecks   COPD: w/o exacerbation. Continue on bronchodilators and encourage incentive spirometry  OSA: CPAP qhs   Discharge Instructions  Discharge Instructions     AMB Referral to Cardiac Rehabilitation - Phase II   Complete by: As directed    Diagnosis: Coronary Stents   After initial evaluation and assessments completed: Virtual Based Care may be provided alone or in conjunction with Phase 2 Cardiac Rehab based on patient barriers.: Yes   Diet - low sodium heart healthy   Complete by: As directed    Diet Carb Modified   Complete by: As directed    Discharge instructions   Complete by: As directed    F/u w/ cardio, Dr. Saunders Revel, w/in 1 week. F/u w/ PCP in 1-2 weeks   Increase activity slowly   Complete by: As directed       Allergies as of 09/25/2021       Reactions   Penicillins Other (See Comments)   Unsure- she's had it for so long   Statins Other (See Comments)   Muscle  Aches        Medication List     STOP taking these medications    Shingrix injection Generic drug: Zoster Vaccine Adjuvanted       TAKE these medications    albuterol 108 (90 Base) MCG/ACT inhaler Commonly known as: VENTOLIN HFA INHALE 2 PUFFS BY MOUTH EVERY 4 HOURS AS NEEDED   albuterol (2.5 MG/3ML) 0.083% nebulizer solution Commonly known as: PROVENTIL TAKE 3 MLS (2.5 MG TOTAL) BY NEBULIZATION EVERY 6 HOURS AS NEEDED FOR WHEEZING OR SHORTNESS OF  BREATH.   amLODipine 5 MG tablet Commonly known as: NORVASC Take 1 tablet (5 mg total) by mouth daily. Start taking on: September 26, 2021 What changed:  medication strength how much to take   aspirin 81 MG tablet Take 81 mg by mouth daily.   cetirizine 10 MG tablet Commonly known as: ZYRTEC Take 10 mg by mouth daily as needed for allergies.   cholecalciferol 25 MCG (1000 UNIT) tablet Commonly known as: VITAMIN D3 Take 1,000 Units by mouth daily.   clopidogrel 75 MG tablet Commonly known as: PLAVIX Take 1 tablet (75 mg total) by mouth daily with breakfast. Start taking on: September 26, 2021   ezetimibe 10 MG tablet Commonly known as: ZETIA Take 1 tablet (10 mg total) by mouth daily. PLEASE CALL TO SCHEDULE OFFICE VISIT FOR FURTHER REFILLS. THANK YOU!   ferrous sulfate 325 (65 FE) MG tablet TAKE 1 TABLET BY MOUTH EVERY OTHER DAY FOR ANEMIA (SPANISH)   fluticasone 50 MCG/ACT nasal spray Commonly known as: FLONASE Place 1 spray into both nostrils daily as needed for allergies or rhinitis.   gabapentin 300 MG capsule Commonly known as: NEURONTIN Take 300 mg by mouth 2 (two) times daily.   hydrocortisone cream 1 % Apply 1 application topically 2 (two) times daily as needed for itching.   lisinopril-hydrochlorothiazide 20-12.5 MG tablet Commonly known as: ZESTORETIC TAKE 1 TABLET BY MOUTH DAILY.   metFORMIN 500 MG 24 hr tablet Commonly known as: GLUCOPHAGE-XR Take 2 tablets by mouth twice a day for diabetes   metoprolol tartrate 25 MG tablet Commonly known as: LOPRESSOR Take 1 tablet (25 mg total) by mouth 2 (two) times daily.   nitroGLYCERIN 0.4 MG SL tablet Commonly known as: Nitrostat Place 1 tablet (0.4 mg total) under the tongue every 5 (five) minutes as needed for chest pain. Maximum of 3 doses.   omeprazole 20 MG capsule Commonly known as: PRILOSEC TAKE 1 CAPSULE BY MOUTH ONCE A DAY AS NEEDED FOR HEARTBURN   Spiriva Respimat 1.25 MCG/ACT Aers Generic drug:  Tiotropium Bromide Monohydrate INHALE 2 PUFFS INTO THE LUNGS DAILY.   Symbicort 160-4.5 MCG/ACT inhaler Generic drug: budesonide-formoterol INHALE 2 PUFFS BY MOUTH TWO TIMES DAILY   tiZANidine 2 MG tablet Commonly known as: ZANAFLEX Take 1 tablet by mouth every 6 hours as needed (Take 1 tablet every 6 hours by oral route as needed.)   True Metrix Blood Glucose Test test strip Generic drug: glucose blood   glucose blood test strip Commonly known as: FREESTYLE LITE Test once a day as directed   zinc gluconate 50 MG tablet Take 50 mg by mouth daily.        Allergies  Allergen Reactions   Penicillins Other (See Comments)    Unsure- she's had it for so long   Statins Other (See Comments)    Muscle Aches    Consultations: Cardio    Procedures/Studies: DG Chest 2 View  Result Date: 09/23/2021 CLINICAL DATA:  Chest pain over the last several days. Shortness of breath. EXAM: CHEST - 2 VIEW COMPARISON:  Two-view chest x-ray 09/15/2021 FINDINGS: Heart size is normal. Lung volumes are low. Mild pulmonary vascular congestion is noted. No significant edema or effusion is present. Axial skeleton is otherwise unremarkable. IMPRESSION: Low lung volumes and mild pulmonary vascular congestion. Electronically Signed   By: San Morelle M.D.   On: 09/23/2021 15:08   DG Chest 2 View  Result Date: 09/15/2021 CLINICAL DATA:  Chest pain EXAM: CHEST - 2 VIEW COMPARISON:  11/28/2017 FINDINGS: Minimal left basilar scarring. Lungs are otherwise clear. No pneumothorax or pleural effusion. Cardiac size within normal limits. Pulmonary vascularity is normal. No acute bone abnormality. IMPRESSION: No active cardiopulmonary disease. Electronically Signed   By: Fidela Salisbury M.D.   On: 09/15/2021 18:34   CARDIAC CATHETERIZATION  Result Date: 09/24/2021 Conclusions: Severe single-vessel coronary artery disease with 90% stenosis in the proximal RPAV branch, similar to prior catheterization in  April. No significant CAD involving the left coronary artery. Vigorous left ventricular contraction with moderately elevated filling pressure (LVEF greater than 65%, LVEDP 25-30 mmHg). Unsuccessful PCI to RPAV due to resistance of the lesion and inability to cross the stenosis completely with a 1.5 x 10 mm balloon.  Intervention was complicated by guide extension dissection of the distal RCA necessitating stent placement.  Final angiogram shows stable 90% stenosis of the RPAV and complete resolution of distal RCA dissection with TIMI-3 flow. Recommendations: Continue dual antiplatelet therapy with aspirin and clopidogrel for at least 12 months. Escalate antianginal therapy; will continue metoprolol tartrate 25 mg twice daily and increase amlodipine to 5 mg daily.  Patient has previously been intolerant of isosorbide mononitrate but may need to be rechallenged with this in the future. Aggressive secondary prevention of coronary artery disease.  Continue ezetimibe for now but consider PCSK9 inhibitor as an outpatient. If patient has refractory angina, may require reattempted PCI at [facility] from a femoral approach versus consideration of single-vessel bypass. [name], MD [facility]  (Echo, Carotid, EGD, Colonoscopy, ERCP)    Subjective: Pt denies any chest pain, shortness of breath.    Discharge Exam: Vitals:   09/25/21 0825 09/25/21 1205  BP: 122/61 (!) 117/55  Pulse: 82 74  Resp: 18 18  Temp: 98.3 F (36.8 C) 98.5 F (36.9 C)  SpO2: 98% 95%   Vitals:   09/25/21 0054 09/25/21 0517 09/25/21 0825 09/25/21 1205  BP: (!) 118/54 (!) 130/47 122/61 (!) 117/55  Pulse: 88 83 82 74  Resp: 18 18 18 18   Temp: 99.5 F (37.5 C) 98.2 F (36.8 C) 98.3 F (36.8 C) 98.5 F (36.9 C)  TempSrc: Oral Oral Oral Oral  SpO2: 97% 96% 98% 95%  Weight:      Height:        General: Pt is alert, awake, not in acute distress Cardiovascular: S1/S2 +, no rubs, no gallops Respiratory: CTA bilaterally,  no wheezing, no rhonchi Abdominal: Soft, NT, obese, bowel sounds + Extremities: no edema, no cyanosis    The results of significant diagnostics from this hospitalization (including imaging, microbiology, ancillary and laboratory) are listed below for reference.     Microbiology: Recent Results (from the past 240 hour(s))  Resp Panel by RT-PCR (Flu A&B, Covid) Nasopharyngeal Swab     Status: Abnormal   Collection Time: 09/23/21  6:56 PM   Specimen: Nasopharyngeal Swab; Nasopharyngeal(NP) swabs in vial transport medium  Result Value Ref Range Status   SARS Coronavirus  2 by RT PCR POSITIVE (A) NEGATIVE Final    Comment: (NOTE) SARS-CoV-2 target nucleic acids are DETECTED.  The SARS-CoV-2 RNA is generally detectable in upper respiratory specimens during the acute phase of infection. Positive results are indicative of the presence of the identified virus, but do not rule out bacterial infection or co-infection with other pathogens not detected by the test. Clinical correlation with patient history and other diagnostic information is necessary to determine patient infection status. The expected result is Negative.  Fact Sheet for Patients: EntrepreneurPulse.com.au  Fact Sheet for Healthcare Providers: IncredibleEmployment.be  This test is not yet approved or cleared by the [place] FDA and  has been authorized for detection and/or diagnosis of SARS-CoV-2 by FDA under an Emergency Use Authorization (EUA).  This EUA will remain in effect (meaning this test can be used) for the duration of  the COVID-19 declaration under Section 564(b)(1) of the A ct, 21 U.S.C. section 360bbb-3(b)(1), unless the authorization is terminated or revoked sooner.     Influenza A by PCR NEGATIVE NEGATIVE Final   Influenza B by PCR NEGATIVE NEGATIVE Final    Comment: (NOTE) The Xpert Xpress SARS-CoV-2/FLU/RSV plus assay is intended as an aid in the diagnosis of  influenza from Nasopharyngeal swab specimens and should not be used as a sole basis for treatment. Nasal washings and aspirates are unacceptable for Xpert Xpress SARS-CoV-2/FLU/RSV testing.  Fact Sheet for Patients: EntrepreneurPulse.com.au  Fact Sheet for Healthcare Providers: IncredibleEmployment.be  This test is not yet approved or cleared by the [place] FDA and has been authorized for detection and/or diagnosis of SARS-CoV-2 by FDA under an Emergency Use Authorization (EUA). This EUA will remain in effect (meaning this test can be used) for the duration of the COVID-19 declaration under Section 564(b)(1) of the Act, 21 U.S.C. section 360bbb-3(b)(1), unless the authorization is terminated or revoked.  Performed at [facility], [address] [place], [place] [address]      Labs: BNP (last 3 results) Recent Labs    09/23/21 2203  BNP 99.8   Basic Metabolic Panel: Recent Labs  Lab 09/23/21 1435 09/24/21 0840 09/25/21 0637  NA 132* 134* 136  K 3.5 3.5 3.5  CL 97* 100 98  CO2 27 25 29   GLUCOSE 116* 95 113*  BUN 14 10 17   CREATININE 0.77 0.77 0.80  CALCIUM 9.4 9.0 9.1   Liver Function Tests: Recent Labs  Lab 09/24/21 0840 09/25/21 0637  AST 21 30  ALT 14 19  ALKPHOS 67 63  BILITOT 0.5 0.4  PROT 6.9 6.7  ALBUMIN 3.6 3.7   No results for input(s): LIPASE, AMYLASE in the last 168 hours. No results for input(s): AMMONIA in the last 168 hours. CBC: Recent Labs  Lab 09/23/21 1435 09/24/21 0840 09/25/21 0637  WBC 12.0* 6.2 6.0  NEUTROABS  --  3.4 3.3  HGB 12.7 11.8* 11.4*  HCT 39.2 37.4 35.3*  MCV 83.8 83.1 82.1  PLT 374 336 325   Cardiac Enzymes: No results for input(s): CKTOTAL, CKMB, CKMBINDEX, TROPONINI in the last 168 hours. BNP: Invalid input(s): POCBNP CBG: Recent Labs  Lab 09/24/21 2042 09/25/21 0100 09/25/21 0519 09/25/21 0829 09/25/21 1203  GLUCAP 121* 125* 122* 99 129*    D-Dimer Recent Labs    09/24/21 0840 09/25/21 0637  DDIMER 0.62* 0.64*   Hgb A1c Recent Labs    09/24/21 0840  HGBA1C 6.6*   Lipid Profile No results for input(s): CHOL, HDL, LDLCALC, TRIG, CHOLHDL, LDLDIRECT in  the last 72 hours. Thyroid function studies No results for input(s): TSH, T4TOTAL, T3FREE, THYROIDAB in the last 72 hours.  Invalid input(s): FREET3 Anemia work up Recent Labs    09/24/21 0840 09/25/21 0637  FERRITIN 21 35   Urinalysis    Component Value Date/Time   BILIRUBINUR negative 03/07/2019 1450   PROTEINUR Negative 03/07/2019 1450   UROBILINOGEN 0.2 03/07/2019 1450   NITRITE negative 03/07/2019 1450   LEUKOCYTESUR Negative 03/07/2019 1450   Sepsis Labs Invalid input(s): PROCALCITONIN,  WBC,  LACTICIDVEN Microbiology Recent Results (from the past 240 hour(s))  Resp Panel by RT-PCR (Flu A&B, Covid) Nasopharyngeal Swab     Status: Abnormal   Collection Time: 09/23/21  6:56 PM   Specimen: Nasopharyngeal Swab; Nasopharyngeal(NP) swabs in vial transport medium  Result Value Ref Range Status   SARS Coronavirus 2 by RT PCR POSITIVE (A) NEGATIVE Final    Comment: (NOTE) SARS-CoV-2 target nucleic acids are DETECTED.  The SARS-CoV-2 RNA is generally detectable in upper respiratory specimens during the acute phase of infection. Positive results are indicative of the presence of the identified virus, but do not rule out bacterial infection or co-infection with other pathogens not detected by the test. Clinical correlation with patient history and other diagnostic information is necessary to determine patient infection status. The expected result is Negative.  Fact Sheet for Patients: EntrepreneurPulse.com.au  Fact Sheet for Healthcare Providers: IncredibleEmployment.be  This test is not yet approved or cleared by the [place] FDA and  has been authorized for detection and/or diagnosis of SARS-CoV-2 by FDA  under an Emergency Use Authorization (EUA).  This EUA will remain in effect (meaning this test can be used) for the duration of  the COVID-19 declaration under Section 564(b)(1) of the A ct, 21 U.S.C. section 360bbb-3(b)(1), unless the authorization is terminated or revoked sooner.     Influenza A by PCR NEGATIVE NEGATIVE Final   Influenza B by PCR NEGATIVE NEGATIVE Final    Comment: (NOTE) The Xpert Xpress SARS-CoV-2/FLU/RSV plus assay is intended as an aid in the diagnosis of influenza from Nasopharyngeal swab specimens and should not be used as a sole basis for treatment. Nasal washings and aspirates are unacceptable for Xpert Xpress SARS-CoV-2/FLU/RSV testing.  Fact Sheet for Patients: EntrepreneurPulse.com.au  Fact Sheet for Healthcare Providers: IncredibleEmployment.be  This test is not yet approved or cleared by the [place] FDA and has been authorized for detection and/or diagnosis of SARS-CoV-2 by FDA under an Emergency Use Authorization (EUA). This EUA will remain in effect (meaning this test can be used) for the duration of the COVID-19 declaration under Section 564(b)(1) of the Act, 21 U.S.C. section 360bbb-3(b)(1), unless the authorization is terminated or revoked.  Performed at [facility], [address], [place], [place] [address]      Time coordinating discharge: Over 30 minutes  SIGNED:   Wyvonnia Dusky, MD  [facility] 09/25/2021, 4:03 PM Pager   If 7PM-7AM, please contact night-coverage

## 2021-09-27 ENCOUNTER — Other Ambulatory Visit: Payer: Self-pay

## 2021-09-28 ENCOUNTER — Telehealth: Payer: Self-pay | Admitting: Internal Medicine

## 2021-09-28 ENCOUNTER — Encounter: Payer: Self-pay | Admitting: Internal Medicine

## 2021-09-28 NOTE — Telephone Encounter (Signed)
Recent admission wants to know when to return to work. Fu scheduled 10-07-20 please advise

## 2021-09-28 NOTE — Telephone Encounter (Signed)
Called and spoke with pt. Notified of Dr. Darnelle Bos message below.  Pt voiced understanding and requests a letter stating this for her work.  Letter has been left at the front desk for pick up.

## 2021-09-28 NOTE — Telephone Encounter (Signed)
Dr. Saunders Revel, please advise. Pt asking when she may return to work after hospital discharge. Pt has follow up scheduled with you 10/07/21.

## 2021-09-28 NOTE — Telephone Encounter (Signed)
Ms. Monica Oliver should stay out of work at least until our follow-up visit on 10/07/2021.  [name], MD [facility]

## 2021-09-29 ENCOUNTER — Telehealth: Payer: Self-pay | Admitting: Internal Medicine

## 2021-09-29 LAB — POCT ACTIVATED CLOTTING TIME
Activated Clotting Time: 269 seconds
Activated Clotting Time: 281 seconds
Activated Clotting Time: 239 seconds
Activated Clotting Time: 269 seconds
Activated Clotting Time: 293 seconds

## 2021-09-29 NOTE — Telephone Encounter (Signed)
Pt c/o medication issue:  1. Name of Medication: plavix and asa   2. How are you currently taking this medication (dosage and times per day)? Please advise   3. Are you having a reaction (difficulty breathing--STAT)? no  4. What is your medication issue? Patient wants to know if she should still take asa with plavix

## 2021-09-29 NOTE — Telephone Encounter (Signed)
Attempted to call pt back. No answer. LDM ok per DPR.  Notified on vm that pt is to continue dual antiplatelet therapy with aspirin 81 mg and clopidogrel for at least 12 months per hospital note 09/25/21.   Asked pt to call back with any further questions.

## 2021-09-30 ENCOUNTER — Telehealth: Payer: Self-pay | Admitting: Internal Medicine

## 2021-09-30 DIAGNOSIS — Z0279 Encounter for issue of other medical certificate: Secondary | ICD-10-CM

## 2021-09-30 NOTE — Telephone Encounter (Signed)
Placed FMLA paperwork in Dr. Darnelle Bos office.  Pt has appointment 10/07/21.

## 2021-09-30 NOTE — Telephone Encounter (Signed)
Patient dropped off FMLA forms & paid $29 cash

## 2021-10-01 NOTE — Telephone Encounter (Signed)
LVM for patient paperwork is complete

## 2021-10-04 ENCOUNTER — Other Ambulatory Visit: Payer: Self-pay | Admitting: Internal Medicine

## 2021-10-05 ENCOUNTER — Other Ambulatory Visit: Payer: Self-pay

## 2021-10-05 MED ORDER — METOPROLOL TARTRATE 25 MG PO TABS
25.0000 mg | ORAL_TABLET | Freq: Two times a day (BID) | ORAL | 0 refills | Status: DC
  Filled 2021-10-05: qty 60, 30d supply, fill #0

## 2021-10-07 ENCOUNTER — Other Ambulatory Visit: Payer: Self-pay

## 2021-10-07 ENCOUNTER — Encounter: Payer: Self-pay | Admitting: Internal Medicine

## 2021-10-07 ENCOUNTER — Ambulatory Visit: Payer: BC Managed Care – PPO | Admitting: Internal Medicine

## 2021-10-07 VITALS — BP 120/70 | HR 81 | Ht 63.0 in | Wt 177.0 lb

## 2021-10-07 DIAGNOSIS — Z720 Tobacco use: Secondary | ICD-10-CM

## 2021-10-07 DIAGNOSIS — I10 Essential (primary) hypertension: Secondary | ICD-10-CM

## 2021-10-07 DIAGNOSIS — U071 COVID-19: Secondary | ICD-10-CM | POA: Diagnosis not present

## 2021-10-07 DIAGNOSIS — E785 Hyperlipidemia, unspecified: Secondary | ICD-10-CM

## 2021-10-07 DIAGNOSIS — E1169 Type 2 diabetes mellitus with other specified complication: Secondary | ICD-10-CM | POA: Diagnosis not present

## 2021-10-07 DIAGNOSIS — I25118 Atherosclerotic heart disease of native coronary artery with other forms of angina pectoris: Secondary | ICD-10-CM

## 2021-10-07 MED ORDER — ROSUVASTATIN CALCIUM 5 MG PO TABS
5.0000 mg | ORAL_TABLET | ORAL | 3 refills | Status: DC
  Filled 2021-10-07: qty 12, 28d supply, fill #0
  Filled 2021-11-04: qty 12, 28d supply, fill #1
  Filled 2021-12-06: qty 12, 28d supply, fill #2
  Filled 2022-01-07: qty 12, 28d supply, fill #3

## 2021-10-07 MED ORDER — NITROGLYCERIN 0.4 MG SL SUBL
0.4000 mg | SUBLINGUAL_TABLET | SUBLINGUAL | 3 refills | Status: DC | PRN
  Filled 2021-10-07: qty 25, 5d supply, fill #0
  Filled 2022-07-07: qty 25, 5d supply, fill #1

## 2021-10-07 NOTE — Patient Instructions (Signed)
Medication Instructions:   Your physician has recommended you make the following change in your medication:   START Rosuvastatin 5 mg only Mondays, Wednesdays, Fridays  *If you need a refill on your cardiac medications before your next appointment, please call your pharmacy*   Lab Work:  None ordered  Testing/Procedures:  None ordered   Follow-Up: At [facility], you and your health needs are our priority.  As part of our continuing mission to provide you with exceptional heart care, we have created designated Provider Care Teams.  These Care Teams include your primary Cardiologist (physician) and Advanced Practice Providers (APPs -  Physician Assistants and Nurse Practitioners) who all work together to provide you with the care you need, when you need it.  We recommend signing up for the patient portal called "MyChart".  Sign up information is provided on this After Visit Summary.  MyChart is used to connect with patients for Virtual Visits (Telemedicine).  Patients are able to view lab/test results, encounter notes, upcoming appointments, etc.  Non-urgent messages can be sent to your provider as well.   To learn more about what you can do with MyChart, go to [email or web address].    Your next appointment:   6 week(s)  The format for your next appointment:   In Person  Provider:   You may see [name], MD or one of the following Advanced Practice Providers on your designated Care Team:   Murray Hodgkins, NP Christell Faith, PA-C Cadence Kathlen Mody, [place]

## 2021-10-07 NOTE — Progress Notes (Signed)
Follow-up Outpatient Visit Date: 10/07/2021  Primary Care Provider: Rejeana Brock, MD [address] [place] [address]  Chief Complaint: Follow-up coronary artery disease  HPI:  Ms. Monica Oliver is a 71 y.o. female with history of single-vessel coronary artery disease involving RPAV, hypertension, hyperlipidemia, type 2 diabetes mellitus, iron deficiency anemia, COPD, obstructive sleep apnea, shingles, and tobacco abuse, who presents for follow-up of coronary artery disease with unstable angina.  I last saw her 2 weeks ago in the office, at which time she complained of increased chest pain over the last 2 to 3 weeks.  She was referred to the ED due to concern for unstable angina in the setting of her severe RPAV stenosis as well as elevated heart rates.  She was diagnosed with COVID-19.  She underwent catheterization during her admission that showed similar appearance of severe RPAV stenosis.  Attempted PCI was unsuccessful due to inability to cross the lesion with 1.5 mm balloon.  Intervention was complicated by iatrogenic dissection of the distal RCA from guide extension necessitating stenting of the distal RCA.  Aggressive antianginal therapy was recommended.  Today, Ms. Monica Oliver reports that she is feeling much better.  This is actually the best she is felt in 6 months.  She has not had any further pressure-like chest pain.  She continues to have occasional transient sharp left upper chest pains.  This typically lasts only for a second and is not associated with typical activity.  She denies shortness of breath.  Her cough is improving.  She has not had any palpitations, lightheadedness, or edema.  Left radial catheterization site has healed well.  --------------------------------------------------------------------------------------------------  Cardiovascular History & Procedures: Cardiovascular Problems: Coronary artery disease   Risk Factors: Hypertension, hyperlipidemia, diabetes  mellitus, obesity, tobacco use, and age greater than 44; coronary artery and aortic atherosclerotic calcification noted on noncontrast CT of the chest (07/16/2018 - personally reviewed)   Cath/PCI: LHC/PCI (09/24/2021): Severe single-vessel CAD with 90% stenosis of proximal RPA V, similar to prior catheterization in 12/2020.  Vigorous LV contraction with LVEDP 25-30 mmHg.  Unsuccessful PCI to RPA V due to resistance of the lesion and inability to cross.  Intervention complicated by guide extension dissection of distal RCA necessitating stenting with Onyx Frontier 2.5 x 12 mm drug-eluting stent. LHC (12/29/2020): Severe single-vessel coronary artery disease with 80-90% stenosis of rPAV.  Mild, nonobstructive disease involving mid RCA and ostium of D2.  Normal LVEF with mildly elevated LVEDP.   CV Surgery: None   EP Procedures and Devices: None   Non-Invasive Evaluation(s): TTE (11/06/2018): Normal LV size with moderate LVH.  LVEF 55-60% with grade 1 diastolic dysfunction.  Mild mitral annular calcification.  Normal RV size and function.  Unable to assess RVSP. Cardiac CTA (09/10/18): CAC score 306 (91st percentile).  LMCA normal.  LAD normal.  LCx relatively small with <50% stenosis in mid vessel.  Small ramus normal.  RCA with <50% stenosis in mid vessel.  51-69% stenosis in distal RCA/rPL (CTFFR of rPL 0.52).  Right lower extremity venous duplex (04/22/2011): No evidence of DVT in the right lower extremity.  Recent CV Pertinent Labs: Lab Results  Component Value Date   CHOL 161 12/16/2020   HDL 55 12/16/2020   LDLCALC 63 12/16/2020   TRIG 213 (H) 12/16/2020   CHOLHDL 2.9 12/16/2020   INR 1.0 09/23/2021   BNP 18.0 09/23/2021   K 3.5 09/25/2021   K 3.6 03/15/2014   BUN 17 09/25/2021   BUN 11 03/15/2014  CREATININE 0.80 09/25/2021   CREATININE 1.01 03/15/2014    Past medical and surgical history were reviewed and updated in EPIC.  Current Meds  Medication Sig   albuterol (PROVENTIL)  (2.5 MG/3ML) 0.083% nebulizer solution TAKE 3 MLS (2.5 MG TOTAL) BY NEBULIZATION EVERY 6 HOURS AS NEEDED FOR WHEEZING OR SHORTNESS OF BREATH.   albuterol (VENTOLIN HFA) 108 (90 Base) MCG/ACT inhaler INHALE 2 PUFFS BY MOUTH EVERY 4 HOURS AS NEEDED   amLODipine (NORVASC) 5 MG tablet Take 1 tablet (5 mg total) by mouth daily.   aspirin 81 MG tablet Take 81 mg by mouth daily.   budesonide-formoterol (SYMBICORT) 160-4.5 MCG/ACT inhaler INHALE 2 PUFFS BY MOUTH TWO TIMES DAILY   cetirizine (ZYRTEC) 10 MG tablet Take 10 mg by mouth daily as needed for allergies.   cholecalciferol (VITAMIN D3) 25 MCG (1000 UNIT) tablet Take 1,000 Units by mouth daily.   clopidogrel (PLAVIX) 75 MG tablet Take 1 tablet (75 mg total) by mouth daily with breakfast.   ezetimibe (ZETIA) 10 MG tablet Take 1 tablet (10 mg total) by mouth daily. PLEASE CALL TO SCHEDULE OFFICE VISIT FOR FURTHER REFILLS. THANK YOU!   ferrous sulfate 325 (65 FE) MG tablet TAKE 1 TABLET BY MOUTH EVERY OTHER DAY FOR ANEMIA (SPANISH)   fluticasone (FLONASE) 50 MCG/ACT nasal spray Place 1 spray into both nostrils daily as needed for allergies or rhinitis.   gabapentin (NEURONTIN) 300 MG capsule Take 300 mg by mouth 2 (two) times daily.   glucose blood (FREESTYLE LITE) test strip Test once a day as directed   hydrocortisone cream 1 % Apply 1 application topically 2 (two) times daily as needed for itching.   lisinopril-hydrochlorothiazide (ZESTORETIC) 20-12.5 MG tablet TAKE 1 TABLET BY MOUTH DAILY.   metFORMIN (GLUCOPHAGE-XR) 500 MG 24 hr tablet Take 2 tablets by mouth twice a day for diabetes   metoprolol tartrate (LOPRESSOR) 25 MG tablet Take 1 tablet (25 mg total) by mouth 2 (two) times daily.   nitroGLYCERIN (NITROSTAT) 0.4 MG SL tablet Place 1 tablet (0.4 mg total) under the tongue every 5 (five) minutes as needed for chest pain. Maximum of 3 doses.   omeprazole (PRILOSEC) 20 MG capsule TAKE 1 CAPSULE BY MOUTH ONCE A DAY AS NEEDED FOR HEARTBURN    Tiotropium Bromide Monohydrate (SPIRIVA RESPIMAT) 1.25 MCG/ACT AERS INHALE 2 PUFFS INTO THE LUNGS DAILY.   TRUE METRIX BLOOD GLUCOSE TEST test strip    zinc gluconate 50 MG tablet Take 50 mg by mouth daily.    Allergies: Penicillins and Statins  Social History   Tobacco Use   Smoking status: Former    Packs/day: 0.75    Years: 40.00    Pack years: 30.00    Types: Cigarettes    Start date: 12/15/1971   Smokeless tobacco: Never   Tobacco comments:    1 cig daily--01/26/2021  Vaping Use   Vaping Use: Never used  Substance Use Topics   Alcohol use: No    Alcohol/week: 0.0 standard drinks   Drug use: No    Family History  Problem Relation Age of Onset   Hypertension Mother    Cancer Mother        lung   Cancer Father        prostate   Hyperlipidemia Father    Heart attack Father 42   Cancer Brother        prostate   Cirrhosis Sister    Hepatitis Sister    Breast cancer Paternal Aunt  Review of Systems: A 12-system review of systems was performed and was negative except as noted in the HPI.  --------------------------------------------------------------------------------------------------  Physical Exam: BP 120/70 (BP Location: Left Arm, Patient Position: Sitting, Cuff Size: Large)    Pulse 81    Ht 5\' 3"  (1.6 m)    Wt 177 lb (80.3 kg)    SpO2 98%    BMI 31.35 kg/m   General:  NAD. Neck: No JVD or HJR. Lungs: Clear to auscultation bilaterally without wheezes or crackles. Heart: Regular rate and rhythm without murmurs, rubs, or gallops. Abdomen: Soft, nontender, nondistended. Extremities: No lower extremity edema.  Left radial arteriotomy site well-healed.  2+ left radial pulse.  1+ right radial pulse.  EKG: Normal sinus rhythm with left axis deviation.  Axis has shifted to the left.  Otherwise, no significant change since 09/25/2021.  Lab Results  Component Value Date   WBC 6.0 09/25/2021   HGB 11.4 (L) 09/25/2021   HCT 35.3 (L) 09/25/2021   MCV 82.1  09/25/2021   PLT 325 09/25/2021    Lab Results  Component Value Date   NA 136 09/25/2021   K 3.5 09/25/2021   CL 98 09/25/2021   CO2 29 09/25/2021   BUN 17 09/25/2021   CREATININE 0.80 09/25/2021   GLUCOSE 113 (H) 09/25/2021   ALT 19 09/25/2021    Lab Results  Component Value Date   CHOL 161 12/16/2020   HDL 55 12/16/2020   LDLCALC 63 12/16/2020   TRIG 213 (H) 12/16/2020   CHOLHDL 2.9 12/16/2020    --------------------------------------------------------------------------------------------------  ASSESSMENT AND PLAN: Coronary artery disease with stable angina: Chest pain has improved since recent hospitalization.  Unfortunately, attempted PCI to RPA V was unsuccessful at that time and required stent placement to iatrogenic dissection of the distal RCA.  We will continue with medical therapy indefinitely, if possible.  If Ms. Buckalew were to have refractory angina in the future, we would need to consider repeat attempt at PCI at [facility] versus referral for single-vessel CABG.  Neither option is ideal.  In an effort to optimize her secondary prevention therapy, we will rechallenge her with a statin (rosuvastatin 5 mg on Mondays, Wednesdays, and Fridays) while continuing ezetimibe.  Continue current doses of metoprolol and amlodipine for antianginal therapy (previously intolerant of long-acting nitrates).  We will also need to continue 12 months of DAPT in the setting of DES to distal RCA dissection.  Hyperlipidemia associated with type 2 diabetes mellitus: Though LDL well controlled on last check, triglycerides were mildly elevated.  In the setting of severe single-vessel coronary artery disease.  We have agreed to rechallenge Ms. Page Spiro with a statin; she is previously been intolerant to atorvastatin.  We will begin rosuvastatin 5 mg on Mondays, Wednesdays, and Fridays, while continuing ezetimibe.  Follow-up lipid panel and ALT will need to be drawn in about 2 to 3  months.  Hypertension: Blood pressure well controlled today.  Continue current medications.  COVID-19: Symptoms have largely resolved.  Tobacco abuse: Ms. Page Spiro has stopped smoking since her recent hospitalization.  I have congratulated her on this and have encouraged her to remain abstinent.  Follow-up: Return to clinic in 6 weeks.  [name], MD 10/07/2021 12:02 PM

## 2021-10-07 NOTE — Telephone Encounter (Signed)
Pt in office today for follow up and confirms that she has received her FMLA paperwork.

## 2021-10-12 ENCOUNTER — Telehealth: Payer: Self-pay | Admitting: Internal Medicine

## 2021-10-12 NOTE — Telephone Encounter (Signed)
Patient came by office States that she needs help answering some questions on a form for work that is asking about her medical info Would like to know if we can go over it with her  Made a copy of the questions and placed in nurse box Please call - best time is after 4p

## 2021-10-12 NOTE — Telephone Encounter (Signed)
Spoke with pt. Received paperwork from pt.  Forms are a health questionnaire from pt's employer.  Requires medical provider signature.  Notified pt I will have signed for her ready to pick up tomorrow in the office.  Pt voiced understanding and has no further questions at this time.

## 2021-10-13 DIAGNOSIS — M47817 Spondylosis without myelopathy or radiculopathy, lumbosacral region: Secondary | ICD-10-CM | POA: Diagnosis not present

## 2021-10-14 ENCOUNTER — Ambulatory Visit: Payer: BC Managed Care – PPO | Admitting: Internal Medicine

## 2021-10-17 ENCOUNTER — Other Ambulatory Visit: Payer: Self-pay | Admitting: Internal Medicine

## 2021-10-18 ENCOUNTER — Other Ambulatory Visit: Payer: Self-pay

## 2021-10-18 MED ORDER — EZETIMIBE 10 MG PO TABS
10.0000 mg | ORAL_TABLET | Freq: Every day | ORAL | 0 refills | Status: DC
  Filled 2021-10-18: qty 30, 30d supply, fill #0

## 2021-11-04 ENCOUNTER — Other Ambulatory Visit: Payer: Self-pay | Admitting: Internal Medicine

## 2021-11-04 ENCOUNTER — Other Ambulatory Visit: Payer: Self-pay

## 2021-11-04 MED ORDER — EZETIMIBE 10 MG PO TABS
10.0000 mg | ORAL_TABLET | Freq: Every day | ORAL | 2 refills | Status: DC
  Filled 2021-11-04 – 2021-11-18 (×2): qty 30, 30d supply, fill #0

## 2021-11-04 MED FILL — Metoprolol Tartrate Tab 25 MG: ORAL | 30 days supply | Qty: 60 | Fill #0 | Status: AC

## 2021-11-10 ENCOUNTER — Other Ambulatory Visit: Payer: Self-pay

## 2021-11-12 DIAGNOSIS — G4733 Obstructive sleep apnea (adult) (pediatric): Secondary | ICD-10-CM | POA: Diagnosis not present

## 2021-11-15 ENCOUNTER — Other Ambulatory Visit: Payer: Self-pay

## 2021-11-15 ENCOUNTER — Encounter: Payer: Medicare Other | Attending: Internal Medicine | Admitting: *Deleted

## 2021-11-15 DIAGNOSIS — Z955 Presence of coronary angioplasty implant and graft: Secondary | ICD-10-CM

## 2021-11-15 NOTE — Progress Notes (Signed)
Initial telephone completed. Diagnosis can be found in [facility] 12/29. EP orientation scheduled for Thursday 3/9 at 3pm.

## 2021-11-18 ENCOUNTER — Other Ambulatory Visit: Payer: Self-pay

## 2021-11-18 ENCOUNTER — Other Ambulatory Visit: Payer: Self-pay | Admitting: Internal Medicine

## 2021-11-18 NOTE — Telephone Encounter (Signed)
Yes please refill Plavix. It looks like it was discontinued by PCP (reason states discontinued during med reconciliation).   Per last note with Dr. Saunders Revel 10/07/21: We will also need to continue 12 months of DAPT in the setting of DES to distal RCA dissection.

## 2021-11-18 NOTE — Telephone Encounter (Signed)
Can you please advise refill for Plavix -- it is not on patients medication list.

## 2021-11-19 ENCOUNTER — Other Ambulatory Visit: Payer: Self-pay

## 2021-11-19 ENCOUNTER — Telehealth: Payer: Self-pay | Admitting: Internal Medicine

## 2021-11-19 MED ORDER — METFORMIN HCL ER 500 MG PO TB24
ORAL_TABLET | ORAL | 10 refills | Status: DC
  Filled 2021-11-19: qty 120, 30d supply, fill #0

## 2021-11-19 MED FILL — Amlodipine Besylate Tab 5 MG (Base Equivalent): ORAL | 30 days supply | Qty: 30 | Fill #0 | Status: AC

## 2021-11-19 MED FILL — Clopidogrel Bisulfate Tab 75 MG (Base Equiv): ORAL | 30 days supply | Qty: 30 | Fill #0 | Status: AC

## 2021-11-19 NOTE — Telephone Encounter (Signed)
Patient states that her pharmacist states her Lisinopril-hydrochlorothiazide prescription was D/C. Please call to discuss.

## 2021-11-22 ENCOUNTER — Other Ambulatory Visit: Payer: Self-pay

## 2021-11-23 ENCOUNTER — Other Ambulatory Visit: Payer: Self-pay

## 2021-11-23 MED ORDER — LISINOPRIL-HYDROCHLOROTHIAZIDE 20-12.5 MG PO TABS
1.0000 | ORAL_TABLET | Freq: Every day | ORAL | 0 refills | Status: DC
  Filled 2021-11-23: qty 90, 90d supply, fill #0

## 2021-11-23 NOTE — Telephone Encounter (Signed)
Pt returned call. States she went to pick up refill of Lisinopril-HCTZ 20-12.5 mg QD and was informed by pharmacy that it had been discontinued on 09/23/22 by a CMA in our office.   Reviewed chart. Pt was seen in office 09/23/22 then sent to ED (See encounter 12/29).  Lisinopril-HCTZ appeared discontinued d/t "completed course" but remained active on pt's chart.  Medication was not discontinued at office visit or while in hospital per chart review.   Pt also followed up with Dr. Saunders Revel 10/07/21 with Lisinopril-HCTZ 20-12.5 mg QD active in chart at visit, no med changes made.   [facility] pharmacy and confirmed above.  Refill sent to [place].  Pt notified by voicemail, pt states ok to lv vm on return call.   Asked pt to call back with any further questions/concerns.

## 2021-11-23 NOTE — Telephone Encounter (Signed)
Attempted to call pt. No answer. Lmtcb.   Reviewed pt's chart. I do not see where her Lisinopril-HCTZ was discontinued on our end.  Will discuss further when pt returns call.

## 2021-11-24 ENCOUNTER — Other Ambulatory Visit: Payer: Self-pay

## 2021-11-25 ENCOUNTER — Other Ambulatory Visit: Payer: Self-pay

## 2021-12-02 ENCOUNTER — Encounter: Payer: Self-pay | Admitting: Internal Medicine

## 2021-12-02 ENCOUNTER — Ambulatory Visit: Payer: BC Managed Care – PPO | Admitting: Internal Medicine

## 2021-12-02 ENCOUNTER — Other Ambulatory Visit: Payer: Self-pay

## 2021-12-02 ENCOUNTER — Telehealth: Payer: Self-pay | Admitting: Internal Medicine

## 2021-12-02 ENCOUNTER — Encounter: Payer: BC Managed Care – PPO | Attending: Internal Medicine

## 2021-12-02 VITALS — BP 110/70 | HR 84 | Ht 63.0 in | Wt 179.0 lb

## 2021-12-02 VITALS — Ht 63.0 in | Wt 180.4 lb

## 2021-12-02 DIAGNOSIS — E1169 Type 2 diabetes mellitus with other specified complication: Secondary | ICD-10-CM

## 2021-12-02 DIAGNOSIS — Z79899 Other long term (current) drug therapy: Secondary | ICD-10-CM | POA: Insufficient documentation

## 2021-12-02 DIAGNOSIS — I25118 Atherosclerotic heart disease of native coronary artery with other forms of angina pectoris: Secondary | ICD-10-CM

## 2021-12-02 DIAGNOSIS — I10 Essential (primary) hypertension: Secondary | ICD-10-CM | POA: Diagnosis not present

## 2021-12-02 DIAGNOSIS — E785 Hyperlipidemia, unspecified: Secondary | ICD-10-CM | POA: Diagnosis not present

## 2021-12-02 DIAGNOSIS — Z955 Presence of coronary angioplasty implant and graft: Secondary | ICD-10-CM | POA: Diagnosis not present

## 2021-12-02 MED ORDER — EZETIMIBE 10 MG PO TABS
10.0000 mg | ORAL_TABLET | Freq: Every day | ORAL | 2 refills | Status: DC
  Filled 2021-12-02 – 2021-12-21 (×3): qty 30, 30d supply, fill #0

## 2021-12-02 MED ORDER — LISINOPRIL 10 MG PO TABS
10.0000 mg | ORAL_TABLET | Freq: Every day | ORAL | 1 refills | Status: DC
  Filled 2021-12-02: qty 30, 30d supply, fill #0
  Filled 2021-12-21 – 2021-12-27 (×2): qty 30, 30d supply, fill #1

## 2021-12-02 MED ORDER — METOPROLOL TARTRATE 50 MG PO TABS
50.0000 mg | ORAL_TABLET | Freq: Two times a day (BID) | ORAL | 1 refills | Status: DC
  Filled 2021-12-02: qty 60, 30d supply, fill #0
  Filled 2021-12-21 – 2021-12-27 (×2): qty 60, 30d supply, fill #1

## 2021-12-02 MED ORDER — FUROSEMIDE 20 MG PO TABS
20.0000 mg | ORAL_TABLET | Freq: Every day | ORAL | 2 refills | Status: DC | PRN
  Filled 2021-12-02: qty 30, 30d supply, fill #0

## 2021-12-02 MED ORDER — CLOPIDOGREL BISULFATE 75 MG PO TABS
ORAL_TABLET | ORAL | 2 refills | Status: DC
  Filled 2021-12-02 – 2021-12-21 (×3): qty 30, 30d supply, fill #0

## 2021-12-02 NOTE — Progress Notes (Signed)
Follow-up Outpatient Visit Date: 12/02/2021  Primary Care Provider: Rejeana Brock, MD [address] [place] [address]  Chief Complaint: Follow-up coronary artery disease  HPI:  Monica Oliver is a 71 y.o. female with history of single-vessel coronary artery disease involving RPAV status post unsuccessful PCI due to resistant lesion necessitating drug-eluting stent placement to the distal RCA for management of iatrogenic dissection, hypertension, hyperlipidemia, type 2 diabetes mellitus, iron deficiency anemia, COPD, obstructive sleep apnea, shingles, and tobacco abuse, who presents for follow-up of coronary artery disease.  I last saw her about 2 months ago, at which time she was feeling well with less chest pain.  We agreed to challenge her with rosuvastatin 5 mg on Mondays, Wednesdays, and Fridays given history of myalgias with atorvastatin.  Today, Monica Oliver reports that she has been feeling fairly well though she has experienced some mild tightness in the left upper chest when she "rushes."  Overall, she still feels better compared to before her hospitalization and attempted PCI in late December.  She noted mild chest tightness in the left upper chest when walking into the office today that resolved but then recurred briefly while we were talking.  I had her take a single sublingual nitroglycerin, which promptly relieved the pain.  She denies shortness of breath, palpitations, and lightheadedness.  She has not had any frank edema but feels a little bloated and puffy all over.  She attributes this to eating more, now that she feels better.  She is taking her medications as prescribed.  She is tolerating recent addition of rosuvastatin well.  --------------------------------------------------------------------------------------------------  Cardiovascular History & Procedures: Cardiovascular Problems: Coronary artery disease   Risk Factors: Hypertension, hyperlipidemia, diabetes mellitus,  obesity, tobacco use, and age greater than 69; coronary artery and aortic atherosclerotic calcification noted on noncontrast CT of the chest (07/16/2018 - personally reviewed)   Cath/PCI: LHC/PCI (09/24/2021): Severe single-vessel CAD with 90% stenosis of proximal RPA V, similar to prior catheterization in 12/2020.  Vigorous LV contraction with LVEDP 25-30 mmHg.  Unsuccessful PCI to RPA V due to resistance of the lesion and inability to cross.  Intervention complicated by guide extension dissection of distal RCA necessitating stenting with Onyx Frontier 2.5 x 12 mm drug-eluting stent. LHC (12/29/2020): Severe single-vessel coronary artery disease with 80-90% stenosis of rPAV.  Mild, nonobstructive disease involving mid RCA and ostium of D2.  Normal LVEF with mildly elevated LVEDP.   CV Surgery: None   EP Procedures and Devices: None   Non-Invasive Evaluation(s): TTE (11/06/2018): Normal LV size with moderate LVH.  LVEF 55-60% with grade 1 diastolic dysfunction.  Mild mitral annular calcification.  Normal RV size and function.  Unable to assess RVSP. Cardiac CTA (09/10/18): CAC score 306 (91st percentile).  LMCA normal.  LAD normal.  LCx relatively small with <50% stenosis in mid vessel.  Small ramus normal.  RCA with <50% stenosis in mid vessel.  51-69% stenosis in distal RCA/rPL (CTFFR of rPL 0.52).  Right lower extremity venous duplex (04/22/2011): No evidence of DVT in the right lower extremity.  Recent CV Pertinent Labs: Lab Results  Component Value Date   CHOL 161 12/16/2020   HDL 55 12/16/2020   LDLCALC 63 12/16/2020   TRIG 213 (H) 12/16/2020   CHOLHDL 2.9 12/16/2020   INR 1.0 09/23/2021   BNP 18.0 09/23/2021   K 3.5 09/25/2021   K 3.6 03/15/2014   BUN 17 09/25/2021   BUN 11 03/15/2014   CREATININE 0.80 09/25/2021   CREATININE  1.01 03/15/2014    Past medical and surgical history were reviewed and updated in EPIC.  Current Meds  Medication Sig   albuterol (PROVENTIL) (2.5  MG/3ML) 0.083% nebulizer solution TAKE 3 MLS (2.5 MG TOTAL) BY NEBULIZATION EVERY 6 HOURS AS NEEDED FOR WHEEZING OR SHORTNESS OF BREATH.   albuterol (VENTOLIN HFA) 108 (90 Base) MCG/ACT inhaler INHALE 2 PUFFS BY MOUTH EVERY 4 HOURS AS NEEDED   amLODipine (NORVASC) 5 MG tablet Take 1 tablet (5 mg total) by mouth daily.   aspirin 81 MG tablet Take 81 mg by mouth daily.   budesonide-formoterol (SYMBICORT) 160-4.5 MCG/ACT inhaler INHALE 2 PUFFS BY MOUTH TWO TIMES DAILY   cetirizine (ZYRTEC) 10 MG tablet Take 10 mg by mouth daily as needed for allergies.   cholecalciferol (VITAMIN D3) 25 MCG (1000 UNIT) tablet Take 1,000 Units by mouth daily.   clopidogrel (PLAVIX) 75 MG tablet Take 1 tablet (75 mg total) by mouth daily with breakfast.   ezetimibe (ZETIA) 10 MG tablet Take 1 tablet (10 mg total) by mouth daily. PLEASE CALL TO SCHEDULE OFFICE VISIT FOR FURTHER REFILLS. THANK YOU!   ferrous sulfate 325 (65 FE) MG tablet TAKE 1 TABLET BY MOUTH EVERY OTHER DAY FOR ANEMIA (SPANISH)   fluticasone (FLONASE) 50 MCG/ACT nasal spray Place 1 spray into both nostrils daily as needed for allergies or rhinitis.   gabapentin (NEURONTIN) 300 MG capsule Take 300 mg by mouth 2 (two) times daily.   glucose blood (FREESTYLE LITE) test strip Test once a day as directed   hydrocortisone cream 1 % Apply 1 application topically 2 (two) times daily as needed for itching.   lisinopril-hydrochlorothiazide (ZESTORETIC) 20-12.5 MG tablet Take 1 tablet by mouth daily.   metFORMIN (GLUCOPHAGE-XR) 500 MG 24 hr tablet Take 2 tablets by mouth twice a day for diabetes   metoprolol tartrate (LOPRESSOR) 25 MG tablet Take 1 tablet (25 mg total) by mouth 2 (two) times daily.   nitroGLYCERIN (NITROSTAT) 0.4 MG SL tablet Place 1 tablet (0.4 mg total) under the tongue every 5 (five) minutes as needed for chest pain. Maximum of 3 doses.   omeprazole (PRILOSEC) 20 MG capsule TAKE 1 CAPSULE BY MOUTH ONCE A DAY AS NEEDED FOR HEARTBURN    rosuvastatin (CRESTOR) 5 MG tablet Take 1 tablet (5 mg total) by mouth every Monday, Wednesday, and Friday.   Tiotropium Bromide Monohydrate (SPIRIVA RESPIMAT) 1.25 MCG/ACT AERS INHALE 2 PUFFS INTO THE LUNGS DAILY.   TRUE METRIX BLOOD GLUCOSE TEST test strip    zinc gluconate 50 MG tablet Take 50 mg by mouth daily.    Allergies: Penicillins and Statins  Social History   Tobacco Use   Smoking status: Former    Packs/day: 0.75    Years: 40.00    Pack years: 30.00    Types: Cigarettes    Start date: 12/15/1971   Smokeless tobacco: Never   Tobacco comments:    1 cig daily--01/26/2021  Vaping Use   Vaping Use: Never used  Substance Use Topics   Alcohol use: No    Alcohol/week: 0.0 standard drinks   Drug use: No    Family History  Problem Relation Age of Onset   Hypertension Mother    Cancer Mother        lung   Cancer Father        prostate   Hyperlipidemia Father    Heart attack Father 41   Cancer Brother        prostate   Cirrhosis  Sister    Hepatitis Sister    Breast cancer Paternal Aunt     Review of Systems: Monica Oliver reports some pain and weakness in the right shoulder over the last week that began after she slept awkwardly on her right side.  Otherwise, a 12-system review of systems was performed and was negative except as noted in the HPI.  --------------------------------------------------------------------------------------------------  Physical Exam: BP 110/70 (BP Location: Left Arm, Patient Position: Sitting, Cuff Size: Large)    Pulse 84    Ht '5\' 3"'$  (1.6 m)    Wt 179 lb (81.2 kg)    SpO2 98%    BMI 31.71 kg/m   General:  NAD. Neck: No JVD or HJR. Lungs: Clear to auscultation bilaterally without wheezes or crackles. Heart: Regular rate and rhythm without murmurs, rubs, or gallops. Abdomen: Soft, nontender, nondistended. Extremities: No lower extremity edema.  EKG: Normal sinus rhythm with left axis deviation and low voltage.  No significant change  from prior tracing on 10/07/2021.  Lab Results  Component Value Date   WBC 6.0 09/25/2021   HGB 11.4 (L) 09/25/2021   HCT 35.3 (L) 09/25/2021   MCV 82.1 09/25/2021   PLT 325 09/25/2021    Lab Results  Component Value Date   NA 136 09/25/2021   K 3.5 09/25/2021   CL 98 09/25/2021   CO2 29 09/25/2021   BUN 17 09/25/2021   CREATININE 0.80 09/25/2021   GLUCOSE 113 (H) 09/25/2021   ALT 19 09/25/2021    Lab Results  Component Value Date   CHOL 161 12/16/2020   HDL 55 12/16/2020   LDLCALC 63 12/16/2020   TRIG 213 (H) 12/16/2020   CHOLHDL 2.9 12/16/2020    --------------------------------------------------------------------------------------------------  ASSESSMENT AND PLAN: Coronary artery disease with stable angina: Overall, Monica Oliver feels better than leading up to her attempted PCI in late December.  She still has some transient chest discomfort with strenuous activities, which resolves with rest.  She briefly had chest pain during her visit today that resolved with a single sublingual nitroglycerin.  EKG does not show any ischemic changes and is stable compared to the prior tracing from mid January.  We discussed the resistant nature of her RPAV stenosis and treatment options including escalation of medical therapy, attempted repeat PCI, and single-vessel CABG.  We both agree that optimizing medical therapy would be the best course of action unless she has refractory, lifestyle limiting symptoms.  We will therefore increase metoprolol to tartrate to 50 mg daily and continue amlodipine 5 mg daily.  Monica Oliver should complete at least 6, ideally 12 months of DAPT with aspirin and clopidogrel.  We will also recheck her lipids today with plans to escalate rosuvastatin, as needed, up to achieve an LDL less than 70.  I have advised her to seek immediate medical attention if she were to have worsening chest pain, particularly if it does not resolve promptly with rest or sublingual  nitroglycerin.  Hyperlipidemia associated with type 2 diabetes mellitus: Monica Oliver is tolerating rosuvastatin 5 mg on Mondays, Wednesdays, and Fridays well.  We will plan to continue this and ezetimibe 10 mg daily pending repeat lipid panel and CMP today.  If LDL is above 70, we will plan to escalate rosuvastatin carefully, given her history of myalgias with atorvastatin.  Hypertension: Blood pressure is well controlled today.  Monica Oliver is concerned about the potential for hypotension with escalation of metoprolol.  We have therefore agreed to discontinue lisinopril-HCTZ and instead place her  on lisinopril 10 mg daily alone.  I have also given her prescription for furosemide 20 mg daily as needed for edema/weight gain.  We will continue with amlodipine 5 mg daily and escalate metoprolol to 50 mg twice daily.  Follow-up: Return to clinic in 1 month.  [name], MD 12/02/2021 11:17 AM

## 2021-12-02 NOTE — Telephone Encounter (Signed)
Patient needs 1 m fu per checkout 12-02-21 .  Patient works night and declined to stop at [employer] . Will in the morning.  ?

## 2021-12-02 NOTE — Patient Instructions (Signed)
Patient Instructions  Patient Details  Name: Monica Oliver MRN: [id number] Date of Birth: Apr 02, 1951 Referring Provider:  [name], MD  Below are your personal goals for exercise, nutrition, and risk factors. Our goal is to help you stay on track towards obtaining and maintaining these goals. We will be discussing your progress on these goals with you throughout the program.  Initial Exercise Prescription:  Initial Exercise Prescription - 12/02/21 1600       Date of Initial Exercise RX and Referring Provider   Date 12/02/21    Referring Provider End      Oxygen   Maintain Oxygen Saturation 88% or higher      Treadmill   MPH 1.5    Grade 0.5    Minutes 15    METs 2.25      Recumbant Bike   Level 1    RPM 60    Minutes 15    METs 2.3      NuStep   Level 2    SPM 80    Minutes 15    METs 2.3      Track   Laps 20    Minutes 15    METs 2.1      Prescription Details   Frequency (times per week) 3    Duration Progress to 30 minutes of continuous aerobic without signs/symptoms of physical distress      Intensity   THRR 40-80% of Max Heartrate 112-138    Ratings of Perceived Exertion 11-13    Perceived Dyspnea 0-4      Resistance Training   Training Prescription Yes    Weight 3 lb    Reps 10-15             Exercise Goals: Frequency: Be able to perform aerobic exercise two to three times per week in program working toward 2-5 days per week of home exercise.  Intensity: Work with a perceived exertion of 11 (fairly light) - 15 (hard) while following your exercise prescription.  We will make changes to your prescription with you as you progress through the program.   Duration: Be able to do 30 to 45 minutes of continuous aerobic exercise in addition to a 5 minute warm-up and a 5 minute cool-down routine.   Nutrition Goals: Your personal nutrition goals will be established when you do your nutrition analysis with the dietician.  The following are  general nutrition guidelines to follow: Cholesterol < '200mg'$ /day Sodium < '1500mg'$ /day Fiber: Women under 50 yrs - 25 grams per day  Personal Goals:  Personal Goals and Risk Factors at Admission - 12/02/21 1655       Core Components/Risk Factors/Patient Goals on Admission    Weight Management Yes    Intervention Weight Management/Obesity: Establish reasonable short term and long term weight goals.    Admit Weight 180 lb 6.4 oz (81.8 kg)    Goal Weight: Short Term 175 lb (79.4 kg)    Goal Weight: Long Term 170 lb (77.1 kg)    Expected Outcomes Short Term: Continue to assess and modify interventions until short term weight is achieved;Long Term: Adherence to nutrition and physical activity/exercise program aimed toward attainment of established weight goal;Weight Loss: Understanding of general recommendations for a balanced deficit meal plan, which promotes 1-2 lb weight loss per week and includes a negative energy balance of [phone] kcal/d;Understanding recommendations for meals to include 15-35% energy as protein, 25-35% energy from fat, 35-60% energy from carbohydrates, less than '200mg'$  of dietary  cholesterol, 20-35 gm of total fiber daily;Understanding of distribution of calorie intake throughout the day with the consumption of 4-5 meals/snacks    Diabetes Yes    Intervention Provide education about signs/symptoms and action to take for hypo/hyperglycemia.;Provide education about proper nutrition, including hydration, and aerobic/resistive exercise prescription along with prescribed medications to achieve blood glucose in normal ranges: Fasting glucose 65-99 mg/dL    Expected Outcomes Short Term: Participant verbalizes understanding of the signs/symptoms and immediate care of hyper/hypoglycemia, proper foot care and importance of medication, aerobic/resistive exercise and nutrition plan for blood glucose control.;Long Term: Attainment of HbA1C < 7%.    Hypertension Yes    Intervention Provide  education on lifestyle modifcations including regular physical activity/exercise, weight management, moderate sodium restriction and increased consumption of fresh fruit, vegetables, and low fat dairy, alcohol moderation, and smoking cessation.;Monitor prescription use compliance.    Lipids Yes    Intervention Provide education and support for participant on nutrition & aerobic/resistive exercise along with prescribed medications to achieve LDL '70mg'$ , HDL >'40mg'$ .    Expected Outcomes Short Term: Participant states understanding of desired cholesterol values and is compliant with medications prescribed. Participant is following exercise prescription and nutrition guidelines.;Long Term: Cholesterol controlled with medications as prescribed, with individualized exercise RX and with personalized nutrition plan. Value goals: LDL < '70mg'$ , HDL > 40 mg.             Tobacco Use Initial Evaluation: Social History   Tobacco Use  Smoking Status Former   Packs/day: 0.75   Years: 40.00   Pack years: 30.00   Types: Cigarettes   Start date: 12/15/1971  Smokeless Tobacco Never  Tobacco Comments   1 cig daily--01/26/2021    Exercise Goals and Review:  Exercise Goals     Row Name 12/02/21 1654             Exercise Goals   Increase Physical Activity Yes       Intervention Provide advice, education, support and counseling about physical activity/exercise needs.;Develop an individualized exercise prescription for aerobic and resistive training based on initial evaluation findings, risk stratification, comorbidities and participant's personal goals.       Expected Outcomes Short Term: Attend rehab on a regular basis to increase amount of physical activity.;Long Term: Add in home exercise to make exercise part of routine and to increase amount of physical activity.;Long Term: Exercising regularly at least 3-5 days a week.       Increase Strength and Stamina Yes       Intervention Provide advice,  education, support and counseling about physical activity/exercise needs.;Develop an individualized exercise prescription for aerobic and resistive training based on initial evaluation findings, risk stratification, comorbidities and participant's personal goals.       Expected Outcomes Short Term: Increase workloads from initial exercise prescription for resistance, speed, and METs.;Short Term: Perform resistance training exercises routinely during rehab and add in resistance training at home;Long Term: Improve cardiorespiratory fitness, muscular endurance and strength as measured by increased METs and functional capacity (6MWT)       Able to understand and use rate of perceived exertion (RPE) scale Yes       Intervention Provide education and explanation on how to use RPE scale       Expected Outcomes Short Term: Able to use RPE daily in rehab to express subjective intensity level;Long Term:  Able to use RPE to guide intensity level when exercising independently       Able to understand and use  Dyspnea scale Yes       Intervention Provide education and explanation on how to use Dyspnea scale       Expected Outcomes Short Term: Able to use Dyspnea scale daily in rehab to express subjective sense of shortness of breath during exertion;Long Term: Able to use Dyspnea scale to guide intensity level when exercising independently       Knowledge and understanding of Target Heart Rate Range (THRR) Yes       Intervention Provide education and explanation of THRR including how the numbers were predicted and where they are located for reference       Expected Outcomes Short Term: Able to state/look up THRR;Long Term: Able to use THRR to govern intensity when exercising independently       Able to check pulse independently Yes       Intervention Provide education and demonstration on how to check pulse in carotid and radial arteries.;Review the importance of being able to check your own pulse for safety during  independent exercise       Expected Outcomes Short Term: Able to explain why pulse checking is important during independent exercise;Long Term: Able to check pulse independently and accurately       Understanding of Exercise Prescription Yes       Intervention Provide education, explanation, and written materials on patient's individual exercise prescription       Expected Outcomes Short Term: Able to explain program exercise prescription;Long Term: Able to explain home exercise prescription to exercise independently                Copy of goals given to participant.

## 2021-12-02 NOTE — Patient Instructions (Signed)
Medication Instructions:  ? ?Your physician has recommended you make the following change in your medication:  ? ?1) INCREASE Metoprolol Tartrate 50 mg TWICE daily  ? ?2) STOP Lisinopril-Hydrochlorothiazide  ? ?3) START Lisinopril 10 mg daily  ? ?4) START Furosemide (Lasix) 20 mg daily AS NEEDED for weight gain or edema (swelling)  ? ?*If you need a refill on your cardiac medications before your next appointment, please call your pharmacy* ? ? ?Lab Work: ? ?Today: CBC, CMET, Lipid panel ? ?If you have labs (blood work) drawn today and your tests are completely normal, you will receive your results only by: ?MyChart Message (if you have MyChart) OR ?A paper copy in the mail ?If you have any lab test that is abnormal or we need to change your treatment, we will call you to review the results. ? ? ?Testing/Procedures: ? ?None ordered ? ? ?Follow-Up: ?At [facility], you and your health needs are our priority.  As part of our continuing mission to provide you with exceptional heart care, we have created designated Provider Care Teams.  These Care Teams include your primary Cardiologist (physician) and Advanced Practice Providers (APPs -  Physician Assistants and Nurse Practitioners) who all work together to provide you with the care you need, when you need it. ? ?We recommend signing up for the patient portal called "MyChart".  Sign up information is provided on this After Visit Summary.  MyChart is used to connect with patients for Virtual Visits (Telemedicine).  Patients are able to view lab/test results, encounter notes, upcoming appointments, etc.  Non-urgent messages can be sent to your provider as well.   ?To learn more about what you can do with MyChart, go to [email or web address].   ? ?Your next appointment:   ?1 month(s) ? ?The format for your next appointment:   ?In Person ? ?Provider:   ?You may see [name], MD or one of the following Advanced Practice Providers on your designated Care Team:    ?Murray Hodgkins, NP ?Christell Faith, PA-C ?Cadence Kathlen Mody, PA-C ?

## 2021-12-02 NOTE — Progress Notes (Signed)
Cardiac Individual Treatment Plan  Patient Details  Name: Monica Oliver MRN: [id number] Date of Birth: 1951/07/04 Referring Provider:   Flowsheet Row Cardiac Rehab from 12/02/2021 in [facility] Cardiac and Pulmonary Rehab  Referring Provider End       Initial Encounter Date:  Flowsheet Row Cardiac Rehab from 12/02/2021 in [facility] Cardiac and Pulmonary Rehab  Date 12/02/21       Visit Diagnosis: Status post coronary artery stent placement  Patient's Home Medications on Admission:  Current Outpatient Medications:    albuterol (PROVENTIL) (2.5 MG/3ML) 0.083% nebulizer solution, TAKE 3 MLS (2.5 MG TOTAL) BY NEBULIZATION EVERY 6 HOURS AS NEEDED FOR WHEEZING OR SHORTNESS OF BREATH., Disp: 75 mL, Rfl: 5   albuterol (VENTOLIN HFA) 108 (90 Base) MCG/ACT inhaler, INHALE 2 PUFFS BY MOUTH EVERY 4 HOURS AS NEEDED, Disp: 18 g, Rfl: 12   amLODipine (NORVASC) 5 MG tablet, Take 1 tablet (5 mg total) by mouth daily., Disp: 30 tablet, Rfl: 0   aspirin 81 MG tablet, Take 81 mg by mouth daily., Disp: , Rfl:    budesonide-formoterol (SYMBICORT) 160-4.5 MCG/ACT inhaler, INHALE 2 PUFFS BY MOUTH TWO TIMES DAILY, Disp: 10.2 g, Rfl: 5   cetirizine (ZYRTEC) 10 MG tablet, Take 10 mg by mouth daily as needed for allergies., Disp: , Rfl:    cholecalciferol (VITAMIN D3) 25 MCG (1000 UNIT) tablet, Take 1,000 Units by mouth daily., Disp: , Rfl:    clopidogrel (PLAVIX) 75 MG tablet, Take 1 tablet (75 mg total) by mouth daily with breakfast., Disp: 30 tablet, Rfl: 2   ezetimibe (ZETIA) 10 MG tablet, Take 1 tablet (10 mg total) by mouth daily., Disp: 30 tablet, Rfl: 2   ferrous sulfate 325 (65 FE) MG tablet, TAKE 1 TABLET BY MOUTH EVERY OTHER DAY FOR ANEMIA (SPANISH), Disp: 90 tablet, Rfl: 2   fluticasone (FLONASE) 50 MCG/ACT nasal spray, Place 1 spray into both nostrils daily as needed for allergies or rhinitis., Disp: 16 g, Rfl: 12   furosemide (LASIX) 20 MG tablet, Take 1 tablet (20 mg total) by mouth daily as needed (weight  gain / edema)., Disp: 30 tablet, Rfl: 2   gabapentin (NEURONTIN) 300 MG capsule, Take 300 mg by mouth 2 (two) times daily., Disp: , Rfl:    glucose blood (FREESTYLE LITE) test strip, Test once a day as directed, Disp: 100 each, Rfl: 11   hydrocortisone cream 1 %, Apply 1 application topically 2 (two) times daily as needed for itching., Disp: , Rfl:    lisinopril (ZESTRIL) 10 MG tablet, Take 1 tablet (10 mg total) by mouth daily., Disp: 30 tablet, Rfl: 1   metFORMIN (GLUCOPHAGE-XR) 500 MG 24 hr tablet, Take 2 tablets by mouth twice a day for diabetes, Disp: 120 tablet, Rfl: 10   metoprolol tartrate (LOPRESSOR) 50 MG tablet, Take 1 tablet (50 mg total) by mouth 2 (two) times daily., Disp: 60 tablet, Rfl: 1   nitroGLYCERIN (NITROSTAT) 0.4 MG SL tablet, Place 1 tablet (0.4 mg total) under the tongue every 5 (five) minutes as needed for chest pain. Maximum of 3 doses., Disp: 25 tablet, Rfl: 3   omeprazole (PRILOSEC) 20 MG capsule, TAKE 1 CAPSULE BY MOUTH ONCE A DAY AS NEEDED FOR HEARTBURN, Disp: 90 capsule, Rfl: 1   rosuvastatin (CRESTOR) 5 MG tablet, Take 1 tablet (5 mg total) by mouth every Monday, Wednesday, and Friday., Disp: 12 tablet, Rfl: 3   Tiotropium Bromide Monohydrate (SPIRIVA RESPIMAT) 1.25 MCG/ACT AERS, INHALE 2 PUFFS INTO THE LUNGS DAILY., Disp:  4 g, Rfl: 5   TRUE METRIX BLOOD GLUCOSE TEST test strip, , Disp: , Rfl: 99   zinc gluconate 50 MG tablet, Take 50 mg by mouth daily., Disp: , Rfl:   Past Medical History: Past Medical History:  Diagnosis Date   Allergy    environmental   Arthritis    Asthma    CAD (coronary artery disease)    a. 08/2018 Cardiac CTA: CAC score 306 (91st %'ile). LM nl, LAD nl, LCX small w/ <50% mid stenosis. RI nl, RCA <50% mid, 51-69% distal (CTFFR of rPL 0.52); b. 12/2020 Cath: LM nl, LAD nl, D2 20, LCX nl, RCA 20p/m, RPAV 85. EF 55-65%-->Med Rx.   Claudication ([place])    a. 08/2019 ABI's nl.   COPD (chronic obstructive pulmonary disease) (HCC)    Diabetes  mellitus without complication (HCC)    Diastolic dysfunction    a. 10/2018 Echo: EF 55-60%, Gr1 DD. Nl RV size/fxn.   GERD (gastroesophageal reflux disease)    Hyperlipidemia    Hypertension    Sleep apnea    C-Pap machine   Tobacco abuse     Tobacco Use: Social History   Tobacco Use  Smoking Status Former   Packs/day: 0.75   Years: 40.00   Pack years: 30.00   Types: Cigarettes   Start date: 12/15/1971  Smokeless Tobacco Never  Tobacco Comments   1 cig daily--01/26/2021    Labs: Recent Review Flowsheet Data     Labs for ITP Cardiac and Pulmonary Rehab Latest Ref Rng & Units 02/12/2020 12/16/2020 09/24/2021   Cholestrol 0 - 200 mg/dL - 161 -   LDLCALC 0 - 99 mg/dL - 63 -   HDL >40 mg/dL - 55 -   Trlycerides <150 mg/dL - 213(H) -   Hemoglobin A1c 4.8 - 5.6 % 5.9(H) - 6.6(H)        Exercise Target Goals: Exercise Program Goal: Individual exercise prescription set using results from initial 6 min walk test and THRR while considering  patients activity barriers and safety.   Exercise Prescription Goal: Initial exercise prescription builds to 30-45 minutes a day of aerobic activity, 2-3 days per week.  Home exercise guidelines will be given to patient during program as part of exercise prescription that the participant will acknowledge.   Education: Aerobic Exercise: - Group verbal and visual presentation on the components of exercise prescription. Introduces F.I.T.T principle from ACSM for exercise prescriptions.  Reviews F.I.T.T. principles of aerobic exercise including progression. Written material given at graduation.   Education: Resistance Exercise: - Group verbal and visual presentation on the components of exercise prescription. Introduces F.I.T.T principle from ACSM for exercise prescriptions  Reviews F.I.T.T. principles of resistance exercise including progression. Written material given at graduation.    Education: Exercise & Equipment Safety: - Individual  verbal instruction and demonstration of equipment use and safety with use of the equipment. Flowsheet Row Cardiac Rehab from 12/02/2021 in [facility] Cardiac and Pulmonary Rehab  Date 12/02/21  Educator AS  Instruction Review Code 1- Verbalizes Understanding       Education: Exercise Physiology & General Exercise Guidelines: - Group verbal and written instruction with models to review the exercise physiology of the cardiovascular system and associated critical values. Provides general exercise guidelines with specific guidelines to those with heart or lung disease.  Flowsheet Row Cardiac Rehab from 12/02/2021 in [facility] Cardiac and Pulmonary Rehab  Education need identified 12/02/21       Education: Flexibility, Balance, Mind/Body Relaxation: - Group verbal  and visual presentation with interactive activity on the components of exercise prescription. Introduces F.I.T.T principle from ACSM for exercise prescriptions. Reviews F.I.T.T. principles of flexibility and balance exercise training including progression. Also discusses the mind body connection.  Reviews various relaxation techniques to help reduce and manage stress (i.e. Deep breathing, progressive muscle relaxation, and visualization). Balance handout provided to take home. Written material given at graduation.   Activity Barriers & Risk Stratification:  Activity Barriers & Cardiac Risk Stratification - 11/15/21 1534       Activity Barriers & Cardiac Risk Stratification   Activity Barriers Shortness of Breath;Back Problems;Arthritis    Cardiac Risk Stratification High             6 Minute Walk:  6 Minute Walk     Row Name 12/02/21 1650         6 Minute Walk   Phase Initial     Distance 1002 feet     Walk Time 6 minutes     # of Rest Breaks 0     MPH 1.9     METS 2.37     RPE 11     Perceived Dyspnea  3     VO2 Peak 8.3     Symptoms Yes (comment)     Comments SOB     Resting HR 86 bpm     Resting BP 118/64     Resting  Oxygen Saturation  97 %     Exercise Oxygen Saturation  during 6 min walk 97 %     Max Ex. HR 108 bpm     Max Ex. BP 152/64     2 Minute Post BP 118/62              Oxygen Initial Assessment:   Oxygen Re-Evaluation:   Oxygen Discharge (Final Oxygen Re-Evaluation):   Initial Exercise Prescription:  Initial Exercise Prescription - 12/02/21 1600       Date of Initial Exercise RX and Referring Provider   Date 12/02/21    Referring Provider End      Oxygen   Maintain Oxygen Saturation 88% or higher      Treadmill   MPH 1.5    Grade 0.5    Minutes 15    METs 2.25      Recumbant Bike   Level 1    RPM 60    Minutes 15    METs 2.3      NuStep   Level 2    SPM 80    Minutes 15    METs 2.3      Track   Laps 20    Minutes 15    METs 2.1      Prescription Details   Frequency (times per week) 3    Duration Progress to 30 minutes of continuous aerobic without signs/symptoms of physical distress      Intensity   THRR 40-80% of Max Heartrate 112-138    Ratings of Perceived Exertion 11-13    Perceived Dyspnea 0-4      Resistance Training   Training Prescription Yes    Weight 3 lb    Reps 10-15             Perform Capillary Blood Glucose checks as needed.  Exercise Prescription Changes:   Exercise Prescription Changes     Row Name 12/02/21 1600             Response to Exercise   Blood Pressure (Admit) 118/64  Blood Pressure (Exercise) 152/64       Blood Pressure (Exit) 118/62       Heart Rate (Admit) 86 bpm       Heart Rate (Exercise) 108 bpm       Heart Rate (Exit) 88 bpm       Oxygen Saturation (Admit) 97 %       Oxygen Saturation (Exercise) 97 %       Rating of Perceived Exertion (Exercise) 11       Perceived Dyspnea (Exercise) 3       Symptoms SOB                Exercise Comments:   Exercise Goals and Review:   Exercise Goals     Row Name 12/02/21 1654             Exercise Goals   Increase Physical Activity  Yes       Intervention Provide advice, education, support and counseling about physical activity/exercise needs.;Develop an individualized exercise prescription for aerobic and resistive training based on initial evaluation findings, risk stratification, comorbidities and participant's personal goals.       Expected Outcomes Short Term: Attend rehab on a regular basis to increase amount of physical activity.;Long Term: Add in home exercise to make exercise part of routine and to increase amount of physical activity.;Long Term: Exercising regularly at least 3-5 days a week.       Increase Strength and Stamina Yes       Intervention Provide advice, education, support and counseling about physical activity/exercise needs.;Develop an individualized exercise prescription for aerobic and resistive training based on initial evaluation findings, risk stratification, comorbidities and participant's personal goals.       Expected Outcomes Short Term: Increase workloads from initial exercise prescription for resistance, speed, and METs.;Short Term: Perform resistance training exercises routinely during rehab and add in resistance training at home;Long Term: Improve cardiorespiratory fitness, muscular endurance and strength as measured by increased METs and functional capacity (6MWT)       Able to understand and use rate of perceived exertion (RPE) scale Yes       Intervention Provide education and explanation on how to use RPE scale       Expected Outcomes Short Term: Able to use RPE daily in rehab to express subjective intensity level;Long Term:  Able to use RPE to guide intensity level when exercising independently       Able to understand and use Dyspnea scale Yes       Intervention Provide education and explanation on how to use Dyspnea scale       Expected Outcomes Short Term: Able to use Dyspnea scale daily in rehab to express subjective sense of shortness of breath during exertion;Long Term: Able to use  Dyspnea scale to guide intensity level when exercising independently       Knowledge and understanding of Target Heart Rate Range (THRR) Yes       Intervention Provide education and explanation of THRR including how the numbers were predicted and where they are located for reference       Expected Outcomes Short Term: Able to state/look up THRR;Long Term: Able to use THRR to govern intensity when exercising independently       Able to check pulse independently Yes       Intervention Provide education and demonstration on how to check pulse in carotid and radial arteries.;Review the importance of being able to check your own pulse for safety  during independent exercise       Expected Outcomes Short Term: Able to explain why pulse checking is important during independent exercise;Long Term: Able to check pulse independently and accurately       Understanding of Exercise Prescription Yes       Intervention Provide education, explanation, and written materials on patient's individual exercise prescription       Expected Outcomes Short Term: Able to explain program exercise prescription;Long Term: Able to explain home exercise prescription to exercise independently                Exercise Goals Re-Evaluation :   Discharge Exercise Prescription (Final Exercise Prescription Changes):  Exercise Prescription Changes - 12/02/21 1600       Response to Exercise   Blood Pressure (Admit) 118/64    Blood Pressure (Exercise) 152/64    Blood Pressure (Exit) 118/62    Heart Rate (Admit) 86 bpm    Heart Rate (Exercise) 108 bpm    Heart Rate (Exit) 88 bpm    Oxygen Saturation (Admit) 97 %    Oxygen Saturation (Exercise) 97 %    Rating of Perceived Exertion (Exercise) 11    Perceived Dyspnea (Exercise) 3    Symptoms SOB             Nutrition:  Target Goals: Understanding of nutrition guidelines, daily intake of sodium '1500mg'$ , cholesterol '200mg'$ , calories 30% from fat and 7% or less from  saturated fats, daily to have 5 or more servings of fruits and vegetables.  Education: All About Nutrition: -Group instruction provided by verbal, written material, interactive activities, discussions, models, and posters to present general guidelines for heart healthy nutrition including fat, fiber, MyPlate, the role of sodium in heart healthy nutrition, utilization of the nutrition label, and utilization of this knowledge for meal planning. Follow up email sent as well. Written material given at graduation. Flowsheet Row Cardiac Rehab from 12/02/2021 in [facility] Cardiac and Pulmonary Rehab  Education need identified 12/02/21       Biometrics:  Pre Biometrics - 12/02/21 1655       Pre Biometrics   Height '5\' 3"'$  (1.6 m)    Weight 180 lb 6.4 oz (81.8 kg)    BMI (Calculated) 31.96    Single Leg Stand 15.13 seconds              Nutrition Therapy Plan and Nutrition Goals:   Nutrition Assessments:  MEDIFICTS Score Key: ?70 Need to make dietary changes  40-70 Heart Healthy Diet ? 40 Therapeutic Level Cholesterol Diet  Flowsheet Row Cardiac Rehab from 12/02/2021 in [facility] Cardiac and Pulmonary Rehab  Picture Your Plate Total Score on Admission 67      Picture Your Plate Scores: <76 Unhealthy dietary pattern with much room for improvement. 41-50 Dietary pattern unlikely to meet recommendations for good health and room for improvement. 51-60 More healthful dietary pattern, with some room for improvement.  >60 Healthy dietary pattern, although there may be some specific behaviors that could be improved.    Nutrition Goals Re-Evaluation:   Nutrition Goals Discharge (Final Nutrition Goals Re-Evaluation):   Psychosocial: Target Goals: Acknowledge presence or absence of significant depression and/or stress, maximize coping skills, provide positive support system. Participant is able to verbalize types and ability to use techniques and skills needed for reducing stress and depression.    Education: Stress, Anxiety, and Depression - Group verbal and visual presentation to define topics covered.  Reviews how body is impacted by stress, anxiety, and  depression.  Also discusses healthy ways to reduce stress and to treat/manage anxiety and depression.  Written material given at graduation.   Education: Sleep Hygiene -Provides group verbal and written instruction about how sleep can affect your health.  Define sleep hygiene, discuss sleep cycles and impact of sleep habits. Review good sleep hygiene tips.    Initial Review & Psychosocial Screening:  Initial Psych Review & Screening - 11/15/21 1541       Initial Review   Current issues with None Identified      Family Dynamics   Good Support System? Yes   daughters     Barriers   Psychosocial barriers to participate in program There are no identifiable barriers or psychosocial needs.;The patient should benefit from training in stress management and relaxation.      Screening Interventions   Interventions Encouraged to exercise;To provide support and resources with identified psychosocial needs    Expected Outcomes Short Term goal: Utilizing psychosocial counselor, staff and physician to assist with identification of specific Stressors or current issues interfering with healing process. Setting desired goal for each stressor or current issue identified.;Long Term Goal: Stressors or current issues are controlled or eliminated.;Short Term goal: Identification and review with participant of any Quality of Life or Depression concerns found by scoring the questionnaire.;Long Term goal: The participant improves quality of Life and PHQ9 Scores as seen by post scores and/or verbalization of changes             Quality of Life Scores:   Quality of Life - 12/02/21 1649       Quality of Life   Select Quality of Life      Quality of Life Scores   Health/Function Pre 26.57 %    Socioeconomic Pre 30 %    Psych/Spiritual Pre  29.14 %    Family Pre 30 %    GLOBAL Pre 28.31 %            Scores of 19 and below usually indicate a poorer quality of life in these areas.  A difference of  2-3 points is a clinically meaningful difference.  A difference of 2-3 points in the total score of the Quality of Life Index has been associated with significant improvement in overall quality of life, self-image, physical symptoms, and general health in studies assessing change in quality of life.  PHQ-9: Recent Review Flowsheet Data     Depression screen [facility] 2/9 12/02/2021 09/05/2016 06/13/2016 03/07/2016 11/02/2015   Decreased Interest 0 0 0 0 0   Down, Depressed, Hopeless 0 0 0 0 0   PHQ - 2 Score 0 0 0 0 0   Altered sleeping 0 - - - -   Tired, decreased energy 1 - - - -   Change in appetite 0 - - - -   Feeling bad or failure about yourself  0 - - - -   Trouble concentrating 0 - - - -   Moving slowly or fidgety/restless 0 - - - -   Suicidal thoughts 0 - - - -   PHQ-9 Score 1 - - - -   Difficult doing work/chores Not difficult at all - - - -      Interpretation of Total Score  Total Score Depression Severity:  1-4 = Minimal depression, 5-9 = Mild depression, 10-14 = Moderate depression, 15-19 = Moderately severe depression, 20-27 = Severe depression   Psychosocial Evaluation and Intervention:  Psychosocial Evaluation - 11/15/21 1547  Psychosocial Evaluation & Interventions   Interventions Encouraged to exercise with the program and follow exercise prescription    Comments Ms. Trickel is coming to cardiac rehab after receiving a stent. She states she can feel a difference and has more energy. 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 She reports she has a great support system with her daughters and friends. She is looking forward to coming to cardiac rehab to develop a routine.    Expected Outcomes Short: attend cardiac rehab for  education and exercise. Long: develop and maintain positive self care habits.    Continue Psychosocial Services  Follow up required by staff             Psychosocial Re-Evaluation:   Psychosocial Discharge (Final Psychosocial Re-Evaluation):   Vocational Rehabilitation: Provide vocational rehab assistance to qualifying candidates.   Vocational Rehab Evaluation & Intervention:  Vocational Rehab - 11/15/21 1540       Initial Vocational Rehab Evaluation & Intervention   Assessment shows need for Vocational Rehabilitation No             Education: Education Goals: Education classes will be provided on a variety of topics geared toward better understanding of heart health and risk factor modification. Participant will state understanding/return demonstration of topics presented as noted by education test scores.  Learning Barriers/Preferences:  Learning Barriers/Preferences - 11/15/21 1540       Learning Barriers/Preferences   Learning Barriers None    Learning Preferences None             General Cardiac Education Topics:  AED/CPR: - Group verbal and written instruction with the use of models to demonstrate the basic use of the AED with the basic ABC's of resuscitation.   Anatomy and Cardiac Procedures: - Group verbal and visual presentation and models provide information about basic cardiac anatomy and function. Reviews the testing methods done to diagnose heart disease and the outcomes of the test results. Describes the treatment choices: Medical Management, Angioplasty, or Coronary Bypass Surgery for treating various heart conditions including Myocardial Infarction, Angina, Valve Disease, and Cardiac Arrhythmias.  Written material given at graduation. Flowsheet Row Cardiac Rehab from 12/02/2021 in [facility] Cardiac and Pulmonary Rehab  Education need identified 12/02/21       Medication Safety: - Group verbal and visual instruction to review commonly prescribed  medications for heart and lung disease. Reviews the medication, class of the drug, and side effects. Includes the steps to properly store meds and maintain the prescription regimen.  Written material given at graduation.   Intimacy: - Group verbal instruction through game format to discuss how heart and lung disease can affect sexual intimacy. Written material given at graduation..   Know Your Numbers and Heart Failure: - Group verbal and visual instruction to discuss disease risk factors for cardiac and pulmonary disease and treatment options.  Reviews associated critical values for Overweight/Obesity, Hypertension, Cholesterol, and Diabetes.  Discusses basics of heart failure: signs/symptoms and treatments.  Introduces Heart Failure Zone chart for action plan for heart failure.  Written material given at graduation.   Infection Prevention: - Provides verbal and written material to individual with discussion of infection control including proper hand washing and proper equipment cleaning during exercise session. Flowsheet Row Cardiac Rehab from 12/02/2021 in [facility] Cardiac and Pulmonary Rehab  Date 12/02/21  Educator AS  Instruction Review Code 1- Verbalizes Understanding  Falls Prevention: - Provides verbal and written material to individual with discussion of falls prevention and safety. Flowsheet Row Cardiac Rehab from 12/02/2021 in [facility] Cardiac and Pulmonary Rehab  Date 12/02/21  Educator AS  Instruction Review Code 1- Verbalizes Understanding       Other: -Provides group and verbal instruction on various topics (see comments)   Knowledge Questionnaire Score:  Knowledge Questionnaire Score - 12/02/21 1650       Knowledge Questionnaire Score   Pre Score 22/26             Core Components/Risk Factors/Patient Goals at Admission:  Personal Goals and Risk Factors at Admission - 12/02/21 1655       Core Components/Risk Factors/Patient Goals on Admission    Weight  Management Yes    Intervention Weight Management/Obesity: Establish reasonable short term and long term weight goals.    Admit Weight 180 lb 6.4 oz (81.8 kg)    Goal Weight: Short Term 175 lb (79.4 kg)    Goal Weight: Long Term 170 lb (77.1 kg)    Expected Outcomes Short Term: Continue to assess and modify interventions until short term weight is achieved;Long Term: Adherence to nutrition and physical activity/exercise program aimed toward attainment of established weight goal;Weight Loss: Understanding of general recommendations for a balanced deficit meal plan, which promotes 1-2 lb weight loss per week and includes a negative energy balance of [phone] kcal/d;Understanding recommendations for meals to include 15-35% energy as protein, 25-35% energy from fat, 35-60% energy from carbohydrates, less than '200mg'$  of dietary cholesterol, 20-35 gm of total fiber daily;Understanding of distribution of calorie intake throughout the day with the consumption of 4-5 meals/snacks    Diabetes Yes    Intervention Provide education about signs/symptoms and action to take for hypo/hyperglycemia.;Provide education about proper nutrition, including hydration, and aerobic/resistive exercise prescription along with prescribed medications to achieve blood glucose in normal ranges: Fasting glucose 65-99 mg/dL    Expected Outcomes Short Term: Participant verbalizes understanding of the signs/symptoms and immediate care of hyper/hypoglycemia, proper foot care and importance of medication, aerobic/resistive exercise and nutrition plan for blood glucose control.;Long Term: Attainment of HbA1C < 7%.    Hypertension Yes    Intervention Provide education on lifestyle modifcations including regular physical activity/exercise, weight management, moderate sodium restriction and increased consumption of fresh fruit, vegetables, and low fat dairy, alcohol moderation, and smoking cessation.;Monitor prescription use compliance.    Lipids  Yes    Intervention Provide education and support for participant on nutrition & aerobic/resistive exercise along with prescribed medications to achieve LDL '70mg'$ , HDL >'40mg'$ .    Expected Outcomes Short Term: Participant states understanding of desired cholesterol values and is compliant with medications prescribed. Participant is following exercise prescription and nutrition guidelines.;Long Term: Cholesterol controlled with medications as prescribed, with individualized exercise RX and with personalized nutrition plan. Value goals: LDL < '70mg'$ , HDL > 40 mg.             Education:Diabetes - Individual verbal and written instruction to review signs/symptoms of diabetes, desired ranges of glucose level fasting, after meals and with exercise. Acknowledge that pre and post exercise glucose checks will be done for 3 sessions at entry of program. [place] from 11/15/2021 in [facility] Cardiac and Pulmonary Rehab  Date 11/15/21  Educator [facility]  Instruction Review Code 1- Verbalizes Understanding       Core Components/Risk Factors/Patient Goals Review:    Core Components/Risk Factors/Patient Goals at Discharge (Final Review):    ITP  Comments:  ITP Comments     Row Name 11/15/21 1558           ITP Comments Initial telephone completed. Diagnosis can be found in [facility] 12/29. EP orientation scheduled for Thursday 3/9 at 3pm.                Comments: initial ITP

## 2021-12-03 ENCOUNTER — Other Ambulatory Visit: Payer: Self-pay

## 2021-12-03 ENCOUNTER — Ambulatory Visit: Payer: BC Managed Care – PPO | Admitting: Internal Medicine

## 2021-12-03 LAB — LIPID PANEL
Chol/HDL Ratio: 2.7 ratio (ref 0.0–4.4)
Cholesterol, Total: 123 mg/dL (ref 100–199)
HDL: 45 mg/dL (ref >39)
LDL Chol Calc (NIH): 51 mg/dL (ref 0–99)
Triglycerides: 162 mg/dL — ABNORMAL HIGH (ref 0–149)
VLDL Cholesterol Cal: 27 mg/dL (ref 5–40)

## 2021-12-03 LAB — COMPREHENSIVE METABOLIC PANEL
ALT: 15 IU/L (ref 0–32)
AST: 18 IU/L (ref 0–40)
Albumin/Globulin Ratio: 2 (ref 1.2–2.2)
Albumin: 4.5 g/dL (ref 3.8–4.8)
Alkaline Phosphatase: 100 IU/L (ref 44–121)
BUN/Creatinine Ratio: 16 (ref 12–28)
BUN: 14 mg/dL (ref 8–27)
Bilirubin Total: 0.2 mg/dL (ref 0.0–1.2)
CO2: 22 mmol/L (ref 20–29)
Calcium: 9.9 mg/dL (ref 8.7–10.3)
Chloride: 97 mmol/L (ref 96–106)
Creatinine, Ser: 0.88 mg/dL (ref 0.57–1.00)
Globulin, Total: 2.3 g/dL (ref 1.5–4.5)
Glucose: 114 mg/dL — ABNORMAL HIGH (ref 70–99)
Potassium: 4.1 mmol/L (ref 3.5–5.2)
Sodium: 138 mmol/L (ref 134–144)
Total Protein: 6.8 g/dL (ref 6.0–8.5)
eGFR: 71 mL/min/{1.73_m2} (ref >59)

## 2021-12-03 LAB — CBC
Hematocrit: 35.8 % (ref 34.0–46.6)
Hemoglobin: 11.3 g/dL (ref 11.1–15.9)
MCH: 25.1 pg — ABNORMAL LOW (ref 26.6–33.0)
MCHC: 31.6 g/dL (ref 31.5–35.7)
MCV: 80 fL (ref 79–97)
Platelets: 370 10*3/uL (ref 150–450)
RBC: 4.5 x10E6/uL (ref 3.77–5.28)
RDW: 14.7 % (ref 11.7–15.4)
WBC: 8.5 10*3/uL (ref 3.4–10.8)

## 2021-12-06 ENCOUNTER — Other Ambulatory Visit: Payer: Self-pay

## 2021-12-06 ENCOUNTER — Encounter: Payer: BC Managed Care – PPO | Admitting: *Deleted

## 2021-12-06 DIAGNOSIS — Z955 Presence of coronary angioplasty implant and graft: Secondary | ICD-10-CM | POA: Diagnosis not present

## 2021-12-06 LAB — GLUCOSE, CAPILLARY
Glucose-Capillary: 100 mg/dL — ABNORMAL HIGH (ref 70–99)
Glucose-Capillary: 100 mg/dL — ABNORMAL HIGH (ref 70–99)

## 2021-12-06 NOTE — Progress Notes (Signed)
Daily Session Note ? ?Patient Details  ?Name: Monica Oliver ?MRN: [id number] ?Date of Birth: 01/15/1951 ?Referring Provider:   ?Flowsheet Row Cardiac Rehab from 12/02/2021 in [facility] Cardiac and Pulmonary Rehab  ?Referring Provider End  ? ?  ? ? ?Encounter Date: 12/06/2021 ? ?Check In: ? Session Check In - 12/06/21 1733   ? ?  ? Check-In  ? Supervising physician immediately available to respond to emergencies See telemetry face sheet for immediately available ER MD   ? Location [facility]-Cardiac & Pulmonary Rehab   ? Staff Present Meredith Craven, RN BSN;Joseph Hood, RCP,RRT,BSRT;Kara Langdon, MS, ASCM CEP, Exercise Physiologist   ? Virtual Visit No   ? Medication changes reported     No   ? Fall or balance concerns reported    No   ? Warm-up and Cool-down Performed on first and last piece of equipment   ? Resistance Training Performed Yes   ? VAD Patient? No   ? PAD/SET Patient? No   ?  ? Pain Assessment  ? Currently in Pain? No/denies   ? ?  ?  ? ?  ? ? ? ? ? ?Social History  ? ?Tobacco Use  ?Smoking Status Former  ? Packs/day: 0.75  ? Years: 40.00  ? Pack years: 30.00  ? Types: Cigarettes  ? Start date: 12/15/1971  ?Smokeless Tobacco Never  ?Tobacco Comments  ? 1 cig daily--01/26/2021  ? ? ?Goals Met:  ?Independence with exercise equipment ?Exercise tolerated well ?No report of concerns or symptoms today ?Strength training completed today ? ?Goals Unmet:  ?Not Applicable ? ?Comments: First full day of exercise!  Patient was oriented to gym and equipment including functions, settings, policies, and procedures.  Patient's individual exercise prescription and treatment plan were reviewed.  All starting workloads were established based on the results of the 6 minute walk test done at initial orientation visit.  The plan for exercise progression was also introduced and progression will be customized based on patient's performance and goals. ? ? ? ?Dr. Mark Miller is Medical Director for [facility].  ?Dr. Fuad  Aleskerov is Medical Director for [facility]. ?

## 2021-12-08 ENCOUNTER — Telehealth: Payer: Self-pay | Admitting: Internal Medicine

## 2021-12-08 ENCOUNTER — Other Ambulatory Visit: Payer: Self-pay

## 2021-12-08 DIAGNOSIS — Z955 Presence of coronary angioplasty implant and graft: Secondary | ICD-10-CM

## 2021-12-08 LAB — GLUCOSE, CAPILLARY: Glucose-Capillary: 140 mg/dL — ABNORMAL HIGH (ref 70–99)

## 2021-12-08 NOTE — Telephone Encounter (Signed)
Application has been placed in Dr. Zoila Shutter folder for signature.  ? ?

## 2021-12-08 NOTE — Progress Notes (Signed)
Daily Session Note ? ?Patient Details  ?Name: Monica Oliver ?MRN: [id number] ?Date of Birth: 1950-11-07 ?Referring Provider:   ?Flowsheet Row Cardiac Rehab from 12/02/2021 in [facility] Cardiac and Pulmonary Rehab  ?Referring Provider End  ? ?  ? ? ?Encounter Date: 12/08/2021 ? ?Check In: ? Session Check In - 12/08/21 1721   ? ?  ? Check-In  ? Supervising physician immediately available to respond to emergencies See telemetry face sheet for immediately available ER MD   ? Location [facility]-Cardiac & Pulmonary Rehab   ? Staff Present Birdie Sons, MPA, RN;Joseph [place], RCP,RRT,BSRT;Kara [place], MS, ASCM CEP, Exercise Physiologist   ? Virtual Visit No   ? Medication changes reported     No   ? Fall or balance concerns reported    No   ? Tobacco Cessation No Change   ? Warm-up and Cool-down Performed on first and last piece of equipment   ? Resistance Training Performed Yes   ? VAD Patient? No   ? PAD/SET Patient? No   ?  ? Pain Assessment  ? Currently in Pain? No/denies   ? ?  ?  ? ?  ? ? ? ? ? ?Social History  ? ?Tobacco Use  ?Smoking Status Former  ? Packs/day: 0.75  ? Years: 40.00  ? Pack years: 30.00  ? Types: Cigarettes  ? Start date: 12/15/1971  ?Smokeless Tobacco Never  ?Tobacco Comments  ? 1 cig daily--01/26/2021  ? ? ?Goals Met:  ?Independence with exercise equipment ?Exercise tolerated well ?No report of concerns or symptoms today ?Strength training completed today ? ?Goals Unmet:  ?Not Applicable ? ?Comments: Pt able to follow exercise prescription today without complaint.  Will continue to monitor for progression. ? ? ? ?Dr. Emily Filbert is Medical Director for [place].  ?Dr. Ottie Glazier is Medical Director for [facility] Pulmonary Rehabilitation. ?

## 2021-12-09 ENCOUNTER — Encounter: Payer: BC Managed Care – PPO | Admitting: *Deleted

## 2021-12-09 DIAGNOSIS — Z955 Presence of coronary angioplasty implant and graft: Secondary | ICD-10-CM | POA: Diagnosis not present

## 2021-12-09 LAB — GLUCOSE, CAPILLARY
Glucose-Capillary: 96 mg/dL (ref 70–99)
Glucose-Capillary: 103 mg/dL — ABNORMAL HIGH (ref 70–99)

## 2021-12-09 NOTE — Progress Notes (Signed)
Daily Session Note ? ?Patient Details  ?Name: Monica Oliver ?MRN: [id number] ?Date of Birth: 07/16/1951 ?Referring Provider:   ?Flowsheet Row Cardiac Rehab from 12/02/2021 in [facility] Cardiac and Pulmonary Rehab  ?Referring Provider End  ? ?  ? ? ?Encounter Date: 12/09/2021 ? ?Check In: ? Session Check In - 12/09/21 1731   ? ?  ? Check-In  ? Supervising physician immediately available to respond to emergencies See telemetry face sheet for immediately available ER MD   ? Location [facility]-Cardiac & Pulmonary Rehab   ? Staff Present Meredith Craven, RN BSN;Joseph Hood, RCP,RRT,BSRT;Kara Langdon, MS, ASCM CEP, Exercise Physiologist   ? Virtual Visit No   ? Medication changes reported     No   ? Fall or balance concerns reported    No   ? Warm-up and Cool-down Performed on first and last piece of equipment   ? Resistance Training Performed Yes   ? VAD Patient? No   ? PAD/SET Patient? No   ?  ? Pain Assessment  ? Currently in Pain? No/denies   ? ?  ?  ? ?  ? ? ? ? ? ?Social History  ? ?Tobacco Use  ?Smoking Status Former  ? Packs/day: 0.75  ? Years: 40.00  ? Pack years: 30.00  ? Types: Cigarettes  ? Start date: 12/15/1971  ?Smokeless Tobacco Never  ?Tobacco Comments  ? 1 cig daily--01/26/2021  ? ? ?Goals Met:  ?Independence with exercise equipment ?Exercise tolerated well ?No report of concerns or symptoms today ?Strength training completed today ? ?Goals Unmet:  ?Not Applicable ? ?Comments: Pt able to follow exercise prescription today without complaint.  Will continue to monitor for progression. ? ? ? ?Dr. Mark Miller is Medical Director for [facility].  ?Dr. Fuad Aleskerov is Medical Director for [facility]. ?

## 2021-12-09 NOTE — Telephone Encounter (Signed)
Application placed up front for pickup. ? ?Lm to make patient aware.  ?

## 2021-12-10 DIAGNOSIS — G4733 Obstructive sleep apnea (adult) (pediatric): Secondary | ICD-10-CM | POA: Diagnosis not present

## 2021-12-13 ENCOUNTER — Other Ambulatory Visit: Payer: Self-pay

## 2021-12-13 ENCOUNTER — Encounter: Payer: BC Managed Care – PPO | Admitting: *Deleted

## 2021-12-13 DIAGNOSIS — Z955 Presence of coronary angioplasty implant and graft: Secondary | ICD-10-CM | POA: Diagnosis not present

## 2021-12-13 NOTE — Telephone Encounter (Signed)
Lm x2 for patient.  Will close encounter per office protocol.   

## 2021-12-13 NOTE — Progress Notes (Signed)
Daily Session Note ? ?Patient Details  ?Name: Monica Oliver ?MRN: [id number] ?Date of Birth: 05/18/1951 ?Referring Provider:   ?Flowsheet Row Cardiac Rehab from 12/02/2021 in [facility] Cardiac and Pulmonary Rehab  ?Referring Provider End  ? ?  ? ? ?Encounter Date: 12/13/2021 ? ?Check In: ? Session Check In - 12/13/21 1729   ? ?  ? Check-In  ? Supervising physician immediately available to respond to emergencies See telemetry face sheet for immediately available ER MD   ? Location [facility]-Cardiac & Pulmonary Rehab   ? Staff Present Meredith Craven, RN BSN;Joseph Hood, RCP,RRT,BSRT;Kara [place]don, MS, ASCM CEP, Exercise Physiologist   ? Virtual Visit No   ? Medication changes reported     No   ? Fall or balance concerns reported    No   ? Warm-up and Cool-down Performed on first and last piece of equipment   ? Resistance Training Performed Yes   ? VAD Patient? No   ? PAD/SET Patient? No   ?  ? Pain Assessment  ? Currently in Pain? No/denies   ? ?  ?  ? ?  ? ? ? ? ? ?Social History  ? ?Tobacco Use  ?Smoking Status Former  ? Packs/day: 0.75  ? Years: 40.00  ? Pack years: 30.00  ? Types: Cigarettes  ? Start date: 12/15/1971  ?Smokeless Tobacco Never  ?Tobacco Comments  ? 1 cig daily--01/26/2021  ? ? ?Goals Met:  ?Independence with exercise equipment ?Exercise tolerated well ?No report of concerns or symptoms today ?Strength training completed today ? ?Goals Unmet:  ?Not Applicable ? ?Comments: Pt able to follow exercise prescription today without complaint.  Will continue to monitor for progression. ? ? ? ?Dr. Mark Miller is Medical Director for [facility].  ?Dr. Fuad Aleskerov is Medical Director for [facility]. ?

## 2021-12-14 ENCOUNTER — Other Ambulatory Visit: Payer: Self-pay

## 2021-12-14 MED ORDER — TIZANIDINE HCL 4 MG PO TABS
ORAL_TABLET | ORAL | 0 refills | Status: DC
  Filled 2021-12-14: qty 30, 10d supply, fill #0

## 2021-12-15 ENCOUNTER — Other Ambulatory Visit: Payer: Self-pay

## 2021-12-15 ENCOUNTER — Encounter: Payer: Self-pay | Admitting: *Deleted

## 2021-12-15 DIAGNOSIS — Z955 Presence of coronary angioplasty implant and graft: Secondary | ICD-10-CM

## 2021-12-15 NOTE — Progress Notes (Signed)
Cardiac Individual Treatment Plan ? ?Patient Details  ?Name: Monica Oliver ?MRN: [id number] ?Date of Birth: 11/05/1950 ?Referring Provider:   ?Flowsheet Row Cardiac Rehab from 12/02/2021 in [facility] Cardiac and Pulmonary Rehab  ?Referring Provider End  ? ?  ? ? ?Initial Encounter Date:  ?Flowsheet Row Cardiac Rehab from 12/02/2021 in [facility] Cardiac and Pulmonary Rehab  ?Date 12/02/21  ? ?  ? ? ?Visit Diagnosis: Status post coronary artery stent placement ? ?Patient's Home Medications on Admission: ? ?Current Outpatient Medications:  ?  albuterol (PROVENTIL) (2.5 MG/3ML) 0.083% nebulizer solution, TAKE 3 MLS (2.5 MG TOTAL) BY NEBULIZATION EVERY 6 HOURS AS NEEDED FOR WHEEZING OR SHORTNESS OF BREATH., Disp: 75 mL, Rfl: 5 ?  albuterol (VENTOLIN HFA) 108 (90 Base) MCG/ACT inhaler, INHALE 2 PUFFS BY MOUTH EVERY 4 HOURS AS NEEDED, Disp: 18 g, Rfl: 12 ?  amLODipine (NORVASC) 5 MG tablet, Take 1 tablet (5 mg total) by mouth daily., Disp: 30 tablet, Rfl: 0 ?  aspirin 81 MG tablet, Take 81 mg by mouth daily., Disp: , Rfl:  ?  budesonide-formoterol (SYMBICORT) 160-4.5 MCG/ACT inhaler, INHALE 2 PUFFS BY MOUTH TWO TIMES DAILY, Disp: 10.2 g, Rfl: 5 ?  cetirizine (ZYRTEC) 10 MG tablet, Take 10 mg by mouth daily as needed for allergies., Disp: , Rfl:  ?  cholecalciferol (VITAMIN D3) 25 MCG (1000 UNIT) tablet, Take 1,000 Units by mouth daily., Disp: , Rfl:  ?  clopidogrel (PLAVIX) 75 MG tablet, Take 1 tablet (75 mg total) by mouth daily with breakfast., Disp: 30 tablet, Rfl: 2 ?  ezetimibe (ZETIA) 10 MG tablet, Take 1 tablet (10 mg total) by mouth daily., Disp: 30 tablet, Rfl: 2 ?  ferrous sulfate 325 (65 FE) MG tablet, TAKE 1 TABLET BY MOUTH EVERY OTHER DAY FOR ANEMIA (SPANISH), Disp: 90 tablet, Rfl: 2 ?  fluticasone (FLONASE) 50 MCG/ACT nasal spray, Place 1 spray into both nostrils daily as needed for allergies or rhinitis., Disp: 16 g, Rfl: 12 ?  furosemide (LASIX) 20 MG tablet, Take 1 tablet (20 mg total) by mouth daily as needed (weight  gain / edema)., Disp: 30 tablet, Rfl: 2 ?  gabapentin (NEURONTIN) 300 MG capsule, Take 300 mg by mouth 2 (two) times daily., Disp: , Rfl:  ?  glucose blood (FREESTYLE LITE) test strip, Test once a day as directed, Disp: 100 each, Rfl: 11 ?  hydrocortisone cream 1 %, Apply 1 application topically 2 (two) times daily as needed for itching., Disp: , Rfl:  ?  lisinopril (ZESTRIL) 10 MG tablet, Take 1 tablet (10 mg total) by mouth daily., Disp: 30 tablet, Rfl: 1 ?  metFORMIN (GLUCOPHAGE-XR) 500 MG 24 hr tablet, Take 2 tablets by mouth twice a day for diabetes, Disp: 120 tablet, Rfl: 10 ?  metoprolol tartrate (LOPRESSOR) 50 MG tablet, Take 1 tablet (50 mg total) by mouth 2 (two) times daily., Disp: 60 tablet, Rfl: 1 ?  nitroGLYCERIN (NITROSTAT) 0.4 MG SL tablet, Place 1 tablet (0.4 mg total) under the tongue every 5 (five) minutes as needed for chest pain. Maximum of 3 doses., Disp: 25 tablet, Rfl: 3 ?  omeprazole (PRILOSEC) 20 MG capsule, TAKE 1 CAPSULE BY MOUTH ONCE A DAY AS NEEDED FOR HEARTBURN, Disp: 90 capsule, Rfl: 1 ?  rosuvastatin (CRESTOR) 5 MG tablet, Take 1 tablet (5 mg total) by mouth every Monday, Wednesday, and Friday., Disp: 12 tablet, Rfl: 3 ?  Tiotropium Bromide Monohydrate (SPIRIVA RESPIMAT) 1.25 MCG/ACT AERS, INHALE 2 PUFFS INTO THE LUNGS DAILY., Disp:  4 g, Rfl: 5 ?  tiZANidine (ZANAFLEX) 4 MG tablet, Take 1 tablet every 8 hours by oral route., Disp: 30 tablet, Rfl: 0 ?  TRUE METRIX BLOOD GLUCOSE TEST test strip, , Disp: , Rfl: 99 ?  zinc gluconate 50 MG tablet, Take 50 mg by mouth daily., Disp: , Rfl:  ? ?Past Medical History: ?Past Medical History:  ?Diagnosis Date  ? Allergy   ? environmental  ? Arthritis   ? Asthma   ? CAD (coronary artery disease)   ? a. 08/2018 Cardiac CTA: CAC score 306 (91st %'ile). LM nl, LAD nl, LCX small w/ <50% mid stenosis. RI nl, RCA <50% mid, 51-69% distal (CTFFR of rPL 0.52); b. 12/2020 Cath: LM nl, LAD nl, D2 20, LCX nl, RCA 20p/m, RPAV 85. EF 55-65%-->Med Rx.  ?  Claudication [facility])   ? a. 08/2019 ABI's nl.  ? COPD (chronic obstructive pulmonary disease) ([place])   ? Diabetes mellitus without complication ([place])   ? Diastolic dysfunction   ? a. 10/2018 Echo: EF 55-60%, Gr1 DD. Nl RV size/fxn.  ? GERD (gastroesophageal reflux disease)   ? Hyperlipidemia   ? Hypertension   ? Sleep apnea   ? C-Pap machine  ? Tobacco abuse   ? ? ?Tobacco Use: ?Social History  ? ?Tobacco Use  ?Smoking Status Former  ? Packs/day: 0.75  ? Years: 40.00  ? Pack years: 30.00  ? Types: Cigarettes  ? Start date: 12/15/1971  ?Smokeless Tobacco Never  ?Tobacco Comments  ? 1 cig daily--01/26/2021  ? ? ?Labs: ?Review Flowsheet   ? ?  ?  Latest Ref Rng & Units 02/12/2020 12/16/2020 09/24/2021 12/02/2021  ?Labs for [facility]P Cardiac and Pulmonary Rehab  ?Cholestrol 100 - 199 mg/dL  161    123    ?LDL (calc) 0 - 99 mg/dL  63    51    ?HDL-C >39 mg/dL  55    45    ?Trlycerides 0 - 149 mg/dL  213    162    ?Hemoglobin A1c 4.8 - 5.6 % 5.9    6.6     ?  ? ? Multiple values from one day are sorted in reverse-chronological order  ?  ?  ? ? ? ?Exercise Target Goals: ?Exercise Program Goal: ?Individual exercise prescription set using results from initial 6 min walk test and THRR while considering  patient?s activity barriers and safety.  ? ?Exercise Prescription Goal: ?Initial exercise prescription builds to 30-45 minutes a day of aerobic activity, 2-3 days per week.  Home exercise guidelines will be given to patient during program as part of exercise prescription that the participant will acknowledge. ? ? ?Education: Aerobic Exercise: ?- Group verbal and visual presentation on the components of exercise prescription. Introduces F.I.T.T principle from ACSM for exercise prescriptions.  Reviews F.I.T.T. principles of aerobic exercise including progression. Written material given at graduation. ? ? ?Education: Resistance Exercise: ?- Group verbal and visual presentation on the components of exercise prescription. Introduces F.I.T.T  principle from ACSM for exercise prescriptions  Reviews F.I.T.T. principles of resistance exercise including progression. Written material given at graduation. ? ?  ?Education: Exercise & Equipment Safety: ?- Individual verbal instruction and demonstration of equipment use and safety with use of the equipment. ?Flowsheet Row Cardiac Rehab from 12/08/2021 in [facility] Cardiac and Pulmonary Rehab  ?Date 12/02/21  ?Educator AS  ?Instruction Review Code 1- Verbalizes Understanding  ? ?  ? ? ?Education: Exercise Physiology & General Exercise Guidelines: ?- Group verbal and  written instruction with models to review the exercise physiology of the cardiovascular system and associated critical values. Provides general exercise guidelines with specific guidelines to those with heart or lung disease.  ?Flowsheet Row Cardiac Rehab from 12/08/2021 in [facility] Cardiac and Pulmonary Rehab  ?Education need identified 12/02/21  ? ?  ? ? ?Education: Flexibility, Balance, Mind/Body Relaxation: ?- Group verbal and visual presentation with interactive activity on the components of exercise prescription. Introduces F.I.T.T principle from ACSM for exercise prescriptions. Reviews F.I.T.T. principles of flexibility and balance exercise training including progression. Also discusses the mind body connection.  Reviews various relaxation techniques to help reduce and manage stress (i.e. Deep breathing, progressive muscle relaxation, and visualization). Balance handout provided to take home. Written material given at graduation. ? ? ?Activity Barriers & Risk Stratification: ? Activity Barriers & Cardiac Risk Stratification - 11/15/21 1534   ? ?  ? Activity Barriers & Cardiac Risk Stratification  ? Activity Barriers Shortness of Breath;Back Problems;Arthritis   ? Cardiac Risk Stratification High   ? ?  ?  ? ?  ? ? ?6 Minute Walk: ? 6 Minute Walk   ? ? [place] Name 12/02/21 1650  ?  ?  ?  ? 6 Minute Walk  ? Phase Initial    ? Distance 1002 feet    ? Walk Time 6  minutes    ? # of Rest Breaks 0    ? MPH 1.9    ? METS 2.37    ? RPE 11    ? Perceived Dyspnea  3    ? VO2 Peak 8.3    ? Symptoms Yes (comment)    ? Comments SOB    ? Resting HR 86 bpm    ? Resting BP 118/64    ? R

## 2021-12-15 NOTE — Progress Notes (Signed)
Daily Session Note ? ?Patient Details  ?Name: Monica Oliver ?MRN: [id number] ?Date of Birth: 29-Apr-1951 ?Referring Provider:   ?Flowsheet Row Cardiac Rehab from 12/02/2021 in [facility] Cardiac and Pulmonary Rehab  ?Referring Provider End  ? ?  ? ? ?Encounter Date: 12/15/2021 ? ?Check In: ? Session Check In - 12/15/21 1727   ? ?  ? Check-In  ? Supervising physician immediately available to respond to emergencies See telemetry face sheet for immediately available ER MD   ? Location [facility]-Cardiac & Pulmonary Rehab   ? Staff Present Birdie Sons, MPA, RN;Joseph [place], RCP,RRT,BSRT;Kara [place], MS, ASCM CEP, Exercise Physiologist   ? Virtual Visit No   ? Medication changes reported     No   ? Fall or balance concerns reported    No   ? Tobacco Cessation No Change   ? Warm-up and Cool-down Performed on first and last piece of equipment   ? Resistance Training Performed Yes   ? VAD Patient? No   ? PAD/SET Patient? No   ?  ? Pain Assessment  ? Currently in Pain? No/denies   ? ?  ?  ? ?  ? ? ? ? ? ?Social History  ? ?Tobacco Use  ?Smoking Status Former  ? Packs/day: 0.75  ? Years: 40.00  ? Pack years: 30.00  ? Types: Cigarettes  ? Start date: 12/15/1971  ?Smokeless Tobacco Never  ?Tobacco Comments  ? 1 cig daily--01/26/2021  ? ? ?Goals Met:  ?Independence with exercise equipment ?Exercise tolerated well ?No report of concerns or symptoms today ?Strength training completed today ? ?Goals Unmet:  ?Not Applicable ? ?Comments: Pt able to follow exercise prescription today without complaint.  Will continue to monitor for progression. ? ? ? ?Dr. Emily Filbert is Medical Director for [place].  ?Dr. Ottie Glazier is Medical Director for [facility] Pulmonary Rehabilitation. ?

## 2021-12-16 ENCOUNTER — Encounter: Payer: BC Managed Care – PPO | Admitting: *Deleted

## 2021-12-16 DIAGNOSIS — Z955 Presence of coronary angioplasty implant and graft: Secondary | ICD-10-CM

## 2021-12-16 NOTE — Progress Notes (Signed)
Daily Session Note ? ?Patient Details  ?Name: Monica Oliver ?MRN: [id number] ?Date of Birth: [birth year] ?Referring Provider:   ?Flowsheet Row Cardiac Rehab from 12/02/2021 in [facility] Cardiac and Pulmonary Rehab  ?Referring Provider End  ? ?  ? ? ?Encounter Date: 12/16/2021 ? ?Check In: ? Session Check In - 12/16/21 1724   ? ?  ? Check-In  ? Supervising physician immediately available to respond to emergencies See telemetry face sheet for immediately available ER MD   ? Location [facility]-Cardiac & Pulmonary Rehab   ? Staff Present Renita Papa, RN BSN;Joseph [place], RCP,RRT,BSRT;Kara [place], [place], ASCM CEP, Exercise Physiologist   ? Virtual Visit No   ? Medication changes reported     No   ? Fall or balance concerns reported    No   ? Warm-up and Cool-down Performed on first and last piece of equipment   ? Resistance Training Performed Yes   ? VAD Patient? No   ? PAD/SET Patient? No   ?  ? Pain Assessment  ? Currently in Pain? No/denies   ? ?  ?  ? ?  ? ? ? ? ? ?Social History  ? ?Tobacco Use  ?Smoking Status Former  ? Packs/day: 0.75  ? Years: 40.00  ? Pack years: 30.00  ? Types: Cigarettes  ? Start date: 12/15/1971  ?Smokeless Tobacco Never  ?Tobacco Comments  ? 1 cig daily--01/26/2021  ? ? ?Goals Met:  ?Independence with exercise equipment ?Exercise tolerated well ?No report of concerns or symptoms today ?Strength training completed today ? ?Goals Unmet:  ?Not Applicable ? ?Comments: Pt able to follow exercise prescription today without complaint.  Will continue to monitor for progression. ? ? ? ?Dr. Emily Filbert is Medical Director for [place].  ?Dr. Ottie Glazier is Medical Director for [facility] Pulmonary Rehabilitation. ?

## 2021-12-20 ENCOUNTER — Other Ambulatory Visit: Payer: Self-pay

## 2021-12-20 ENCOUNTER — Encounter: Payer: BC Managed Care – PPO | Admitting: *Deleted

## 2021-12-20 DIAGNOSIS — Z955 Presence of coronary angioplasty implant and graft: Secondary | ICD-10-CM | POA: Diagnosis not present

## 2021-12-20 NOTE — Progress Notes (Signed)
Daily Session Note ? ?Patient Details  ?Name: Monica Oliver ?MRN: [id number] ?Date of Birth: 10/29/1950 ?Referring Provider:   ?Flowsheet Row Cardiac Rehab from 12/02/2021 in [facility] Cardiac and Pulmonary Rehab  ?Referring Provider End  ? ?  ? ? ?Encounter Date: 12/20/2021 ? ?Check In: ? Session Check In - 12/20/21 1717   ? ?  ? Check-In  ? Supervising physician immediately available to respond to emergencies See telemetry face sheet for immediately available ER MD   ? Location [facility]-Cardiac & Pulmonary Rehab   ? Staff Present Renita Papa, RN BSN;Joseph [place], RCP,RRT,BSRT;Kara [place], [place], ASCM CEP, Exercise Physiologist   ? Virtual Visit No   ? Medication changes reported     No   ? Fall or balance concerns reported    No   ? Warm-up and Cool-down Performed on first and last piece of equipment   ? Resistance Training Performed Yes   ? VAD Patient? No   ? PAD/SET Patient? No   ?  ? Pain Assessment  ? Currently in Pain? No/denies   ? ?  ?  ? ?  ? ? ? ? ? ?Social History  ? ?Tobacco Use  ?Smoking Status Former  ? Packs/day: 0.75  ? Years: 40.00  ? Pack years: 30.00  ? Types: Cigarettes  ? Start date: 12/15/1971  ?Smokeless Tobacco Never  ?Tobacco Comments  ? 1 cig daily--01/26/2021  ? ? ?Goals Met:  ?Independence with exercise equipment ?Exercise tolerated well ?No report of concerns or symptoms today ?Strength training completed today ? ?Goals Unmet:  ?Not Applicable ? ?Comments: Pt able to follow exercise prescription today without complaint.  Will continue to monitor for progression. ? ? ? ?Dr. Emily Filbert is Medical Director for [place].  ?Dr. Ottie Glazier is Medical Director for [facility] Pulmonary Rehabilitation. ?

## 2021-12-20 NOTE — Progress Notes (Signed)
Completed initial RD consultation ?

## 2021-12-21 ENCOUNTER — Other Ambulatory Visit: Payer: Self-pay | Admitting: Internal Medicine

## 2021-12-21 ENCOUNTER — Other Ambulatory Visit: Payer: Self-pay

## 2021-12-21 MED ORDER — AMLODIPINE BESYLATE 5 MG PO TABS
5.0000 mg | ORAL_TABLET | Freq: Every day | ORAL | 0 refills | Status: DC
  Filled 2021-12-21 (×2): qty 30, 30d supply, fill #0

## 2021-12-22 DIAGNOSIS — Z955 Presence of coronary angioplasty implant and graft: Secondary | ICD-10-CM

## 2021-12-22 NOTE — Progress Notes (Signed)
Daily Session Note ? ?Patient Details  ?Name: Monica Oliver ?MRN: [id number] ?Date of Birth: 25-Feb-1951 ?Referring Provider:   ?Flowsheet Row Cardiac Rehab from 12/02/2021 in [facility] Cardiac and Pulmonary Rehab  ?Referring Provider End  ? ?  ? ? ?Encounter Date: 12/22/2021 ? ?Check In: ? Session Check In - 12/22/21 1645   ? ?  ? Check-In  ? Supervising physician immediately available to respond to emergencies See telemetry face sheet for immediately available ER MD   ? Location [facility]-Cardiac & Pulmonary Rehab   ? Staff Present Birdie Sons, MPA, RN;Joseph [place]is, RCP,RRT,BSRT;Kara [place], MS, ASCM CEP, Exercise Physiologist   ? Virtual Visit No   ? Medication changes reported     No   ? Fall or balance concerns reported    No   ? Tobacco Cessation No Change   ? Warm-up and Cool-down Performed on first and last piece of equipment   ? Resistance Training Performed Yes   ? VAD Patient? No   ? PAD/SET Patient? No   ?  ? Pain Assessment  ? Currently in Pain? No/denies   ? ?  ?  ? ?  ? ? ? ? ? ?Social History  ? ?Tobacco Use  ?Smoking Status Former  ? Packs/day: 0.75  ? Years: 40.00  ? Pack years: 30.00  ? Types: Cigarettes  ? Start date: 12/15/1971  ?Smokeless Tobacco Never  ?Tobacco Comments  ? 1 cig daily--01/26/2021  ? ? ?Goals Met:  ?Independence with exercise equipment ?Exercise tolerated well ?No report of concerns or symptoms today ?Strength training completed today ? ?Goals Unmet:  ?Not Applicable ? ?Comments: Pt able to follow exercise prescription today without complaint.  Will continue to monitor for progression. ? ? ? ?Dr. Emily Filbert is Medical Director for [place].  ?Dr. Ottie Glazier is Medical Director for [facility] Pulmonary Rehabilitation. ?

## 2021-12-23 ENCOUNTER — Encounter: Payer: BC Managed Care – PPO | Admitting: *Deleted

## 2021-12-23 DIAGNOSIS — Z955 Presence of coronary angioplasty implant and graft: Secondary | ICD-10-CM

## 2021-12-23 NOTE — Progress Notes (Signed)
Daily Session Note ? ?Patient Details  ?Name: Monica Oliver ?MRN: [id number] ?Date of Birth: June 02, 1951 ?Referring Provider:   ?Flowsheet Row Cardiac Rehab from 12/02/2021 in [facility] Cardiac and Pulmonary Rehab  ?Referring Provider End  ? ?  ? ? ?Encounter Date: 12/23/2021 ? ?Check In: ? Session Check In - 12/23/21 1720   ? ?  ? Check-In  ? Supervising physician immediately available to respond to emergencies See telemetry face sheet for immediately available ER MD   ? Location [facility]-Cardiac & Pulmonary Rehab   ? Staff Present Renita Papa, RN BSN;Joseph [place], RCP,RRT,BSRT;Amanda Outlook, [place], ACSM CEP, Exercise Physiologist   ? Virtual Visit No   ? Medication changes reported     No   ? Fall or balance concerns reported    No   ? Warm-up and Cool-down Performed on first and last piece of equipment   ? Resistance Training Performed Yes   ? VAD Patient? No   ? PAD/SET Patient? No   ?  ? Pain Assessment  ? Currently in Pain? No/denies   ? ?  ?  ? ?  ? ? ? ? ? ?Social History  ? ?Tobacco Use  ?Smoking Status Former  ? Packs/day: 0.75  ? Years: 40.00  ? Pack years: 30.00  ? Types: Cigarettes  ? Start date: 12/15/1971  ?Smokeless Tobacco Never  ?Tobacco Comments  ? 1 cig daily--01/26/2021  ? ? ?Goals Met:  ?Independence with exercise equipment ?Exercise tolerated well ?No report of concerns or symptoms today ?Strength training completed today ? ?Goals Unmet:  ?Not Applicable ? ?Comments: Pt able to follow exercise prescription today without complaint.  Will continue to monitor for progression. ? ? ? ?Dr. Emily Filbert is Medical Director for [place].  ?Dr. Ottie Glazier is Medical Director for [facility] Pulmonary Rehabilitation. ?

## 2021-12-27 ENCOUNTER — Other Ambulatory Visit: Payer: Self-pay

## 2021-12-27 ENCOUNTER — Encounter: Payer: BC Managed Care – PPO | Attending: Internal Medicine | Admitting: *Deleted

## 2021-12-27 DIAGNOSIS — Z955 Presence of coronary angioplasty implant and graft: Secondary | ICD-10-CM | POA: Diagnosis not present

## 2021-12-27 DIAGNOSIS — Z48812 Encounter for surgical aftercare following surgery on the circulatory system: Secondary | ICD-10-CM | POA: Diagnosis not present

## 2021-12-27 NOTE — Progress Notes (Signed)
Daily Session Note ? ?Patient Details  ?Name: Monica Oliver ?MRN: [id number] ?Date of Birth: Sep 22, 1951 ?Referring Provider:   ?Flowsheet Row Cardiac Rehab from 12/02/2021 in [facility] Cardiac and Pulmonary Rehab  ?Referring Provider End  ? ?  ? ? ?Encounter Date: 12/27/2021 ? ?Check In: ? Session Check In - 12/27/21 1714   ? ?  ? Check-In  ? Supervising physician immediately available to respond to emergencies See telemetry face sheet for immediately available ER MD   ? Location [facility]-Cardiac & Pulmonary Rehab   ? Staff Present Renita Papa, RN BSN;Joseph [place], RCP,RRT,BSRT;Melissa [place], [place], LDN   ? Virtual Visit No   ? Medication changes reported     No   ? Fall or balance concerns reported    No   ? Warm-up and Cool-down Performed on first and last piece of equipment   ? Resistance Training Performed Yes   ? VAD Patient? No   ? PAD/SET Patient? No   ?  ? Pain Assessment  ? Currently in Pain? No/denies   ? ?  ?  ? ?  ? ? ? ? ? ?Social History  ? ?Tobacco Use  ?Smoking Status Former  ? Packs/day: 0.75  ? Years: 40.00  ? Pack years: 30.00  ? Types: Cigarettes  ? Start date: 12/15/1971  ?Smokeless Tobacco Never  ?Tobacco Comments  ? 1 cig daily--01/26/2021  ? ? ?Goals Met:  ?Independence with exercise equipment ?Exercise tolerated well ?No report of concerns or symptoms today ?Strength training completed today ? ?Goals Unmet:  ?Not Applicable ? ?Comments: Pt able to follow exercise prescription today without complaint.  Will continue to monitor for progression. ? ? ? ?Dr. Emily Filbert is Medical Director for [place].  ?Dr. Ottie Glazier is Medical Director for [facility] Pulmonary Rehabilitation. ?

## 2021-12-29 DIAGNOSIS — Z955 Presence of coronary angioplasty implant and graft: Secondary | ICD-10-CM

## 2021-12-29 DIAGNOSIS — Z48812 Encounter for surgical aftercare following surgery on the circulatory system: Secondary | ICD-10-CM | POA: Diagnosis not present

## 2021-12-29 NOTE — Progress Notes (Signed)
Daily Session Note ? ?Patient Details  ?Name: Monica Oliver ?MRN: [id number] ?Date of Birth: 14-Jun-1951 ?Referring Provider:   ?Flowsheet Row Cardiac Rehab from 12/02/2021 in [facility] Cardiac and Pulmonary Rehab  ?Referring Provider End  ? ?  ? ? ?Encounter Date: 12/29/2021 ? ?Check In: ? Session Check In - 12/29/21 1641   ? ?  ? Check-In  ? Supervising physician immediately available to respond to emergencies See telemetry face sheet for immediately available ER MD   ? Location [facility]-Cardiac & Pulmonary Rehab   ? Staff Present Birdie Sons, MPA, Nino Glow, MS, ASCM CEP, Exercise Physiologist;Melissa [place], RDN, LDN;Joseph [place], RCP,RRT,BSRT   ? Virtual Visit No   ? Medication changes reported     No   ? Fall or balance concerns reported    No   ? Tobacco Cessation No Change   ? Warm-up and Cool-down Performed on first and last piece of equipment   ? Resistance Training Performed Yes   ? VAD Patient? No   ? PAD/SET Patient? No   ?  ? Pain Assessment  ? Currently in Pain? No/denies   ? ?  ?  ? ?  ? ? ? ? ? ?Social History  ? ?Tobacco Use  ?Smoking Status Former  ? Packs/day: 0.75  ? Years: 40.00  ? Pack years: 30.00  ? Types: Cigarettes  ? Start date: 12/15/1971  ?Smokeless Tobacco Never  ?Tobacco Comments  ? 1 cig daily--01/26/2021  ? ? ?Goals Met:  ?Independence with exercise equipment ?Exercise tolerated well ?No report of concerns or symptoms today ?Strength training completed today ? ?Goals Unmet:  ?Not Applicable ? ?Comments: Pt able to follow exercise prescription today without complaint.  Will continue to monitor for progression. ? ? ? ?Dr. Emily Filbert is Medical Director for [place].  ?Dr. Ottie Glazier is Medical Director for [facility] Pulmonary Rehabilitation. ?

## 2021-12-30 ENCOUNTER — Encounter: Payer: BC Managed Care – PPO | Admitting: *Deleted

## 2021-12-30 DIAGNOSIS — Z955 Presence of coronary angioplasty implant and graft: Secondary | ICD-10-CM | POA: Diagnosis not present

## 2021-12-30 DIAGNOSIS — Z48812 Encounter for surgical aftercare following surgery on the circulatory system: Secondary | ICD-10-CM | POA: Diagnosis not present

## 2021-12-30 NOTE — Progress Notes (Signed)
Daily Session Note ? ?Patient Details  ?Name: Monica Oliver ?MRN: [id number] ?Date of Birth: 19-Apr-1951 ?Referring Provider:   ?Flowsheet Row Cardiac Rehab from 12/02/2021 in [facility] Cardiac and Pulmonary Rehab  ?Referring Provider End  ? ?  ? ? ?Encounter Date: 12/30/2021 ? ?Check In: ? Session Check In - 12/30/21 1710   ? ?  ? Check-In  ? Supervising physician immediately available to respond to emergencies See telemetry face sheet for immediately available ER MD   ? Location [facility]-Cardiac & Pulmonary Rehab   ? Staff Present Renita Papa, RN BSN;Joseph [place], RCP,RRT,BSRT;Kara [place], [place], ASCM CEP, Exercise Physiologist   ? Virtual Visit No   ? Medication changes reported     No   ? Fall or balance concerns reported    No   ? Warm-up and Cool-down Performed on first and last piece of equipment   ? Resistance Training Performed Yes   ? VAD Patient? No   ? PAD/SET Patient? No   ?  ? Pain Assessment  ? Currently in Pain? No/denies   ? ?  ?  ? ?  ? ? ? ? ? ?Social History  ? ?Tobacco Use  ?Smoking Status Former  ? Packs/day: 0.75  ? Years: 40.00  ? Pack years: 30.00  ? Types: Cigarettes  ? Start date: 12/15/1971  ?Smokeless Tobacco Never  ?Tobacco Comments  ? 1 cig daily--01/26/2021  ? ? ?Goals Met:  ?Independence with exercise equipment ?Exercise tolerated well ?No report of concerns or symptoms today ?Strength training completed today ? ?Goals Unmet:  ?Not Applicable ? ?Comments: Pt able to follow exercise prescription today without complaint.  Will continue to monitor for progression. ? ? ? ?Dr. Emily Filbert is Medical Director for [place].  ?Dr. Ottie Glazier is Medical Director for [facility] Pulmonary Rehabilitation. ?

## 2021-12-31 ENCOUNTER — Other Ambulatory Visit: Payer: Self-pay

## 2022-01-03 ENCOUNTER — Encounter: Payer: BC Managed Care – PPO | Admitting: *Deleted

## 2022-01-03 DIAGNOSIS — Z48812 Encounter for surgical aftercare following surgery on the circulatory system: Secondary | ICD-10-CM | POA: Diagnosis not present

## 2022-01-03 DIAGNOSIS — Z955 Presence of coronary angioplasty implant and graft: Secondary | ICD-10-CM | POA: Diagnosis not present

## 2022-01-03 NOTE — Progress Notes (Signed)
Daily Session Note ? ?Patient Details  ?Name: Monica Oliver ?MRN: [id number] ?Date of Birth: April 13, 1951 ?Referring Provider:   ?Flowsheet Row Cardiac Rehab from 12/02/2021 in [facility] Cardiac and Pulmonary Rehab  ?Referring Provider End  ? ?  ? ? ?Encounter Date: 01/03/2022 ? ?Check In: ? Session Check In - 01/03/22 1712   ? ?  ? Check-In  ? Supervising physician immediately available to respond to emergencies See telemetry face sheet for immediately available ER MD   ? Location [facility]-Cardiac & Pulmonary Rehab   ? Staff Present Renita Papa, RN BSN;Joseph [place], RCP,RRT,BSRT;Melissa [place], [place], LDN   ? Virtual Visit No   ? Medication changes reported     No   ? Fall or balance concerns reported    No   ? Warm-up and Cool-down Performed on first and last piece of equipment   ? Resistance Training Performed Yes   ? VAD Patient? No   ? PAD/SET Patient? No   ?  ? Pain Assessment  ? Currently in Pain? No/denies   ? ?  ?  ? ?  ? ? ? ? ? ?Social History  ? ?Tobacco Use  ?Smoking Status Former  ? Packs/day: 0.75  ? Years: 40.00  ? Pack years: 30.00  ? Types: Cigarettes  ? Start date: 12/15/1971  ?Smokeless Tobacco Never  ?Tobacco Comments  ? 1 cig daily--01/26/2021  ? ? ?Goals Met:  ?Independence with exercise equipment ?Exercise tolerated well ?No report of concerns or symptoms today ?Strength training completed today ? ?Goals Unmet:  ?Not Applicable ? ?Comments: Pt able to follow exercise prescription today without complaint.  Will continue to monitor for progression.' ? ? ?Dr. Emily Filbert is Medical Director for [place].  ?Dr. Ottie Glazier is Medical Director for [facility] Pulmonary Rehabilitation. ?

## 2022-01-06 ENCOUNTER — Other Ambulatory Visit: Payer: Self-pay

## 2022-01-06 DIAGNOSIS — Z7951 Long term (current) use of inhaled steroids: Secondary | ICD-10-CM | POA: Diagnosis not present

## 2022-01-06 DIAGNOSIS — D509 Iron deficiency anemia, unspecified: Secondary | ICD-10-CM | POA: Diagnosis not present

## 2022-01-06 DIAGNOSIS — Z0130 Encounter for examination of blood pressure without abnormal findings: Secondary | ICD-10-CM | POA: Diagnosis not present

## 2022-01-06 DIAGNOSIS — Z0000 Encounter for general adult medical examination without abnormal findings: Secondary | ICD-10-CM | POA: Diagnosis not present

## 2022-01-06 DIAGNOSIS — M8580 Other specified disorders of bone density and structure, unspecified site: Secondary | ICD-10-CM | POA: Diagnosis not present

## 2022-01-06 DIAGNOSIS — R7309 Other abnormal glucose: Secondary | ICD-10-CM | POA: Diagnosis not present

## 2022-01-06 MED ORDER — GABAPENTIN 300 MG PO CAPS
300.0000 mg | ORAL_CAPSULE | Freq: Three times a day (TID) | ORAL | 3 refills | Status: DC | PRN
  Filled 2022-01-06: qty 270, 90d supply, fill #0

## 2022-01-06 MED ORDER — UNIFINE PENTIPS 31G X 5 MM MISC: 3 refills | Status: DC

## 2022-01-06 MED ORDER — VICTOZA 18 MG/3ML ~~LOC~~ SOPN
PEN_INJECTOR | SUBCUTANEOUS | 2 refills | Status: DC
  Filled 2022-01-06: qty 3, 28d supply, fill #0
  Filled 2022-01-06: qty 9, 84d supply, fill #0

## 2022-01-07 ENCOUNTER — Other Ambulatory Visit: Payer: Self-pay

## 2022-01-10 ENCOUNTER — Encounter: Payer: BC Managed Care – PPO | Admitting: *Deleted

## 2022-01-10 DIAGNOSIS — G4733 Obstructive sleep apnea (adult) (pediatric): Secondary | ICD-10-CM | POA: Diagnosis not present

## 2022-01-10 DIAGNOSIS — Z48812 Encounter for surgical aftercare following surgery on the circulatory system: Secondary | ICD-10-CM | POA: Diagnosis not present

## 2022-01-10 DIAGNOSIS — Z955 Presence of coronary angioplasty implant and graft: Secondary | ICD-10-CM | POA: Diagnosis not present

## 2022-01-10 NOTE — Progress Notes (Signed)
Daily Session Note ? ?Patient Details  ?Name: Monica Oliver ?MRN: [id number] ?Date of Birth: 03-05-1951 ?Referring Provider:   ?Flowsheet Row Cardiac Rehab from 12/02/2021 in [facility] Cardiac and Pulmonary Rehab  ?Referring Provider End  ? ?  ? ? ?Encounter Date: 01/10/2022 ? ?Check In: ? Session Check In - 01/10/22 1755   ? ?  ? Check-In  ? Supervising physician immediately available to respond to emergencies See telemetry face sheet for immediately available ER MD   ? Location [facility]-Cardiac & Pulmonary Rehab   ? Staff Present Hope Budds, RDN, LDN;Daysean Tinkham Sherryll Burger, RN BSN;Joseph [place], RCP,RRT,BSRT   ? Virtual Visit No   ? Medication changes reported     No   ? Fall or balance concerns reported    No   ? Warm-up and Cool-down Performed on first and last piece of equipment   ? Resistance Training Performed Yes   ? VAD Patient? No   ? PAD/SET Patient? No   ?  ? Pain Assessment  ? Currently in Pain? No/denies   ? ?  ?  ? ?  ? ? ? ? ? ?Social History  ? ?Tobacco Use  ?Smoking Status Former  ? Packs/day: 0.75  ? Years: 40.00  ? Pack years: 30.00  ? Types: Cigarettes  ? Start date: 12/15/1971  ?Smokeless Tobacco Never  ?Tobacco Comments  ? 1 cig daily--01/26/2021  ? ? ?Goals Met:  ?Independence with exercise equipment ?Exercise tolerated well ?No report of concerns or symptoms today ?Strength training completed today ? ?Goals Unmet:  ?Not Applicable ? ?Comments: Pt able to follow exercise prescription today without complaint.  Will continue to monitor for progression. ? ? ? ?Dr. Emily Filbert is Medical Director for [place].  ?Dr. Ottie Glazier is Medical Director for [facility] Pulmonary Rehabilitation. ?

## 2022-01-10 NOTE — Progress Notes (Deleted)
Daily Session Note ? ?Patient Details  ?Name: Monica Oliver ?MRN: [id number] ?Date of Birth: 06-Jan-1951 ?Referring Provider:   ?Flowsheet Row Cardiac Rehab from 12/02/2021 in [facility] Cardiac and Pulmonary Rehab  ?Referring Provider End  ? ?  ? ? ?Encounter Date: 01/10/2022 ? ?Check In: ? Session Check In - 01/10/22 1755   ? ?  ? Check-In  ? Supervising physician immediately available to respond to emergencies See telemetry face sheet for immediately available ER MD   ? Location [facility]-Cardiac & Pulmonary Rehab   ? Staff Present Hope Budds, RDN, LDN;Aritha Huckeba Sherryll Burger, RN BSN;Joseph [place], RCP,RRT,BSRT   ? Virtual Visit No   ? Medication changes reported     No   ? Fall or balance concerns reported    No   ? Warm-up and Cool-down Performed on first and last piece of equipment   ? Resistance Training Performed Yes   ? VAD Patient? No   ? PAD/SET Patient? No   ?  ? Pain Assessment  ? Currently in Pain? No/denies   ? ?  ?  ? ?  ? ? ? ? ? ?Social History  ? ?Tobacco Use  ?Smoking Status Former  ? Packs/day: 0.75  ? Years: 40.00  ? Pack years: 30.00  ? Types: Cigarettes  ? Start date: 12/15/1971  ?Smokeless Tobacco Never  ?Tobacco Comments  ? 1 cig daily--01/26/2021  ? ? ?Goals Met:  ?Independence with exercise equipment ?Exercise tolerated well ?No report of concerns or symptoms today ?Strength training completed today ? ?Goals Unmet:  ?Not Applicable ? ?Comments: Pt able to follow exercise prescription today without complaint.  Will continue to monitor for progression. ? ? ? ?Dr. Emily Filbert is Medical Director for [place].  ?Dr. Ottie Glazier is Medical Director for [facility] Pulmonary Rehabilitation. ?

## 2022-01-11 ENCOUNTER — Other Ambulatory Visit: Payer: Self-pay

## 2022-01-11 MED ORDER — D3-50 1.25 MG (50000 UT) PO CAPS
50000.0000 [IU] | ORAL_CAPSULE | ORAL | 0 refills | Status: DC
  Filled 2022-01-11: qty 8, 56d supply, fill #0

## 2022-01-12 ENCOUNTER — Encounter: Payer: Self-pay | Admitting: *Deleted

## 2022-01-12 DIAGNOSIS — Z955 Presence of coronary angioplasty implant and graft: Secondary | ICD-10-CM

## 2022-01-12 DIAGNOSIS — Z48812 Encounter for surgical aftercare following surgery on the circulatory system: Secondary | ICD-10-CM | POA: Diagnosis not present

## 2022-01-12 NOTE — Progress Notes (Signed)
Cardiac Individual Treatment Plan ? ?Patient Details  ?Name: Monica Oliver ?MRN: [id number] ?Date of Birth: 28-Nov-1950 ?Referring Provider:   ?Flowsheet Row Cardiac Rehab from 12/02/2021 in [facility] Cardiac and Pulmonary Rehab  ?Referring Provider End  ? ?  ? ? ?Initial Encounter Date:  ?Flowsheet Row Cardiac Rehab from 12/02/2021 in [facility] Cardiac and Pulmonary Rehab  ?Date 12/02/21  ? ?  ? ? ?Visit Diagnosis: Status post coronary artery stent placement ? ?Patient's Home Medications on Admission: ? ?Current Outpatient Medications:  ?  albuterol (PROVENTIL) (2.5 MG/3ML) 0.083% nebulizer solution, TAKE 3 MLS (2.5 MG TOTAL) BY NEBULIZATION EVERY 6 HOURS AS NEEDED FOR WHEEZING OR SHORTNESS OF BREATH., Disp: 75 mL, Rfl: 5 ?  albuterol (VENTOLIN HFA) 108 (90 Base) MCG/ACT inhaler, INHALE 2 PUFFS BY MOUTH EVERY 4 HOURS AS NEEDED, Disp: 18 g, Rfl: 12 ?  amLODipine (NORVASC) 5 MG tablet, Take 1 tablet (5 mg total) by mouth daily., Disp: 30 tablet, Rfl: 0 ?  aspirin 81 MG tablet, Take 81 mg by mouth daily., Disp: , Rfl:  ?  budesonide-formoterol (SYMBICORT) 160-4.5 MCG/ACT inhaler, INHALE 2 PUFFS BY MOUTH TWO TIMES DAILY, Disp: 10.2 g, Rfl: 5 ?  cetirizine (ZYRTEC) 10 MG tablet, Take 10 mg by mouth daily as needed for allergies., Disp: , Rfl:  ?  Cholecalciferol (D3-50) 1.25 MG (50000 UT) capsule, Take 1 capsule by mouth once a week, Disp: 8 capsule, Rfl: 0 ?  cholecalciferol (VITAMIN D3) 25 MCG (1000 UNIT) tablet, Take 1,000 Units by mouth daily., Disp: , Rfl:  ?  clopidogrel (PLAVIX) 75 MG tablet, Take 1 tablet (75 mg total) by mouth daily with breakfast., Disp: 30 tablet, Rfl: 2 ?  ezetimibe (ZETIA) 10 MG tablet, Take 1 tablet (10 mg total) by mouth daily., Disp: 30 tablet, Rfl: 2 ?  ferrous sulfate 325 (65 FE) MG tablet, TAKE 1 TABLET BY MOUTH EVERY OTHER DAY FOR ANEMIA (SPANISH), Disp: 90 tablet, Rfl: 2 ?  fluticasone (FLONASE) 50 MCG/ACT nasal spray, Place 1 spray into both nostrils daily as needed for allergies or rhinitis.,  Disp: 16 g, Rfl: 12 ?  furosemide (LASIX) 20 MG tablet, Take 1 tablet (20 mg total) by mouth daily as needed (weight gain / edema)., Disp: 30 tablet, Rfl: 2 ?  gabapentin (NEURONTIN) 300 MG capsule, Take 300 mg by mouth 2 (two) times daily., Disp: , Rfl:  ?  gabapentin (NEURONTIN) 300 MG capsule, Take 1 capsule (300 mg total) by mouth 3 (three) times daily as needed for pain., Disp: 270 capsule, Rfl: 3 ?  glucose blood (FREESTYLE LITE) test strip, Test once a day as directed, Disp: 100 each, Rfl: 11 ?  hydrocortisone cream 1 %, Apply 1 application topically 2 (two) times daily as needed for itching., Disp: , Rfl:  ?  Insulin Pen Needle (UNIFINE PENTIPS) 31G X 5 MM MISC, Use pen needles with pen injections daily, dx E11.9, Disp: 100 each, Rfl: 3 ?  liraglutide (VICTOZA) 18 MG/3ML SOPN, Inject 0.6 mg subcutaneously once a day for 1 week then increase to 1.'2mg'$  daily for diabetes and heart health., Disp: 9 mL, Rfl: 2 ?  lisinopril (ZESTRIL) 10 MG tablet, Take 1 tablet (10 mg total) by mouth daily., Disp: 30 tablet, Rfl: 1 ?  metFORMIN (GLUCOPHAGE-XR) 500 MG 24 hr tablet, Take 2 tablets by mouth twice a day for diabetes, Disp: 120 tablet, Rfl: 10 ?  metoprolol tartrate (LOPRESSOR) 50 MG tablet, Take 1 tablet (50 mg total) by mouth 2 (two) times  daily., Disp: 60 tablet, Rfl: 1 ?  nitroGLYCERIN (NITROSTAT) 0.4 MG SL tablet, Place 1 tablet (0.4 mg total) under the tongue every 5 (five) minutes as needed for chest pain. Maximum of 3 doses., Disp: 25 tablet, Rfl: 3 ?  omeprazole (PRILOSEC) 20 MG capsule, TAKE 1 CAPSULE BY MOUTH ONCE A DAY AS NEEDED FOR HEARTBURN, Disp: 90 capsule, Rfl: 1 ?  rosuvastatin (CRESTOR) 5 MG tablet, Take 1 tablet (5 mg total) by mouth every Monday, Wednesday, and Friday., Disp: 12 tablet, Rfl: 3 ?  Tiotropium Bromide Monohydrate (SPIRIVA RESPIMAT) 1.25 MCG/ACT AERS, INHALE 2 PUFFS INTO THE LUNGS DAILY., Disp: 4 g, Rfl: 5 ?  tiZANidine (ZANAFLEX) 4 MG tablet, Take 1 tablet every 8 hours by oral  route., Disp: 30 tablet, Rfl: 0 ?  TRUE METRIX BLOOD GLUCOSE TEST test strip, , Disp: , Rfl: 99 ?  zinc gluconate 50 MG tablet, Take 50 mg by mouth daily., Disp: , Rfl:  ? ?Past Medical History: ?Past Medical History:  ?Diagnosis Date  ? Allergy   ? environmental  ? Arthritis   ? Asthma   ? CAD (coronary artery disease)   ? a. 08/2018 Cardiac CTA: CAC score 306 (91st %'ile). LM nl, LAD nl, LCX small w/ <50% mid stenosis. RI nl, RCA <50% mid, 51-69% distal (CTFFR of rPL 0.52); b. 12/2020 Cath: LM nl, LAD nl, D2 20, LCX nl, RCA 20p/m, RPAV 85. EF 55-65%-->Med Rx.  ? [facility])   ? a. 08/2019 ABI's nl.  ? COPD (chronic obstructive pulmonary disease) ([place])   ? Diabetes mellitus without complication ([place])   ? Diastolic dysfunction   ? a. 10/2018 Echo: EF 55-60%, Gr1 DD. Nl RV size/fxn.  ? GERD (gastroesophageal reflux disease)   ? Hyperlipidemia   ? Hypertension   ? Sleep apnea   ? C-Pap machine  ? Tobacco abuse   ? ? ?Tobacco Use: ?Social History  ? ?Tobacco Use  ?Smoking Status Former  ? Packs/day: 0.75  ? Years: 40.00  ? Pack years: 30.00  ? Types: Cigarettes  ? Start date: 12/15/1971  ?Smokeless Tobacco Never  ?Tobacco Comments  ? 1 cig daily--01/26/2021  ? ? ?Labs: ?Review Flowsheet   ? ?  ?  Latest Ref Rng & Units 02/12/2020 12/16/2020 09/24/2021 12/02/2021  ?Labs for [facility] Cardiac and Pulmonary Rehab  ?Cholestrol 100 - 199 mg/dL  161    123    ?LDL (calc) 0 - 99 mg/dL  63    51    ?HDL-C >39 mg/dL  55    45    ?Trlycerides 0 - 149 mg/dL  213    162    ?Hemoglobin A1c 4.8 - 5.6 % 5.9    6.6     ?  ? ? Multiple values from one day are sorted in reverse-chronological order  ?  ?  ? ? ? ?Exercise Target Goals: ?Exercise Program Goal: ?Individual exercise prescription set using results from initial 6 min walk test and THRR while considering  patient?s activity barriers and safety.  ? ?Exercise Prescription Goal: ?Initial exercise prescription builds to 30-45 minutes a day of aerobic activity, 2-3 days per week.  Home  exercise guidelines will be given to patient during program as part of exercise prescription that the participant will acknowledge. ? ? ?Education: Aerobic Exercise: ?- Group verbal and visual presentation on the components of exercise prescription. Introduces F.I.T.T principle from ACSM for exercise prescriptions.  Reviews F.I.T.T. principles of aerobic exercise including progression. Written material  given at graduation. ? ? ?Education: Resistance Exercise: ?- Group verbal and visual presentation on the components of exercise prescription. Introduces F.I.T.T principle from ACSM for exercise prescriptions  Reviews F.I.T.T. principles of resistance exercise including progression. Written material given at graduation. ? ?  ?Education: Exercise & Equipment Safety: ?- Individual verbal instruction and demonstration of equipment use and safety with use of the equipment. ?Flowsheet Row Cardiac Rehab from 12/29/2021 in [facility] Cardiac and Pulmonary Rehab  ?Date 12/02/21  ?Educator AS  ?Instruction Review Code 1- Verbalizes Understanding  ? ?  ? ? ?Education: Exercise Physiology & General Exercise Guidelines: ?- Group verbal and written instruction with models to review the exercise physiology of the cardiovascular system and associated critical values. Provides general exercise guidelines with specific guidelines to those with heart or lung disease.  ?Flowsheet Row Cardiac Rehab from 12/29/2021 in [facility] Cardiac and Pulmonary Rehab  ?Education need identified 12/02/21  ? ?  ? ? ?Education: Flexibility, Balance, Mind/Body Relaxation: ?- Group verbal and visual presentation with interactive activity on the components of exercise prescription. Introduces F.I.T.T principle from ACSM for exercise prescriptions. Reviews F.I.T.T. principles of flexibility and balance exercise training including progression. Also discusses the mind body connection.  Reviews various relaxation techniques to help reduce and manage stress (i.e. Deep  breathing, progressive muscle relaxation, and visualization). Balance handout provided to take home. Written material given at graduation. ? ? ?Activity Barriers & Risk Stratification: ? Activity Barriers & Cardiac Risk

## 2022-01-12 NOTE — Progress Notes (Signed)
Daily Session Note ? ?Patient Details  ?Name: Monica Oliver ?MRN: [id number] ?Date of Birth: May 18, 1951 ?Referring Provider:   ?Flowsheet Row Cardiac Rehab from 12/02/2021 in [facility] Cardiac and Pulmonary Rehab  ?Referring Provider End  ? ?  ? ? ?Encounter Date: 01/12/2022 ? ?Check In: ? Session Check In - 01/12/22 1659   ? ?  ? Check-In  ? Supervising physician immediately available to respond to emergencies See telemetry face sheet for immediately available ER MD   ? Location [facility]-Cardiac & Pulmonary Rehab   ? Staff Present Birdie Sons, MPA, RN;Melissa [place], RDN, Tawanna Solo, MS, ASCM CEP, Exercise Physiologist;Joseph [place], [place]   ? Virtual Visit No   ? Medication changes reported     No   ? Fall or balance concerns reported    No   ? Tobacco Cessation No Change   ? Warm-up and Cool-down Performed on first and last piece of equipment   ? Resistance Training Performed Yes   ? VAD Patient? No   ? PAD/SET Patient? No   ?  ? Pain Assessment  ? Currently in Pain? No/denies   ? ?  ?  ? ?  ? ? ? ? ? ?Social History  ? ?Tobacco Use  ?Smoking Status Former  ? Packs/day: 0.75  ? Years: 40.00  ? Pack years: 30.00  ? Types: Cigarettes  ? Start date: 12/15/1971  ?Smokeless Tobacco Never  ?Tobacco Comments  ? 1 cig daily--01/26/2021  ? ? ?Goals Met:  ?Independence with exercise equipment ?Exercise tolerated well ?No report of concerns or symptoms today ?Strength training completed today ? ?Goals Unmet:  ?Not Applicable ? ?Comments: Pt able to follow exercise prescription today without complaint.  Will continue to monitor for progression. ? ? ? ?Dr. Emily Filbert is Medical Director for [place].  ?Dr. Ottie Glazier is Medical Director for [facility] Pulmonary Rehabilitation. ?

## 2022-01-13 ENCOUNTER — Encounter: Payer: BC Managed Care – PPO | Admitting: *Deleted

## 2022-01-13 DIAGNOSIS — Z48812 Encounter for surgical aftercare following surgery on the circulatory system: Secondary | ICD-10-CM | POA: Diagnosis not present

## 2022-01-13 DIAGNOSIS — Z955 Presence of coronary angioplasty implant and graft: Secondary | ICD-10-CM

## 2022-01-13 NOTE — Progress Notes (Signed)
Daily Session Note ? ?Patient Details  ?Name: Monica Oliver ?MRN: [id number] ?Date of Birth: 01-01-1951 ?Referring Provider:   ?Flowsheet Row Cardiac Rehab from 12/02/2021 in [facility] Cardiac and Pulmonary Rehab  ?Referring Provider End  ? ?  ? ? ?Encounter Date: 01/13/2022 ? ?Check In: ? Session Check In - 01/13/22 1731   ? ?  ? Check-In  ? Supervising physician immediately available to respond to emergencies See telemetry face sheet for immediately available ER MD   ? Location [facility]-Cardiac & Pulmonary Rehab   ? Staff Present Renita Papa, RN BSN;Joseph [place], RCP,RRT,BSRT;Amanda [place], [place], ACSM CEP, Exercise Physiologist   ? Virtual Visit No   ? Medication changes reported     No   ? Fall or balance concerns reported    No   ? Warm-up and Cool-down Performed on first and last piece of equipment   ? Resistance Training Performed Yes   ? VAD Patient? No   ? PAD/SET Patient? No   ?  ? Pain Assessment  ? Currently in Pain? No/denies   ? ?  ?  ? ?  ? ? ? ? ? ?Social History  ? ?Tobacco Use  ?Smoking Status Former  ? Packs/day: 0.75  ? Years: 40.00  ? Pack years: 30.00  ? Types: Cigarettes  ? Start date: 12/15/1971  ?Smokeless Tobacco Never  ?Tobacco Comments  ? 1 cig daily--01/26/2021  ? ? ?Goals Met:  ?Independence with exercise equipment ?Exercise tolerated well ?No report of concerns or symptoms today ?Strength training completed today ? ?Goals Unmet:  ?Not Applicable ? ?Comments: Pt able to follow exercise prescription today without complaint.  Will continue to monitor for progression. ? ? ? ?Dr. Emily Filbert is Medical Director for [place].  ?Dr. Ottie Glazier is Medical Director for [facility] Pulmonary Rehabilitation. ?

## 2022-01-14 ENCOUNTER — Other Ambulatory Visit: Payer: Self-pay

## 2022-01-14 ENCOUNTER — Encounter: Payer: Self-pay | Admitting: Internal Medicine

## 2022-01-14 ENCOUNTER — Ambulatory Visit: Payer: BC Managed Care – PPO | Admitting: Internal Medicine

## 2022-01-14 VITALS — BP 120/66 | HR 86 | Ht 63.0 in | Wt 179.0 lb

## 2022-01-14 DIAGNOSIS — E1169 Type 2 diabetes mellitus with other specified complication: Secondary | ICD-10-CM

## 2022-01-14 DIAGNOSIS — E785 Hyperlipidemia, unspecified: Secondary | ICD-10-CM

## 2022-01-14 DIAGNOSIS — I25118 Atherosclerotic heart disease of native coronary artery with other forms of angina pectoris: Secondary | ICD-10-CM | POA: Diagnosis not present

## 2022-01-14 DIAGNOSIS — I10 Essential (primary) hypertension: Secondary | ICD-10-CM

## 2022-01-14 MED ORDER — EZETIMIBE 10 MG PO TABS
10.0000 mg | ORAL_TABLET | Freq: Every day | ORAL | 2 refills | Status: DC
  Filled 2022-01-14: qty 30, 30d supply, fill #0
  Filled 2022-02-22: qty 30, 30d supply, fill #1
  Filled 2022-03-24: qty 30, 30d supply, fill #2

## 2022-01-14 MED ORDER — CLOPIDOGREL BISULFATE 75 MG PO TABS
ORAL_TABLET | ORAL | 2 refills | Status: DC
  Filled 2022-01-14: qty 30, 30d supply, fill #0
  Filled 2022-02-22: qty 30, 30d supply, fill #1
  Filled 2022-03-24: qty 30, 30d supply, fill #2

## 2022-01-14 MED ORDER — METOPROLOL TARTRATE 50 MG PO TABS
50.0000 mg | ORAL_TABLET | Freq: Two times a day (BID) | ORAL | 2 refills | Status: DC
Start: 2022-01-14 — End: 2022-05-03
  Filled 2022-01-14 – 2022-01-31 (×2): qty 60, 30d supply, fill #0
  Filled 2022-03-01: qty 60, 30d supply, fill #1
  Filled 2022-04-03: qty 60, 30d supply, fill #2

## 2022-01-14 MED ORDER — LISINOPRIL 10 MG PO TABS
10.0000 mg | ORAL_TABLET | Freq: Every day | ORAL | 2 refills | Status: DC
  Filled 2022-01-14 – 2022-01-31 (×2): qty 30, 30d supply, fill #0
  Filled 2022-03-01: qty 30, 30d supply, fill #1
  Filled 2022-03-30: qty 30, 30d supply, fill #2

## 2022-01-14 MED ORDER — ROSUVASTATIN CALCIUM 5 MG PO TABS
5.0000 mg | ORAL_TABLET | ORAL | 2 refills | Status: DC
  Filled 2022-01-14 – 2022-01-31 (×2): qty 12, 28d supply, fill #0
  Filled 2022-03-01: qty 12, 28d supply, fill #1
  Filled 2022-03-24: qty 12, 28d supply, fill #2

## 2022-01-14 MED ORDER — AMLODIPINE BESYLATE 5 MG PO TABS
5.0000 mg | ORAL_TABLET | Freq: Every day | ORAL | 0 refills | Status: DC
  Filled 2022-01-14: qty 30, 30d supply, fill #0

## 2022-01-14 MED ORDER — FUROSEMIDE 20 MG PO TABS
20.0000 mg | ORAL_TABLET | Freq: Every day | ORAL | 2 refills | Status: DC | PRN
  Filled 2022-01-14: qty 30, 30d supply, fill #0

## 2022-01-14 MED ORDER — NYSTATIN 100000 UNIT/ML MT SUSP
OROMUCOSAL | 0 refills | Status: DC
  Filled 2022-01-14: qty 160, 10d supply, fill #0

## 2022-01-14 NOTE — Progress Notes (Signed)
? ?Follow-up Outpatient Visit ?Date: 01/14/2022 ? ?Primary Care Provider: ?Rejeana Brock, MD ?[address] ?[place] [address] ? ?Chief Complaint: Follow-up coronary disease with stable angina ? ?HPI:  Monica Oliver is a 71 y.o. female with history of single-vessel coronary artery disease involving RPAV status post unsuccessful PCI due to resistant lesion necessitating drug-eluting stent placement to the distal RCA for management of iatrogenic dissection, hypertension, hyperlipidemia, type 2 diabetes mellitus, iron deficiency anemia, COPD, obstructive sleep apnea, shingles, and tobacco abuse, who presents for follow-up of coronary artery disease.  I last saw her 6 weeks ago for follow-up of CAD.  She was feeling fairly well but endorsed mild chest tightness when she "rushes.  We increased metoprolol to tartrate to 50 mg twice daily and continued amlodipine 5 mg daily for antianginal therapy. ? ?Today, Monica Oliver reports that she is feeling well.  Chest pain/pressure noted at our last visit has resolved.  She denies shortness of breath, palpitations, lightheadedness, and edema.  She is tolerating her medications well. ? ?-------------------------------------------------------------------------------------------------- ? ?Cardiovascular History & Procedures: ?Cardiovascular Problems: ?Coronary artery disease ?  ?Risk Factors: ?Hypertension, hyperlipidemia, diabetes mellitus, obesity, tobacco use, and age greater than 45; coronary artery and aortic atherosclerotic calcification noted on noncontrast CT of the chest (07/16/2018 - personally reviewed) ?  ?Cath/PCI: ?LHC/PCI (09/24/2021): Severe single-vessel CAD with 90% stenosis of proximal RPA V, similar to prior catheterization in 12/2020.  Vigorous LV contraction with LVEDP 25-30 mmHg.  Unsuccessful PCI to RPA V due to resistance of the lesion and inability to cross.  Intervention complicated by guide extension dissection of distal RCA necessitating stenting with Onyx  Frontier 2.5 x 12 mm drug-eluting stent. ?LHC (12/29/2020): Severe single-vessel coronary artery disease with 80-90% stenosis of rPAV.  Mild, nonobstructive disease involving mid RCA and ostium of D2.  Normal LVEF with mildly elevated LVEDP. ?  ?CV Surgery: ?None ?  ?EP Procedures and Devices: ?None ?  ?Non-Invasive Evaluation(s): ?TTE (11/06/2018): Normal LV size with moderate LVH.  LVEF 55-60% with grade 1 diastolic dysfunction.  Mild mitral annular calcification.  Normal RV size and function.  Unable to assess RVSP. ?Cardiac CTA (09/10/18): CAC score 306 (91st percentile).  LMCA normal.  LAD normal.  LCx relatively small with <50% stenosis in mid vessel.  Small ramus normal.  RCA with <50% stenosis in mid vessel.  51-69% stenosis in distal RCA/rPL (CTFFR of rPL 0.52).  ?Right lower extremity venous duplex (04/22/2011): No evidence of DVT in the right lower extremity. ? ?Recent CV Pertinent Labs: ?Lab Results  ?Component Value Date  ? CHOL 123 12/02/2021  ? HDL 45 12/02/2021  ? LDLCALC 51 12/02/2021  ? TRIG 162 (H) 12/02/2021  ? CHOLHDL 2.7 12/02/2021  ? CHOLHDL 2.9 12/16/2020  ? INR 1.0 09/23/2021  ? BNP 18.0 09/23/2021  ? K 4.1 12/02/2021  ? K 3.6 03/15/2014  ? BUN 14 12/02/2021  ? BUN 11 03/15/2014  ? CREATININE 0.88 12/02/2021  ? CREATININE 1.01 03/15/2014  ? ? ?Past medical and surgical history were reviewed and updated in EPIC. ? ?Current Meds  ?Medication Sig  ? albuterol (PROVENTIL) (2.5 MG/3ML) 0.083% nebulizer solution TAKE 3 MLS (2.5 MG TOTAL) BY NEBULIZATION EVERY 6 HOURS AS NEEDED FOR WHEEZING OR SHORTNESS OF BREATH.  ? albuterol (VENTOLIN HFA) 108 (90 Base) MCG/ACT inhaler INHALE 2 PUFFS BY MOUTH EVERY 4 HOURS AS NEEDED  ? aspirin 81 MG tablet Take 81 mg by mouth daily.  ? budesonide-formoterol (SYMBICORT) 160-4.5 MCG/ACT inhaler INHALE 2  PUFFS BY MOUTH TWO TIMES DAILY  ? cetirizine (ZYRTEC) 10 MG tablet Take 10 mg by mouth daily as needed for allergies.  ? Cholecalciferol (D3-50) 1.25 MG (50000 UT)  capsule Take 1 capsule by mouth once a week  ? cholecalciferol (VITAMIN D3) 25 MCG (1000 UNIT) tablet Take 1,000 Units by mouth daily.  ? fluticasone (FLONASE) 50 MCG/ACT nasal spray Place 1 spray into both nostrils daily as needed for allergies or rhinitis.  ? gabapentin (NEURONTIN) 300 MG capsule Take 1 capsule (300 mg total) by mouth 3 (three) times daily as needed for pain.  ? glucose blood (FREESTYLE LITE) test strip Test once a day as directed  ? hydrocortisone cream 1 % Apply 1 application topically 2 (two) times daily as needed for itching.  ? Insulin Pen Needle (UNIFINE PENTIPS) 31G X 5 MM MISC Use pen needles with pen injections daily, dx E11.9  ? liraglutide (VICTOZA) 18 MG/3ML SOPN Inject 0.6 mg subcutaneously once a day for 1 week then increase to 1.'2mg'$  daily for diabetes and heart health.  ? nitroGLYCERIN (NITROSTAT) 0.4 MG SL tablet Place 1 tablet (0.4 mg total) under the tongue every 5 (five) minutes as needed for chest pain. Maximum of 3 doses.  ? omeprazole (PRILOSEC) 20 MG capsule TAKE 1 CAPSULE BY MOUTH ONCE A DAY AS NEEDED FOR HEARTBURN  ? Tiotropium Bromide Monohydrate (SPIRIVA RESPIMAT) 1.25 MCG/ACT AERS INHALE 2 PUFFS INTO THE LUNGS DAILY.  ? tiZANidine (ZANAFLEX) 4 MG tablet Take 1 tablet every 8 hours by oral route.  ? TRUE METRIX BLOOD GLUCOSE TEST test strip   ? zinc gluconate 50 MG tablet Take 50 mg by mouth daily.  ? [DISCONTINUED] amLODipine (NORVASC) 5 MG tablet Take 1 tablet (5 mg total) by mouth daily.  ? [DISCONTINUED] clopidogrel (PLAVIX) 75 MG tablet Take 1 tablet (75 mg total) by mouth daily with breakfast.  ? [DISCONTINUED] ezetimibe (ZETIA) 10 MG tablet Take 1 tablet (10 mg total) by mouth daily.  ? [DISCONTINUED] furosemide (LASIX) 20 MG tablet Take 1 tablet (20 mg total) by mouth daily as needed (weight gain / edema).  ? [DISCONTINUED] lisinopril (ZESTRIL) 10 MG tablet Take 1 tablet (10 mg total) by mouth daily.  ? [DISCONTINUED] metoprolol tartrate (LOPRESSOR) 50 MG tablet  Take 1 tablet (50 mg total) by mouth 2 (two) times daily.  ? [DISCONTINUED] rosuvastatin (CRESTOR) 5 MG tablet Take 1 tablet (5 mg total) by mouth every Monday, Wednesday, and Friday.  ? ? ?Allergies: Penicillins and Statins ? ?Social History  ? ?Tobacco Use  ? Smoking status: Former  ?  Packs/day: 0.75  ?  Years: 40.00  ?  Pack years: 30.00  ?  Types: Cigarettes  ?  Start date: 12/15/1971  ? Smokeless tobacco: Never  ? Tobacco comments:  ?  1 cig daily--01/26/2021  ?Vaping Use  ? Vaping Use: Never used  ?Substance Use Topics  ? Alcohol use: No  ?  Alcohol/week: 0.0 standard drinks  ? Drug use: No  ? ? ?Family History  ?Problem Relation Age of Onset  ? Hypertension Mother   ? Cancer Mother   ?     lung  ? Cancer Father   ?     prostate  ? Hyperlipidemia Father   ? Heart attack Father 36  ? Cancer Brother   ?     prostate  ? Cirrhosis Sister   ? Hepatitis Sister   ? Breast cancer Paternal Aunt   ? ? ?Review of Systems: ?A  12-system review of systems was performed and was negative except as noted in the HPI. ? ?-------------------------------------------------------------------------------------------------- ? ?Physical Exam: ?BP 120/66 (BP Location: Left Arm, Patient Position: Sitting, Cuff Size: Large)   Pulse 86   Ht '5\' 3"'$  (1.6 m)   Wt 179 lb (81.2 kg)   SpO2 98%   BMI 31.71 kg/m?  ? ?General:  NAD. ?Neck: No JVD or HJR. ?Lungs: Clear to auscultation bilaterally without wheezes or crackles. ?Heart: Regular rate and rhythm without murmurs, rubs, or gallops. ?Abdomen: Soft, nontender, nondistended. ?Extremities: No lower extremity edema. ? ?Lab Results  ?Component Value Date  ? WBC 8.5 12/02/2021  ? HGB 11.3 12/02/2021  ? HCT 35.8 12/02/2021  ? MCV 80 12/02/2021  ? PLT 370 12/02/2021  ? ? ?Lab Results  ?Component Value Date  ? NA 138 12/02/2021  ? K 4.1 12/02/2021  ? CL 97 12/02/2021  ? CO2 22 12/02/2021  ? BUN 14 12/02/2021  ? CREATININE 0.88 12/02/2021  ? GLUCOSE 114 (H) 12/02/2021  ? ALT 15 12/02/2021   ? ? ?Lab Results  ?Component Value Date  ? CHOL 123 12/02/2021  ? HDL 45 12/02/2021  ? LDLCALC 51 12/02/2021  ? TRIG 162 (H) 12/02/2021  ? CHOLHDL 2.7 12/02/2021  ? ? ?-----------------------------------------------

## 2022-01-14 NOTE — Patient Instructions (Signed)
Medication Instructions:  ?Your physician recommends that you continue on your current medications as directed. Please refer to the Current Medication list given to you today. ?   ?*If you need a refill on your cardiac medications before your next appointment, please call your pharmacy* ? ? ?Lab Work: ?None ordered ? ?If you have labs (blood work) drawn today and your tests are completely normal, you will receive your results only by: ?MyChart Message (if you have MyChart) OR ?A paper copy in the mail ?If you have any lab test that is abnormal or we need to change your treatment, we will call you to review the results. ? ? ?Testing/Procedures: ?None ordered ? ?Follow-Up: ?At [facility], you and your health needs are our priority.  As part of our continuing mission to provide you with exceptional heart care, we have created designated Provider Care Teams.  These Care Teams include your primary Cardiologist (physician) and Advanced Practice Providers (APPs -  Physician Assistants and Nurse Practitioners) who all work together to provide you with the care you need, when you need it. ? ?We recommend signing up for the patient portal called "MyChart".  Sign up information is provided on this After Visit Summary.  MyChart is used to connect with patients for Virtual Visits (Telemedicine).  Patients are able to view lab/test results, encounter notes, upcoming appointments, etc.  Non-urgent messages can be sent to your provider as well.   ?To learn more about what you can do with MyChart, go to [email or web address].   ? ?Your next appointment:   ?3 month(s) ? ?The format for your next appointment:   ?In Person ? ?Provider:   ?You may see [name], MD or one of the following Advanced Practice Providers on your designated Care Team:   ?Murray Hodgkins, NP ?Christell Faith, PA-C ?Cadence Kathlen Mody, PA-C{ ? ?Important Information About [place] ? ? ? ? ? ? ?

## 2022-01-15 ENCOUNTER — Encounter: Payer: Self-pay | Admitting: Internal Medicine

## 2022-01-17 ENCOUNTER — Other Ambulatory Visit: Payer: Self-pay

## 2022-01-17 ENCOUNTER — Encounter: Payer: BC Managed Care – PPO | Admitting: *Deleted

## 2022-01-17 DIAGNOSIS — Z955 Presence of coronary angioplasty implant and graft: Secondary | ICD-10-CM

## 2022-01-17 DIAGNOSIS — Z48812 Encounter for surgical aftercare following surgery on the circulatory system: Secondary | ICD-10-CM | POA: Diagnosis not present

## 2022-01-17 NOTE — Progress Notes (Signed)
Daily Session Note ? ?Patient Details  ?Name: Monica Oliver ?MRN: [id number] ?Date of Birth: September 06, 1951 ?Referring Provider:   ?Flowsheet Row Cardiac Rehab from 12/02/2021 in [facility] Cardiac and Pulmonary Rehab  ?Referring Provider End  ? ?  ? ? ?Encounter Date: 01/17/2022 ? ?Check In: ? Session Check In - 01/17/22 1748   ? ?  ? Check-In  ? Supervising physician immediately available to respond to emergencies See telemetry face sheet for immediately available ER MD   ? Location [facility]-Cardiac & Pulmonary Rehab   ? Staff Present Renita Papa, RN BSN;Joseph Wann, RCP,RRT,BSRT;Melissa [place]ce, [place], LDN   ? Virtual Visit No   ? Medication changes reported     No   ? Fall or balance concerns reported    No   ? Warm-up and Cool-down Performed on first and last piece of equipment   ? Resistance Training Performed Yes   ? VAD Patient? No   ? PAD/SET Patient? No   ?  ? Pain Assessment  ? Currently in Pain? No/denies   ? ?  ?  ? ?  ? ? ? ? ? ?Social History  ? ?Tobacco Use  ?Smoking Status Former  ? Packs/day: 0.75  ? Years: 40.00  ? Pack years: 30.00  ? Types: Cigarettes  ? Start date: 12/15/1971  ?Smokeless Tobacco Never  ?Tobacco Comments  ? 1 cig daily--01/26/2021  ? ? ?Goals Met:  ?Independence with exercise equipment ?Exercise tolerated well ?No report of concerns or symptoms today ?Strength training completed today ? ?Goals Unmet:  ?Not Applicable ? ?Comments: Pt able to follow exercise prescription today without complaint.  Will continue to monitor for progression. ? ? ? ?Dr. Emily Filbert is Medical Director for [place].  ?Dr. Ottie Glazier is Medical Director for [facility] Pulmonary Rehabilitation. ?

## 2022-01-19 DIAGNOSIS — Z48812 Encounter for surgical aftercare following surgery on the circulatory system: Secondary | ICD-10-CM | POA: Diagnosis not present

## 2022-01-19 DIAGNOSIS — Z955 Presence of coronary angioplasty implant and graft: Secondary | ICD-10-CM | POA: Diagnosis not present

## 2022-01-19 NOTE — Progress Notes (Signed)
Daily Session Note ? ?Patient Details  ?Name: Monica Oliver ?MRN: [id number] ?Date of Birth: 1951/03/12 ?Referring Provider:   ?Flowsheet Row Cardiac Rehab from 12/02/2021 in [facility] Cardiac and Pulmonary Rehab  ?Referring Provider End  ? ?  ? ? ?Encounter Date: 01/19/2022 ? ?Check In: ? Session Check In - 01/19/22 1642   ? ?  ? Check-In  ? Supervising physician immediately available to respond to emergencies See telemetry face sheet for immediately available ER MD   ? Location [facility]-Cardiac & Pulmonary Rehab   ? Staff Present Birdie Sons, MPA, RN;Joseph [place], RCP,RRT,BSRT;Melissa [place], RDN, LDN   ? Virtual Visit No   ? Medication changes reported     No   ? Fall or balance concerns reported    No   ? Tobacco Cessation No Change   ? Warm-up and Cool-down Performed on first and last piece of equipment   ? Resistance Training Performed Yes   ? VAD Patient? No   ? PAD/SET Patient? No   ?  ? Pain Assessment  ? Currently in Pain? No/denies   ? ?  ?  ? ?  ? ? ? ? ? ?Social History  ? ?Tobacco Use  ?Smoking Status Former  ? Packs/day: 0.75  ? Years: 40.00  ? Pack years: 30.00  ? Types: Cigarettes  ? Start date: 12/15/1971  ?Smokeless Tobacco Never  ?Tobacco Comments  ? 1 cig daily--01/26/2021  ? ? ?Goals Met:  ?Independence with exercise equipment ?Exercise tolerated well ?No report of concerns or symptoms today ?Strength training completed today ? ?Goals Unmet:  ?Not Applicable ? ?Comments: Pt able to follow exercise prescription today without complaint.  Will continue to monitor for progression. ? ? ? ?Dr. Emily Filbert is Medical Director for [place].  ?Dr. Ottie Glazier is Medical Director for [facility] Pulmonary Rehabilitation. ?

## 2022-01-20 DIAGNOSIS — Z955 Presence of coronary angioplasty implant and graft: Secondary | ICD-10-CM

## 2022-01-20 DIAGNOSIS — Z48812 Encounter for surgical aftercare following surgery on the circulatory system: Secondary | ICD-10-CM | POA: Diagnosis not present

## 2022-01-20 NOTE — Progress Notes (Signed)
Daily Session Note ? ?Patient Details  ?Name: Monica Oliver ?MRN: [id number] ?Date of Birth: 1951-06-30 ?Referring Provider:   ?Flowsheet Row Cardiac Rehab from 12/02/2021 in [facility] Cardiac and Pulmonary Rehab  ?Referring Provider End  ? ?  ? ? ?Encounter Date: 01/20/2022 ? ?Check In: ? Session Check In - 01/20/22 1727   ? ?  ? Check-In  ? Supervising physician immediately available to respond to emergencies See telemetry face sheet for immediately available ER MD   ? Location [facility]-Cardiac & Pulmonary Rehab   ? Staff Present Coralie Keens, MS, ASCM CEP, Exercise Physiologist;Hassaan Crite, RN, BSN;Joseph [place], RCP,RRT,BSRT   ? Virtual Visit No   ? Medication changes reported     No   ? Fall or balance concerns reported    No   ? Tobacco Cessation No Change   ? Warm-up and Cool-down Performed on first and last piece of equipment   ? Resistance Training Performed Yes   ? PAD/SET Patient? No   ?  ? Pain Assessment  ? Currently in Pain? No/denies   ? ?  ?  ? ?  ? ? ? ? ? ?Social History  ? ?Tobacco Use  ?Smoking Status Former  ? Packs/day: 0.75  ? Years: 40.00  ? Pack years: 30.00  ? Types: Cigarettes  ? Start date: 12/15/1971  ?Smokeless Tobacco Never  ?Tobacco Comments  ? 1 cig daily--01/26/2021  ? ? ?Goals Met:  ?Independence with exercise equipment ?Exercise tolerated well ?No report of concerns or symptoms today ?Strength training completed today ? ?Goals Unmet:  ?Not Applicable ? ?Comments: Pt able to follow exercise prescription today without complaint.  Will continue to monitor for progression. ? ? ?Dr. Emily Filbert is Medical Director for [place].  ?Dr. Ottie Glazier is Medical Director for [facility] Pulmonary Rehabilitation. ?

## 2022-01-24 ENCOUNTER — Encounter: Payer: BC Managed Care – PPO | Attending: Internal Medicine

## 2022-01-24 DIAGNOSIS — Z955 Presence of coronary angioplasty implant and graft: Secondary | ICD-10-CM | POA: Diagnosis not present

## 2022-01-24 NOTE — Progress Notes (Signed)
Daily Session Note ? ?Patient Details  ?Name: Monica Oliver ?MRN: [id number] ?Date of Birth: 1950-10-14 ?Referring Provider:   ?Flowsheet Row Cardiac Rehab from 12/02/2021 in [facility] Cardiac and Pulmonary Rehab  ?Referring Provider End  ? ?  ? ? ?Encounter Date: 01/24/2022 ? ?Check In: ? Session Check In - 01/24/22 1701   ? ?  ? Check-In  ? Supervising physician immediately available to respond to emergencies See telemetry face sheet for immediately available ER MD   ? Location [facility]-Cardiac & Pulmonary Rehab   ? Staff Present Birdie Sons, MPA, RN;Joseph [place], RCP,RRT,BSRT;Melissa [place], RDN, LDN   ? Virtual Visit No   ? Medication changes reported     No   ? Fall or balance concerns reported    No   ? Tobacco Cessation No Change   ? Warm-up and Cool-down Performed on first and last piece of equipment   ? Resistance Training Performed Yes   ? VAD Patient? No   ? PAD/SET Patient? No   ?  ? Pain Assessment  ? Currently in Pain? No/denies   ? ?  ?  ? ?  ? ? ? ? ? ?Social History  ? ?Tobacco Use  ?Smoking Status Former  ? Packs/day: 0.75  ? Years: 40.00  ? Pack years: 30.00  ? Types: Cigarettes  ? Start date: 12/15/1971  ?Smokeless Tobacco Never  ?Tobacco Comments  ? 1 cig daily--01/26/2021  ? ? ?Goals Met:  ?Independence with exercise equipment ?Exercise tolerated well ?No report of concerns or symptoms today ?Strength training completed today ? ?Goals Unmet:  ?Not Applicable ? ?Comments: Pt able to follow exercise prescription today without complaint.  Will continue to monitor for progression. ? ? ? ?Dr. Emily Filbert is Medical Director for [place].  ?Dr. Ottie Glazier is Medical Director for [facility] Pulmonary Rehabilitation. ?

## 2022-01-31 ENCOUNTER — Other Ambulatory Visit: Payer: Self-pay

## 2022-01-31 ENCOUNTER — Encounter: Payer: BC Managed Care – PPO | Admitting: *Deleted

## 2022-01-31 DIAGNOSIS — Z955 Presence of coronary angioplasty implant and graft: Secondary | ICD-10-CM | POA: Diagnosis not present

## 2022-01-31 NOTE — Progress Notes (Signed)
Daily Session Note ? ?Patient Details  ?Name: Monica Oliver ?MRN: [id number] ?Date of Birth: 06-16-1951 ?Referring Provider:   ?Flowsheet Row Cardiac Rehab from 12/02/2021 in [facility] Cardiac and Pulmonary Rehab  ?Referring Provider End  ? ?  ? ? ?Encounter Date: 01/31/2022 ? ?Check In: ? Session Check In - 01/31/22 1738   ? ?  ? Check-In  ? Supervising physician immediately available to respond to emergencies See telemetry face sheet for immediately available ER MD   ? Location [facility]-Cardiac & Pulmonary Rehab   ? Staff Present Heath Lark, RN, BSN, CCRP;Kelly Bollinger, MPA, RN;Melissa [place], RDN, LDN   ? Virtual Visit No   ? Medication changes reported     No   ? Fall or balance concerns reported    No   ? Warm-up and Cool-down Performed on first and last piece of equipment   ? Resistance Training Performed Yes   ? VAD Patient? No   ? PAD/SET Patient? No   ?  ? Pain Assessment  ? Currently in Pain? No/denies   ? ?  ?  ? ?  ? ? ? ? ? ?Social History  ? ?Tobacco Use  ?Smoking Status Former  ? Packs/day: 0.75  ? Years: 40.00  ? Pack years: 30.00  ? Types: Cigarettes  ? Start date: 12/15/1971  ?Smokeless Tobacco Never  ?Tobacco Comments  ? 1 cig daily--01/26/2021  ? ? ?Goals Met:  ?Independence with exercise equipment ?Exercise tolerated well ?No report of concerns or symptoms today ? ?Goals Unmet:  ?Not Applicable ? ?Comments: Pt able to follow exercise prescription today without complaint.  Will continue to monitor for progression. ? ? ? ?Dr. Emily Filbert is Medical Director for [place].  ?Dr. Ottie Glazier is Medical Director for [facility] Pulmonary Rehabilitation. ?

## 2022-02-01 ENCOUNTER — Other Ambulatory Visit: Payer: Self-pay

## 2022-02-01 MED ORDER — OMEPRAZOLE 20 MG PO CPDR
DELAYED_RELEASE_CAPSULE | ORAL | 2 refills | Status: DC
  Filled 2022-02-01 – 2022-04-19 (×2): qty 90, 90d supply, fill #0
  Filled 2022-11-16: qty 90, 90d supply, fill #1

## 2022-02-02 ENCOUNTER — Other Ambulatory Visit: Payer: Self-pay

## 2022-02-02 DIAGNOSIS — Z955 Presence of coronary angioplasty implant and graft: Secondary | ICD-10-CM

## 2022-02-02 NOTE — Progress Notes (Signed)
Daily Session Note ? ?Patient Details  ?Name: Monica Oliver ?MRN: [id number] ?Date of Birth: [birth year] ?Referring Provider:   ?Flowsheet Row Cardiac Rehab from 12/02/2021 in [facility] Cardiac and Pulmonary Rehab  ?Referring Provider End  ? ?  ? ? ?Encounter Date: 02/02/2022 ? ?Check In: ? Session Check In - 02/02/22 1724   ? ?  ? Check-In  ? Supervising physician immediately available to respond to emergencies See telemetry face sheet for immediately available ER MD   ? Location [facility]-Cardiac & Pulmonary Rehab   ? Staff Present Birdie Sons, MPA, RN;Melissa [place], RDN, LDN;Joseph Dover, RCP,RRT,BSRT   ? Virtual Visit No   ? Medication changes reported     No   ? Fall or balance concerns reported    No   ? Tobacco Cessation No Change   ? Warm-up and Cool-down Performed on first and last piece of equipment   ? Resistance Training Performed Yes   ? VAD Patient? No   ? PAD/SET Patient? No   ?  ? Pain Assessment  ? Currently in Pain? No/denies   ? ?  ?  ? ?  ? ? ? ? ? ?Social History  ? ?Tobacco Use  ?Smoking Status Former  ? Packs/day: 0.75  ? Years: 40.00  ? Pack years: 30.00  ? Types: Cigarettes  ? Start date: 12/15/1971  ?Smokeless Tobacco Never  ?Tobacco Comments  ? 1 cig daily--01/26/2021  ? ? ?Goals Met:  ?Independence with exercise equipment ?Exercise tolerated well ?No report of concerns or symptoms today ?Strength training completed today ? ?Goals Unmet:  ?Not Applicable ? ?Comments: Pt able to follow exercise prescription today without complaint.  Will continue to monitor for progression. ? ? ? ?Dr. Emily Filbert is Medical Director for [place].  ?Dr. Ottie Glazier is Medical Director for [facility] Pulmonary Rehabilitation. ?

## 2022-02-03 DIAGNOSIS — Z955 Presence of coronary angioplasty implant and graft: Secondary | ICD-10-CM

## 2022-02-03 NOTE — Progress Notes (Signed)
Daily Session Note ? ?Patient Details  ?Name: Monica Oliver ?MRN: [id number] ?Date of Birth: 09/23/1951 ?Referring Provider:   ?Flowsheet Row Cardiac Rehab from 12/02/2021 in [facility] Cardiac and Pulmonary Rehab  ?Referring Provider End  ? ?  ? ? ?Encounter Date: 02/03/2022 ? ?Check In: ? Session Check In - 02/03/22 1739   ? ?  ? Check-In  ? Supervising physician immediately available to respond to emergencies See telemetry face sheet for immediately available ER MD   ? Location [facility]-Cardiac & Pulmonary Rehab   ? Staff Present Kara Langdon, MS, ASCM CEP, Exercise Physiologist;Leslie Castrejon, RN, BSN;Joseph Hood, RCP,RRT,BSRT   ? Virtual Visit No   ? Medication changes reported     No   ? Fall or balance concerns reported    No   ? Tobacco Cessation No Change   ? Warm-up and Cool-down Performed on first and last piece of equipment   ? Resistance Training Performed Yes   ? VAD Patient? No   ? PAD/SET Patient? No   ?  ? Pain Assessment  ? Currently in Pain? No/denies   ? ?  ?  ? ?  ? ? ? ? ? ?Social History  ? ?Tobacco Use  ?Smoking Status Former  ? Packs/day: 0.75  ? Years: 40.00  ? Pack years: 30.00  ? Types: Cigarettes  ? Start date: 12/15/1971  ?Smokeless Tobacco Never  ?Tobacco Comments  ? 1 cig daily--01/26/2021  ? ? ?Goals Met:  ?Independence with exercise equipment ?Exercise tolerated well ?No report of concerns or symptoms today ?Strength training completed today ? ?Goals Unmet:  ?Not Applicable ? ?Comments: Pt able to follow exercise prescription today without complaint.  Will continue to monitor for progression. ? ? ?Dr. Mark Miller is Medical Director for [facility] Cardiac [facility].  ?Dr. Fuad Aleskerov is Medical Director for [facility] Pulmonary Rehabilitation. ?

## 2022-02-07 DIAGNOSIS — Z955 Presence of coronary angioplasty implant and graft: Secondary | ICD-10-CM | POA: Diagnosis not present

## 2022-02-07 NOTE — Progress Notes (Signed)
Daily Session Note ? ?Patient Details  ?Name: Monica Oliver ?MRN: [id number] ?Date of Birth: 1951/09/06 ?Referring Provider:   ?Flowsheet Row Cardiac Rehab from 12/02/2021 in [facility] Cardiac and Pulmonary Rehab  ?Referring Provider End  ? ?  ? ? ?Encounter Date: 02/07/2022 ? ?Check In: ? Session Check In - 02/07/22 1731   ? ?  ? Check-In  ? Supervising physician immediately available to respond to emergencies See telemetry face sheet for immediately available ER MD   ? Location [facility]-Cardiac & Pulmonary Rehab   ? Staff Present Birdie Sons, MPA, RN;Melissa [place], RDN, LDN;Joseph Benton, RCP,RRT,BSRT   ? Virtual Visit No   ? Medication changes reported     No   ? Fall or balance concerns reported    No   ? Tobacco Cessation No Change   ? Warm-up and Cool-down Performed on first and last piece of equipment   ? Resistance Training Performed Yes   ? VAD Patient? No   ? PAD/SET Patient? No   ?  ? Pain Assessment  ? Currently in Pain? No/denies   ? ?  ?  ? ?  ? ? ? ? ? ?Social History  ? ?Tobacco Use  ?Smoking Status Former  ? Packs/day: 0.75  ? Years: 40.00  ? Pack years: 30.00  ? Types: Cigarettes  ? Start date: 12/15/1971  ?Smokeless Tobacco Never  ?Tobacco Comments  ? 1 cig daily--01/26/2021  ? ? ?Goals Met:  ?Independence with exercise equipment ?Exercise tolerated well ?Personal goals reviewed ?No report of concerns or symptoms today ?Strength training completed today ? ?Goals Unmet:  ?Not Applicable ? ?Comments: Pt able to follow exercise prescription today without complaint.  Will continue to monitor for progression. ? ? ? ?Dr. Emily Filbert is Medical Director for [place].  ?Dr. Ottie Glazier is Medical Director for [facility] Pulmonary Rehabilitation. ?

## 2022-02-09 ENCOUNTER — Encounter: Payer: Self-pay | Admitting: *Deleted

## 2022-02-09 DIAGNOSIS — Z955 Presence of coronary angioplasty implant and graft: Secondary | ICD-10-CM | POA: Diagnosis not present

## 2022-02-09 NOTE — Progress Notes (Signed)
Daily Session Note ? ?Patient Details  ?Name: Monica Oliver ?MRN: [id number] ?Date of Birth: 1950/11/29 ?Referring Provider:   ?Flowsheet Row Cardiac Rehab from 12/02/2021 in [facility] Cardiac and Pulmonary Rehab  ?Referring Provider End  ? ?  ? ? ?Encounter Date: 02/09/2022 ? ?Check In: ? Session Check In - 02/09/22 1712   ? ?  ? Check-In  ? Supervising physician immediately available to respond to emergencies See telemetry face sheet for immediately available ER MD   ? Location [facility]-Cardiac & Pulmonary Rehab   ? Staff Present Birdie Sons, MPA, RN;Melissa [place], RDN, LDN;Joseph Richmond, RCP,RRT,BSRT   ? Virtual Visit No   ? Medication changes reported     No   ? Fall or balance concerns reported    No   ? Tobacco Cessation No Change   ? Warm-up and Cool-down Performed on first and last piece of equipment   ? Resistance Training Performed Yes   ? VAD Patient? No   ? PAD/SET Patient? No   ?  ? Pain Assessment  ? Currently in Pain? No/denies   ? ?  ?  ? ?  ? ? ? ? ? ?Social History  ? ?Tobacco Use  ?Smoking Status Former  ? Packs/day: 0.75  ? Years: 40.00  ? Pack years: 30.00  ? Types: Cigarettes  ? Start date: 12/15/1971  ?Smokeless Tobacco Never  ?Tobacco Comments  ? 1 cig daily--01/26/2021  ? ? ?Goals Met:  ?Independence with exercise equipment ?Exercise tolerated well ?No report of concerns or symptoms today ?Strength training completed today ? ?Goals Unmet:  ?Not Applicable ? ?Comments: Pt able to follow exercise prescription today without complaint.  Will continue to monitor for progression. ? ? 6 Minute Walk   ? ? [place] Name 12/02/21 1650 02/09/22 1734  ?  ?  ? 6 Minute Walk  ? Phase Initial Discharge   ? Distance 1002 feet 930 feet   ? Distance % Change -- -10.7 %   ? Distance Feet Change -- -72 ft   ? Walk Time 6 minutes 6 minutes   ? # of Rest Breaks 0 2   ? MPH 1.9 1.76   ? METS 2.37 1.8   ? RPE 11 13   ? Perceived Dyspnea  3 2   ? VO2 Peak 8.3 6.31   ? Symptoms Yes (comment) Yes (comment)   ? Comments SOB Lower back  was hurting. Stopped twice to stretch.   ? Resting HR 86 bpm 88 bpm   ? Resting BP 118/64 122/76   ? Resting Oxygen Saturation  97 % 96 %   ? Exercise Oxygen Saturation  during 6 min walk 97 % 96 %   ? Max Ex. HR 108 bpm 112 bpm   ? Max Ex. BP 152/64 144/64   ? 2 Minute Post BP 118/62 120/74   ? ?  ?  ? ?  ? ? ? ? ? ?Dr. Emily Filbert is Medical Director for [place].  ?Dr. Ottie Glazier is Medical Director for [facility] Pulmonary Rehabilitation. ?

## 2022-02-09 NOTE — Progress Notes (Signed)
Cardiac Individual Treatment Plan ? ?Patient Details  ?Name: Monica Oliver ?MRN: [id number] ?Date of Birth: Feb 27, 1951 ?Referring Provider:   ?Flowsheet Row Cardiac Rehab from 12/02/2021 in [facility] Cardiac and Pulmonary Rehab  ?Referring Provider End  ? ?  ? ? ?Initial Encounter Date:  ?Flowsheet Row Cardiac Rehab from 12/02/2021 in [facility] Cardiac and Pulmonary Rehab  ?Date 12/02/21  ? ?  ? ? ?Visit Diagnosis: Status post coronary artery stent placement ? ?Patient's Home Medications on Admission: ? ?Current Outpatient Medications:  ?  albuterol (PROVENTIL) (2.5 MG/3ML) 0.083% nebulizer solution, TAKE 3 MLS (2.5 MG TOTAL) BY NEBULIZATION EVERY 6 HOURS AS NEEDED FOR WHEEZING OR SHORTNESS OF BREATH., Disp: 75 mL, Rfl: 5 ?  albuterol (VENTOLIN HFA) 108 (90 Base) MCG/ACT inhaler, INHALE 2 PUFFS BY MOUTH EVERY 4 HOURS AS NEEDED, Disp: 18 g, Rfl: 12 ?  amLODipine (NORVASC) 5 MG tablet, Take 1 tablet (5 mg total) by mouth daily., Disp: 30 tablet, Rfl: 0 ?  aspirin 81 MG tablet, Take 81 mg by mouth daily., Disp: , Rfl:  ?  budesonide-formoterol (SYMBICORT) 160-4.5 MCG/ACT inhaler, INHALE 2 PUFFS BY MOUTH TWO TIMES DAILY, Disp: 10.2 g, Rfl: 5 ?  cetirizine (ZYRTEC) 10 MG tablet, Take 10 mg by mouth daily as needed for allergies., Disp: , Rfl:  ?  Cholecalciferol (D3-50) 1.25 MG (50000 UT) capsule, Take 1 capsule by mouth once a week, Disp: 8 capsule, Rfl: 0 ?  cholecalciferol (VITAMIN D3) 25 MCG (1000 UNIT) tablet, Take 1,000 Units by mouth daily., Disp: , Rfl:  ?  clopidogrel (PLAVIX) 75 MG tablet, Take 1 tablet (75 mg total) by mouth daily with breakfast., Disp: 30 tablet, Rfl: 2 ?  ezetimibe (ZETIA) 10 MG tablet, Take 1 tablet (10 mg total) by mouth daily., Disp: 30 tablet, Rfl: 2 ?  ferrous sulfate 325 (65 FE) MG tablet, TAKE 1 TABLET BY MOUTH EVERY OTHER DAY FOR ANEMIA (SPANISH), Disp: 90 tablet, Rfl: 2 ?  fluticasone (FLONASE) 50 MCG/ACT nasal spray, Place 1 spray into both nostrils daily as needed for allergies or rhinitis.,  Disp: 16 g, Rfl: 12 ?  furosemide (LASIX) 20 MG tablet, Take 1 tablet (20 mg total) by mouth daily as needed (weight gain / edema)., Disp: 30 tablet, Rfl: 2 ?  gabapentin (NEURONTIN) 300 MG capsule, Take 1 capsule (300 mg total) by mouth 3 (three) times daily as needed for pain., Disp: 270 capsule, Rfl: 3 ?  glucose blood (FREESTYLE LITE) test strip, Test once a day as directed, Disp: 100 each, Rfl: 11 ?  hydrocortisone cream 1 %, Apply 1 application topically 2 (two) times daily as needed for itching., Disp: , Rfl:  ?  Insulin Pen Needle (UNIFINE PENTIPS) 31G X 5 MM MISC, Use pen needles with pen injections daily, dx E11.9, Disp: 100 each, Rfl: 3 ?  liraglutide (VICTOZA) 18 MG/3ML SOPN, Inject 0.6 mg subcutaneously once a day for 1 week then increase to 1.'2mg'$  daily for diabetes and heart health., Disp: 9 mL, Rfl: 2 ?  lisinopril (ZESTRIL) 10 MG tablet, Take 1 tablet (10 mg total) by mouth daily., Disp: 30 tablet, Rfl: 2 ?  metoprolol tartrate (LOPRESSOR) 50 MG tablet, Take 1 tablet (50 mg total) by mouth 2 (two) times daily., Disp: 60 tablet, Rfl: 2 ?  nitroGLYCERIN (NITROSTAT) 0.4 MG SL tablet, Place 1 tablet (0.4 mg total) under the tongue every 5 (five) minutes as needed for chest pain. Maximum of 3 doses., Disp: 25 tablet, Rfl: 3 ?  nystatin (  MYCOSTATIN) 100000 UNIT/ML suspension, Take 5 ml by mouth four times a day swish and swallow, continue 2 days after symptoms are gone, Disp: 160 mL, Rfl: 0 ?  omeprazole (PRILOSEC) 20 MG capsule, TAKE 1 CAPSULE BY MOUTH ONCE A DAY AS NEEDED FOR HEARTBURN, Disp: 90 capsule, Rfl: 2 ?  rosuvastatin (CRESTOR) 5 MG tablet, Take 1 tablet (5 mg total) by mouth every Monday, Wednesday, and Friday., Disp: 12 tablet, Rfl: 2 ?  Tiotropium Bromide Monohydrate (SPIRIVA RESPIMAT) 1.25 MCG/ACT AERS, INHALE 2 PUFFS INTO THE LUNGS DAILY., Disp: 4 g, Rfl: 5 ?  tiZANidine (ZANAFLEX) 4 MG tablet, Take 1 tablet every 8 hours by oral route., Disp: 30 tablet, Rfl: 0 ?  TRUE METRIX BLOOD GLUCOSE  TEST test strip, , Disp: , Rfl: 99 ?  zinc gluconate 50 MG tablet, Take 50 mg by mouth daily., Disp: , Rfl:  ? ?Past Medical History: ?Past Medical History:  ?Diagnosis Date  ? Allergy   ? environmental  ? Arthritis   ? Asthma   ? CAD (coronary artery disease)   ? a. 08/2018 Cardiac CTA: CAC score 306 (91st %'ile). LM nl, LAD nl, LCX small w/ <50% mid stenosis. RI nl, RCA <50% mid, 51-69% distal (CTFFR of rPL 0.52); b. 12/2020 Cath: LM nl, LAD nl, D2 20, LCX nl, RCA 20p/m, RPAV 85. EF 55-65%-->Med Rx.  ? Claudication [facility])   ? a. 08/2019 ABI's nl.  ? COPD (chronic obstructive pulmonary disease) ([place])   ? Diabetes mellitus without complication ([place])   ? Diastolic dysfunction   ? a. 10/2018 Echo: EF 55-60%, Gr1 DD. Nl RV size/fxn.  ? GERD (gastroesophageal reflux disease)   ? Hyperlipidemia   ? Hypertension   ? Sleep apnea   ? C-Pap machine  ? Tobacco abuse   ? ? ?Tobacco Use: ?Social History  ? ?Tobacco Use  ?Smoking Status Former  ? Packs/day: 0.75  ? Years: 40.00  ? Pack years: 30.00  ? Types: Cigarettes  ? Start date: 12/15/1971  ?Smokeless Tobacco Never  ?Tobacco Comments  ? 1 cig daily--01/26/2021  ? ? ?Labs: ?Review Flowsheet   ? ?  ?  Latest Ref Rng & Units 02/12/2020 12/16/2020 09/24/2021 12/02/2021  ?Labs for [facility] Cardiac and Pulmonary Rehab  ?Cholestrol 100 - 199 mg/dL  161    123    ?LDL (calc) 0 - 99 mg/dL  63    51    ?HDL-C >39 mg/dL  55    45    ?Trlycerides 0 - 149 mg/dL  213    162    ?Hemoglobin A1c 4.8 - 5.6 % 5.9    6.6     ?  ?  ?  ? ? ? ?Exercise Target Goals: ?Exercise Program Goal: ?Individual exercise prescription set using results from initial 6 min walk test and THRR while considering  patient?s activity barriers and safety.  ? ?Exercise Prescription Goal: ?Initial exercise prescription builds to 30-45 minutes a day of aerobic activity, 2-3 days per week.  Home exercise guidelines will be given to patient during program as part of exercise prescription that the participant will  acknowledge. ? ? ?Education: Aerobic Exercise: ?- Group verbal and visual presentation on the components of exercise prescription. Introduces F.I.T.T principle from ACSM for exercise prescriptions.  Reviews F.I.T.T. principles of aerobic exercise including progression. Written material given at graduation. ? ? ?Education: Resistance Exercise: ?- Group verbal and visual presentation on the components of exercise prescription. Introduces F.I.T.T principle from ACSM for  exercise prescriptions  Reviews F.I.T.T. principles of resistance exercise including progression. Written material given at graduation. ?Flowsheet Row Cardiac Rehab from 02/02/2022 in [facility] Cardiac and Pulmonary Rehab  ?Date 02/02/22  ?Educator KL  ?Instruction Review Code 1- Verbalizes Understanding  ? ?  ? ?  ?Education: Exercise & Equipment Safety: ?- Individual verbal instruction and demonstration of equipment use and safety with use of the equipment. ?Flowsheet Row Cardiac Rehab from 02/02/2022 in [facility] Cardiac and Pulmonary Rehab  ?Date 12/02/21  ?Educator AS  ?Instruction Review Code 1- Verbalizes Understanding  ? ?  ? ? ?Education: Exercise Physiology & General Exercise Guidelines: ?- Group verbal and written instruction with models to review the exercise physiology of the cardiovascular system and associated critical values. Provides general exercise guidelines with specific guidelines to those with heart or lung disease.  ?Flowsheet Row Cardiac Rehab from 02/02/2022 in [facility] Cardiac and Pulmonary Rehab  ?Education need identified 12/02/21  ?Date 01/19/22  ?Educator KL  ?Instruction Review Code 1- Verbalizes Understanding  ? ?  ? ? ?Education: Flexibility, Balance, Mind/Body Relaxation: ?- Group verbal and visual presentation with interactive activity on the components of exercise prescription. Introduces F.I.T.T principle from ACSM for exercise prescriptions. Reviews F.I.T.T. principles of flexibility and balance exercise training including  progression. Also discusses the mind body connection.  Reviews various relaxation techniques to help reduce and manage stress (i.e. Deep breathing, progressive muscle relaxation, and visualization). Balance handout provided to take

## 2022-02-10 ENCOUNTER — Other Ambulatory Visit: Payer: Self-pay

## 2022-02-10 DIAGNOSIS — G4733 Obstructive sleep apnea (adult) (pediatric): Secondary | ICD-10-CM | POA: Diagnosis not present

## 2022-02-10 MED ORDER — ALENDRONATE SODIUM 70 MG PO TABS
ORAL_TABLET | ORAL | 4 refills | Status: AC
  Filled 2022-02-10: qty 12, 84d supply, fill #0
  Filled 2022-07-07: qty 12, 84d supply, fill #1
  Filled 2022-11-16: qty 12, 84d supply, fill #2

## 2022-02-10 NOTE — Patient Instructions (Signed)
Discharge Patient Instructions  Patient Details  Name: Monica Oliver MRN: [id number] Date of Birth: 1951/05/15 Referring Provider:  [name], MD   Number of Visits: 95  Reason for Discharge:  Patient reached a stable level of exercise. Patient independent in their exercise. Patient has met program and personal goals.  Smoking History:  Social History   Tobacco Use  Smoking Status Former   Packs/day: 0.75   Years: 40.00   Pack years: 30.00   Types: Cigarettes   Start date: 12/15/1971  Smokeless Tobacco Never  Tobacco Comments   1 cig daily--01/26/2021    Diagnosis:  Status post coronary artery stent placement  Initial Exercise Prescription:  Initial Exercise Prescription - 12/02/21 1600       Date of Initial Exercise RX and Referring Provider   Date 12/02/21    Referring Provider End      Oxygen   Maintain Oxygen Saturation 88% or higher      Treadmill   MPH 1.5    Grade 0.5    Minutes 15    METs 2.25      Recumbant Bike   Level 1    RPM 60    Minutes 15    METs 2.3      NuStep   Level 2    SPM 80    Minutes 15    METs 2.3      Track   Laps 20    Minutes 15    METs 2.1      Prescription Details   Frequency (times per week) 3    Duration Progress to 30 minutes of continuous aerobic without signs/symptoms of physical distress      Intensity   THRR 40-80% of Max Heartrate 112-138    Ratings of Perceived Exertion 11-13    Perceived Dyspnea 0-4      Resistance Training   Training Prescription Yes    Weight 3 lb    Reps 10-15             Discharge Exercise Prescription (Final Exercise Prescription Changes):  Exercise Prescription Changes - 02/03/22 1400       Response to Exercise   Blood Pressure (Admit) 110/62    Blood Pressure (Exit) 122/Monica    Heart Rate (Admit) 87 bpm    Heart Rate (Exercise) 107 bpm    Heart Rate (Exit) 87 bpm    Rating of Perceived Exertion (Exercise) 12    Symptoms none    Duration Continue with  30 min of aerobic exercise without signs/symptoms of physical distress.    Intensity THRR unchanged      Progression   Progression Continue to progress workloads to maintain intensity without signs/symptoms of physical distress.    Average METs 2.37      Resistance Training   Training Prescription Yes    Weight 3 lb    Reps 10-15      Interval Training   Interval Training No      Biostep-RELP   Level 2    Minutes 15    METs 2      Track   Laps 30    Minutes 15    METs 2.63      Home Exercise Plan   Plans to continue exercise at [facility] (comment)   [employer]; treadmill & RB   Frequency Add 3 additional days to program exercise sessions.   start with 1   Initial Home Exercises Provided 12/16/21  Oxygen   Maintain Oxygen Saturation 88% or higher             Functional Capacity:  6 Minute Walk     Row Name 12/02/21 1650 02/09/22 1734       6 Minute Walk   Phase Initial Discharge    Distance 1002 feet 930 feet    Distance % Change -- -10.7 %    Distance Feet Change -- -72 ft    Walk Time 6 minutes 6 minutes    # of Rest Breaks 0 2    MPH 1.9 1.76    METS 2.37 1.8    RPE 11 13    Perceived Dyspnea  3 2    VO2 Peak 8.3 6.31    Symptoms Yes (comment) Yes (comment)    Comments SOB Lower back was hurting. Stopped twice to stretch.    Resting HR 86 bpm 88 bpm    Resting BP 118/64 122/76    Resting Oxygen Saturation  97 % 96 %    Exercise Oxygen Saturation  during 6 min walk 97 % 96 %    Max Ex. HR 108 bpm 112 bpm    Max Ex. BP 152/64 144/64    2 Minute Post BP 118/62 120/74             Nutrition & Weight - Outcomes:  Pre Biometrics - 12/02/21 1655       Pre Biometrics   Height 5' 3"  (1.6 m)    Weight 180 lb 6.4 oz (81.8 kg)    BMI (Calculated) 31.96    Single Leg Stand 15.13 seconds              Nutrition:  Nutrition Therapy & Goals - 12/20/21 1704       Nutrition Therapy   Diet Heart healthy, low Na, T2DM MNT     Drug/Food Interactions Statins/Certain Fruits    Protein (specify units) 95g    Fiber 25 grams    Whole Grain Foods 3 servings    Saturated Fats 16 max. grams    Fruits and Vegetables 8 servings/day    Sodium 2 grams      Personal Nutrition Goals   Nutrition Goal ST: eat a rainbow of fruits/vegetables in a week, eat 2-3 servings of non-starchy vegetables at dinner, pair protein and/or fat with oatmeal in the morning and popcorn as a snack. LT: maintain A1C <7, increase variety of fruits/vegetables, eat at least 5 fruit/vegetable servings per day    Comments Monica Oliver admitted to cardiac rehab s/p stent placement. PMHx includes HTN, HLD, T2DM, iron deficiency anemia, COPD, CAD. Most recent A1C 6.6 (12/22). Relevant medications includes vit D, iron, lasix, metformin, omeprazole, crestor, tizanidine, zinc. PYP Score: 67. Vegetables & Fruits 7/12. Breads, Grains & Cereals 4/12. Red & Processed Meat 10/12. Poultry 1/2. Fish & Shellfish 1/4. Beans, Nuts & Seeds 2/4. Milk & Dairy Foods 3/6. Toppings, Oils, Seasonings & Salt 18/20. Sweets, Snacks & Restaurant Food 12/14. Beverages 9/10. B: oatmeal or raisin bran (2% milk) or cottage cheese with canned peaches - with medications. Decaff coffee black or sugar free hazelnut and water. S: baby carrots. S:12pm: pack of nabs or mini popcorn bag D: 5pm: ramen noodles or peanut butter sandwich or BBQ or baked chicken with string beans (canned - unsure which or fresh) S: raw baby carrots. Drinks: water, unsweet tea, or sometimes no sugar soda. She has had a diabetes nutrition therapy class previously and has made  changes since then. She remembers nutrition education from a couple weeks ago with this RD and reports no questions. Reviewed heart healthy and diabetes friendly eating. Suggested adding protein and fat to meals with carbohydrates such as peanut butter to oatmeal or nuts/seeds with popcorn, increase variety of fruits/vegetables, follow myplate structure and  include more fruits/vegetables at meals. She reports not being hungry during the day and eats when she is.      Intervention Plan   Intervention Prescribe, educate and counsel regarding individualized specific dietary modifications aiming towards targeted core components such as weight, hypertension, lipid management, diabetes, heart failure and other comorbidities.;Nutrition handout(s) given to patient.    Expected Outcomes Short Term Goal: Understand basic principles of dietary content, such as calories, fat, sodium, cholesterol and nutrients.;Short Term Goal: A plan has been developed with personal nutrition goals set during dietitian appointment.;Long Term Goal: Adherence to prescribed nutrition plan.              Goals reviewed with patient; copy given to patient.

## 2022-02-11 ENCOUNTER — Other Ambulatory Visit: Payer: Self-pay

## 2022-02-14 DIAGNOSIS — Z955 Presence of coronary angioplasty implant and graft: Secondary | ICD-10-CM

## 2022-02-14 NOTE — Progress Notes (Signed)
Daily Session Note  Patient Details  Name: Monica Oliver MRN: [id number] Date of Birth: 12-20-1950 Referring Provider:   Flowsheet Row Cardiac Rehab from 12/02/2021 in [facility] Cardiac and Pulmonary Rehab  Referring Provider End       Encounter Date: 02/14/2022  Check In:  Session Check In - 02/14/22 1724       Check-In   Supervising physician immediately available to respond to emergencies See telemetry face sheet for immediately available ER MD    Location [facility]-Cardiac & Pulmonary Rehab    Staff Present Birdie Sons, MPA, RN;Melissa [place], RDN, LDN;Joseph [place], RCP,RRT,BSRT    Virtual Visit No    Medication changes reported     No    Fall or balance concerns reported    No    Tobacco Cessation No Change    Warm-up and Cool-down Performed on first and last piece of equipment    Resistance Training Performed Yes    VAD Patient? No    PAD/SET Patient? No      Pain Assessment   Currently in Pain? No/denies                Social History   Tobacco Use  Smoking Status Former   Packs/day: 0.75   Years: 40.00   Pack years: 30.00   Types: Cigarettes   Start date: 12/15/1971  Smokeless Tobacco Never  Tobacco Comments   1 cig daily--01/26/2021    Goals Met:  Independence with exercise equipment Exercise tolerated well No report of concerns or symptoms today Strength training completed today  Goals Unmet:  Not Applicable  Comments: Pt able to follow exercise prescription today without complaint.  Will continue to monitor for progression.    Dr. Emily Filbert is Medical Director for [place].  Dr. Ottie Glazier is Medical Director for [facility] Pulmonary Rehabilitation.

## 2022-02-16 DIAGNOSIS — Z955 Presence of coronary angioplasty implant and graft: Secondary | ICD-10-CM | POA: Diagnosis not present

## 2022-02-16 NOTE — Progress Notes (Signed)
Daily Session Note  Patient Details  Name: [name] MRN: [id number] Date of Birth: [birth year] Referring Provider:   Flowsheet Row Cardiac Rehab from 12/02/2021 in [facility] Cardiac and Pulmonary Rehab  Referring Provider End       Encounter Date: 02/16/2022  Check In:  Session Check In - 02/16/22 1706       Check-In   Supervising physician immediately available to respond to emergencies See telemetry face sheet for immediately available ER MD    Location [facility]-Cardiac & Pulmonary Rehab    Staff Present Birdie Sons, MPA, Nino Glow, MS, ASCM CEP, Exercise Physiologist;Joseph Tessie Fass, [place]    Virtual Visit No    Medication changes reported     No    Fall or balance concerns reported    No    Tobacco Cessation No Change    Warm-up and Cool-down Performed on first and last piece of equipment    Resistance Training Performed Yes    VAD Patient? No    PAD/SET Patient? No      Pain Assessment   Currently in Pain? No/denies                Social History   Tobacco Use  Smoking Status Former   Packs/day: 0.75   Years: 40.00   Pack years: 30.00   Types: Cigarettes   Start date: 12/15/1971  Smokeless Tobacco Never  Tobacco Comments   1 cig daily--01/26/2021    Goals Met:  Independence with exercise equipment Exercise tolerated well No report of concerns or symptoms today Strength training completed today  Goals Unmet:  Not Applicable  Comments: Pt able to follow exercise prescription today without complaint.  Will continue to monitor for progression.    Dr. Emily Filbert is Medical Director for [place].  Dr. Ottie Glazier is Medical Director for [facility] Pulmonary Rehabilitation.

## 2022-02-17 ENCOUNTER — Encounter: Payer: BC Managed Care – PPO | Admitting: *Deleted

## 2022-02-17 ENCOUNTER — Other Ambulatory Visit: Payer: Self-pay

## 2022-02-17 DIAGNOSIS — Z955 Presence of coronary angioplasty implant and graft: Secondary | ICD-10-CM

## 2022-02-17 NOTE — Progress Notes (Signed)
Daily Session Note  Patient Details  Name: Monica Oliver MRN: [id number] Date of Birth: July 22, 1951 Referring Provider:   Flowsheet Row Cardiac Rehab from 12/02/2021 in [facility] Cardiac [facility]monary Rehab  Referring Provider End       Encounter Date: 02/17/2022  Check In:  Session Check In - 02/17/22 [place]       Check-In   Supervising physician immediately available to respond to emergencies See telemetry face sheet for immediately available ER MD    Staff Present Nyoka Cowden, RN, BSN, Lauretta Grill, RCP,RRT,BSRT;Kara [place], MS, ASCM CEP, Exercise Physiologist    Virtual Visit No    Medication changes reported     No    Fall or balance concerns reported    No    Tobacco Cessation No Change    Warm-up and Cool-down Performed on first and last piece of equipment    Resistance Training Performed Yes    VAD Patient? No    PAD/SET Patient? No      Pain Assessment   Currently in Pain? No/denies                Social History   Tobacco Use  Smoking Status Former   Packs/day: 0.75   Years: 40.00   Pack years: 30.00   Types: Cigarettes   Start date: 12/15/1971  Smokeless Tobacco Never  Tobacco Comments   1 cig daily--01/26/2021    Goals Met:  Independence with exercise equipment Exercise tolerated well No report of concerns or symptoms today  Goals Unmet:  Not Applicable  Comments: Pt able to follow exercise prescription today without complaint.  Will continue to monitor for progression.    Dr. Emily Filbert is Medical Director for [place].  Dr. Ottie Glazier is Medical Director for [facility] Pulmonary Rehabilitation.

## 2022-02-22 ENCOUNTER — Other Ambulatory Visit: Payer: Self-pay

## 2022-02-22 ENCOUNTER — Ambulatory Visit: Payer: BC Managed Care – PPO | Admitting: Internal Medicine

## 2022-02-22 ENCOUNTER — Other Ambulatory Visit: Payer: Self-pay | Admitting: Internal Medicine

## 2022-02-22 ENCOUNTER — Encounter: Payer: Self-pay | Admitting: Internal Medicine

## 2022-02-22 VITALS — BP 106/60 | HR 90 | Temp 97.6°F | Ht 63.0 in | Wt 183.8 lb

## 2022-02-22 DIAGNOSIS — F17210 Nicotine dependence, cigarettes, uncomplicated: Secondary | ICD-10-CM

## 2022-02-22 DIAGNOSIS — J849 Interstitial pulmonary disease, unspecified: Secondary | ICD-10-CM

## 2022-02-22 DIAGNOSIS — G4733 Obstructive sleep apnea (adult) (pediatric): Secondary | ICD-10-CM

## 2022-02-22 DIAGNOSIS — Z720 Tobacco use: Secondary | ICD-10-CM

## 2022-02-22 MED ORDER — AMLODIPINE BESYLATE 5 MG PO TABS
5.0000 mg | ORAL_TABLET | Freq: Every day | ORAL | 0 refills | Status: DC
  Filled 2022-02-22: qty 30, 30d supply, fill #0

## 2022-02-22 NOTE — Patient Instructions (Addendum)
Please stop smoking continue CPAP as prescribed Symbicort as prescribed 2 puffs twice daily Albuterol as needed Repeat CT chest pending this year

## 2022-02-22 NOTE — Progress Notes (Signed)
PULMONARY OFFICE FOLLOW UP NOTE  PROBLEMS:  Mild COPD Smoker  DATA: LDCT 06/07/16: normal PFTs 06/13/16: mild obstruction with borderline significant improvement after bronchodilator. Normal TLC. Mildly reduced DLCO  INTERVAL HISTORY: Previously seen by VM and diagnosed with mild COPD.    CC  Follow-up RB ILD Follow-up abnormal CT chest Follow-up OSA     HPI Diagnosis of OSA Compliant with CPAP 8 cm water pressure Excellent compliance report reviewed in detail with the patient She definitely benefits and use CPAP therapy nightly   No exacerbation at this time No evidence of heart failure at this time No evidence or signs of infection at this time No respiratory distress No fevers, chills, nausea, vomiting, diarrhea No evidence of lower extremity edema No evidence hemoptysis    Patient with previous history of bilateral lung nodules Previous CT chest July 08, 2020 Patient will need ongoing CT chest due to ongoing tobacco abuse Follow-up CT chest in 1 year   Smoking Assessment and Cessation Counseling Upon further questioning, Patient smokes 1/2 ppd I have advised patient to quit/stop smoking as soon as possible due to high risk for multiple medical problems  Patient is  NOT willing to quit smoking  I have advised patient that we can assist and have options of Nicotine replacement therapy. I also advised patient on behavioral therapy and can provide oral medication therapy in conjunction with the other therapies Follow up next Office visit  for assessment of smoking cessation Smoking cessation counseling advised for 4 minutes       Review of Systems: Gen:  Denies  fever, sweats, chills weight loss  HEENT: Denies blurred vision, double vision, ear pain, eye pain, hearing loss, nose bleeds, sore throat Cardiac:  No dizziness, chest pain or heaviness, chest tightness,edema, No JVD Resp:   No cough, -sputum production, -shortness of breath,-wheezing,  -hemoptysis,  Other:  All other systems negative  BP 106/60 (BP Location: Left Arm, Cuff Size: Normal)   Pulse 90   Temp 97.6 F (36.4 C) (Temporal)   Ht '5\' 3"'$  (1.6 m)   Wt 183 lb 12.8 oz (83.4 kg)   SpO2 97%   BMI 32.56 kg/m    Physical Examination:   General Appearance: No distress  EYES PERRLA, EOM intact.   NECK Supple, No JVD Pulmonary: normal breath sounds, No wheezing.  CardiovascularNormal S1,S2.  No m/r/g.   ALL OTHER ROS ARE NEGATIVE    DATA:   CXR 09/24/16: vague left upper lobe opacity   CXR 11/10/16 : resolution of LUL opacity CT chest 07/16/2018-and discrete bilateral pulmonary nodules not visualized but reported Mild lymphadenopathy anterior tracheal space  CT chest 06/2021 No suspicious focal pulmonary nodules or infiltrates.  Stable subtle peribronchial nodularity within the lung apices bilaterally possibly representing changes of RB-ILD/smoking related lung disease.  CT scan results reviewed with patient in detail     ASSESSMENT AND PLAN  71 year old [place] female with underlying diagnosis of mild COPD with obstructive sleep apnea in setting of bilateral scattered pulmonary nodules in the setting of tobacco abuse setting of obesity and deconditioned state with probable related RB ILD   COPD mild gold stage a Symbicort as prescribed Albuterol as needed No exacerbation at this time  OSA Excellent compliance report Continue CPAP as prescribed   Smoking cessation strongly advised  Obesity -recommend significant weight loss -recommend changing diet  Deconditioned state -Recommend increased daily activity and exercise    Lung cancer screening program CT of the chest from October 2022  reviewed in detail No suspicious focal nodules or infiltrates seen Findings reflect RB ILD smoking-related disease      MEDICATION ADJUSTMENTS/LABS AND TESTS ORDERED: Please stop smoking continue CPAP as prescribed Symbicort  Albuterol as  needed    CURRENT MEDICATIONS REVIEWED AT LENGTH WITH PATIENT TODAY  Follow-up in 1 year  Total time spent 26 minutes    Phyllis Abelson Patricia Pesa, M.D.  [facility]  Medical Director [place] Director [facility] Cardio-Pulmonary Department

## 2022-03-01 ENCOUNTER — Other Ambulatory Visit: Payer: Self-pay

## 2022-03-01 ENCOUNTER — Other Ambulatory Visit: Payer: Self-pay | Admitting: Internal Medicine

## 2022-03-01 NOTE — Telephone Encounter (Signed)
Patient is requesting refill on Spiriva and Flonase. These medication are not mentioned in last OV.   Dr. Mortimer Fries, please advise. Thanks

## 2022-03-02 ENCOUNTER — Other Ambulatory Visit: Payer: Self-pay

## 2022-03-02 MED FILL — Fluticasone Propionate Nasal Susp 50 MCG/ACT: NASAL | 60 days supply | Qty: 16 | Fill #0 | Status: CN

## 2022-03-02 MED FILL — Tiotropium Bromide Monohydrate Inhal Aerosol 1.25 MCG/ACT: RESPIRATORY_TRACT | 30 days supply | Qty: 4 | Fill #0 | Status: AC

## 2022-03-03 ENCOUNTER — Other Ambulatory Visit: Payer: Self-pay

## 2022-03-09 ENCOUNTER — Encounter: Payer: Self-pay | Admitting: *Deleted

## 2022-03-09 DIAGNOSIS — Z955 Presence of coronary angioplasty implant and graft: Secondary | ICD-10-CM

## 2022-03-09 NOTE — Progress Notes (Signed)
Cardiac Individual Treatment Plan  Patient Details  Name: Monica Oliver MRN: [id number] Date of Birth: [birth year] Referring Provider:   Flowsheet Row Cardiac Rehab from 12/02/2021 in [facility] Cardiac and Pulmonary Rehab  Referring Provider End       Initial Encounter Date:  Flowsheet Row Cardiac Rehab from 12/02/2021 in [facility] Cardiac and Pulmonary Rehab  Date 12/02/21       Visit Diagnosis: Status post coronary artery stent placement  Patient's Home Medications on Admission:  Current Outpatient Medications:    albuterol (PROVENTIL) (2.5 MG/3ML) 0.083% nebulizer solution, TAKE 3 MLS (2.5 MG TOTAL) BY NEBULIZATION EVERY 6 HOURS AS NEEDED FOR WHEEZING OR SHORTNESS OF BREATH., Disp: 75 mL, Rfl: 5   albuterol (VENTOLIN HFA) 108 (90 Base) MCG/ACT inhaler, INHALE 2 PUFFS BY MOUTH EVERY 4 HOURS AS NEEDED, Disp: 18 g, Rfl: 12   alendronate (FOSAMAX) 70 MG tablet, Take 1 tablet by mouth once a week for bone strength. Take 30 min before other meds with full glass water, Disp: 13 tablet, Rfl: 4   amLODipine (NORVASC) 5 MG tablet, Take 1 tablet (5 mg total) by mouth daily., Disp: 30 tablet, Rfl: 0   aspirin 81 MG tablet, Take 81 mg by mouth daily., Disp: , Rfl:    budesonide-formoterol (SYMBICORT) 160-4.5 MCG/ACT inhaler, INHALE 2 PUFFS BY MOUTH TWO TIMES DAILY, Disp: 10.2 g, Rfl: 5   cetirizine (ZYRTEC) 10 MG tablet, Take 10 mg by mouth daily as needed for allergies., Disp: , Rfl:    Cholecalciferol (D3-50) 1.25 MG (50000 UT) capsule, Take 1 capsule by mouth once a week, Disp: 8 capsule, Rfl: 0   cholecalciferol (VITAMIN D3) 25 MCG (1000 UNIT) tablet, Take 1,000 Units by mouth daily., Disp: , Rfl:    clopidogrel (PLAVIX) 75 MG tablet, Take 1 tablet (75 mg total) by mouth daily with breakfast., Disp: 30 tablet, Rfl: 2   ezetimibe (ZETIA) 10 MG tablet, Take 1 tablet (10 mg total) by mouth daily., Disp: 30 tablet, Rfl: 2   ferrous sulfate 325 (65 FE) MG tablet, TAKE 1 TABLET BY MOUTH EVERY OTHER DAY FOR  ANEMIA (SPANISH), Disp: 90 tablet, Rfl: 2   fluticasone (FLONASE) 50 MCG/ACT nasal spray, Place 1 spray into both nostrils daily as needed for allergies or rhinitis., Disp: 16 g, Rfl: 12   furosemide (LASIX) 20 MG tablet, Take 1 tablet (20 mg total) by mouth daily as needed (weight gain / edema)., Disp: 30 tablet, Rfl: 2   gabapentin (NEURONTIN) 300 MG capsule, Take 1 capsule (300 mg total) by mouth 3 (three) times daily as needed for pain., Disp: 270 capsule, Rfl: 3   glucose blood (FREESTYLE LITE) test strip, Test once a day as directed, Disp: 100 each, Rfl: 11   Insulin Pen Needle (UNIFINE PENTIPS) 31G X 5 MM MISC, Use pen needles with pen injections daily, dx E11.9, Disp: 100 each, Rfl: 3   liraglutide (VICTOZA) 18 MG/3ML SOPN, Inject 0.6 mg subcutaneously once a day for 1 week then increase to 1.103m daily for diabetes and heart health., Disp: 9 mL, Rfl: 2   lisinopril (ZESTRIL) 10 MG tablet, Take 1 tablet (10 mg total) by mouth daily., Disp: 30 tablet, Rfl: 2   metoprolol tartrate (LOPRESSOR) 50 MG tablet, Take 1 tablet (50 mg total) by mouth 2 (two) times daily., Disp: 60 tablet, Rfl: 2   nitroGLYCERIN (NITROSTAT) 0.4 MG SL tablet, Place 1 tablet (0.4 mg total) under the tongue every 5 (five) minutes as needed for chest pain. Maximum  of 3 doses., Disp: 25 tablet, Rfl: 3   omeprazole (PRILOSEC) 20 MG capsule, TAKE 1 CAPSULE BY MOUTH ONCE A DAY AS NEEDED FOR HEARTBURN, Disp: 90 capsule, Rfl: 2   rosuvastatin (CRESTOR) 5 MG tablet, Take 1 tablet (5 mg total) by mouth every Monday, Wednesday, and Friday., Disp: 12 tablet, Rfl: 2   Tiotropium Bromide Monohydrate (SPIRIVA RESPIMAT) 1.25 MCG/ACT AERS, INHALE 2 PUFFS INTO THE LUNGS DAILY., Disp: 4 g, Rfl: 5   tiZANidine (ZANAFLEX) 4 MG tablet, Take 1 tablet every 8 hours by oral route., Disp: 30 tablet, Rfl: 0   TRUE METRIX BLOOD GLUCOSE TEST test strip, , Disp: , Rfl: 99   zinc gluconate 50 MG tablet, Take 50 mg by mouth daily., Disp: , Rfl:   Past  Medical History: Past Medical History:  Diagnosis Date   Allergy    environmental   Arthritis    Asthma    CAD (coronary artery disease)    a. 08/2018 Cardiac CTA: CAC score 306 (91st %'ile). LM nl, LAD nl, LCX small w/ <50% mid stenosis. RI nl, RCA <50% mid, 51-69% distal (CTFFR of rPL 0.52); b. 12/2020 Cath: LM nl, LAD nl, D2 20, LCX nl, RCA 20p/m, RPAV 85. EF 55-65%-->Med Rx.   Claudication ([place])    a. 08/2019 ABI's nl.   COPD (chronic obstructive pulmonary disease) (HCC)    Diabetes mellitus without complication (HCC)    Diastolic dysfunction    a. 10/2018 Echo: EF 55-60%, Gr1 DD. Nl RV size/fxn.   GERD (gastroesophageal reflux disease)    Hyperlipidemia    Hypertension    Sleep apnea    C-Pap machine   Tobacco abuse     Tobacco Use: Social History   Tobacco Use  Smoking Status Some Days   Packs/day: 0.75   Years: 40.00   Total pack years: 30.00   Types: Cigarettes   Start date: 12/15/1971  Smokeless Tobacco Never  Tobacco Comments   1 pack will last three weeks-02/22/2022    Labs: Review Flowsheet       Latest Ref Rng & Units 02/12/2020 12/16/2020 09/24/2021 12/02/2021  Labs for ITP Cardiac and Pulmonary Rehab  Cholestrol 100 - 199 mg/dL - 161  - 123   LDL (calc) 0 - 99 mg/dL - 63  - 51   HDL-C >39 mg/dL - 55  - 45   Trlycerides 0 - 149 mg/dL - 213  - 162   Hemoglobin A1c 4.8 - 5.6 % 5.9  - 6.6  -     Exercise Target Goals: Exercise Program Goal: Individual exercise prescription set using results from initial 6 min walk test and THRR while considering  patient's activity barriers and safety.   Exercise Prescription Goal: Initial exercise prescription builds to 30-45 minutes a day of aerobic activity, 2-3 days per week.  Home exercise guidelines will be given to patient during program as part of exercise prescription that the participant will acknowledge.   Education: Aerobic Exercise: - Group verbal and visual presentation on the components of exercise  prescription. Introduces F.I.T.T principle from ACSM for exercise prescriptions.  Reviews F.I.T.T. principles of aerobic exercise including progression. Written material given at graduation.   Education: Resistance Exercise: - Group verbal and visual presentation on the components of exercise prescription. Introduces F.I.T.T principle from ACSM for exercise prescriptions  Reviews F.I.T.T. principles of resistance exercise including progression. Written material given at graduation. Flowsheet Row Cardiac Rehab from 02/16/2022 in [facility] Cardiac and Pulmonary Rehab  Date 02/02/22  Educator [place]  Instruction Review Code 1- Verbalizes Understanding        Education: Exercise & Equipment Safety: - Individual verbal instruction and demonstration of equipment use and safety with use of the equipment. Flowsheet Row Cardiac Rehab from 02/16/2022 in [facility] Cardiac and Pulmonary Rehab  Date 12/02/21  Educator [place]  Instruction Review Code 1- Verbalizes Understanding       Education: Exercise Physiology & General Exercise Guidelines: - Group verbal and written instruction with models to review the exercise physiology of the cardiovascular system and associated critical values. Provides general exercise guidelines with specific guidelines to those with heart or lung disease.  Flowsheet Row Cardiac Rehab from 02/16/2022 in [facility] Cardiac and Pulmonary Rehab  Education need identified 12/02/21  Date 01/19/22  Educator [place]  Instruction Review Code 1- Verbalizes Understanding       Education: Flexibility, Balance, Mind/Body Relaxation: - Group verbal and visual presentation with interactive activity on the components of exercise prescription. Introduces F.I.T.T principle from ACSM for exercise prescriptions. Reviews F.I.T.T. principles of flexibility and balance exercise training including progression. Also discusses the mind body connection.  Reviews various relaxation techniques to help reduce and manage stress  (i.e. Deep breathing, progressive muscle relaxation, and visualization). Balance handout provided to take home. Written material given at graduation. Flowsheet Row Cardiac Rehab from 02/16/2022 in [facility] Cardiac and Pulmonary Rehab  Date 02/09/22  Educator [place]  Instruction Review Code 1- Verbalizes Understanding       Activity Barriers & Risk Stratification:  Activity Barriers & Cardiac Risk Stratification - 11/15/21 1534       Activity Barriers & Cardiac Risk Stratification   Activity Barriers Shortness of Breath;Back Problems;Arthritis    Cardiac Risk Stratification High             6 Minute Walk:  6 Minute Walk     Row Name 12/02/21 1650 02/09/22 1734       6 Minute Walk   Phase Initial Discharge    Distance 1002 feet 930 feet    Distance % Change -- -10.7 %    Distance Feet Change -- -72 ft    Walk Time 6 minutes 6 minutes    # of Rest Breaks 0 2    MPH 1.9 1.76    METS 2.37 1.8    RPE 11 13    Perceived Dyspnea  3 2    VO2 Peak 8.3 6.31    Symptoms Yes (comment) Yes (comment)    Comments SOB Lower back was hurting. Stopped twice to stretch.    Resting HR 86 bpm 88 bpm    Resting BP 118/64 122/76    Resting Oxygen Saturation  97 % 96 %    Exercise Oxygen Saturation  during 6 min walk 97 % 96 %    Max Ex. HR 108 bpm 112 bpm    Max Ex. BP 152/64 144/64    2 Minute Post BP 118/62 120/74             Oxygen Initial Assessment:   Oxygen Re-Evaluation:   Oxygen Discharge (Final Oxygen Re-Evaluation):   Initial Exercise Prescription:  Initial Exercise Prescription - 12/02/21 1600       Date of Initial Exercise RX and Referring Provider   Date 12/02/21    Referring Provider End      Oxygen   Maintain Oxygen Saturation 88% or higher      Treadmill   MPH 1.5    Grade 0.5  Minutes 15    METs 2.25      Recumbant Bike   Level 1    RPM 60    Minutes 15    METs 2.3      NuStep   Level 2    SPM 80    Minutes 15    METs 2.3      Track    Laps 20    Minutes 15    METs 2.1      Prescription Details   Frequency (times per week) 3    Duration Progress to 30 minutes of continuous aerobic without signs/symptoms of physical distress      Intensity   THRR 40-80% of Max Heartrate 112-138    Ratings of Perceived Exertion 11-13    Perceived Dyspnea 0-4      Resistance Training   Training Prescription Yes    Weight 3 lb    Reps 10-15             Perform Capillary Blood Glucose checks as needed.  Exercise Prescription Changes:   Exercise Prescription Changes     Row Name 12/02/21 1600 12/16/21 1700 12/20/21 1600 01/05/22 1600 01/19/22 1400     Response to Exercise   Blood Pressure (Admit) 118/64 -- 118/70 132/70 122/62   Blood Pressure (Exercise) 152/64 -- 142/76 138/64 --   Blood Pressure (Exit) 118/62 -- 136/72 124/72 110/62   Heart Rate (Admit) 86 bpm -- 77 bpm 93 bpm 93 bpm   Heart Rate (Exercise) 108 bpm -- 105 bpm 117 bpm 100 bpm   Heart Rate (Exit) 88 bpm -- 87 bpm 94 bpm 82 bpm   Oxygen Saturation (Admit) 97 % -- -- -- --   Oxygen Saturation (Exercise) 97 % -- -- -- --   Rating of Perceived Exertion (Exercise) 11 -- _0 Perceived Dyspnea (Exercise) 3 -- -- -- --   Symptoms SOB -- none none none   Duration -- -- Continue with 30 min of aerobic exercise without signs/symptoms of physical distress. Continue with 30 min of aerobic exercise without signs/symptoms of physical distress. Continue with 30 min of aerobic exercise without signs/symptoms of physical distress.   Intensity -- -- THRR unchanged THRR unchanged THRR unchanged     Progression   Progression -- -- Continue to progress workloads to maintain intensity without signs/symptoms of physical distress. Continue to progress workloads to maintain intensity without signs/symptoms of physical distress. Continue to progress workloads to maintain intensity without signs/symptoms of physical distress.   Average METs -- -- 2.28 2.34 2.13      Resistance Training   Training Prescription -- -- Yes Yes Yes   Weight -- -- 3 lb 3 lb 3 lb   Reps -- -- 10-15 10-15 10-15     Interval Training   Interval Training -- -- No No No     Treadmill   MPH -- -- 1.5 1.6 --   Grade -- -- 0.5 0.5 --   Minutes -- -- 15 15 --   METs -- -- 2.25 2.34 --     Recumbant Bike   Level -- -- 1 -- --   Minutes -- -- 15 -- --     NuStep   Level -- -- 2 -- 2   Minutes -- -- 15 -- 30   METs -- -- 2.6 -- 2.3     Biostep-RELP   Level -- -- 1 -- 2   Minutes -- -- 15 --  30   METs -- -- 2 -- 2     Home Exercise Plan   Plans to continue exercise at -- [employer] (comment)  [employer]; treadmill & [place] (comment)  [employer]; treadmill & [place] (comment)  [employer]; treadmill & [place] (comment)  [employer]; treadmill & RB   Frequency -- Add 3 additional days to program exercise sessions.  start with 1 Add 3 additional days to program exercise sessions.  start with 1 Add 3 additional days to program exercise sessions.  start with 1 Add 3 additional days to program exercise sessions.  start with 1   Initial Home Exercises Provided -- 12/16/21 12/16/21 12/16/21 12/16/21     Oxygen   Maintain Oxygen Saturation -- -- 88% or higher 88% or higher 88% or higher    Row Name 02/03/22 1400             Response to Exercise   Blood Pressure (Admit) 110/62       Blood Pressure (Exit) 122/70       Heart Rate (Admit) 87 bpm       Heart Rate (Exercise) 107 bpm       Heart Rate (Exit) 87 bpm       Rating of Perceived Exertion (Exercise) 12       Symptoms none       Duration Continue with 30 min of aerobic exercise without signs/symptoms of physical distress.       Intensity THRR unchanged         Progression   Progression Continue to progress workloads to maintain intensity without signs/symptoms of physical distress.       Average METs 2.37         Resistance Training   Training  Prescription Yes       Weight 3 lb       Reps 10-15         Interval Training   Interval Training No         Biostep-RELP   Level 2       Minutes 15       METs 2         Track   Laps 30       Minutes 15       METs 2.63         Home Exercise Plan   Plans to continue exercise at [facility] (comment)  [employer]; treadmill & RB       Frequency Add 3 additional days to program exercise sessions.  start with 1       Initial Home Exercises Provided 12/16/21         Oxygen   Maintain Oxygen Saturation 88% or higher                Exercise Comments:   Exercise Goals and Review:   Exercise Goals     Row Name 12/02/21 1654             Exercise Goals   Increase Physical Activity Yes       Intervention Provide advice, education, support and counseling about physical activity/exercise needs.;Develop an individualized exercise prescription for aerobic and resistive training based on initial evaluation findings, risk stratification, comorbidities and participant's personal goals.       Expected Outcomes Short Term: Attend rehab on a regular basis to increase amount of physical activity.;Long Term: Add in home exercise to make exercise part  of routine and to increase amount of physical activity.;Long Term: Exercising regularly at least 3-5 days a week.       Increase Strength and Stamina Yes       Intervention Provide advice, education, support and counseling about physical activity/exercise needs.;Develop an individualized exercise prescription for aerobic and resistive training based on initial evaluation findings, risk stratification, comorbidities and participant's personal goals.       Expected Outcomes Short Term: Increase workloads from initial exercise prescription for resistance, speed, and METs.;Short Term: Perform resistance training exercises routinely during rehab and add in resistance training at home;Long Term: Improve cardiorespiratory fitness, muscular  endurance and strength as measured by increased METs and functional capacity (6MWT)       Able to understand and use rate of perceived exertion (RPE) scale Yes       Intervention Provide education and explanation on how to use RPE scale       Expected Outcomes Short Term: Able to use RPE daily in rehab to express subjective intensity level;Long Term:  Able to use RPE to guide intensity level when exercising independently       Able to understand and use Dyspnea scale Yes       Intervention Provide education and explanation on how to use Dyspnea scale       Expected Outcomes Short Term: Able to use Dyspnea scale daily in rehab to express subjective sense of shortness of breath during exertion;Long Term: Able to use Dyspnea scale to guide intensity level when exercising independently       Knowledge and understanding of Target Heart Rate Range (THRR) Yes       Intervention Provide education and explanation of THRR including how the numbers were predicted and where they are located for reference       Expected Outcomes Short Term: Able to state/look up THRR;Long Term: Able to use THRR to govern intensity when exercising independently       Able to check pulse independently Yes       Intervention Provide education and demonstration on how to check pulse in carotid and radial arteries.;Review the importance of being able to check your own pulse for safety during independent exercise       Expected Outcomes Short Term: Able to explain why pulse checking is important during independent exercise;Long Term: Able to check pulse independently and accurately       Understanding of Exercise Prescription Yes       Intervention Provide education, explanation, and written materials on patient's individual exercise prescription       Expected Outcomes Short Term: Able to explain program exercise prescription;Long Term: Able to explain home exercise prescription to exercise independently                Exercise  Goals Re-Evaluation :  Exercise Goals Re-Evaluation     Row Name 12/06/21 1734 12/16/21 1752 12/20/21 1610 01/05/22 1610 01/19/22 1421     Exercise Goal Re-Evaluation   Exercise Goals Review Increase Physical Activity;Able to understand and use rate of perceived exertion (RPE) scale;Knowledge and understanding of Target Heart Rate Range (THRR);Understanding of Exercise Prescription;Increase Strength and Stamina;Able to check pulse independently Increase Physical Activity;Increase Strength and Stamina;Understanding of Exercise Prescription Increase Physical Activity;Increase Strength and Stamina;Understanding of Exercise Prescription Increase Physical Activity;Increase Strength and Stamina Increase Physical Activity;Increase Strength and Stamina   Comments Reviewed RPE and dyspnea scales, THR and program prescription with pt today.  Pt voiced understanding and was given a copy  of goals to take home. Reviewed home exercise with pt today.  Pt plans to go to her apartment gym for exercise which has the treadmill and RB. Reviewed THR, pulse, RPE, sign and symptoms, pulse oximetery and when to call 911 or MD.  Also discussed weather considerations and indoor options.  Pt voiced understanding. Rox is off to a good start in rehab.  She is up to level 2 on the NuStep and 0.5% grade on the treadmill.  We will conitnue to monitor her progress. Otilia is progressing well.  She reaches THR range on TM since she increased speed.  Staff will encourage trying 4 lb for strength work. Alyanah has been coming fairly consistently to rehab. She has not walked in the last several sessions due to having back pain. She did have some type of nerve test completed to help alleviate the pain and hope she can start walking again as tolerated. She would benefit from trying different machines to help reach her [place]. Will continue to monitr   Expected Outcomes Short: Use RPE daily to regulate intensity. Long: Follow program prescription in  THR. Short: Start with 1 day of exercise and check HR Long: Continue to increase overall MET level Short: Move up BioStep and NuStep to level 3 Long: Continue to improve stamina. Short: continue to increase workloas, work in [employer] range Long: build overall stamina Short: Get back to walking track or TM Long: Continue to increase overall MET level    [place] Name 02/03/22 1442             Exercise Goal Re-Evaluation   Exercise Goals Review Increase Physical Activity;Increase Strength and Stamina;Understanding of Exercise Prescription       Comments Rosabella has been consistent again with her attendance with rehab. She is back to walking again and was able to complete 30 laps on the track. RPEs are in appropriate range. She is due for her post 6MWT soon and hope to see significant improvement. Will continue to monitor.       Expected Outcomes Short: Improve on post 6MWT Long: Continue to build up strength and stamina                Discharge Exercise Prescription (Final Exercise Prescription Changes):  Exercise Prescription Changes - 02/03/22 1400       Response to Exercise   Blood Pressure (Admit) 110/62    Blood Pressure (Exit) 122/70    Heart Rate (Admit) 87 bpm    Heart Rate (Exercise) 107 bpm    Heart Rate (Exit) 87 bpm    Rating of Perceived Exertion (Exercise) 12    Symptoms none    Duration Continue with 30 min of aerobic exercise without signs/symptoms of physical distress.    Intensity THRR unchanged      Progression   Progression Continue to progress workloads to maintain intensity without signs/symptoms of physical distress.    Average METs 2.37      Resistance Training   Training Prescription Yes    Weight 3 lb    Reps 10-15      Interval Training   Interval Training No      Biostep-RELP   Level 2    Minutes 15    METs 2      Track   Laps 30    Minutes 15    METs 2.63      Home Exercise Plan   Plans to continue exercise at [facility] (comment)    Apartment  [employer]; treadmill & RB   Frequency Add 3 additional days to program exercise sessions.   start with 1   Initial Home Exercises Provided 12/16/21      Oxygen   Maintain Oxygen Saturation 88% or higher             Nutrition:  Target Goals: Understanding of nutrition guidelines, daily intake of sodium <1539m, cholesterol <2065m calories 30% from fat and 7% or less from saturated fats, daily to have 5 or more servings of fruits and vegetables.  Education: All About Nutrition: -Group instruction provided by verbal, written material, interactive activities, discussions, models, and posters to present general guidelines for heart healthy nutrition including fat, fiber, MyPlate, the role of sodium in heart healthy nutrition, utilization of the nutrition label, and utilization of this knowledge for meal planning. Follow up email sent as well. Written material given at graduation. Flow[facility]  Education need identified 12/02/21  Date 02/16/22  Educator [place]ong CreekInstruction Review Code 1- Verbalizes Understanding       Biometrics:  Pre Biometrics - 12/02/21 1655       Pre Biometrics   Height 5' 3" (1.6 m)    Weight 180 lb 6.4 oz (81.8 kg)    BMI (Calculated) 31.96    Single Leg Stand 15.13 seconds              Nutrition Therapy Plan and Nutrition Goals:  Nutrition Therapy & Goals - 12/20/21 1704       Nutrition Therapy   Diet Heart healthy, low Na, T2DM MNT    Drug/Food Interactions Statins/Certain Fruits    Protein (specify units) 95g    Fiber 25 grams    Whole Grain Foods 3 servings    Saturated Fats 16 max. grams    Fruits and Vegetables 8 servings/day    Sodium 2 grams      Personal Nutrition Goals   Nutrition Goal ST: eat a rainbow of fruits/vegetables in a week, eat 2-3 servings of non-starchy vegetables at dinner, pair protein and/or fat with oatmeal in the morning and popcorn as a snack. LT:  maintain A1C <7, increase variety of fruits/vegetables, eat at least 5 fruit/vegetable servings per day    Comments [age].o. F admitted to cardiac rehab s/p stent placement. PMHx includes HTN, HLD, T2DM, iron deficiency anemia, COPD, CAD. Most recent A1C 6.6 (12/22). Relevant medications includes vit D, iron, lasix, metformin, omeprazole, crestor, tizanidine, zinc. PYP Score: 67. Vegetables & Fruits 7/12. Breads, Grains & Cereals 4/12. Red & Processed Meat 10/12. Poultry 1/2. Fish & Shellfish 1/4. Beans, Nuts & Seeds 2/4. Milk & Dairy Foods 3/6. Toppings, Oils, Seasonings & Salt 18/20. Sweets, Snacks & Restaurant Food 12/14. Beverages 9/10. B: oatmeal or raisin bran (2% milk) or cottage cheese with canned peaches - with medications. Decaff coffee black or sugar free hazelnut and water. S: baby carrots. S:12pm: pack of nabs or mini popcorn bag D: 5pm: ramen noodles or peanut butter sandwich or BBQ or baked chicken with string beans (canned - unsure which or fresh) S: raw baby carrots. Drinks: water, unsweet tea, or sometimes no sugar soda. She has had a diabetes nutrition therapy class previously and has made changes since then. She remembers nutrition education from a couple weeks ago with this RD and reports no questions. Reviewed heart healthy and diabetes friendly eating. Suggested adding protein and fat to meals with carbohydrates such as peanut butter to oatmeal  or nuts/seeds with popcorn, increase variety of fruits/vegetables, follow myplate structure and include more fruits/vegetables at meals. She reports not being hungry during the day and eats when she is.      Intervention Plan   Intervention Prescribe, educate and counsel regarding individualized specific dietary modifications aiming towards targeted core components such as weight, hypertension, lipid management, diabetes, heart failure and other comorbidities.;Nutrition handout(s) given to patient.    Expected Outcomes Short Term Goal: Understand  basic principles of dietary content, such as calories, fat, sodium, cholesterol and nutrients.;Short Term Goal: A plan has been developed with personal nutrition goals set during dietitian appointment.;Long Term Goal: Adherence to prescribed nutrition plan.             Nutrition Assessments:  MEDIFICTS Score Key: ?70 Need to make dietary changes  40-70 Heart Healthy Diet ? 40 Therapeutic Level Cholesterol Diet  Flowsheet Row Cardiac Rehab from 02/14/2022 in [facility] Cardiac and Pulmonary Rehab  Picture Your Plate Total Score on Admission 67  Picture Your Plate Total Score on Discharge 71      Picture Your Plate Scores: <19 Unhealthy dietary pattern with much room for improvement. 41-50 Dietary pattern unlikely to meet recommendations for good health and room for improvement. 51-60 More healthful dietary pattern, with some room for improvement.  >60 Healthy dietary pattern, although there may be some specific behaviors that could be improved.    Nutrition Goals Re-Evaluation:  Nutrition Goals Re-Evaluation     [place] Name 02/07/22 1746             Goals   Current Weight 182 lb (82.6 kg)       Comment Chris states that she does not eat much. She eats her breakfast late to take with her medications. If she does not eat before 6pm for dinner she eats something light. She is going to try to do a little bit of snacking to help her metabolism.       Expected Outcome Short: eat more snacks. Long: maintain a diet that adheres to her needs.                Nutrition Goals Discharge (Final Nutrition Goals Re-Evaluation):  Nutrition Goals Re-Evaluation - 02/07/22 1746       Goals   Current Weight 182 lb (82.6 kg)    Comment Marika states that she does not eat much. She eats her breakfast late to take with her medications. If she does not eat before 6pm for dinner she eats something light. She is going to try to do a little bit of snacking to help her metabolism.    Expected Outcome  Short: eat more snacks. Long: maintain a diet that adheres to her needs.             Psychosocial: Target Goals: Acknowledge presence or absence of significant depression and/or stress, maximize coping skills, provide positive support system. Participant is able to verbalize types and ability to use techniques and skills needed for reducing stress and depression.   Education: Stress, Anxiety, and Depression - Group verbal and visual presentation to define topics covered.  Reviews how body is impacted by stress, anxiety, and depression.  Also discusses healthy ways to reduce stress and to treat/manage anxiety and depression.  Written material given at graduation. Flowsheet Row Cardiac Rehab from 02/16/2022 in [facility] Cardiac and Pulmonary Rehab  Date 01/12/22  Educator AS  Instruction Review Code 1- [employer] Understanding       Education: Sleep Hygiene -Provides  group verbal and written instruction about how sleep can affect your health.  Define sleep hygiene, discuss sleep cycles and impact of sleep habits. Review good sleep hygiene tips.    Initial Review & Psychosocial Screening:  Initial Psych Review & Screening - 11/15/21 1541       Initial Review   Current issues with None Identified      Family Dynamics   Good Support System? Yes   daughters     Barriers   Psychosocial barriers to participate in program There are no identifiable barriers or psychosocial needs.;The patient should benefit from training in stress management and relaxation.      Screening Interventions   Interventions Encouraged to exercise;To provide support and resources with identified psychosocial needs    Expected Outcomes Short Term goal: Utilizing psychosocial counselor, staff and physician to assist with identification of specific Stressors or current issues interfering with healing process. Setting desired goal for each stressor or current issue identified.;Long Term Goal: Stressors or current issues  are controlled or eliminated.;Short Term goal: Identification and review with participant of any Quality of Life or Depression concerns found by scoring the questionnaire.;Long Term goal: The participant improves quality of Life and PHQ9 Scores as seen by post scores and/or verbalization of changes             Quality of Life Scores:   Quality of Life - 02/14/22 1728       Quality of Life Scores   Health/Function Pre 26.57 %    Health/Function Post 25.37 %    Health/Function % Change -4.52 %    Socioeconomic Pre 30 %    Socioeconomic Post 29.69 %    Socioeconomic % Change  -1.03 %    Psych/Spiritual Pre 29.14 %    Psych/Spiritual Post 28.21 %    Psych/Spiritual % Change -3.19 %    Family Pre 30 %    Family Post 27 %    Family % Change -10 %    GLOBAL Pre 28.31 %    GLOBAL Post 27.16 %    GLOBAL % Change -4.06 %            Scores of 19 and below usually indicate a poorer quality of life in these areas.  A difference of  2-3 points is a clinically meaningful difference.  A difference of 2-3 points in the total score of the Quality of Life Index has been associated with significant improvement in overall quality of life, self-image, physical symptoms, and general health in studies assessing change in quality of life.  PHQ-9: Review Flowsheet  More data exists      02/14/2022 12/02/2021 09/05/2016 06/13/2016 03/07/2016  Depression screen PHQ 2/9  Decreased Interest 0 0 0 0 0  Down, Depressed, Hopeless 0 0 0 0 0  PHQ - 2 Score 0 0 0 0 0  Altered sleeping 0 0 - - -  Tired, decreased energy 0 1 - - -  Change in appetite 0 0 - - -  Feeling bad or failure about yourself  0 0 - - -  Trouble concentrating 0 0 - - -  Moving slowly or fidgety/restless 0 0 - - -  Suicidal thoughts 0 0 - - -  PHQ-9 Score 0 1 - - -  Difficult doing work/chores Not difficult at all Not difficult at all - - -   Interpretation of Total Score  Total Score Depression Severity:  1-4 = Minimal  depression, 5-9 = Mild depression,  10-14 = Moderate depression, 15-19 = Moderately severe depression, 20-27 = Severe depression   Psychosocial Evaluation and Intervention:  Psychosocial Evaluation - 11/15/21 1547       Psychosocial Evaluation & Interventions   Interventions Encouraged to exercise with the program and follow exercise prescription    Comments Ms. Marcin is coming to cardiac rehab after receiving a stent. She states she can feel a difference and has more energy. 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 She reports she has a great support system with her daughters and friends. She is looking forward to coming to cardiac rehab to develop a routine.    Expected Outcomes Short: attend cardiac rehab for education and exercise. Long: develop and maintain positive self care habits.    Continue Psychosocial Services  Follow up required by staff             Psychosocial Re-Evaluation:  Psychosocial Re-Evaluation     [place] Name 12/27/21 1747 02/07/22 1753           Psychosocial Re-Evaluation   Current issues with None Identified None Identified      Comments Patient reports no issues with their current mental states, sleep, stress, depression or anxiety. Will follow up with patient in a few weeks for any changes. Aphrodite talks to her grandkids on the phone that live in [place]. She goes to church routinely and sings in the church choir. She vaerbalizes that she has no depression or anxiety.      Expected Outcomes Short: Continue to exercise regularly to support mental health and notify staff of any changes. Long: maintain mental health and well being through teaching of rehab or prescribed medications independently. Short: maintain a healthy mental state. Long: continue to maintain a healthy state independently.      Interventions Encouraged to attend Cardiac Rehabilitation for the exercise  Encouraged to attend Cardiac Rehabilitation for the exercise      Continue Psychosocial Services  Follow up required by staff Follow up required by staff               Psychosocial Discharge (Final Psychosocial Re-Evaluation):  Psychosocial Re-Evaluation - 02/07/22 1753       Psychosocial Re-Evaluation   Current issues with None Identified    Comments Sharran talks to her grandkids on the phone that live in [place]. She goes to church routinely and sings in the church choir. She vaerbalizes that she has no depression or anxiety.    Expected Outcomes Short: maintain a healthy mental state. Long: continue to maintain a healthy state independently.    Interventions Encouraged to attend Cardiac Rehabilitation for the exercise    Continue Psychosocial Services  Follow up required by staff             Vocational Rehabilitation: Provide vocational rehab assistance to qualifying candidates.   Vocational Rehab Evaluation & Intervention:  Vocational Rehab - 11/15/21 1540       Initial Vocational Rehab Evaluation & Intervention   Assessment shows need for Vocational Rehabilitation No             Education: Education Goals: Education classes will be provided on a variety of topics geared toward better understanding of heart health and risk factor modification. Participant will state understanding/return demonstration of topics presented as noted by education test scores.  Learning Barriers/Preferences:  Learning Barriers/Preferences - 11/15/21 1540  Learning Barriers/Preferences   Learning Barriers None    Learning Preferences None             General Cardiac Education Topics:  AED/CPR: - Group verbal and written instruction with the use of models to demonstrate the basic use of the AED with the basic ABC's of resuscitation.   Anatomy and Cardiac Procedures: - Group verbal and visual presentation and models provide information about basic cardiac anatomy  and function. Reviews the testing methods done to diagnose heart disease and the outcomes of the test results. Describes the treatment choices: Medical Management, Angioplasty, or Coronary Bypass Surgery for treating various heart conditions including Myocardial Infarction, Angina, Valve Disease, and Cardiac Arrhythmias.  Written material given at graduation. Flowsheet Row Cardiac Rehab from 02/16/2022 in [facility] Cardiac and Pulmonary Rehab  Education need identified 12/02/21  Date 02/02/22  Educator SB  Instruction Review Code 1- Verbalizes Understanding       Medication Safety: - Group verbal and visual instruction to review commonly prescribed medications for heart and lung disease. Reviews the medication, class of the drug, and side effects. Includes the steps to properly store meds and maintain the prescription regimen.  Written material given at graduation. Flowsheet Row Cardiac Rehab from 02/16/2022 in [facility] Cardiac and Pulmonary Rehab  Date 12/22/21  Educator [facility]  Instruction Review Code 1- Verbalizes Understanding       Intimacy: - Group verbal instruction through game format to discuss how heart and lung disease can affect sexual intimacy. Written material given at graduation..   Know Your Numbers and Heart Failure: - Group verbal and visual instruction to discuss disease risk factors for cardiac and pulmonary disease and treatment options.  Reviews associated critical values for Overweight/Obesity, Hypertension, Cholesterol, and Diabetes.  Discusses basics of heart failure: signs/symptoms and treatments.  Introduces Heart Failure Zone chart for action plan for heart failure.  Written material given at graduation. Flowsheet Row Cardiac Rehab from 02/16/2022 in [facility] Cardiac and Pulmonary Rehab  Date 12/29/21  Educator [facility]  Instruction Review Code 1- Verbalizes Understanding       Infection Prevention: - Provides verbal and written material to individual with discussion of infection  control including proper hand washing and proper equipment cleaning during exercise session. Flowsheet Row Cardiac Rehab from 02/16/2022 in [facility] Cardiac and Pulmonary Rehab  Date 12/02/21  Educator AS  Instruction Review Code 1- Verbalizes Understanding       Falls Prevention: - Provides verbal and written material to individual with discussion of falls prevention and safety. Flowsheet Row Cardiac Rehab from 02/16/2022 in [facility] Cardiac and Pulmonary Rehab  Date 12/02/21  Educator AS  Instruction Review Code 1- Verbalizes Understanding       Other: -Provides group and verbal instruction on various topics (see comments)   Knowledge Questionnaire Score:  Knowledge Questionnaire Score - 02/14/22 1725       Knowledge Questionnaire Score   Pre Score 22/26    Post Score 23/26             Core Components/Risk Factors/Patient Goals at Admission:  Personal Goals and Risk Factors at Admission - 12/02/21 1655       Core Components/Risk Factors/Patient Goals on Admission    Weight Management Yes    Intervention Weight Management/Obesity: Establish reasonable short term and long term weight goals.    Admit Weight 180 lb 6.4 oz (81.8 kg)    Goal Weight: Short Term 175 lb (79.4 kg)    Goal Weight: Long Term 170  lb (77.1 kg)    Expected Outcomes Short Term: Continue to assess and modify interventions until short term weight is achieved;Long Term: Adherence to nutrition and physical activity/exercise program aimed toward attainment of established weight goal;Weight Loss: Understanding of general recommendations for a balanced deficit meal plan, which promotes 1-2 lb weight loss per week and includes a negative energy balance of [phone] kcal/d;Understanding recommendations for meals to include 15-35% energy as protein, 25-35% energy from fat, 35-60% energy from carbohydrates, less than 248m of dietary cholesterol, 20-35 gm of total fiber daily;Understanding of distribution of calorie  intake throughout the day with the consumption of 4-5 meals/snacks    Diabetes Yes    Intervention Provide education about signs/symptoms and action to take for hypo/hyperglycemia.;Provide education about proper nutrition, including hydration, and aerobic/resistive exercise prescription along with prescribed medications to achieve blood glucose in normal ranges: Fasting glucose 65-99 mg/dL    Expected Outcomes Short Term: Participant verbalizes understanding of the signs/symptoms and immediate care of hyper/hypoglycemia, proper foot care and importance of medication, aerobic/resistive exercise and nutrition plan for blood glucose control.;Long Term: Attainment of HbA1C < 7%.    Hypertension Yes    Intervention Provide education on lifestyle modifcations including regular physical activity/exercise, weight management, moderate sodium restriction and increased consumption of fresh fruit, vegetables, and low fat dairy, alcohol moderation, and smoking cessation.;Monitor prescription use compliance.    Lipids Yes    Intervention Provide education and support for participant on nutrition & aerobic/resistive exercise along with prescribed medications to achieve LDL <717m HDL >4025m   Expected Outcomes Short Term: Participant states understanding of desired cholesterol values and is compliant with medications prescribed. Participant is following exercise prescription and nutrition guidelines.;Long Term: Cholesterol controlled with medications as prescribed, with individualized exercise RX and with personalized nutrition plan. Value goals: LDL < 49m20mDL > 40 mg.             Education:Diabetes - Individual verbal and written instruction to review signs/symptoms of diabetes, desired ranges of glucose level fasting, after meals and with exercise. Acknowledge that pre and post exercise glucose checks will be done for 3 sessions at entry of program. Flow[facility]  Date 11/15/21  Educator [facility]- Verbalizes Understanding       Core Components/Risk Factors/Patient Goals Review:   Goals and Risk Factor Review     Row Name 12/27/21 1746 02/07/22 1743           Core Components/Risk Factors/Patient Goals Review   Personal Goals Review Hypertension;Diabetes;Weight Management/Obesity Weight Management/Obesity      Review DoriDomoniquechecking her blood pressure at home and her blood sugar. She has no questions about her medications and is doing well in the program. She continues to work on weight loss also. Deepika states that she is wanting to continue to lose weight. She has seen her cardiologist about two weeks ago and she stated that everything is looking good. Her weight is up two pounds from when she started the program. Iley blood sugars are doing well at home when she checks.      Expected Outcomes Short: lost more weight. Long: reach her weight goal. Short: lose 5 pounds in the next couple weeks. Long: reach weight goal.               Core Components/Risk Factors/Patient Goals at Discharge (Final Review):   Goals and Risk Factor Review - 02/07/22  1743       Core Components/Risk Factors/Patient Goals Review   Personal Goals Review Weight Management/Obesity    Review Blayke states that she is wanting to continue to lose weight. She has seen her cardiologist about two weeks ago and she stated that everything is looking good. Her weight is up two pounds from when she started the program. Tanyika blood sugars are doing well at home when she checks.    Expected Outcomes Short: lose 5 pounds in the next couple weeks. Long: reach weight goal.             ITP Comments:  ITP Comments     Row Name 11/15/21 1558 12/02/21 1657 12/06/21 1733 12/15/21 0844 12/20/21 1727   ITP Comments Initial telephone completed. Diagnosis can be found in [facility] 12/29. EP orientation scheduled for Thursday 3/9 at 3pm. Completed  6MWT and gym orientation. Initial ITP created and sent for review to Dr. Emily Filbert, Medical Director. First full day of exercise!  Patient was oriented to gym and equipment including functions, settings, policies, and procedures.  Patient's individual exercise prescription and treatment plan were reviewed.  All starting workloads were established based on the results of the 6 minute walk test done at initial orientation visit.  The plan for exercise progression was also introduced and progression will be customized based on patient's performance and goals. 30 Day review completed. Medical Director ITP review done, changes made as directed, and signed approval by Medical Director.   New to program Completed initial RD consultation    Row Name 01/12/22 1346 02/09/22 0858 03/09/22 1414       ITP Comments 30 Day review completed. Medical Director ITP review done, changes made as directed, and signed approval by Medical Director. 30 Day review completed. Medical Director ITP review done, changes made as directed, and signed approval by Medical Director. Discharged              Comments: Discharged

## 2022-03-15 ENCOUNTER — Other Ambulatory Visit: Payer: Self-pay

## 2022-03-15 MED FILL — Fluticasone Propionate Nasal Susp 50 MCG/ACT: NASAL | 60 days supply | Qty: 16 | Fill #0 | Status: AC

## 2022-03-24 ENCOUNTER — Other Ambulatory Visit: Payer: Self-pay | Admitting: Internal Medicine

## 2022-03-24 ENCOUNTER — Other Ambulatory Visit: Payer: Self-pay

## 2022-03-24 MED FILL — Amlodipine Besylate Tab 5 MG (Base Equivalent): ORAL | 30 days supply | Qty: 30 | Fill #0 | Status: AC

## 2022-03-30 ENCOUNTER — Other Ambulatory Visit: Payer: Self-pay

## 2022-04-03 ENCOUNTER — Other Ambulatory Visit: Payer: Self-pay

## 2022-04-04 ENCOUNTER — Other Ambulatory Visit: Payer: Self-pay

## 2022-04-16 ENCOUNTER — Other Ambulatory Visit: Payer: Self-pay

## 2022-04-16 ENCOUNTER — Encounter: Payer: Self-pay | Admitting: Emergency Medicine

## 2022-04-16 ENCOUNTER — Emergency Department
Admission: EM | Admit: 2022-04-16 | Discharge: 2022-04-16 | Disposition: A | Discharge status: Home / Self Care | Payer: No Typology Code available for payment source | Attending: Emergency Medicine | Admitting: Emergency Medicine

## 2022-04-16 ENCOUNTER — Emergency Department: Payer: No Typology Code available for payment source

## 2022-04-16 DIAGNOSIS — S134XXA Sprain of ligaments of cervical spine, initial encounter: Secondary | ICD-10-CM | POA: Insufficient documentation

## 2022-04-16 DIAGNOSIS — S199XXA Unspecified injury of neck, initial encounter: Secondary | ICD-10-CM | POA: Diagnosis present

## 2022-04-16 DIAGNOSIS — Y92410 Unspecified street and highway as the place of occurrence of the external cause: Secondary | ICD-10-CM | POA: Diagnosis not present

## 2022-04-16 MED ORDER — CYCLOBENZAPRINE HCL 10 MG PO TABS
10.0000 mg | ORAL_TABLET | Freq: Three times a day (TID) | ORAL | 0 refills | Status: DC | PRN
Start: 2022-04-16 — End: 2023-09-08

## 2022-04-16 MED ORDER — CYCLOBENZAPRINE HCL 10 MG PO TABS
10.0000 mg | ORAL_TABLET | Freq: Three times a day (TID) | ORAL | 0 refills | Status: DC | PRN
Start: 2022-04-16 — End: 2022-04-16
  Filled 2022-04-16: qty 21, 7d supply, fill #0

## 2022-04-16 MED ORDER — HYDROCODONE-ACETAMINOPHEN 5-325 MG PO TABS: 1.0000 | ORAL_TABLET | Freq: Four times a day (QID) | ORAL | 0 refills | Status: AC | PRN

## 2022-04-16 MED ORDER — ONDANSETRON 4 MG PO TBDP: 4.0000 mg | ORAL_TABLET | Freq: Once | ORAL | Status: DC

## 2022-04-16 MED ORDER — HYDROCODONE-ACETAMINOPHEN 5-325 MG PO TABS
2.0000 | ORAL_TABLET | Freq: Once | ORAL | Status: AC
  Administered 2022-04-16: 2 via ORAL
  Filled 2022-04-16: qty 2

## 2022-04-16 MED ORDER — HYDROCODONE-ACETAMINOPHEN 5-325 MG PO TABS
1.0000 | ORAL_TABLET | Freq: Four times a day (QID) | ORAL | 0 refills | Status: DC | PRN
Start: 2022-04-16 — End: 2022-04-16
  Filled 2022-04-16: qty 12, 2d supply, fill #0
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
## 2022-04-18 ENCOUNTER — Other Ambulatory Visit: Payer: Self-pay

## 2022-04-19 ENCOUNTER — Other Ambulatory Visit: Payer: Self-pay

## 2022-04-19 ENCOUNTER — Other Ambulatory Visit: Payer: Self-pay | Admitting: Internal Medicine

## 2022-04-19 MED ORDER — EZETIMIBE 10 MG PO TABS
10.0000 mg | ORAL_TABLET | Freq: Every day | ORAL | 2 refills | Status: DC
  Filled 2022-04-19: qty 90, 90d supply, fill #0
  Filled 2022-08-01: qty 90, 90d supply, fill #1
  Filled 2022-10-19: qty 90, 90d supply, fill #2

## 2022-04-19 MED ORDER — LISINOPRIL 10 MG PO TABS
10.0000 mg | ORAL_TABLET | Freq: Every day | ORAL | 2 refills | Status: DC
  Filled 2022-04-19: qty 90, 90d supply, fill #0
  Filled 2022-07-21: qty 90, 90d supply, fill #1
  Filled 2022-10-19: qty 90, 90d supply, fill #2

## 2022-04-19 MED ORDER — METHYLPREDNISOLONE 4 MG PO TBPK
ORAL_TABLET | ORAL | 0 refills | Status: DC
  Filled 2022-04-19: qty 21, 6d supply, fill #0

## 2022-04-19 MED ORDER — AMLODIPINE BESYLATE 5 MG PO TABS
5.0000 mg | ORAL_TABLET | Freq: Every day | ORAL | 2 refills | Status: DC
  Filled 2022-04-19: qty 90, 90d supply, fill #0
  Filled 2022-07-21: qty 90, 90d supply, fill #1
  Filled 2022-10-19: qty 90, 90d supply, fill #2

## 2022-04-19 MED ORDER — BUDESONIDE-FORMOTEROL FUMARATE 160-4.5 MCG/ACT IN AERO
INHALATION_SPRAY | RESPIRATORY_TRACT | 5 refills | Status: DC
  Filled 2022-04-19: qty 10.2, 30d supply, fill #0
  Filled 2022-07-07: qty 10.2, 30d supply, fill #1
  Filled 2022-08-01: qty 10.2, 30d supply, fill #2
  Filled 2022-11-16: qty 10.2, 30d supply, fill #3

## 2022-04-19 MED FILL — Tiotropium Bromide Monohydrate Inhal Aerosol 1.25 MCG/ACT: RESPIRATORY_TRACT | 30 days supply | Qty: 4 | Fill #1 | Status: AC

## 2022-04-20 ENCOUNTER — Other Ambulatory Visit: Payer: Self-pay

## 2022-04-20 MED ORDER — FERROUS SULFATE 325 (65 FE) MG PO TABS: ORAL_TABLET | ORAL | 2 refills | Status: DC

## 2022-04-21 ENCOUNTER — Other Ambulatory Visit: Payer: Self-pay

## 2022-04-22 ENCOUNTER — Other Ambulatory Visit: Payer: Self-pay

## 2022-04-22 ENCOUNTER — Ambulatory Visit: Payer: Medicare Other | Admitting: Internal Medicine

## 2022-04-22 ENCOUNTER — Encounter: Payer: Self-pay | Admitting: Internal Medicine

## 2022-04-22 DIAGNOSIS — T466X5A Adverse effect of antihyperlipidemic and antiarteriosclerotic drugs, initial encounter: Secondary | ICD-10-CM

## 2022-04-22 DIAGNOSIS — I10 Essential (primary) hypertension: Secondary | ICD-10-CM | POA: Diagnosis not present

## 2022-04-22 DIAGNOSIS — G720 Drug-induced myopathy: Secondary | ICD-10-CM | POA: Diagnosis not present

## 2022-04-22 DIAGNOSIS — E785 Hyperlipidemia, unspecified: Secondary | ICD-10-CM

## 2022-04-22 DIAGNOSIS — I25118 Atherosclerotic heart disease of native coronary artery with other forms of angina pectoris: Secondary | ICD-10-CM

## 2022-04-22 DIAGNOSIS — E1169 Type 2 diabetes mellitus with other specified complication: Secondary | ICD-10-CM

## 2022-04-22 NOTE — Patient Instructions (Signed)
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
## 2022-04-23 ENCOUNTER — Encounter: Payer: Self-pay | Admitting: Internal Medicine

## 2022-04-23 DIAGNOSIS — G720 Drug-induced myopathy: Secondary | ICD-10-CM | POA: Insufficient documentation

## 2022-04-25 NOTE — Addendum Note (Signed)
Addended by: Nestor Ramp on: 04/25/2022 11:50 AM   Modules accepted: Orders

## 2022-05-03 ENCOUNTER — Other Ambulatory Visit: Payer: Self-pay | Admitting: Internal Medicine

## 2022-05-04 ENCOUNTER — Other Ambulatory Visit: Payer: Self-pay

## 2022-05-04 MED ORDER — METOPROLOL TARTRATE 50 MG PO TABS
50.0000 mg | ORAL_TABLET | Freq: Two times a day (BID) | ORAL | 4 refills | Status: DC
  Filled 2022-05-04: qty 60, 30d supply, fill #0
  Filled 2022-06-05: qty 60, 30d supply, fill #1
  Filled 2022-07-07: qty 60, 30d supply, fill #2
  Filled 2022-08-01: qty 60, 30d supply, fill #3
  Filled 2022-08-18: qty 60, 30d supply, fill #4

## 2022-05-04 MED ORDER — ROSUVASTATIN CALCIUM 5 MG PO TABS
5.0000 mg | ORAL_TABLET | ORAL | 1 refills | Status: DC
  Filled 2022-05-04: qty 12, 28d supply, fill #0
  Filled 2022-05-10 – 2022-05-24 (×2): qty 12, 28d supply, fill #1

## 2022-05-06 ENCOUNTER — Other Ambulatory Visit: Payer: Self-pay

## 2022-05-06 MED ORDER — METHYLPREDNISOLONE 4 MG PO TBPK
ORAL_TABLET | ORAL | 0 refills | Status: DC
  Filled 2022-05-06: qty 21, 6d supply, fill #0

## 2022-05-06 MED FILL — Fluticasone Propionate Nasal Susp 50 MCG/ACT: NASAL | 60 days supply | Qty: 16 | Fill #1 | Status: AC

## 2022-05-10 ENCOUNTER — Other Ambulatory Visit: Payer: Self-pay

## 2022-05-24 ENCOUNTER — Other Ambulatory Visit: Payer: Self-pay

## 2022-05-24 MED ORDER — NORTRIPTYLINE HCL 10 MG PO CAPS
ORAL_CAPSULE | ORAL | 0 refills | Status: DC
  Filled 2022-05-24: qty 90, 60d supply, fill #0

## 2022-05-25 ENCOUNTER — Other Ambulatory Visit: Payer: Self-pay

## 2022-06-01 ENCOUNTER — Other Ambulatory Visit: Payer: Self-pay | Admitting: Family Medicine

## 2022-06-01 DIAGNOSIS — Z1231 Encounter for screening mammogram for malignant neoplasm of breast: Secondary | ICD-10-CM

## 2022-06-05 ENCOUNTER — Other Ambulatory Visit: Payer: Self-pay

## 2022-06-06 ENCOUNTER — Other Ambulatory Visit: Payer: Self-pay

## 2022-06-15 ENCOUNTER — Encounter: Payer: Self-pay | Admitting: Gastroenterology

## 2022-06-17 ENCOUNTER — Other Ambulatory Visit: Payer: Self-pay

## 2022-06-17 ENCOUNTER — Other Ambulatory Visit: Payer: Self-pay | Admitting: Internal Medicine

## 2022-06-17 MED ORDER — CLOPIDOGREL BISULFATE 75 MG PO TABS
ORAL_TABLET | ORAL | 2 refills | Status: DC
  Filled 2022-06-17: qty 30, 30d supply, fill #0
  Filled 2022-08-01: qty 30, 30d supply, fill #1
  Filled 2022-09-06: qty 30, 30d supply, fill #2

## 2022-06-28 ENCOUNTER — Other Ambulatory Visit: Payer: Self-pay | Admitting: Family Medicine

## 2022-06-28 ENCOUNTER — Other Ambulatory Visit: Payer: Self-pay

## 2022-06-28 ENCOUNTER — Telehealth: Payer: Self-pay | Admitting: Internal Medicine

## 2022-06-28 DIAGNOSIS — N644 Mastodynia: Secondary | ICD-10-CM

## 2022-06-28 DIAGNOSIS — R918 Other nonspecific abnormal finding of lung field: Secondary | ICD-10-CM

## 2022-06-28 NOTE — Telephone Encounter (Signed)
I have spoke with Mrs. Monica Oliver and her CT has been scheduled for 07/27/22 @ 4:30pm

## 2022-07-05 ENCOUNTER — Telehealth: Payer: Self-pay | Admitting: *Deleted

## 2022-07-05 NOTE — Telephone Encounter (Signed)
Patient called office stating about capsule study. It shows in patient's chart that patient is supposed to get the procedure done after the colonoscopy on 02/02/2022.  Monica Oliver will call patient regarding the capsule study. Patient verbalized understanding.

## 2022-07-07 ENCOUNTER — Other Ambulatory Visit: Payer: Self-pay | Admitting: Internal Medicine

## 2022-07-07 ENCOUNTER — Other Ambulatory Visit: Payer: Self-pay

## 2022-07-07 MED ORDER — ROSUVASTATIN CALCIUM 5 MG PO TABS
5.0000 mg | ORAL_TABLET | ORAL | 0 refills | Status: DC
  Filled 2022-07-07: qty 12, 28d supply, fill #0

## 2022-07-07 MED FILL — Tiotropium Bromide Monohydrate Inhal Aerosol 1.25 MCG/ACT: RESPIRATORY_TRACT | 30 days supply | Qty: 4 | Fill #2 | Status: AC

## 2022-07-07 NOTE — Telephone Encounter (Signed)
Left message on voicemail.

## 2022-07-14 ENCOUNTER — Ambulatory Visit
Admission: RE | Admit: 2022-07-14 | Discharge: 2022-07-14 | Disposition: A | Discharge status: Home / Self Care | Payer: Medicare Other | Source: Ambulatory Visit | Attending: Family Medicine | Admitting: Family Medicine

## 2022-07-14 DIAGNOSIS — N644 Mastodynia: Secondary | ICD-10-CM | POA: Diagnosis present

## 2022-07-22 ENCOUNTER — Other Ambulatory Visit: Payer: Self-pay

## 2022-07-22 MED FILL — Fluticasone Propionate Nasal Susp 50 MCG/ACT: NASAL | 60 days supply | Qty: 16 | Fill #2 | Status: AC

## 2022-07-27 ENCOUNTER — Ambulatory Visit
Admission: RE | Admit: 2022-07-27 | Discharge: 2022-07-27 | Disposition: A | Discharge status: Home / Self Care | Payer: Medicare Other | Source: Ambulatory Visit | Attending: Family Medicine | Admitting: Family Medicine

## 2022-07-27 DIAGNOSIS — R918 Other nonspecific abnormal finding of lung field: Secondary | ICD-10-CM | POA: Insufficient documentation

## 2022-07-28 ENCOUNTER — Other Ambulatory Visit: Payer: Self-pay

## 2022-07-28 DIAGNOSIS — J849 Interstitial pulmonary disease, unspecified: Secondary | ICD-10-CM

## 2022-07-29 NOTE — Telephone Encounter (Signed)
Unable to contact letter mailed.

## 2022-08-01 ENCOUNTER — Other Ambulatory Visit: Payer: Self-pay

## 2022-08-01 ENCOUNTER — Other Ambulatory Visit: Payer: Self-pay | Admitting: Internal Medicine

## 2022-08-01 MED ORDER — ROSUVASTATIN CALCIUM 5 MG PO TABS
5.0000 mg | ORAL_TABLET | ORAL | 0 refills | Status: DC
  Filled 2022-08-01: qty 12, 28d supply, fill #0

## 2022-08-02 ENCOUNTER — Other Ambulatory Visit: Payer: Self-pay

## 2022-08-19 ENCOUNTER — Other Ambulatory Visit: Payer: Self-pay

## 2022-08-22 ENCOUNTER — Other Ambulatory Visit: Payer: Self-pay

## 2022-08-24 ENCOUNTER — Other Ambulatory Visit: Payer: Self-pay

## 2022-08-24 MED ORDER — FUROSEMIDE 20 MG PO TABS
ORAL_TABLET | ORAL | 3 refills | Status: DC
  Filled 2022-08-24: qty 90, 90d supply, fill #0
  Filled 2022-11-25: qty 90, 90d supply, fill #1
  Filled 2023-03-06: qty 90, 90d supply, fill #2
  Filled 2023-07-06: qty 90, 90d supply, fill #3

## 2022-08-25 ENCOUNTER — Ambulatory Visit: Payer: Medicare Other | Attending: Internal Medicine

## 2022-08-25 DIAGNOSIS — R262 Difficulty in walking, not elsewhere classified: Secondary | ICD-10-CM | POA: Insufficient documentation

## 2022-08-25 DIAGNOSIS — F17210 Nicotine dependence, cigarettes, uncomplicated: Secondary | ICD-10-CM | POA: Insufficient documentation

## 2022-08-25 DIAGNOSIS — J849 Interstitial pulmonary disease, unspecified: Secondary | ICD-10-CM | POA: Insufficient documentation

## 2022-08-26 ENCOUNTER — Ambulatory Visit: Payer: Medicare Other | Admitting: Internal Medicine

## 2022-08-29 ENCOUNTER — Telehealth: Payer: Self-pay | Admitting: Internal Medicine

## 2022-08-29 DIAGNOSIS — G4733 Obstructive sleep apnea (adult) (pediatric): Secondary | ICD-10-CM

## 2022-08-29 NOTE — Telephone Encounter (Signed)
I have placed the order for a new CPAP machine. The patient is aware. Nothing further needed.

## 2022-08-29 NOTE — Telephone Encounter (Signed)
I spoke with the patient, she said she spoke with [employer] and they said the motor in her CPAP machine is going out. They told her she was due for an upgrade. She wants to know if we can send in a script for a new CPAP to [employer]?

## 2022-08-29 NOTE — Telephone Encounter (Signed)
LM for patient to return my call.

## 2022-08-29 NOTE — Telephone Encounter (Signed)
Patient is returning phone call. Patient phone number is [phone].

## 2022-08-30 ENCOUNTER — Other Ambulatory Visit: Payer: Self-pay

## 2022-08-30 ENCOUNTER — Other Ambulatory Visit: Payer: Self-pay | Admitting: Family Medicine

## 2022-08-30 DIAGNOSIS — Z87891 Personal history of nicotine dependence: Secondary | ICD-10-CM

## 2022-08-30 MED ORDER — D3-50 1.25 MG (50000 UT) PO CAPS
50000.0000 [IU] | ORAL_CAPSULE | ORAL | 0 refills | Status: AC
  Filled 2022-08-30: qty 8, 56d supply, fill #0

## 2022-09-06 ENCOUNTER — Other Ambulatory Visit: Payer: Self-pay

## 2022-09-06 ENCOUNTER — Other Ambulatory Visit: Payer: Self-pay | Admitting: Internal Medicine

## 2022-09-06 MED FILL — Rosuvastatin Calcium Tab 5 MG: ORAL | 28 days supply | Qty: 12 | Fill #0 | Status: AC

## 2022-09-06 MED FILL — Fluticasone Propionate Nasal Susp 50 MCG/ACT: NASAL | 60 days supply | Qty: 16 | Fill #3 | Status: AC

## 2022-09-07 ENCOUNTER — Other Ambulatory Visit: Payer: Self-pay

## 2022-09-20 ENCOUNTER — Other Ambulatory Visit: Payer: Self-pay

## 2022-09-20 ENCOUNTER — Other Ambulatory Visit: Payer: Self-pay | Admitting: Internal Medicine

## 2022-09-20 MED ORDER — METOPROLOL TARTRATE 50 MG PO TABS
50.0000 mg | ORAL_TABLET | Freq: Two times a day (BID) | ORAL | 1 refills | Status: DC
  Filled 2022-09-20: qty 60, 30d supply, fill #0
  Filled 2022-10-19: qty 60, 30d supply, fill #1

## 2022-09-30 ENCOUNTER — Other Ambulatory Visit: Payer: Self-pay

## 2022-09-30 ENCOUNTER — Other Ambulatory Visit: Payer: Self-pay | Admitting: Internal Medicine

## 2022-09-30 MED FILL — Tiotropium Bromide Monohydrate Inhal Aerosol 1.25 MCG/ACT: RESPIRATORY_TRACT | 30 days supply | Qty: 4 | Fill #3 | Status: AC

## 2022-10-03 ENCOUNTER — Other Ambulatory Visit: Payer: Self-pay

## 2022-10-03 MED ORDER — UNIFINE ULTRA PEN NEEDLE 31G X 6 MM MISC: 3 refills | Status: DC

## 2022-10-19 ENCOUNTER — Other Ambulatory Visit: Payer: Self-pay

## 2022-10-19 ENCOUNTER — Telehealth: Payer: Self-pay | Admitting: Internal Medicine

## 2022-10-19 MED ORDER — GUAIFENESIN ER 600 MG PO TB12: ORAL_TABLET | ORAL | 0 refills | Status: DC

## 2022-10-19 NOTE — Telephone Encounter (Signed)
Noted  

## 2022-10-19 NOTE — Telephone Encounter (Signed)
Lm for patient.

## 2022-10-19 NOTE — Telephone Encounter (Signed)
Spoke to patient. She is requesting CT results from 07/2022.  Dr. Mortimer Fries, please advise.

## 2022-10-20 ENCOUNTER — Other Ambulatory Visit: Payer: Self-pay | Admitting: Internal Medicine

## 2022-10-20 ENCOUNTER — Other Ambulatory Visit: Payer: Self-pay

## 2022-10-20 MED ORDER — ROSUVASTATIN CALCIUM 5 MG PO TABS
5.0000 mg | ORAL_TABLET | ORAL | 0 refills | Status: DC
  Filled 2022-10-20: qty 13, 31d supply, fill #0

## 2022-11-02 ENCOUNTER — Other Ambulatory Visit: Payer: Self-pay | Admitting: Internal Medicine

## 2022-11-02 ENCOUNTER — Other Ambulatory Visit: Payer: Self-pay

## 2022-11-02 MED ORDER — CLOPIDOGREL BISULFATE 75 MG PO TABS
ORAL_TABLET | ORAL | 2 refills | Status: DC
  Filled 2022-11-02: qty 30, 30d supply, fill #0
  Filled 2022-12-08: qty 30, 30d supply, fill #1
  Filled 2023-01-12: qty 30, 30d supply, fill #2

## 2022-11-10 ENCOUNTER — Encounter: Payer: Self-pay | Admitting: Internal Medicine

## 2022-11-10 ENCOUNTER — Ambulatory Visit: Payer: Medicare Other | Attending: Internal Medicine | Admitting: Internal Medicine

## 2022-11-10 ENCOUNTER — Ambulatory Visit: Payer: Medicare Other | Admitting: Internal Medicine

## 2022-11-10 VITALS — BP 132/70 | HR 80 | Temp 97.7°F | Ht 63.0 in | Wt 185.0 lb

## 2022-11-10 VITALS — BP 144/86 | HR 83 | Ht 63.0 in | Wt 184.5 lb

## 2022-11-10 DIAGNOSIS — E1169 Type 2 diabetes mellitus with other specified complication: Secondary | ICD-10-CM

## 2022-11-10 DIAGNOSIS — G4733 Obstructive sleep apnea (adult) (pediatric): Secondary | ICD-10-CM | POA: Diagnosis not present

## 2022-11-10 DIAGNOSIS — T466X5D Adverse effect of antihyperlipidemic and antiarteriosclerotic drugs, subsequent encounter: Secondary | ICD-10-CM

## 2022-11-10 DIAGNOSIS — I25118 Atherosclerotic heart disease of native coronary artery with other forms of angina pectoris: Secondary | ICD-10-CM

## 2022-11-10 DIAGNOSIS — F17210 Nicotine dependence, cigarettes, uncomplicated: Secondary | ICD-10-CM

## 2022-11-10 DIAGNOSIS — J449 Chronic obstructive pulmonary disease, unspecified: Secondary | ICD-10-CM | POA: Diagnosis not present

## 2022-11-10 DIAGNOSIS — R0789 Other chest pain: Secondary | ICD-10-CM

## 2022-11-10 DIAGNOSIS — I10 Essential (primary) hypertension: Secondary | ICD-10-CM

## 2022-11-10 DIAGNOSIS — G720 Drug-induced myopathy: Secondary | ICD-10-CM

## 2022-11-10 DIAGNOSIS — I25110 Atherosclerotic heart disease of native coronary artery with unstable angina pectoris: Secondary | ICD-10-CM

## 2022-11-10 DIAGNOSIS — E785 Hyperlipidemia, unspecified: Secondary | ICD-10-CM

## 2022-11-10 NOTE — Patient Instructions (Signed)
Medication Instructions:  Your Physician recommend you continue on your current medication as directed.    You may take tylenol or use topical cream such as Bengay for chest pain. If symptoms doesn't get better in a few days, please call our office.  *If you need a refill on your cardiac medications before your next appointment, please call your pharmacy*   Lab Work: None ordered today   Testing/Procedures: None ordered today   Follow-Up: At [facility], you and your health needs are our priority.  As part of our continuing mission to provide you with exceptional heart care, we have created designated Provider Care Teams.  These Care Teams include your primary Cardiologist (physician) and Advanced Practice Providers (APPs -  Physician Assistants and Nurse Practitioners) who all work together to provide you with the care you need, when you need it.  We recommend signing up for the patient portal called "MyChart".  Sign up information is provided on this After Visit Summary.  MyChart is used to connect with patients for Virtual Visits (Telemedicine).  Patients are able to view lab/test results, encounter notes, upcoming appointments, etc.  Non-urgent messages can be sent to your provider as well.   To learn more about what you can do with MyChart, go to [email or web address].    Your next appointment:   1 month(s)  Provider:   You may see [name], MD or one of the following Advanced Practice Providers on your designated Care Team:   Murray Hodgkins, NP Christell Faith, PA-C Cadence Kathlen Mody, PA-C Gerrie Nordmann, NP    Dr. Saunders Revel recommends checking and keeping a log of your blood pressure a few times throughout the week.   It is best to check your blood pressure 1-2 hours after taking your medications.  Below are some tips that our clinical pharmacists share for home BP monitoring:          Rest 10 minutes before taking your blood pressure.          Don't smoke or drink  caffeinated beverages for at least 30 minutes before.          Take your blood pressure before (not after) you eat.          Sit comfortably with your back supported and both feet on the floor (don't cross your legs).          Elevate your arm to heart level on a table or a desk.          Use the proper sized cuff. It should fit smoothly and snugly around your bare upper arm. There should be enough room to slip a fingertip under the cuff. The bottom edge of the cuff should be 1 inch above the crease of the elbow.

## 2022-11-10 NOTE — Progress Notes (Signed)
PULMONARY OFFICE FOLLOW UP NOTE  PROBLEMS:  Mild COPD Smoker  DATA: LDCT 06/07/16: normal PFTs 06/13/16: mild obstruction with borderline significant improvement after bronchodilator. Normal TLC. Mildly reduced DLCO  INTERVAL HISTORY: Previously seen by VM and diagnosed with mild COPD.    CC  Follow up COPD Follow Up Abnormal CT chest Follow up OSA      HPI Diagnosis of OSA Compliant with CPAP 8 cm water pressure Excellent compliance report  reviewed in detail with the patient She definitely benefits and use CPAP therapy nightly   COPD moderate 2022 FEV1  75% predicted 2023 FEV1  57% predicted I have reviewed findings with patient  No exacerbation at this time No evidence of heart failure at this time No evidence or signs of infection at this time No respiratory distress No fevers, chills, nausea, vomiting, diarrhea No evidence of lower extremity edema No evidence hemoptysis  Upper chest wall swelling Sore touch +costochondritis    Patient with previous history of bilateral lung nodules Previous CT chest July 08, 2020 Patient will need ongoing CT chest due to ongoing tobacco abuse 07/2022 CT chest did NOT show any nodules  Smoking Assessment and Cessation Counseling Upon further questioning, Patient smokes 1 ppd I have advised patient to quit/stop smoking as soon as possible due to high risk for multiple medical problems  Patientis NOT willing to quit smoking  I have advised patient that we can assist and have options of Nicotine replacement therapy. I also advised patient on behavioral therapy and can provide oral medication therapy in conjunction with the other therapies Follow up next Office visit  for assessment of smoking cessation Smoking cessation counseling advised for 4 minutes      BP 132/70 (BP Location: Left Arm, Cuff Size: Normal)   Pulse 80   Temp 97.7 F (36.5 C) (Temporal)   Ht 5' 3"$  (1.6 m)   Wt 185 lb (83.9 kg)   SpO2 98%    BMI 32.77 kg/m      Review of Systems: Gen:  Denies  fever, sweats, chills weight loss  HEENT: Denies blurred vision, double vision, ear pain, eye pain, hearing loss, nose bleeds, sore throat Cardiac:  No dizziness, chest pain or heaviness, chest tightness,edema, No JVD Resp:   No cough, -sputum production, -shortness of breath,-wheezing, -hemoptysis,  Other:  All other systems negative    Physical Examination:   General Appearance: No distress  EYES PERRLA, EOM intact.   NECK Supple, No JVD sore upper chest wall Pulmonary: normal breath sounds, No wheezing.  CardiovascularNormal S1,S2.  No m/r/g.   Abdomen: Benign, Soft, non-tender. ALL OTHER ROS ARE NEGATIVE     DATA:   CXR 09/24/16: vague left upper lobe opacity   CXR 11/10/16 : resolution of LUL opacity CT chest 07/16/2018-and discrete bilateral pulmonary nodules not visualized but reported Mild lymphadenopathy anterior tracheal space  CT chest 06/2021 No suspicious focal pulmonary nodules or infiltrates.  Stable subtle peribronchial nodularity within the lung apices bilaterally possibly representing changes of RB-ILD/smoking related lung disease.  CT scan results reviewed with patient in detail     ASSESSMENT AND PLAN  72 -year-old [place] female with underlying diagnosis of MODERATE  COPD with obstructive sleep apnea in setting of bilateral scattered pulmonary nodules in the setting of tobacco abuse setting of obesity and deconditioned state with probable related RB ILD   COPD moderate FEV1 57% predicted continue Symbicort as prescribed continue albuterol as prescribed no exacerbation at this time   OSA  excellent compliance report  continue CPAP 8 cm of water pressure AHI reduced to less than 1  patient uses and benefits from therapy    smoking cessation strongly advised  Obesity -recommend significant weight loss -recommend changing diet  Deconditioned state -Recommend increased daily activity and  exercise  Lung cancer screening program CT of the chest from October 2022 and 07/2022  reviewed in detail No suspicious focal nodules or infiltrates seen Findings reflect RB ILD smoking-related disease  Costachonditis-Motrin 800 mg TID  with food for next 7 days    MEDICATION ADJUSTMENTS/LABS AND TESTS ORDERED: Please STOP smoking continue CPAP as prescribed Keep up the great work! A+  Use Symbicort as prescribed and rinse out mouth after every use  Albuterol as needed  Avoid secondhand smoke Avoid SICK contacts Recommend  Masking  when appropriate Recommend Keep up-to-date with vaccinations  Costachonditis-Motrin   CURRENT MEDICATIONS REVIEWED AT LENGTH WITH PATIENT TODAY  Follow up in 1 year  Total time spent 35 mins   Bryam Taborda Patricia Pesa, M.D.  [facility]  Medical Director [place] Director [place] Department

## 2022-11-10 NOTE — Patient Instructions (Addendum)
Please STOP smoking continue CPAP as prescribed Keep up the great work! A+  Use Symbicort as prescribed and rinse out mouth after every use  Albuterol as needed  Avoid secondhand smoke Avoid SICK contacts Recommend  Masking  when appropriate Recommend Keep up-to-date with vaccinations  Costachonditis-Motrin 800 mg TID  with food for next 7 days
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
## 2022-11-11 ENCOUNTER — Encounter: Payer: Self-pay | Admitting: Internal Medicine

## 2022-11-16 ENCOUNTER — Other Ambulatory Visit: Payer: Self-pay

## 2022-11-16 ENCOUNTER — Other Ambulatory Visit: Payer: Self-pay | Admitting: Internal Medicine

## 2022-11-16 MED ORDER — METOPROLOL TARTRATE 50 MG PO TABS
50.0000 mg | ORAL_TABLET | Freq: Two times a day (BID) | ORAL | 1 refills | Status: DC
  Filled 2022-11-16: qty 60, 30d supply, fill #0
  Filled 2022-12-23: qty 60, 30d supply, fill #1

## 2022-11-16 MED FILL — Tiotropium Bromide Monohydrate Inhal Aerosol 1.25 MCG/ACT: RESPIRATORY_TRACT | 30 days supply | Qty: 4 | Fill #4 | Status: AC

## 2022-11-24 ENCOUNTER — Other Ambulatory Visit: Payer: Self-pay

## 2022-11-24 ENCOUNTER — Other Ambulatory Visit (HOSPITAL_BASED_OUTPATIENT_CLINIC_OR_DEPARTMENT_OTHER): Payer: Self-pay

## 2022-11-24 ENCOUNTER — Other Ambulatory Visit: Payer: Self-pay | Admitting: Internal Medicine

## 2022-11-24 MED ORDER — ROSUVASTATIN CALCIUM 5 MG PO TABS
5.0000 mg | ORAL_TABLET | ORAL | 0 refills | Status: DC
  Filled 2022-11-24 – 2022-11-25 (×2): qty 39, 91d supply, fill #0

## 2022-11-25 ENCOUNTER — Other Ambulatory Visit: Payer: Self-pay

## 2022-11-28 ENCOUNTER — Telehealth: Payer: Self-pay

## 2022-11-28 NOTE — Telephone Encounter (Signed)
We received a fax from [facility] confirming that this patient was set up with her CPAP machine on 11/28/2022. She will need an appointment between 4/4 and 6/2 to check her compliance for insurance purposes.  I have called and left a message for the patient to call our office back.

## 2022-11-29 NOTE — Telephone Encounter (Signed)
Lm x2 for patient.  Will close encounter per office protocol.   

## 2022-11-30 ENCOUNTER — Other Ambulatory Visit: Payer: Self-pay

## 2022-12-22 NOTE — Progress Notes (Signed)
Cardiology Office Note:    Date:  12/23/2022   ID:  Monica Oliver, DOB 09/17/1951, MRN [id number]  PCP:  Rejeana Brock, MD   [facility] Cardiologist:  [name], MD     Referring MD: Rejeana Brock, MD   CC: follow up for BP    History of Present Illness:    Monica Oliver is a 72 y.o. female with a hx of CAD (s/p DES to distal RCS for management of iatrogenic dissection 2022), hypertension, OSA on CPAP, COPD, DM 2, HLD with statin myopathy, IDA, QT prolongation.   She establish care with Dr. Saunders Revel on 08/09/2018 at the behest of her PCP for evaluation of chest pain.  The pain had been intermittent for some time radiating to her left shoulder and at least 1 time requiring an ED visit although the workup was unrevealing.  Coronary CTA was ordered which revealed single-vessel CAD with hemodynamically significant stenosis in rPL branch.   LHC on 12/29/20 revealed severe single-vessel coronary artery disease with 80 to 90% stenosis of the RPAV. There is mild, nonobstructive disease involving the mid RCA and ostium of D2. Medical optimization was recommended.   LHC on 09/24/21  unsuccessful PCI to RPAV due to resistance of the lesion and inability to cross the stenosis completely with a 1.5 x 10 mm balloon. Intervention was complicated by guide extension dissection of the distal RCA necessitating stent placement.   Most recently she was evaluated by Dr. And on 11/10/2022, at that time she reported soreness in her upper chest and neck area for the last several days that was different than the angina she had in the past.  She had unchanged exertional chest tightness.  Her blood pressure was elevated and her amlodipine was increased to 10 mg daily.  She presents today for follow up of her hypertension. Her BP today is well controlled at 126/78. She continues to note intermittent left upper arm chest soreness that comes and goes and is largely unchanged over the last few years.  She has been working out at [employer] three x's week, usually bikes for several miles, walks for several miles and does some weight training. She denies chest pain, palpitations, dyspnea, pnd, orthopnea, n, v, dizziness, syncope, edema, weight gain, or early satiety.   Past Medical History:  Diagnosis Date   Allergy    environmental   Arthritis    Asthma    CAD (coronary artery disease)    a. 08/2018 Cardiac CTA: CAC score 306 (91st %'ile). LM nl, LAD nl, LCX small w/ <50% mid stenosis. RI nl, RCA <50% mid, 51-69% distal (CTFFR of rPL 0.52); b. 12/2020 Cath: LM nl, LAD nl, D2 20, LCX nl, RCA 20p/m, RPAV 85. EF 55-65%-->Med Rx; c. 08/2021 PCI: LM nl, LAD nl D2 20, LCX nl, RCA 30p, 60d (2.5x12 Onyx Frontier DES), RPAV 90 (unsuccessful PTCA), EF 65%.   Claudication ([place])    a. 08/2019 ABI's nl.   COPD (chronic obstructive pulmonary disease) (HCC)    Diabetes mellitus without complication (HCC)    Diastolic dysfunction    a. 10/2018 Echo: EF 55-60%, Gr1 DD. Nl RV size/fxn.   GERD (gastroesophageal reflux disease)    Hyperlipidemia    Hypertension    Sleep apnea    C-Pap machine   Tobacco abuse     Past Surgical History:  Procedure Laterality Date   [place]  CHOLECYSTECTOMY     1993   COLONOSCOPY WITH PROPOFOL N/A 02/02/2021   Procedure: COLONOSCOPY WITH PROPOFOL;  Surgeon: [name], MD;  [facility] ENDOSCOPY;  Service: Endoscopy;  Laterality: N/A;   CORONARY STENT INTERVENTION N/A 09/24/2021   Procedure: CORONARY STENT INTERVENTION;  Surgeon: [name], MD;  Location: [place] [facility] CV LAB;  Service: Cardiovascular;  Laterality: N/A;   ESOPHAGOGASTRODUODENOSCOPY (EGD) WITH PROPOFOL N/A 02/02/2021   Procedure: ESOPHAGOGASTRODUODENOSCOPY (EGD) WITH PROPOFOL;  Surgeon: [name], MD;  Location: [facility] ENDOSCOPY;  Service: Endoscopy;  Laterality: N/A;   LEFT HEART CATH AND CORONARY ANGIOGRAPHY Left 12/29/2020   Procedure: LEFT HEART  CATH AND CORONARY ANGIOGRAPHY;  Surgeon: [name], MD;  Location: [facility] CV LAB;  Service: Cardiovascular;  Laterality: Left;   LEFT HEART CATH AND CORONARY ANGIOGRAPHY N/A 09/24/2021   Procedure: LEFT HEART CATH AND CORONARY ANGIOGRAPHY;  Surgeon: [name], MD;  Location: [facility] CV LAB;  Service: Cardiovascular;  Laterality: N/A;    Current Medications: Current Meds  Medication Sig   albuterol (PROVENTIL) (2.5 MG/3ML) 0.083% nebulizer solution TAKE 3 MLS (2.5 MG TOTAL) BY NEBULIZATION EVERY 6 HOURS AS NEEDED FOR WHEEZING OR SHORTNESS OF BREATH.   albuterol (VENTOLIN HFA) 108 (90 Base) MCG/ACT inhaler INHALE 2 PUFFS BY MOUTH EVERY 4 HOURS AS NEEDED   alendronate (FOSAMAX) 70 MG tablet Take 1 tablet by mouth once a week for bone strength. Take 30 min before other meds with full glass water   amLODipine (NORVASC) 5 MG tablet Take 1 tablet (5 mg total) by mouth daily.   aspirin 81 MG tablet Take 81 mg by mouth daily.   budesonide-formoterol (SYMBICORT) 160-4.5 MCG/ACT inhaler INHALE 2 PUFFS BY MOUTH TWO TIMES DAILY   cetirizine (ZYRTEC) 10 MG tablet Take 10 mg by mouth daily as needed for allergies.   Cholecalciferol (D3-50) 1.25 MG (50000 UT) capsule Take 1 capsule by mouth once a week   clopidogrel (PLAVIX) 75 MG tablet Take 1 tablet (75 mg total) by mouth daily with breakfast.   ezetimibe (ZETIA) 10 MG tablet Take 1 tablet (10 mg total) by mouth daily.   ferrous sulfate 325 (65 FE) MG tablet TAKE 1 TABLET BY MOUTH EVERY OTHER DAY FOR ANEMIA (SPANISH)   fluticasone (FLONASE) 50 MCG/ACT nasal spray Place 1 spray into both nostrils daily as needed for allergies or rhinitis.   furosemide (LASIX) 20 MG tablet Take 1 tablet by mouth once a day as needed for leg swelling, or weight gain of 3 pounds in 1 day or 5 pounds in a week   gabapentin (NEURONTIN) 300 MG capsule Take 1 capsule (300 mg total) by mouth 3 (three) times daily as needed for pain.   liraglutide (VICTOZA)  18 MG/3ML SOPN Inject 0.6 mg subcutaneously once a day for 1 week then increase to 1.2mg  daily for diabetes and heart health.   lisinopril (ZESTRIL) 10 MG tablet Take 1 tablet (10 mg total) by mouth daily.   metoprolol tartrate (LOPRESSOR) 50 MG tablet Take 1 tablet (50 mg total) by mouth 2 (two) times daily.   nitroGLYCERIN (NITROSTAT) 0.4 MG SL tablet Place 1 tablet (0.4 mg total) under the tongue every 5 (five) minutes as needed for chest pain. Maximum of 3 doses.   omeprazole (PRILOSEC) 20 MG capsule TAKE 1 CAPSULE BY MOUTH ONCE A DAY AS NEEDED FOR HEARTBURN   rosuvastatin (CRESTOR) 5 MG tablet Take 1 tablet (5 mg total) by mouth every Monday, Wednesday, and Friday. Please call [phone] to  schedule an appointment for further refills. Thank you.   Tiotropium Bromide Monohydrate (SPIRIVA RESPIMAT) 1.25 MCG/ACT AERS INHALE 2 PUFFS INTO THE LUNGS DAILY.     Allergies:   Other, Penicillamine, Penicillins, and Statins   Social History   Socioeconomic History   Marital status: Married    Spouse name: Not on file   Number of children: 3   Years of education: Not on file   Highest education level: Not on file  Occupational History   Occupation: CNA  Tobacco Use   Smoking status: Some Days    Packs/day: 0.75    Years: 40.00    Additional pack years: 0.00    Total pack years: 30.00    Types: Cigarettes    Start date: 12/15/1971   Smokeless tobacco: Never   Tobacco comments:    1 pack will last three weeks-02/22/2022  Vaping Use   Vaping Use: Former   Quit date: 12/16/2022  Substance and Sexual Activity   Alcohol use: No    Alcohol/week: 0.0 standard drinks of alcohol   Drug use: No   Sexual activity: Never  Other Topics Concern   Not on file  Social History Narrative   Not on file   Social Determinants of Health   Financial Resource Strain: Not on file  Food Insecurity: Not on file  Transportation Needs: Not on file  Physical Activity: Not on file  Stress: Not on file   Social Connections: Not on file     Family History: The patient's family history includes Breast cancer in her paternal aunt; Cancer in her brother, father, and mother; Cirrhosis in her sister; Heart attack (age of onset: 24) in her father; Hepatitis in her sister; Hyperlipidemia in her father; Hypertension in her mother.  ROS:   Please see the history of present illness.    All other systems reviewed and are negative.  EKGs/Labs/Other Studies Reviewed:    The following studies were reviewed today:  LHC/PCI (09/24/2021): Severe single-vessel CAD with 90% stenosis of proximal RPA V, similar to prior catheterization in 12/2020.  Vigorous LV contraction with LVEDP 25-30 mmHg.  Unsuccessful PCI to RPA V due to resistance of the lesion and inability to cross.  Intervention complicated by guide extension dissection of distal RCA necessitating stenting with Onyx Frontier 2.5 x 12 mm drug-eluting stent.  LHC (12/29/2020): Severe single-vessel coronary artery disease with 80-90% stenosis of rPAV.  Mild, nonobstructive disease involving mid RCA and ostium of D2.  Normal LVEF with mildly elevated LVEDP.  Cardiac CTA (09/10/18): CAC score 306 (91st percentile). LMCA normal. LAD normal. LCx relatively small with <50% stenosis in mid vessel. Small ramus normal. RCA with <50% stenosis in mid vessel. 51-69% stenosis in distal RCA/rPL (CTFFR of rPL 0.52).   EKG:  EKG is not ordered today.   Recent Labs: 12/23/2022: ALT 20; BUN 12; Creatinine, Ser 0.86; Hemoglobin 15.0; Platelets 303; Potassium 4.2; Sodium 138  Recent Lipid Panel    Component Value Date/Time   CHOL 136 12/23/2022 0842   CHOL 123 12/02/2021 1151   TRIG 187 (H) 12/23/2022 0842   HDL 45 12/23/2022 0842   HDL 45 12/02/2021 1151   CHOLHDL 3.0 12/23/2022 0842   VLDL 37 12/23/2022 0842   LDLCALC 54 12/23/2022 0842   LDLCALC 51 12/02/2021 1151     Risk Assessment/Calculations:                Physical Exam:    VS:  BP 126/78 (BP  Location: Left  Arm, Patient Position: Sitting, Cuff Size: Normal)   Pulse 80   Ht 5\' 3"  (1.6 m)   Wt 186 lb 4 oz (84.5 kg)   SpO2 97%   BMI 32.99 kg/m     Wt Readings from Last 3 Encounters:  12/23/22 186 lb 4 oz (84.5 kg)  11/10/22 184 lb 8 oz (83.7 kg)  11/10/22 185 lb (83.9 kg)     GEN:  Well nourished, well developed in no acute distress HEENT: Normal NECK: No JVD; No carotid bruits LYMPHATICS: No lymphadenopathy CARDIAC: RRR, no murmurs, rubs, gallops RESPIRATORY:  Clear to auscultation without rales or rhonchi. Light wheezing noted to LLL.  ABDOMEN: Soft, non-tender, non-distended MUSCULOSKELETAL:  No edema; No deformity  SKIN: Warm and dry NEUROLOGIC:  Alert and oriented x 3 PSYCHIATRIC:  Normal affect   ASSESSMENT:    1. Coronary artery disease of native artery of native heart with stable angina pectoris (HCC)   2. Chest wall pain   3. Essential hypertension   4. Hyperlipidemia associated with type 2 diabetes mellitus ([place])   5. OSA (obstructive sleep apnea)   6. Chronic obstructive pulmonary disease, unspecified COPD type ([place])    PLAN:    In order of problems listed above:  CAD - Severe single-vessel coronary artery disease with 80-90% stenosis of rPAV.  Mild, nonobstructive disease involving mid RCA and ostium of D2; s/p DES to distal RCS for management of iatrogenic dissection 2022. Stable with no anginal symptoms. No indication for ischemic evaluation.  Continue ASA, Plavix.  HTN - BP is controlled today at 126/78 after most recent increase . Continue current antihypertensive medication regimen.   HLD - LDL was 51 on 12/02/21. Will repeat FLP and LFTs today. Continue Zetia and Crestor.   OSA - uses nightly, follow with pulmonology.   COPD - follows with pulmonology, light expiratory wheezes noted, asymptomatic, SPO2 97% RA  Disposition - CMET, CBC, FLP today. Return in 6 months.           Medication Adjustments/Labs and Tests Ordered: Current  medicines are reviewed at length with the patient today.  Concerns regarding medicines are outlined above.  Orders Placed This Encounter  Procedures   Comp Met (CMET)   CBC   Lipid panel   No orders of the defined types were placed in this encounter.   Patient Instructions  Medication Instructions:  Your physician recommends that you continue on your current medications as directed. Please refer to the Current Medication list given to you today.  *If you need a refill on your cardiac medications before your next appointment, please call your pharmacy*   Lab Work: CMP, lipid panel, and CBC - Please go to the [facility]. You will check in at the front desk to the right as you walk into the atrium. Valet Parking is offered if needed. - No appointment needed. You may go any day between 7 am and 6 pm.  If you have labs (blood work) drawn today and your tests are completely normal, you will receive your results only by: [place] (if you have MyChart) OR A paper copy in the mail If you have any lab test that is abnormal or we need to change your treatment, we will call you to review the results.   Testing/Procedures: No testing ordered  Follow-Up: At [facility], you and your health needs are our priority.  As part of our continuing mission to provide you with exceptional heart care, we have  created designated Provider Care Teams.  These Care Teams include your primary Cardiologist (physician) and Advanced Practice Providers (APPs -  Physician Assistants and Nurse Practitioners) who all work together to provide you with the care you need, when you need it.  We recommend signing up for the patient portal called "MyChart".  Sign up information is provided on this After Visit Summary.  MyChart is used to connect with patients for Virtual Visits (Telemedicine).  Patients are able to view lab/test results, encounter notes, upcoming appointments, etc.  Non-urgent messages  can be sent to your provider as well.   To learn more about what you can do with MyChart, go to [email or web address].    Your next appointment:   6 month(s)  Provider:   [name], MD    Signed, Trudi Ida, NP  12/23/2022 9:23 AM    [place]

## 2022-12-23 ENCOUNTER — Other Ambulatory Visit
Admission: RE | Admit: 2022-12-23 | Discharge: 2022-12-23 | Disposition: A | Discharge status: Home / Self Care | Payer: Medicare Other | Source: Ambulatory Visit | Attending: Nurse Practitioner | Admitting: Nurse Practitioner

## 2022-12-23 ENCOUNTER — Ambulatory Visit: Payer: Medicare Other | Attending: Nurse Practitioner | Admitting: Cardiology

## 2022-12-23 ENCOUNTER — Encounter: Payer: Self-pay | Admitting: Nurse Practitioner

## 2022-12-23 ENCOUNTER — Other Ambulatory Visit: Payer: Self-pay

## 2022-12-23 VITALS — BP 126/78 | HR 80 | Ht 63.0 in | Wt 186.2 lb

## 2022-12-23 DIAGNOSIS — I10 Essential (primary) hypertension: Secondary | ICD-10-CM | POA: Insufficient documentation

## 2022-12-23 DIAGNOSIS — J449 Chronic obstructive pulmonary disease, unspecified: Secondary | ICD-10-CM

## 2022-12-23 DIAGNOSIS — I25118 Atherosclerotic heart disease of native coronary artery with other forms of angina pectoris: Secondary | ICD-10-CM | POA: Insufficient documentation

## 2022-12-23 DIAGNOSIS — R0789 Other chest pain: Secondary | ICD-10-CM

## 2022-12-23 DIAGNOSIS — G4733 Obstructive sleep apnea (adult) (pediatric): Secondary | ICD-10-CM

## 2022-12-23 DIAGNOSIS — E785 Hyperlipidemia, unspecified: Secondary | ICD-10-CM

## 2022-12-23 DIAGNOSIS — E1169 Type 2 diabetes mellitus with other specified complication: Secondary | ICD-10-CM | POA: Diagnosis not present

## 2022-12-23 LAB — COMPREHENSIVE METABOLIC PANEL
ALT: 20 U/L (ref 0–44)
AST: 27 U/L (ref 15–41)
Albumin: 4 g/dL (ref 3.5–5.0)
Alkaline Phosphatase: 65 U/L (ref 38–126)
Anion gap: 9 (ref 5–15)
BUN: 12 mg/dL (ref 8–23)
CO2: 28 mmol/L (ref 22–32)
Calcium: 9.1 mg/dL (ref 8.9–10.3)
Chloride: 101 mmol/L (ref 98–111)
Creatinine, Ser: 0.86 mg/dL (ref 0.44–1.00)
GFR, Estimated: >60 mL/min (ref >60)
Glucose, Bld: 111 mg/dL — ABNORMAL HIGH (ref 70–99)
Potassium: 4.2 mmol/L (ref 3.5–5.1)
Sodium: 138 mmol/L (ref 135–145)
Total Bilirubin: 0.7 mg/dL (ref 0.3–1.2)
Total Protein: 7.3 g/dL (ref 6.5–8.1)

## 2022-12-23 LAB — CBC
HCT: 46.2 % — ABNORMAL HIGH (ref 36.0–46.0)
Hemoglobin: 15 g/dL (ref 12.0–15.0)
MCH: 28.6 pg (ref 26.0–34.0)
MCHC: 32.5 g/dL (ref 30.0–36.0)
MCV: 88.2 fL (ref 80.0–100.0)
Platelets: 303 10*3/uL (ref 150–400)
RBC: 5.24 MIL/uL — ABNORMAL HIGH (ref 3.87–5.11)
RDW: 14.7 % (ref 11.5–15.5)
WBC: 7.5 10*3/uL (ref 4.0–10.5)
nRBC: 0 % (ref 0.0–0.2)

## 2022-12-23 LAB — LIPID PANEL
Cholesterol: 136 mg/dL (ref 0–200)
HDL: 45 mg/dL (ref >40)
LDL Cholesterol: 54 mg/dL (ref 0–99)
Total CHOL/HDL Ratio: 3 RATIO
Triglycerides: 187 mg/dL — ABNORMAL HIGH (ref <150)
VLDL: 37 mg/dL (ref 0–40)

## 2022-12-23 NOTE — Patient Instructions (Addendum)
Medication Instructions:  Your physician recommends that you continue on your current medications as directed. Please refer to the Current Medication list given to you today.  *If you need a refill on your cardiac medications before your next appointment, please call your pharmacy*   Lab Work: CMP, lipid panel, and CBC - Please go to the [facility]. You will check in at the front desk to the right as you walk into the atrium. Valet Parking is offered if needed. - No appointment needed. You may go any day between 7 am and 6 pm.  If you have labs (blood work) drawn today and your tests are completely normal, you will receive your results only by: [place] (if you have MyChart) OR A paper copy in the mail If you have any lab test that is abnormal or we need to change your treatment, we will call you to review the results.   Testing/Procedures: No testing ordered  Follow-Up: At [facility], you and your health needs are our priority.  As part of our continuing mission to provide you with exceptional heart care, we have created designated Provider Care Teams.  These Care Teams include your primary Cardiologist (physician) and Advanced Practice Providers (APPs -  Physician Assistants and Nurse Practitioners) who all work together to provide you with the care you need, when you need it.  We recommend signing up for the patient portal called "MyChart".  Sign up information is provided on this After Visit Summary.  MyChart is used to connect with patients for Virtual Visits (Telemedicine).  Patients are able to view lab/test results, encounter notes, upcoming appointments, etc.  Non-urgent messages can be sent to your provider as well.   To learn more about what you can do with MyChart, go to [email or web address].    Your next appointment:   6 month(s)  Provider:   [name], MD

## 2023-01-04 ENCOUNTER — Other Ambulatory Visit: Payer: Self-pay

## 2023-01-04 MED ORDER — VARENICLINE TARTRATE (STARTER) 0.5 MG X 11 & 1 MG X 42 PO TBPK
ORAL_TABLET | ORAL | 0 refills | Status: DC
  Filled 2023-01-04: qty 53, 28d supply, fill #0

## 2023-01-04 MED ORDER — VARENICLINE TARTRATE 1 MG PO TABS
1.0000 mg | ORAL_TABLET | Freq: Two times a day (BID) | ORAL | 1 refills | Status: DC
  Filled 2023-03-06: qty 56, 28d supply, fill #0
  Filled 2023-04-27: qty 56, 28d supply, fill #1

## 2023-01-05 ENCOUNTER — Ambulatory Visit: Payer: Medicare Other | Admitting: Internal Medicine

## 2023-01-05 ENCOUNTER — Other Ambulatory Visit: Payer: Self-pay

## 2023-01-05 MED ORDER — OZEMPIC (1 MG/DOSE) 4 MG/3ML ~~LOC~~ SOPN
1.0000 mg | PEN_INJECTOR | SUBCUTANEOUS | 12 refills | Status: DC
  Filled 2023-01-05: qty 3, 28d supply, fill #0
  Filled 2023-03-06: qty 3, 28d supply, fill #1
  Filled 2023-05-19 – 2023-05-26 (×2): qty 3, 28d supply, fill #2
  Filled 2023-06-27 (×2): qty 3, 28d supply, fill #3
  Filled 2023-07-26 – 2023-07-27 (×2): qty 3, 28d supply, fill #4
  Filled 2023-08-18: qty 3, 28d supply, fill #5

## 2023-01-05 MED FILL — Fluticasone Propionate Nasal Susp 50 MCG/ACT: NASAL | 60 days supply | Qty: 16 | Fill #4 | Status: AC

## 2023-01-06 ENCOUNTER — Other Ambulatory Visit: Payer: Self-pay

## 2023-01-11 ENCOUNTER — Other Ambulatory Visit: Payer: Self-pay | Admitting: Family Medicine

## 2023-01-11 DIAGNOSIS — R590 Localized enlarged lymph nodes: Secondary | ICD-10-CM

## 2023-01-17 ENCOUNTER — Ambulatory Visit
Admission: RE | Admit: 2023-01-17 | Discharge: 2023-01-17 | Disposition: A | Discharge status: Home / Self Care | Payer: Medicare Other | Source: Ambulatory Visit | Attending: Family Medicine | Admitting: Family Medicine

## 2023-01-17 DIAGNOSIS — R590 Localized enlarged lymph nodes: Secondary | ICD-10-CM | POA: Diagnosis present

## 2023-01-25 ENCOUNTER — Other Ambulatory Visit: Payer: Self-pay | Admitting: Internal Medicine

## 2023-01-25 ENCOUNTER — Other Ambulatory Visit: Payer: Self-pay

## 2023-01-25 MED ORDER — METOPROLOL TARTRATE 50 MG PO TABS
50.0000 mg | ORAL_TABLET | Freq: Two times a day (BID) | ORAL | 1 refills | Status: DC
  Filled 2023-01-25: qty 180, 90d supply, fill #0
  Filled 2023-04-27: qty 180, 90d supply, fill #1

## 2023-01-25 MED ORDER — LISINOPRIL 10 MG PO TABS
10.0000 mg | ORAL_TABLET | Freq: Every day | ORAL | 1 refills | Status: DC
  Filled 2023-01-25: qty 90, 90d supply, fill #0
  Filled 2023-04-27: qty 77, 77d supply, fill #1
  Filled 2023-04-27: qty 13, 13d supply, fill #1

## 2023-01-25 MED ORDER — AMLODIPINE BESYLATE 5 MG PO TABS
5.0000 mg | ORAL_TABLET | Freq: Every day | ORAL | 1 refills | Status: DC
  Filled 2023-01-25: qty 90, 90d supply, fill #0

## 2023-02-01 ENCOUNTER — Encounter: Payer: Self-pay | Admitting: Internal Medicine

## 2023-02-01 ENCOUNTER — Ambulatory Visit: Payer: Medicare Other | Admitting: Internal Medicine

## 2023-02-01 VITALS — BP 124/74 | HR 100 | Temp 97.7°F | Ht 63.0 in | Wt 186.6 lb

## 2023-02-01 DIAGNOSIS — F17210 Nicotine dependence, cigarettes, uncomplicated: Secondary | ICD-10-CM

## 2023-02-01 DIAGNOSIS — Z720 Tobacco use: Secondary | ICD-10-CM

## 2023-02-01 DIAGNOSIS — J449 Chronic obstructive pulmonary disease, unspecified: Secondary | ICD-10-CM | POA: Diagnosis not present

## 2023-02-01 DIAGNOSIS — G4733 Obstructive sleep apnea (adult) (pediatric): Secondary | ICD-10-CM

## 2023-02-01 MED ORDER — TRELEGY ELLIPTA 200-62.5-25 MCG/ACT IN AEPB: 1.0000 | INHALATION_SPRAY | Freq: Every day | RESPIRATORY_TRACT | 0 refills | Status: DC

## 2023-02-01 NOTE — Patient Instructions (Addendum)
Continue to STOP smoking continue CPAP as prescribed Keep up the great work! A+  Follow up Lung cancer screening protocol  LETS TRY TRELEGY 200 Inhaler instead of  Symbicort/Spiriva-lets see if this helps rinse out mouth after every use  Albuterol as needed Avoid secondhand smoke Avoid SICK contacts Recommend  Masking  when appropriate Recommend Keep up-to-date with vaccinations

## 2023-02-01 NOTE — Progress Notes (Signed)
PULMONARY OFFICE FOLLOW UP NOTE  PROBLEMS:  Mild COPD Smoker  DATA: LDCT 06/07/16: normal PFTs 06/13/16: mild obstruction with borderline significant improvement after bronchodilator. Normal TLC. Mildly reduced DLCO  INTERVAL HISTORY: Previously seen by VM and diagnosed with mild COPD.    CC  Follow up COPD Follow up OSA Follow-up abnormal CT chest pulmonary nodules    HPI Diagnosis of OSA Continue CPAP as prescribed 8 cm water pressure Excellent compliance report Reviewed in detail with patient She definitely benefits in use CPAP therapy nightly  Moderate COPD FEV1 57% predicted in 2023 Findings reviewed with patient  No exacerbation at this time No evidence of heart failure at this time No evidence or signs of infection at this time No respiratory distress No fevers, chills, nausea, vomiting, diarrhea No evidence of lower extremity edema No evidence hemoptysis   Patient with previous history of bilateral lung nodules Previous CT chest July 08, 2020 Patient will need ongoing CT chest due to ongoing tobacco abuse 07/2022 CT chest did NOT show any nodules  Smoking Assessment and Cessation Counseling Upon further questioning, Patient smokes 1 ppd I have advised patient to quit/stop smoking as soon as possible due to high risk for multiple medical problems  Patient is NOT willing to quit smoking  I have advised patient that we can assist and have options of Nicotine replacement therapy. I also advised patient on behavioral therapy and can provide oral medication therapy in conjunction with the other therapies Follow up next Office visit  for assessment of smoking cessation Smoking cessation counseling advised for 4 minutes   BP 124/74 (BP Location: Left Arm, Cuff Size: Normal)   Pulse 100   Temp 97.7 F (36.5 C) (Temporal)   Ht 5\' 3"  (1.6 m)   Wt 186 lb 9.6 oz (84.6 kg)   SpO2 95%   BMI 33.05 kg/m     Review of Systems: Gen:  Denies  fever, sweats,  chills weight loss  HEENT: Denies blurred vision, double vision, ear pain, eye pain, hearing loss, nose bleeds, sore throat Cardiac:  No dizziness, chest pain or heaviness, chest tightness,edema, No JVD Resp:   No cough, -sputum production, -shortness of breath,-wheezing, -hemoptysis,  Other:  All other systems negative   Physical Examination:   General Appearance: No distress  EYES PERRLA, EOM intact.   NECK Supple, No JVD Pulmonary: normal breath sounds, No wheezing.  CardiovascularNormal S1,S2.  No m/r/g.   Abdomen: Benign, Soft, non-tender. Neurology UE/LE 5/5 strength, no focal deficits Ext pulses intact, cap refill intact ALL OTHER ROS ARE NEGATIVE    DATA:   CXR 09/24/16: vague left upper lobe opacity   CXR 11/10/16 : resolution of LUL opacity CT chest 07/16/2018-and discrete bilateral pulmonary nodules not visualized but reported Mild lymphadenopathy anterior tracheal space  CT chest 06/2021 No suspicious focal pulmonary nodules or infiltrates.  Stable subtle peribronchial nodularity within the lung apices bilaterally possibly representing changes of RB-ILD/smoking related lung disease.  CT scan results reviewed with patient in detail     ASSESSMENT AND PLAN  72 year old pleasant female with underlying diagnosis of moderate COPD with obstructive sleep apnea in the setting of bilateral scattered pulmonary nodules in the setting of ongoing tobacco abuse with obesity and deconditioned state with probable underlying RB ILD   COPD No exacerbation at this time COPD moderate FEV1 57% predicted continue Symbicort as prescribed continue albuterol as prescribed    OSA excellent compliance report  Continue CPAP 8 AHI 0.6 Excellent compliance report  at 100% Patient uses and benefits from therapy  smoking cessation strongly advised  Obesity -recommend significant weight loss -recommend changing diet  Deconditioned state -Recommend increased daily activity and  exercise   Lung cancer screening program CT of the chest from October 2022 and 07/2022  reviewed in detail No suspicious focal nodules or infiltrates seen Findings reflect RB ILD smoking-related disease Follow Up lung cancer screening program   MEDICATION ADJUSTMENTS/LABS AND TESTS ORDERED: Continue to STOP smoking continue CPAP as prescribed Keep up the great work! A+  Follow up Lung cancer screening protocol  LETS TRY TRELEGY 200 Inhaler instead of  Symbicort/Spiriva-lets see if this helps rinse out mouth after every use  Albuterol as needed Avoid secondhand smoke Avoid SICK contacts Recommend  Masking  when appropriate Recommend Keep up-to-date with vaccinations  CURRENT MEDICATIONS REVIEWED AT LENGTH WITH PATIENT TODAY  Follow up in 1 year  Total time spent 25 mins    Ellie Spickler Santiago Glad, M.D.  [facility]  Medical Director [facility] Medical Director [facility] Cardio-Pulmonary Department

## 2023-02-16 ENCOUNTER — Other Ambulatory Visit: Payer: Self-pay | Admitting: Internal Medicine

## 2023-02-16 ENCOUNTER — Other Ambulatory Visit: Payer: Self-pay

## 2023-02-16 MED FILL — Ezetimibe Tab 10 MG: ORAL | 90 days supply | Qty: 90 | Fill #0 | Status: AC

## 2023-02-16 MED FILL — Clopidogrel Bisulfate Tab 75 MG (Base Equiv): ORAL | 30 days supply | Qty: 30 | Fill #0 | Status: AC

## 2023-02-21 ENCOUNTER — Other Ambulatory Visit: Payer: Self-pay

## 2023-03-06 ENCOUNTER — Other Ambulatory Visit: Payer: Self-pay | Admitting: Internal Medicine

## 2023-03-06 ENCOUNTER — Other Ambulatory Visit: Payer: Self-pay

## 2023-03-06 MED ORDER — ROSUVASTATIN CALCIUM 5 MG PO TABS
5.0000 mg | ORAL_TABLET | ORAL | 0 refills | Status: DC
  Filled 2023-03-06: qty 36, 84d supply, fill #0

## 2023-03-14 IMAGING — CR DG CHEST 2V
2 series · 2 of 2 positions shown · non-contrast
Comparison: 11/28/2017

CLINICAL DATA: Chest pain

EXAM:
CHEST - 2 VIEW

[chest pa]
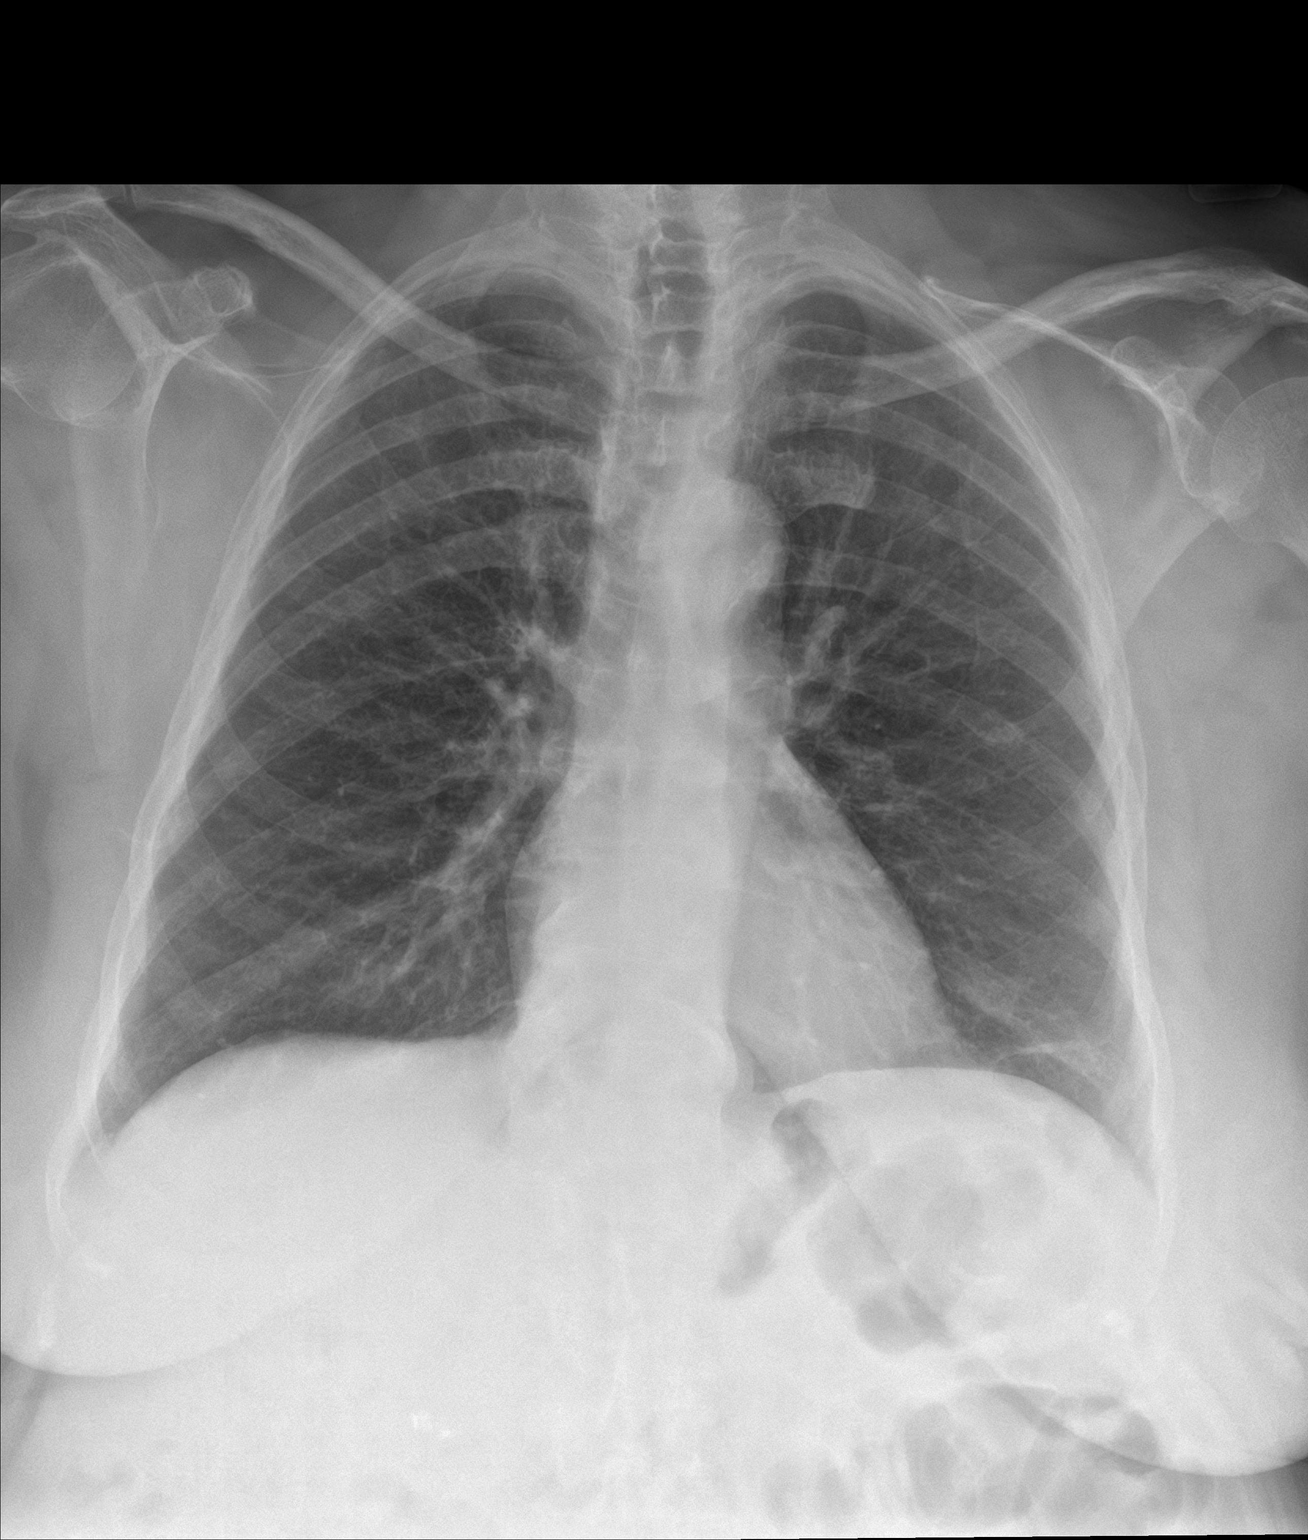

[chest lat]
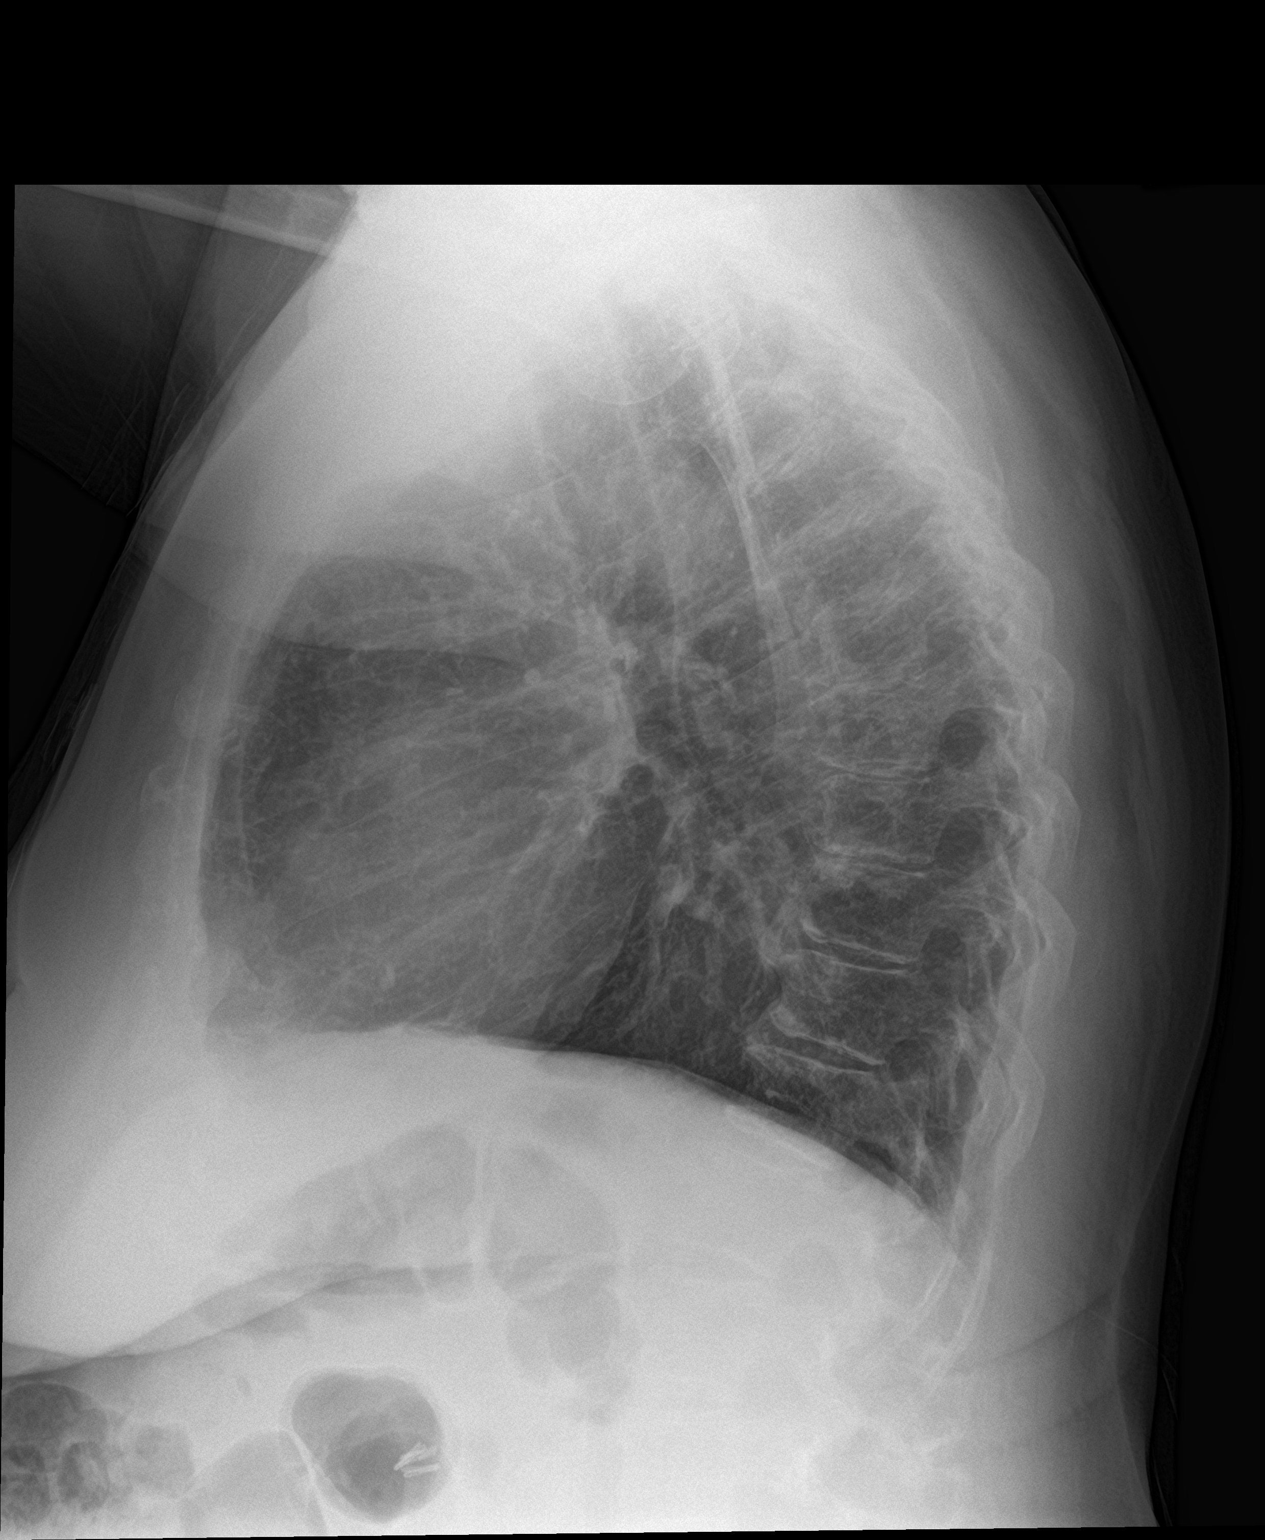

[2 of 2 positions shown; findings below may reference images not displayed]

FINDINGS: Minimal left basilar scarring. Lungs are otherwise clear. No
pneumothorax or pleural effusion. Cardiac size within normal limits.
Pulmonary vascularity is normal. No acute bone abnormality.
IMPRESSION: No active cardiopulmonary disease.

## 2023-03-20 ENCOUNTER — Other Ambulatory Visit: Payer: Self-pay

## 2023-03-20 MED ORDER — METHYLPREDNISOLONE 4 MG PO TBPK
ORAL_TABLET | ORAL | 0 refills | Status: DC
  Filled 2023-03-20: qty 21, 6d supply, fill #0

## 2023-03-22 IMAGING — CR DG CHEST 2V
1 series · 2 of 2 positions shown · non-contrast
Comparison: Two-view chest x-ray 09/15/2021

CLINICAL DATA: Chest pain over the last several days. Shortness of
breath.

EXAM:
CHEST - 2 VIEW

[Series 1: dg chest 2 view · 0.14mm/px · 2 of 2 slices shown]
[im 1/2]
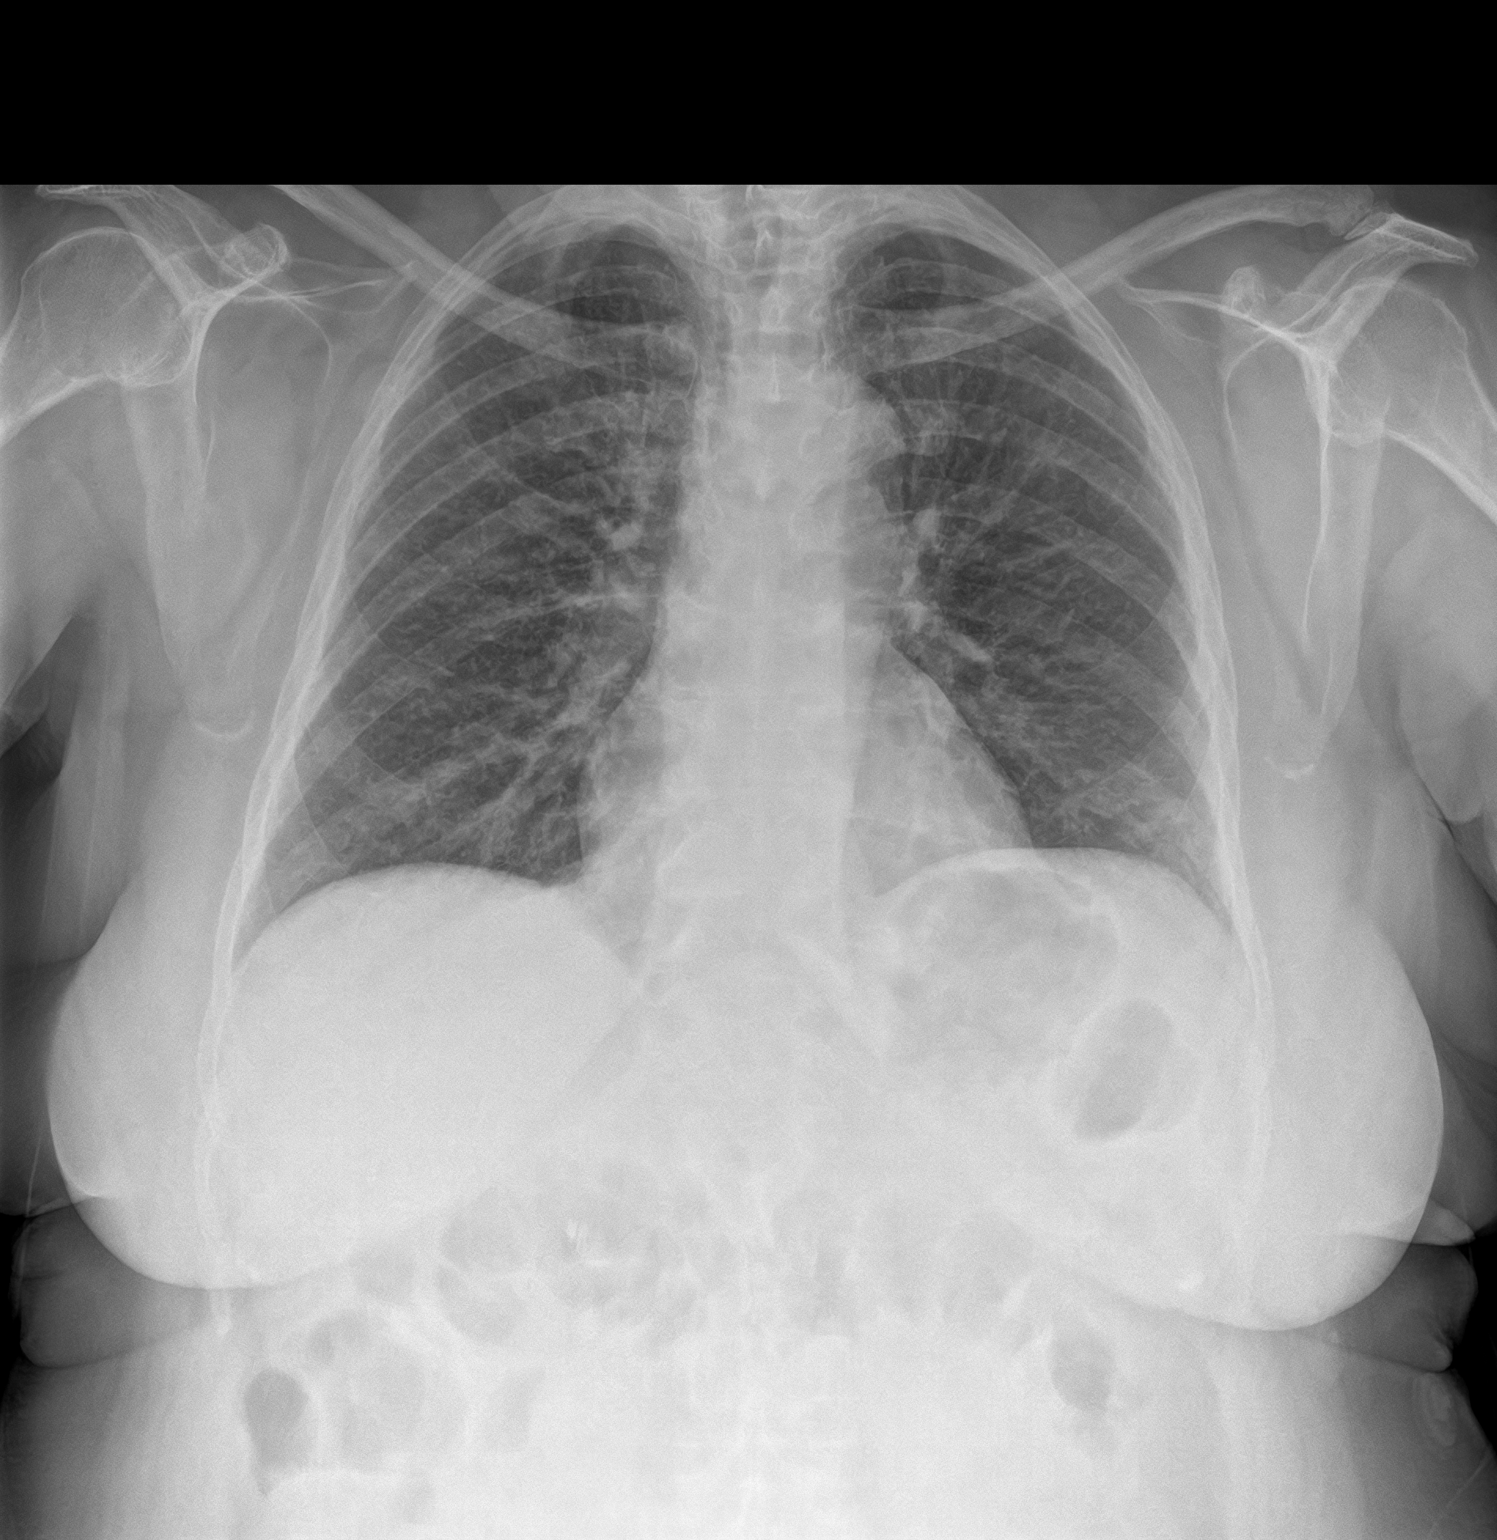
[im 2/2]
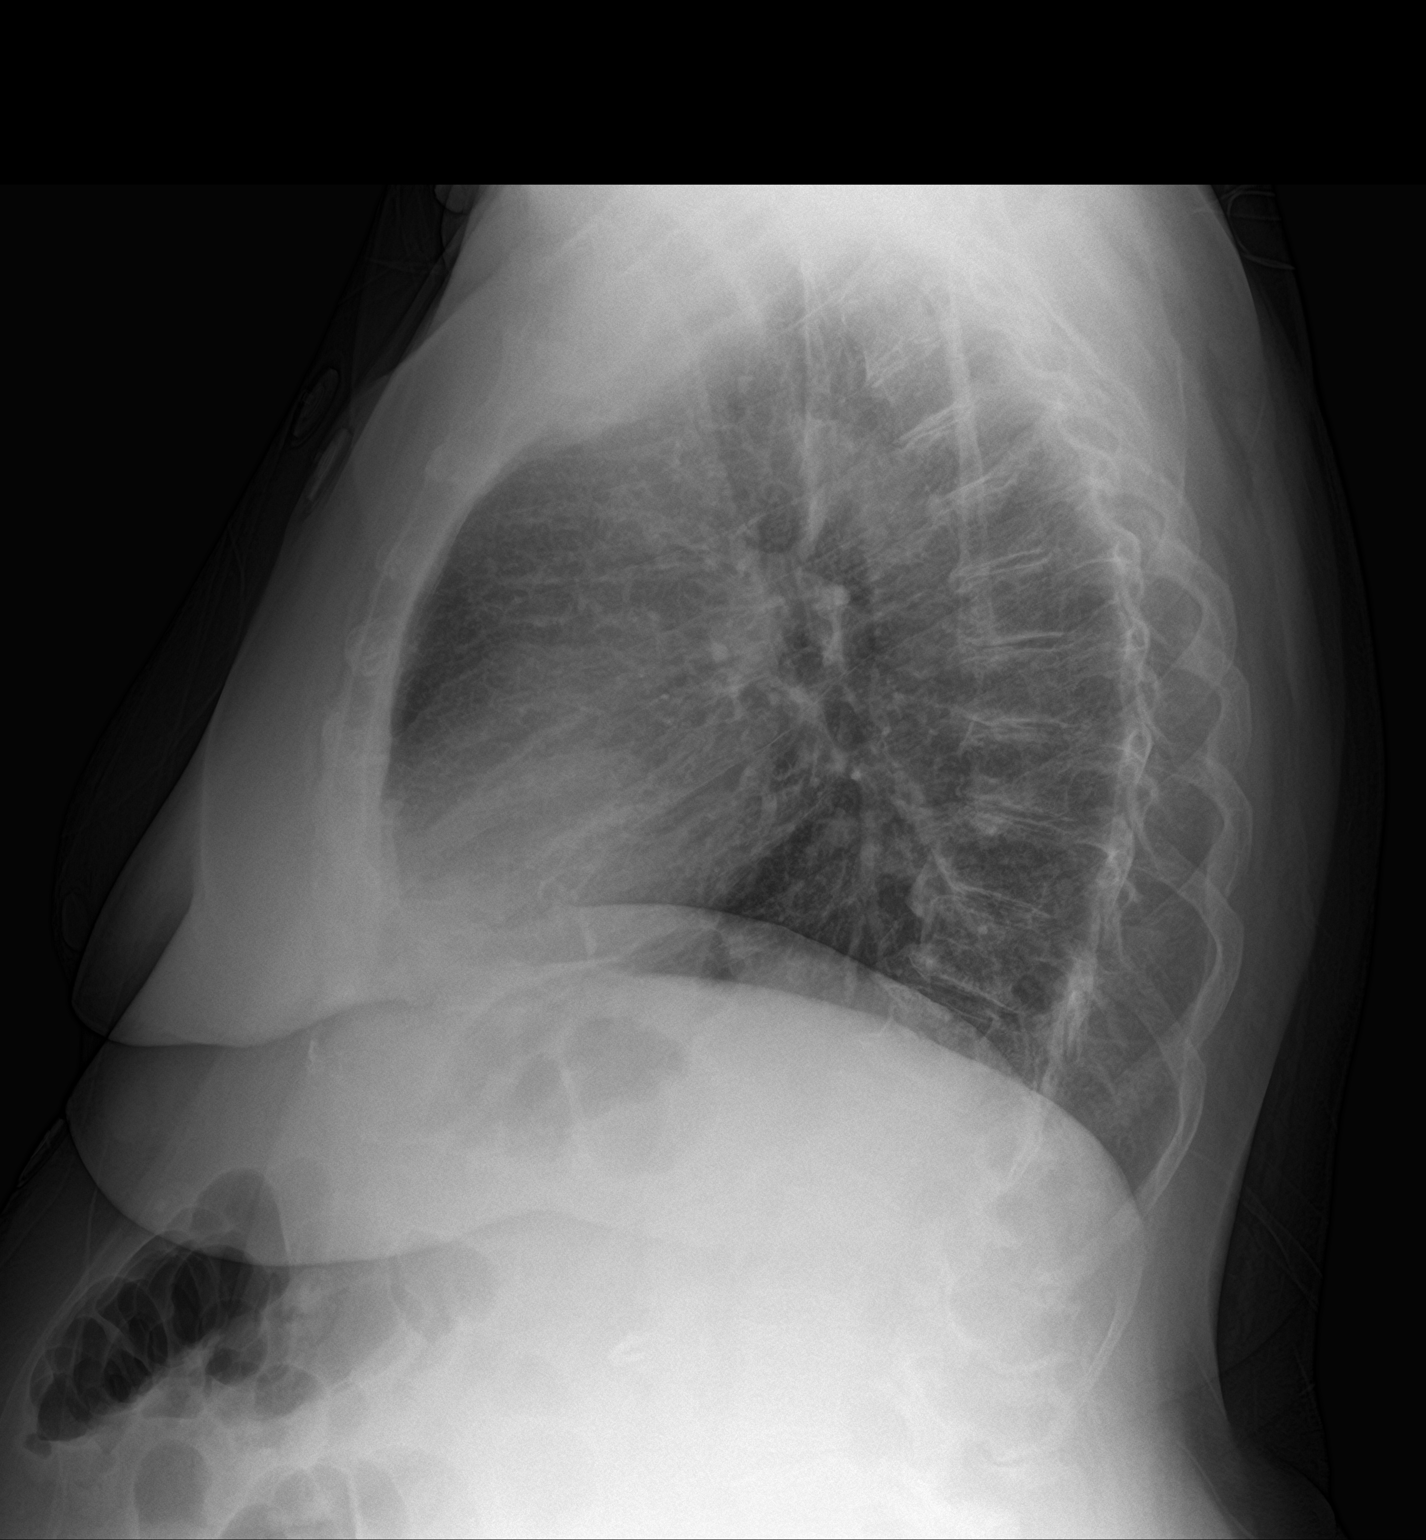

[2 of 2 positions shown; findings below may reference images not displayed]

FINDINGS: Heart size is normal. Lung volumes are low. Mild pulmonary vascular
congestion is noted. No significant edema or effusion is present.
Axial skeleton is otherwise unremarkable.
IMPRESSION: Low lung volumes and mild pulmonary vascular congestion.

## 2023-03-30 MED FILL — Clopidogrel Bisulfate Tab 75 MG (Base Equiv): ORAL | 30 days supply | Qty: 30 | Fill #1 | Status: AC

## 2023-03-31 ENCOUNTER — Other Ambulatory Visit: Payer: Self-pay

## 2023-03-31 ENCOUNTER — Other Ambulatory Visit: Payer: Self-pay | Admitting: Internal Medicine

## 2023-03-31 MED ORDER — TRELEGY ELLIPTA 200-62.5-25 MCG/ACT IN AEPB
1.0000 | INHALATION_SPRAY | Freq: Every day | RESPIRATORY_TRACT | 6 refills | Status: DC
  Filled 2023-03-31 – 2023-04-27 (×3): qty 60, 30d supply, fill #0
  Filled 2023-07-06 – 2023-12-08 (×3): qty 60, 30d supply, fill #1
  Filled 2024-01-16: qty 60, 30d supply, fill #2
  Filled 2024-03-11: qty 60, 30d supply, fill #3

## 2023-03-31 NOTE — Telephone Encounter (Signed)
Spoke to patient via telephone.  She felt that trelegy sample was effective. She is aware to stop spiriva and symbicort.  Trelegy sent to preferred pharmacy.

## 2023-04-03 ENCOUNTER — Other Ambulatory Visit: Payer: Self-pay

## 2023-04-11 ENCOUNTER — Encounter: Payer: Self-pay | Admitting: *Deleted

## 2023-04-14 ENCOUNTER — Other Ambulatory Visit: Payer: Self-pay

## 2023-04-19 ENCOUNTER — Other Ambulatory Visit: Payer: Self-pay

## 2023-04-19 ENCOUNTER — Ambulatory Visit: Payer: Medicare Other | Attending: Internal Medicine | Admitting: Internal Medicine

## 2023-04-19 ENCOUNTER — Encounter: Payer: Self-pay | Admitting: Internal Medicine

## 2023-04-19 VITALS — BP 144/80 | HR 81 | Ht 63.0 in | Wt 182.6 lb

## 2023-04-19 DIAGNOSIS — Z7985 Long-term (current) use of injectable non-insulin antidiabetic drugs: Secondary | ICD-10-CM

## 2023-04-19 DIAGNOSIS — R600 Localized edema: Secondary | ICD-10-CM

## 2023-04-19 DIAGNOSIS — E785 Hyperlipidemia, unspecified: Secondary | ICD-10-CM

## 2023-04-19 DIAGNOSIS — I25118 Atherosclerotic heart disease of native coronary artery with other forms of angina pectoris: Secondary | ICD-10-CM | POA: Diagnosis not present

## 2023-04-19 DIAGNOSIS — I10 Essential (primary) hypertension: Secondary | ICD-10-CM

## 2023-04-19 DIAGNOSIS — E1169 Type 2 diabetes mellitus with other specified complication: Secondary | ICD-10-CM

## 2023-04-19 MED ORDER — AMLODIPINE BESYLATE 10 MG PO TABS
10.0000 mg | ORAL_TABLET | Freq: Every day | ORAL | 3 refills | Status: DC
  Filled 2023-04-19: qty 90, 90d supply, fill #0
  Filled 2023-07-26: qty 90, 90d supply, fill #1

## 2023-04-19 NOTE — Progress Notes (Unsigned)
Cardiology Office Note:  .   Date:  04/20/2023  ID:  Monica Oliver, DOB 1950-12-23, MRN [id number] PCP: Katherina Right, MD  [facility] Providers Cardiologist:  Yvonne Kendall, MD     History of Present Illness: .   Monica Oliver is a 72 y.o. female with history of single-vessel coronary artery disease involving RPAV status post unsuccessful PCI due to resistant lesion necessitating drug-eluting stent placement to the distal RCA for management of iatrogenic dissection, hypertension, hyperlipidemia, type 2 diabetes mellitus, iron deficiency anemia, COPD, obstructive sleep apnea, shingles, and tobacco abuse, who presents for follow-up of coronary artery disease with stable angina.  I last saw her in February, at which time she complained of soreness in her upper chest and neck for 3 to 4 days, different than her typical angina.  She noted tenderness to palpation.  Chronic exertional angina walking uphill was similar to prior visits.  Was felt that her new upper chest and neck pain was musculoskeletal in etiology.  Conservative therapy and PCP follow-up were recommended.  She was subsequently evaluated by orthopedics with concern for adhesive capsulitis of the left shoulder.  Today, Monica Oliver reports that she is feeling fairly well though she still has pain in her left upper chest, shoulder, and neck.  It improved a little bit with injections to the left shoulder by Dr. Martha Clan.  However, it still remains tender to palpation.  She is also concerned that her COVID-19 shot over 6 months ago has caused her to have intermittent pain along the left biceps.  She sometimes feels like there is a lump there.  This just started a couple of weeks ago.  She also feels like her left shoulder and upper chest pain worsens with exertion.  It is somewhat different than her typical angina that she has had in the past.  That overall seems to be fairly well-controlled.  She notes mild lower extremity edema at times for  which she uses her as needed furosemide a couple of times a week.  She is currently trying to quit smoking, having been on Chantix for the last 2 weeks.  She is still vaping at this time.  ROS: See HPI  Studies Reviewed: Marland Kitchen   EKG Interpretation Date/Time:  Wednesday April 19 2023 11:07:02 EDT Ventricular Rate:  81 PR Interval:  170 QRS Duration:  62 QT Interval:  394 QTC Calculation: 457 R Axis:   -39  Text Interpretation: Normal sinus rhythm Left axis deviation When compared with ECG of 10-Nov-2022 No significant change was found Confirmed by Breon Rehm ([id number]) on 04/20/2023 7:28:19 AM    LHC/PCI (09/24/2021): Severe single-vessel coronary artery disease with 90% stenosis in the proximal RPAV branch, similar to prior catheterization in April. No significant CAD involving the left coronary artery. Vigorous left ventricular contraction with moderately elevated filling pressure (LVEF greater than 65%, LVEDP 25-30 mmHg). Unsuccessful PCI to RPAV due to resistance of the lesion and inability to cross the stenosis completely with a 1.5 x 10 mm balloon.  Intervention was complicated by guide extension dissection of the distal RCA necessitating stent placement.  Final angiogram shows stable 90% stenosis of the RPAV and complete resolution of distal RCA dissection with TIMI-3 flow.  Risk Assessment/Calculations:          Physical Exam:   VS:  BP (!) 144/80 (BP Location: Right Arm, Patient Position: Sitting, Cuff Size: Large)   Pulse 81   Ht 5\' 3"  (1.6 m)   Wt  182 lb 9.6 oz (82.8 kg)   SpO2 96%   BMI 32.35 kg/m    Wt Readings from Last 3 Encounters:  04/19/23 182 lb 9.6 oz (82.8 kg)  02/01/23 186 lb 9.6 oz (84.6 kg)  12/23/22 186 lb 4 oz (84.5 kg)    General:  NAD. Neck: No JVD or HJR. Lungs: Clear to auscultation bilaterally without wheezes or crackles. Heart: Regular rate and rhythm without murmurs, rubs, or gallops. Abdomen: Soft, nontender, nondistended. Extremities: Trace  ankle edema bilaterally.  ASSESSMENT AND PLAN: .    Coronary artery disease with stable angina: Monica Oliver has atypical chest pain that is likely driven in part by her left shoulder issues.  Tenderness to palpation would argue against worsening CAD, though she notes an exertional component as well.  We had previously recommended escalation of amlodipine for blood pressure and antianginal control, though this was never followed through on for unclear reasons.  We will therefore increase amlodipine to 10 mg daily.  She has been intolerant of long-acting nitrates in the past.  Until we have a better understanding of her chest pain progression, we will continue DAPT though it has been over a year since her PCI to the distal RCA.  Continue metoprolol 50 mg twice daily for blood pressure/angina therapy as well.  Given difficult to cross distal RCA lesion, I would favor reserving reattempted PCI for refractory symptoms that are clearly ischemic in nature.  I encouraged her to continue following up with Dr. Martha Clan in regard to her shoulder issues.  Hypertension: Blood pressure suboptimally controlled.  Increase amlodipine to 10 mg daily.  Continue current doses of metoprolol and losartan.  Leg edema: Minimal on exam today.  It may be exacerbated by escalation of amlodipine, which will need to monitor closely.  Continue as needed furosemide.  Hyperlipidemia associated with type 2 diabetes mellitus: Monica Oliver is tolerating low-dose rosuvastatin and ezetimibe well.  Myalgias have limited dose escalation of rosuvastatin in the past.  LDL at goal on last check in March.  Triglycerides mildly elevated at that time.  Continue current medications with ongoing lifestyle modifications to help improve triglycerides.     Dispo: Return to clinic in 3 months.  Signed, Yvonne Kendall, MD

## 2023-04-19 NOTE — Patient Instructions (Signed)
Medication Instructions:  Your physician recommends the following medication changes.  INCREASE: Amlodipine to 10 mg by mouth daily  *If you need a refill on your cardiac medications before your next appointment, please call your pharmacy*   Lab Work: None ordered today   Testing/Procedures: None ordered today   Follow-Up: At [facility], you and your health needs are our priority.  As part of our continuing mission to provide you with exceptional heart care, we have created designated Provider Care Teams.  These Care Teams include your primary Cardiologist (physician) and Advanced Practice Providers (APPs -  Physician Assistants and Nurse Practitioners) who all work together to provide you with the care you need, when you need it.  We recommend signing up for the patient portal called "MyChart".  Sign up information is provided on this After Visit Summary.  MyChart is used to connect with patients for Virtual Visits (Telemedicine).  Patients are able to view lab/test results, encounter notes, upcoming appointments, etc.  Non-urgent messages can be sent to your provider as well.   To learn more about what you can do with MyChart, go to [email or web address].    Your next appointment:   3 month(s)  Provider:   You may see Yvonne Kendall, MD or one of the following Advanced Practice Providers on your designated Care Team:   Nicolasa Ducking, NP Eula Listen, PA-C Cadence Fransico Michael, PA-C Charlsie Quest, NP

## 2023-04-20 ENCOUNTER — Encounter: Payer: Self-pay | Admitting: Internal Medicine

## 2023-04-20 DIAGNOSIS — R600 Localized edema: Secondary | ICD-10-CM | POA: Insufficient documentation

## 2023-04-27 ENCOUNTER — Other Ambulatory Visit: Payer: Self-pay

## 2023-04-27 MED FILL — Clopidogrel Bisulfate Tab 75 MG (Base Equiv): ORAL | 30 days supply | Qty: 30 | Fill #2 | Status: AC

## 2023-04-28 ENCOUNTER — Other Ambulatory Visit: Payer: Self-pay

## 2023-04-30 ENCOUNTER — Other Ambulatory Visit: Payer: Self-pay

## 2023-05-01 ENCOUNTER — Other Ambulatory Visit: Payer: Self-pay

## 2023-05-11 ENCOUNTER — Other Ambulatory Visit: Payer: Self-pay

## 2023-05-11 ENCOUNTER — Other Ambulatory Visit: Payer: Self-pay | Admitting: Internal Medicine

## 2023-05-11 MED FILL — Fluticasone Propionate Nasal Susp 50 MCG/ACT: NASAL | 30 days supply | Qty: 16 | Fill #0 | Status: AC

## 2023-05-12 ENCOUNTER — Other Ambulatory Visit: Payer: Self-pay

## 2023-05-12 MED ORDER — GABAPENTIN 300 MG PO CAPS
300.0000 mg | ORAL_CAPSULE | Freq: Three times a day (TID) | ORAL | 0 refills | Status: AC
  Filled 2023-05-12: qty 90, 30d supply, fill #0

## 2023-05-19 ENCOUNTER — Other Ambulatory Visit: Payer: Self-pay

## 2023-05-22 ENCOUNTER — Other Ambulatory Visit: Payer: Self-pay

## 2023-05-23 ENCOUNTER — Other Ambulatory Visit: Payer: Self-pay

## 2023-05-25 ENCOUNTER — Other Ambulatory Visit: Payer: Self-pay

## 2023-05-25 ENCOUNTER — Other Ambulatory Visit: Payer: Self-pay | Admitting: Internal Medicine

## 2023-05-25 MED ORDER — ROSUVASTATIN CALCIUM 5 MG PO TABS
5.0000 mg | ORAL_TABLET | ORAL | 0 refills | Status: DC
  Filled 2023-05-25: qty 36, 84d supply, fill #0

## 2023-05-25 MED ORDER — EZETIMIBE 10 MG PO TABS
10.0000 mg | ORAL_TABLET | Freq: Every day | ORAL | 0 refills | Status: DC
  Filled 2023-05-25: qty 90, 90d supply, fill #0

## 2023-05-26 ENCOUNTER — Other Ambulatory Visit: Payer: Self-pay

## 2023-05-26 MED ORDER — XIIDRA 5 % OP SOLN
1.0000 [drp] | Freq: Two times a day (BID) | OPHTHALMIC | 4 refills | Status: AC
  Filled 2023-05-26: qty 180, 90d supply, fill #0
  Filled 2023-06-12: qty 60, 30d supply, fill #0

## 2023-06-09 ENCOUNTER — Other Ambulatory Visit: Payer: Self-pay

## 2023-06-12 ENCOUNTER — Other Ambulatory Visit: Payer: Self-pay

## 2023-06-22 ENCOUNTER — Ambulatory Visit: Payer: Medicare Other | Admitting: Internal Medicine

## 2023-06-27 ENCOUNTER — Other Ambulatory Visit: Payer: Self-pay

## 2023-06-28 ENCOUNTER — Other Ambulatory Visit: Payer: Self-pay

## 2023-06-29 ENCOUNTER — Other Ambulatory Visit: Payer: Self-pay

## 2023-06-29 MED ORDER — DEXCOM G7 SENSOR MISC
3 refills | Status: DC
  Filled 2023-06-29 (×2): qty 9, 90d supply, fill #0
  Filled 2023-07-03 – 2023-07-20 (×3): qty 9, 84d supply, fill #0

## 2023-06-29 MED ORDER — DEXCOM G7 RECEIVER DEVI
0 refills | Status: DC
  Filled 2023-06-29: qty 1, 90d supply, fill #0

## 2023-07-03 ENCOUNTER — Other Ambulatory Visit: Payer: Self-pay

## 2023-07-06 ENCOUNTER — Other Ambulatory Visit: Payer: Self-pay

## 2023-07-06 MED FILL — Clopidogrel Bisulfate Tab 75 MG (Base Equiv): ORAL | 30 days supply | Qty: 30 | Fill #3 | Status: AC

## 2023-07-07 ENCOUNTER — Other Ambulatory Visit: Payer: Self-pay

## 2023-07-07 ENCOUNTER — Telehealth: Payer: Self-pay | Admitting: Internal Medicine

## 2023-07-07 DIAGNOSIS — R918 Other nonspecific abnormal finding of lung field: Secondary | ICD-10-CM

## 2023-07-07 DIAGNOSIS — Z720 Tobacco use: Secondary | ICD-10-CM

## 2023-07-07 DIAGNOSIS — J449 Chronic obstructive pulmonary disease, unspecified: Secondary | ICD-10-CM

## 2023-07-07 NOTE — Telephone Encounter (Signed)
Patient calling wants to know if she is due for a CT scan, states she usually gets one yearly. Did not see anything about another CT scan. Patient also states she is having shoulder pain and wants to know if it can be related to her breathing. Please advise

## 2023-07-07 NOTE — Telephone Encounter (Signed)
I spoke with the patient. She normally has a CT done every year but she is not scheduled for one this year. Your last note mentions her following up with the Lung Cancer Screening Program but she is not currently enrolled in one. You have ordered the last 2 Cts for her. Do you want to order the CT?

## 2023-07-10 ENCOUNTER — Other Ambulatory Visit: Payer: Self-pay

## 2023-07-10 NOTE — Telephone Encounter (Signed)
I have placed a referral to the Lung Cancer Screening Program. Lm x1 for the patient.

## 2023-07-10 NOTE — Telephone Encounter (Signed)
Advised patient of referral for LCS program. Also patient would like samples of Trelegy. Patient phone number is [phone].

## 2023-07-10 NOTE — Telephone Encounter (Signed)
I spoke with the patient and told her we are currently out of Trelegy samples. I advised her to call back in a week or 2 to check and see if we have received any.  Nothing further needed.

## 2023-07-13 ENCOUNTER — Other Ambulatory Visit: Payer: Self-pay | Admitting: Internal Medicine

## 2023-07-13 ENCOUNTER — Other Ambulatory Visit: Payer: Self-pay

## 2023-07-13 MED ORDER — METHYLPREDNISOLONE 4 MG PO TBPK
ORAL_TABLET | ORAL | 0 refills | Status: DC
  Filled 2023-07-13: qty 21, 6d supply, fill #0

## 2023-07-14 ENCOUNTER — Other Ambulatory Visit: Payer: Self-pay | Admitting: Internal Medicine

## 2023-07-14 ENCOUNTER — Other Ambulatory Visit: Payer: Self-pay

## 2023-07-14 MED FILL — Lisinopril Tab 10 MG: ORAL | 90 days supply | Qty: 90 | Fill #0 | Status: CN

## 2023-07-20 ENCOUNTER — Other Ambulatory Visit: Payer: Self-pay

## 2023-07-20 ENCOUNTER — Encounter: Payer: Self-pay | Admitting: Internal Medicine

## 2023-07-20 ENCOUNTER — Ambulatory Visit: Payer: Medicare Other | Attending: Internal Medicine | Admitting: Internal Medicine

## 2023-07-20 VITALS — BP 136/74 | HR 81 | Ht 63.0 in | Wt 182.1 lb

## 2023-07-20 DIAGNOSIS — I10 Essential (primary) hypertension: Secondary | ICD-10-CM | POA: Diagnosis not present

## 2023-07-20 DIAGNOSIS — G8929 Other chronic pain: Secondary | ICD-10-CM

## 2023-07-20 DIAGNOSIS — M25512 Pain in left shoulder: Secondary | ICD-10-CM | POA: Diagnosis not present

## 2023-07-20 DIAGNOSIS — I25118 Atherosclerotic heart disease of native coronary artery with other forms of angina pectoris: Secondary | ICD-10-CM | POA: Diagnosis not present

## 2023-07-20 DIAGNOSIS — E1169 Type 2 diabetes mellitus with other specified complication: Secondary | ICD-10-CM

## 2023-07-20 DIAGNOSIS — E785 Hyperlipidemia, unspecified: Secondary | ICD-10-CM

## 2023-07-20 MED ORDER — LISINOPRIL 20 MG PO TABS
20.0000 mg | ORAL_TABLET | Freq: Every day | ORAL | 3 refills | Status: DC
  Filled 2023-07-20: qty 90, 90d supply, fill #0

## 2023-07-20 MED ORDER — NITROGLYCERIN 0.4 MG SL SUBL
0.4000 mg | SUBLINGUAL_TABLET | SUBLINGUAL | 3 refills | Status: DC | PRN
  Filled 2023-07-20: qty 25, 25d supply, fill #0

## 2023-07-20 NOTE — Patient Instructions (Addendum)
Medication Instructions:  Your physician recommends the following medication changes.  INCREASE: Lisinopril to 20 mg by mouth daily   *If you need a refill on your cardiac medications before your next appointment, please call your pharmacy*   Lab Work: Your provider would like for you to return in 2 weeks to have the following labs drawn: (BMP).   Please go to [facility] [address] ([facility] [address], [place] [address] You do not need an appointment.  They are open from 7:30 am-4 pm.  Lunch from 1:00 pm- 2:00 pm You will not need to be fasting.   Testing/Procedures: No test ordered today    Follow-Up: At [facility], you and your health needs are our priority.  As part of our continuing mission to provide you with exceptional heart care, we have created designated Provider Care Teams.  These Care Teams include your primary Cardiologist (physician) and Advanced Practice Providers (APPs -  Physician Assistants and Nurse Practitioners) who all work together to provide you with the care you need, when you need it.  We recommend signing up for the patient portal called "MyChart".  Sign up information is provided on this After Visit Summary.  MyChart is used to connect with patients for Virtual Visits (Telemedicine).  Patients are able to view lab/test results, encounter notes, upcoming appointments, etc.  Non-urgent messages can be sent to your provider as well.   To learn more about what you can do with MyChart, go to [email or web address].    Your next appointment:   3 month(s)  Provider:   You may see Yvonne Kendall, MD or one of the following Advanced Practice Providers on your designated Care Team:   Nicolasa Ducking, NP Eula Listen, PA-C Cadence Fransico Michael, PA-C Charlsie Quest, NP

## 2023-07-20 NOTE — Progress Notes (Signed)
Cardiology Office Note:  .   Date:  07/20/2023  ID:  Monica Oliver, DOB 06-03-1951, MRN [id number] PCP: Katherina Right, MD  [facility] Providers Cardiologist:  Yvonne Kendall, MD     History of Present Illness: .   Monica Oliver is a 72 y.o. female with history of single-vessel coronary artery disease involving RPAV status post unsuccessful PCI due to resistant lesion necessitating drug-eluting stent placement to the distal RCA for management of iatrogenic dissection, hypertension, hyperlipidemia, type 2 diabetes mellitus, iron deficiency anemia, COPD, obstructive sleep apnea, shingles, and tobacco abuse who presents for follow-up of coronary artery disease with stable angina.  I last saw her in July, which time she felt fairly well but continued to complain of left upper chest, shoulder, and neck pain.  Pain had improved a little bit after shoulder joint injections by orthopedics, though tenderness to palpation persisted.  We agreed to increase amlodipine to 10 mg daily for additional antianginal and blood pressure control.  Today, Monica Oliver reports that she is feeling well other than some continued left shoulder pain.  It has improved with ongoing treatments by Dr. Martha Clan, though it still bothers her especially when she moves her shoulder in certain ways.  She is trying to do some range of motion exercises.  She does not have any frank chest pain at rest or with exertion.  She also denies shortness of breath, palpitations, lightheadedness, and edema.  She is tolerating her medications well, including the escalated amlodipine.  She happily reports that she has quit smoking.  ROS: See HPI  Studies Reviewed: Marland Kitchen        LHC/PCI (09/24/2021): Severe single-vessel coronary artery disease with 90% stenosis in the proximal RPAV branch, similar to prior catheterization in April. No significant CAD involving the left coronary artery. Vigorous left ventricular contraction with moderately  elevated filling pressure (LVEF greater than 65%, LVEDP 25-30 mmHg). Unsuccessful PCI to RPAV due to resistance of the lesion and inability to cross the stenosis completely with a 1.5 x 10 mm balloon.  Intervention was complicated by guide extension dissection of the distal RCA necessitating stent placement.  Final angiogram shows stable 90% stenosis of the RPAV and complete resolution of distal RCA dissection with TIMI-3 flow.  Risk Assessment/Calculations:             Physical Exam:   VS:  BP 136/74 (BP Location: Right Arm)   Pulse 81   Ht 5\' 3"  (1.6 m)   Wt 182 lb 2 oz (82.6 kg)   SpO2 97%   BMI 32.26 kg/m    Wt Readings from Last 3 Encounters:  07/20/23 182 lb 2 oz (82.6 kg)  04/19/23 182 lb 9.6 oz (82.8 kg)  02/01/23 186 lb 9.6 oz (84.6 kg)    General:  NAD. Neck: No JVD or HJR. Lungs: Clear to auscultation bilaterally without wheezes or crackles. Heart: Regular rate and rhythm without murmurs, rubs, or gallops. Abdomen: Soft, nontender, nondistended. Extremities: No lower extremity edema.  ASSESSMENT AND PLAN: .    Coronary artery disease with stable angina: No angina reported with current antianginal regimen consisting of amlodipine and metoprolol.  Monica Oliver has previously been intolerant of long-acting nitrates due to headache.  Will plan to continue her current medications.  Hypertension: Blood pressure mildly elevated today.  We have agreed to increase lisinopril to 20 mg daily.  I will repeat a BMP in 2 weeks to ensure stable renal function and electrolytes.  Left shoulder  pain: Continue follow-up with Dr. Martha Clan.  Hyperlipidemia associated with type 2 diabetes mellitus: Continue low-dose rosuvastatin and ezetimibe.  Statin myopathy has limited further escalation of statin therapy in the past.  Continue working on lifestyle modifications.    Dispo: Return to clinic in 3 months.  Signed, Yvonne Kendall, MD

## 2023-07-26 ENCOUNTER — Other Ambulatory Visit: Payer: Self-pay

## 2023-07-26 ENCOUNTER — Other Ambulatory Visit: Payer: Self-pay | Admitting: Internal Medicine

## 2023-07-26 MED ORDER — METOPROLOL TARTRATE 50 MG PO TABS
50.0000 mg | ORAL_TABLET | Freq: Two times a day (BID) | ORAL | 0 refills | Status: DC
  Filled 2023-07-26: qty 180, 90d supply, fill #0

## 2023-07-26 NOTE — Telephone Encounter (Signed)
last visit: 06/30/23 with plan to f/u in 3 months.  next visit: 10/25/23

## 2023-07-27 ENCOUNTER — Other Ambulatory Visit: Payer: Self-pay

## 2023-07-28 ENCOUNTER — Other Ambulatory Visit: Payer: Self-pay

## 2023-08-09 ENCOUNTER — Other Ambulatory Visit: Payer: Self-pay

## 2023-08-09 DIAGNOSIS — Z79899 Other long term (current) drug therapy: Secondary | ICD-10-CM

## 2023-08-10 ENCOUNTER — Other Ambulatory Visit: Payer: Self-pay

## 2023-08-10 DIAGNOSIS — F17210 Nicotine dependence, cigarettes, uncomplicated: Secondary | ICD-10-CM

## 2023-08-10 DIAGNOSIS — Z87891 Personal history of nicotine dependence: Secondary | ICD-10-CM

## 2023-08-10 DIAGNOSIS — Z122 Encounter for screening for malignant neoplasm of respiratory organs: Secondary | ICD-10-CM

## 2023-08-10 LAB — BASIC METABOLIC PANEL
BUN/Creatinine Ratio: 16 (ref 12–28)
BUN: 14 mg/dL (ref 8–27)
CO2: 25 mmol/L (ref 20–29)
Calcium: 9.7 mg/dL (ref 8.7–10.3)
Chloride: 100 mmol/L (ref 96–106)
Creatinine, Ser: 0.88 mg/dL (ref 0.57–1.00)
Glucose: 178 mg/dL — ABNORMAL HIGH (ref 70–99)
Potassium: 4.1 mmol/L (ref 3.5–5.2)
Sodium: 139 mmol/L (ref 134–144)
eGFR: 70 mL/min/{1.73_m2} (ref >59)

## 2023-08-14 ENCOUNTER — Other Ambulatory Visit: Payer: Self-pay

## 2023-08-14 MED ORDER — FLUTICASONE PROPIONATE 50 MCG/ACT NA SUSP
2.0000 | Freq: Every day | NASAL | 11 refills | Status: DC
  Filled 2023-08-14: qty 16, 30d supply, fill #0

## 2023-08-14 MED ORDER — OZEMPIC (1 MG/DOSE) 4 MG/3ML ~~LOC~~ SOPN
1.0000 mg | PEN_INJECTOR | SUBCUTANEOUS | 2 refills | Status: DC
  Filled 2023-08-14: qty 3, 28d supply, fill #0
  Filled 2023-09-15: qty 6, 56d supply, fill #0
  Filled 2023-11-16: qty 3, 28d supply, fill #1

## 2023-08-15 ENCOUNTER — Other Ambulatory Visit: Payer: Self-pay

## 2023-08-15 MED ORDER — FLUAD 0.5 ML IM SUSY
PREFILLED_SYRINGE | INTRAMUSCULAR | 0 refills | Status: DC
  Filled 2023-08-15: qty 0.5, 1d supply, fill #0

## 2023-08-15 MED FILL — Clopidogrel Bisulfate Tab 75 MG (Base Equiv): ORAL | 30 days supply | Qty: 30 | Fill #4 | Status: AC

## 2023-08-16 ENCOUNTER — Other Ambulatory Visit: Payer: Self-pay | Admitting: Family Medicine

## 2023-08-16 ENCOUNTER — Encounter: Payer: Self-pay | Admitting: Family Medicine

## 2023-08-16 DIAGNOSIS — Z1231 Encounter for screening mammogram for malignant neoplasm of breast: Secondary | ICD-10-CM

## 2023-08-18 ENCOUNTER — Other Ambulatory Visit: Payer: Self-pay

## 2023-08-23 ENCOUNTER — Ambulatory Visit
Admission: RE | Admit: 2023-08-23 | Discharge: 2023-08-23 | Disposition: A | Discharge status: Home / Self Care | Payer: Medicare Other | Source: Ambulatory Visit | Attending: Acute Care | Admitting: Acute Care

## 2023-08-23 DIAGNOSIS — F17210 Nicotine dependence, cigarettes, uncomplicated: Secondary | ICD-10-CM | POA: Diagnosis present

## 2023-08-23 DIAGNOSIS — Z122 Encounter for screening for malignant neoplasm of respiratory organs: Secondary | ICD-10-CM | POA: Diagnosis present

## 2023-08-23 DIAGNOSIS — Z87891 Personal history of nicotine dependence: Secondary | ICD-10-CM | POA: Diagnosis present

## 2023-08-31 ENCOUNTER — Other Ambulatory Visit: Payer: Self-pay

## 2023-09-05 ENCOUNTER — Other Ambulatory Visit: Payer: Self-pay | Admitting: Internal Medicine

## 2023-09-05 ENCOUNTER — Other Ambulatory Visit: Payer: Self-pay

## 2023-09-05 MED FILL — Ezetimibe Tab 10 MG: ORAL | 90 days supply | Qty: 90 | Fill #0 | Status: AC

## 2023-09-06 ENCOUNTER — Other Ambulatory Visit: Payer: Self-pay

## 2023-09-07 ENCOUNTER — Ambulatory Visit: Payer: Medicare Other | Attending: Internal Medicine

## 2023-09-07 ENCOUNTER — Other Ambulatory Visit: Payer: Self-pay

## 2023-09-07 DIAGNOSIS — R0609 Other forms of dyspnea: Secondary | ICD-10-CM | POA: Diagnosis not present

## 2023-09-07 DIAGNOSIS — F17210 Nicotine dependence, cigarettes, uncomplicated: Secondary | ICD-10-CM | POA: Diagnosis not present

## 2023-09-07 DIAGNOSIS — J449 Chronic obstructive pulmonary disease, unspecified: Secondary | ICD-10-CM | POA: Diagnosis present

## 2023-09-07 MED ORDER — ALBUTEROL SULFATE (2.5 MG/3ML) 0.083% IN NEBU
2.5000 mg | INHALATION_SOLUTION | Freq: Once | RESPIRATORY_TRACT | Status: AC
  Filled 2023-09-07: qty 3

## 2023-09-07 NOTE — Progress Notes (Signed)
PULMONARY OFFICE FOLLOW UP NOTE  PROBLEMS:  Mild COPD Smoker  DATA: LDCT 06/07/16: normal PFTs 06/13/16: mild obstruction with borderline significant improvement after bronchodilator. Normal TLC. Mildly reduced DLCO  INTERVAL HISTORY: Previously seen by VM and diagnosed with mild COPD.    CC  Follow-up assessment COPD Follow-up assessment for OSA Follow-up assessment for abnormal CT chest pulmonary nodules  HPI Assessment of OSA Previous AHI 35 Continue CPAP as prescribed  Excellent compliance report Reviewed compliance report in detail with patient Patient definitely benefits the use of CPAP therapy as prescribed CPAP prescription CPAP 8 AHI reduced to 0.5  No evidence of acute heart failure at this time No respiratory distress No fevers, chills, nausea, vomiting, diarrhea No evidence hemoptysis    Assessment of COPD  Patient has a history of moderate COPD  Pulmonary function test reviewed in detail with patient today  Previous FEV1 is 57% predicted  Current FEV1 is 40% predicted Ratio is reduced No significant bronchodilator response DLCO is mildly reduced Spirometry flow-volume loops restrictive and some obstructive pattern Total lung capacity volume 84% predicted 3.83 L RV 100% predicted Airway resistance is at 150s percent predicted    Patient with a history of bilateral lung nodules Previous CT scan October 2021 showed bilateral nodules CT chest 07/2022 did not show any new nodules Patient with previous history of bilateral lung nodules Previous CT chest July 08, 2020 Patient will need ongoing CT chest due to ongoing tobacco abuse 07/2022 CT chest did NOT show any nodules  CT chest 07/2023 Images reviewed independently with patient No significant abnormalities noted No evidence of nodules or lung masses No pneumonia pneumothorax or effusions Recommend follow-up CT lung cancer screening program   Smoking Assessment and Cessation  Counseling Upon further questioning, Patient smokes 1 pack a day I have advised patient to quit/stop smoking as soon as possible due to high risk for multiple medical problems  Patient is NOT willing to quit smoking  I have advised patient that we can assist and have options of Nicotine replacement therapy. I also advised patient on behavioral therapy and can provide oral medication therapy in conjunction with the other therapies Follow up next Office visit  for assessment of smoking cessation Smoking cessation counseling advised for 10  minutes Patient was explained about the data that shows prolong survival  if she stop smoking  BP 110/70 (BP Location: Left Arm, Patient Position: Sitting, Cuff Size: Normal)   Pulse 84   Temp 97.8 F (36.6 C) (Temporal)   Ht 5\' 3"  (1.6 m)   SpO2 98%   BMI 32.77 kg/m       Review of Systems: Gen:  Denies  fever, sweats, chills weight loss  HEENT: Denies blurred vision, double vision, ear pain, eye pain, hearing loss, nose bleeds, sore throat Cardiac:  No dizziness, chest pain or heaviness, chest tightness,edema, No JVD Resp:   No cough, -sputum production, -shortness of breath,-wheezing, -hemoptysis,  Other:  All other systems negative   Physical Examination:   General Appearance: No distress  EYES PERRLA, EOM intact.   NECK Supple, No JVD Pulmonary: normal breath sounds, No wheezing.  CardiovascularNormal S1,S2.  No m/r/g.   Abdomen: Benign, Soft, non-tender. Neurology UE/LE 5/5 strength, no focal deficits Ext pulses intact, cap refill intact ALL OTHER ROS ARE NEGATIVE    DATA:   CXR 09/24/16: vague left upper lobe opacity   CXR 11/10/16 : resolution of LUL opacity CT chest 07/16/2018-and discrete bilateral pulmonary nodules not visualized but  reported Mild lymphadenopathy anterior tracheal space  CT chest 07/2023 No suspicious focal pulmonary nodules or infiltrates.  Stable subtle peribronchial nodularity within the lung  apices bilaterally possibly representing changes of RB-ILD/smoking related lung disease.  CT scan results reviewed with patient in detail     ASSESSMENT AND PLAN  72 year old pleasant African-American female seen today for follow-up assessment for moderate severe COPD FEV1 48% predicted on pulmonary function test with underlying sleep apnea in the setting of ongoing tobacco abuse obesity and deconditioned state    Moderate to severe COPD  Previous FEV1 was 57% predicted  Current FEV1 is 48% predicted  Pulmonary function test reviewed in detail with patient today  Previous FEV1 is 57% predicted  Current FEV1 is 40% predicted Ratio is reduced No significant bronchodilator response DLCO is mildly reduced Spirometry flow-volume loops restrictive and some obstructive pattern Total lung capacity volume 84% predicted 3.83 L RV 100% predicted Airway resistance is at 150s percent predicted     Avoid secondhand smoke Avoid SICK contacts Recommend  Masking  when appropriate Recommend Keep up-to-date with vaccinations Continue to use albuterol as needed   Assessment of OSA Previous AHI  Continue CPAP as prescribed  Excellent compliance report Reviewed compliance report in detail with patient Patient definitely benefits the use of CPAP therapy as prescribed CPAP prescription  AHI reduced to  No evidence of acute heart failure at this time No respiratory distress No fevers, chills, nausea, vomiting, diarrhea No evidence hemoptysis    Patient Instructions  Continue to use CPAP every night, minimum of 4-6 hours a night.  Change equipment every 30 days or as directed by DME.  Wash your tubing with warm soap and water daily, hang to dry. Wash humidifier portion weekly. Use bottled, distilled water and change daily   Be aware of reduced alertness and do not drive or operate heavy machinery if experiencing this or drowsiness.  Exercise encouraged, as tolerated. Encouraged proper  weight management.  Important to get eight or more hours of sleep  Limiting the use of the computer and television before bedtime.  Decrease naps during the day, so night time sleep will become enhanced.  Limit caffeine, and sleep deprivation.  HTN, stroke, uncontrolled diabetes and heart failure are potential risk factors.  Risk of untreated sleep apnea including cardiac arrhthymias, stroke, DM, pulm HTN.     Smoking cessation strongly advised  Obesity -recommend significant weight loss -recommend changing diet  Deconditioned state -Recommend increased daily activity and exercise   Lung cancer screening program CT chest 07/2023 reviewed in detail No significant findings at this time Previous CT scans reviewed Findings may be consistent with RB ILD smoking-related disease Follow-up lung cancer screening program   MEDICATION ADJUSTMENTS/LABS AND TESTS ORDERED:    CURRENT MEDICATIONS REVIEWED AT LENGTH WITH PATIENT TODAY   Patient  satisfied with Plan of action and management. All questions answered  Follow up 6 months    Time Spent Involved in Patient Care on Day of Examination: 44 mins     Yevette Knust Santiago Glad, M.D.  [facility]  Medical Director [facility] Medical Director [facility] Cardio-Pulmonary Department

## 2023-09-08 ENCOUNTER — Encounter: Payer: Self-pay | Admitting: Internal Medicine

## 2023-09-08 ENCOUNTER — Ambulatory Visit: Payer: Medicare Other | Admitting: Internal Medicine

## 2023-09-08 VITALS — BP 110/70 | HR 84 | Temp 97.8°F | Ht 63.0 in

## 2023-09-08 DIAGNOSIS — F17210 Nicotine dependence, cigarettes, uncomplicated: Secondary | ICD-10-CM | POA: Diagnosis not present

## 2023-09-08 DIAGNOSIS — R918 Other nonspecific abnormal finding of lung field: Secondary | ICD-10-CM

## 2023-09-08 DIAGNOSIS — G4733 Obstructive sleep apnea (adult) (pediatric): Secondary | ICD-10-CM | POA: Diagnosis not present

## 2023-09-08 DIAGNOSIS — J449 Chronic obstructive pulmonary disease, unspecified: Secondary | ICD-10-CM

## 2023-09-08 MED ORDER — TRELEGY ELLIPTA 200-62.5-25 MCG/ACT IN AEPB: 1.0000 | INHALATION_SPRAY | Freq: Every day | RESPIRATORY_TRACT | 0 refills | Status: DC

## 2023-09-08 NOTE — Patient Instructions (Addendum)
PLEASE STOP SMOKING  continue CPAP as prescribed Keep up the great work! A+  Follow up Lung cancer screening protocol  CONTINUE TRELEGY AS PRESCRIBED  Albuterol as needed Avoid secondhand smoke Avoid SICK contacts Recommend  Masking  when appropriate Recommend Keep up-to-date with vaccinations

## 2023-09-12 ENCOUNTER — Telehealth: Payer: Self-pay

## 2023-09-12 ENCOUNTER — Other Ambulatory Visit: Payer: Self-pay

## 2023-09-12 DIAGNOSIS — Z122 Encounter for screening for malignant neoplasm of respiratory organs: Secondary | ICD-10-CM

## 2023-09-12 DIAGNOSIS — F17210 Nicotine dependence, cigarettes, uncomplicated: Secondary | ICD-10-CM

## 2023-09-12 DIAGNOSIS — Z87891 Personal history of nicotine dependence: Secondary | ICD-10-CM

## 2023-09-12 NOTE — Telephone Encounter (Signed)
Spoke with patient. Reviewed CT results. Patient to f/u with PCP and discuss dilated common duct.

## 2023-09-14 LAB — PULMONARY FUNCTION TEST ARMC ONLY
DL/VA % pred: 103 %
DL/VA % pred: 103 ml/min/mmHg/L
DLCO unc % pred: 75 ml/min/mmHg
DLCO unc % pred: 75 ml/min/mmHg/L
FEF2575-%Pred-Post: 45 %
FEF2575-%Pred-Post: 45 L/s
FEF2575-%Pred-Pre: 37 L/s
FEF2575-%Pred-Pre: 37 L/s
FEV1-%Pred-Post: 49 %
FEV1-Post: 48 L
FEV1-Post: 49 L
FEV1-Pre: 48 L
FEV6-%Pred-Post: 71 %
FEV6-Post: 66 L
FEV6-Post: 71 L
FEV6-Pre: 2 L
FEV6-Pre: 66 L
FEV6FVC-%Change-Post: 0.67 L/s
FEV6FVC-%Pred-Post: 104 %
FVC-%Change-Post: 11 L
FVC-%Pred-Post: 70 %
FVC-Post: 63 L
FVC-Post: 70 L
FVC-Pre: 63 L
Post FEV1/FVC ratio: 53 %
Post FEV1/FVC ratio: 75 %
Post FEV6/FVC ratio: 104 %
Post FEV6/FVC ratio: 104 %
Pre FEV1/FVC ratio: 57 %
Pre FEV1/FVC ratio: 75 %
Pre FEV6/FVC Ratio: 100 %
Pre FEV6/FVC Ratio: 104 %
RV % pred: 100 L
RV: 21 L
TLC % pred: 84 L
TLC % pred: 84 ml/min/mmHg
TLC: 100 L

## 2023-09-15 ENCOUNTER — Other Ambulatory Visit: Payer: Self-pay

## 2023-09-25 ENCOUNTER — Other Ambulatory Visit: Payer: Self-pay | Admitting: Internal Medicine

## 2023-09-25 ENCOUNTER — Other Ambulatory Visit: Payer: Self-pay

## 2023-09-25 MED ORDER — ROSUVASTATIN CALCIUM 5 MG PO TABS
5.0000 mg | ORAL_TABLET | ORAL | 0 refills | Status: DC
  Filled 2023-09-25: qty 36, 84d supply, fill #0

## 2023-09-25 MED FILL — Clopidogrel Bisulfate Tab 75 MG (Base Equiv): ORAL | 30 days supply | Qty: 30 | Fill #5 | Status: AC

## 2023-09-29 ENCOUNTER — Other Ambulatory Visit: Payer: Self-pay

## 2023-10-09 ENCOUNTER — Emergency Department: Payer: No Typology Code available for payment source

## 2023-10-09 ENCOUNTER — Encounter: Payer: Self-pay | Admitting: Emergency Medicine

## 2023-10-09 ENCOUNTER — Other Ambulatory Visit: Payer: Self-pay

## 2023-10-09 ENCOUNTER — Emergency Department
Admission: EM | Admit: 2023-10-09 | Discharge: 2023-10-09 | Disposition: A | Discharge status: Home / Self Care | Payer: No Typology Code available for payment source | Attending: Emergency Medicine | Admitting: Emergency Medicine

## 2023-10-09 DIAGNOSIS — S60222A Contusion of left hand, initial encounter: Secondary | ICD-10-CM | POA: Diagnosis not present

## 2023-10-09 DIAGNOSIS — M79642 Pain in left hand: Secondary | ICD-10-CM | POA: Diagnosis present

## 2023-10-09 DIAGNOSIS — Y92410 Unspecified street and highway as the place of occurrence of the external cause: Secondary | ICD-10-CM | POA: Diagnosis not present

## 2023-10-09 DIAGNOSIS — I10 Essential (primary) hypertension: Secondary | ICD-10-CM | POA: Diagnosis not present

## 2023-10-09 DIAGNOSIS — M7918 Myalgia, other site: Secondary | ICD-10-CM | POA: Insufficient documentation

## 2023-10-09 DIAGNOSIS — E119 Type 2 diabetes mellitus without complications: Secondary | ICD-10-CM | POA: Insufficient documentation

## 2023-10-09 MED ORDER — CYCLOBENZAPRINE HCL 10 MG PO TABS
5.0000 mg | ORAL_TABLET | Freq: Once | ORAL | Status: AC
  Administered 2023-10-09: 5 mg via ORAL
  Filled 2023-10-09: qty 1

## 2023-10-09 MED ORDER — CYCLOBENZAPRINE HCL 5 MG PO TABS
5.0000 mg | ORAL_TABLET | Freq: Three times a day (TID) | ORAL | 0 refills | Status: DC | PRN
  Filled 2023-10-09: qty 15, 5d supply, fill #0

## 2023-10-09 MED ORDER — ACETAMINOPHEN 500 MG PO TABS
1000.0000 mg | ORAL_TABLET | Freq: Once | ORAL | Status: AC
  Administered 2023-10-09: 1000 mg via ORAL
  Filled 2023-10-09: qty 2
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
## 2023-10-09 NOTE — Discharge Instructions (Signed)
 Your exam and x-ray are normal and reassuring at this time.  No signs of a serious fracture to your hand.  You have some bruising to the hand and the chest wall (breast).  You can expect to experience some muscle soreness and stiffness over the next few days.  Take OTC Tylenol  along with the prescription muscle relaxant as needed.  Apply ice to the hand and warm compresses to the chest wall to help reduce symptoms.  Follow-up with your primary provider for ongoing concerns.

## 2023-10-24 ENCOUNTER — Other Ambulatory Visit: Payer: Self-pay | Admitting: Family Medicine

## 2023-10-24 DIAGNOSIS — M8580 Other specified disorders of bone density and structure, unspecified site: Secondary | ICD-10-CM

## 2023-10-24 DIAGNOSIS — Z780 Asymptomatic menopausal state: Secondary | ICD-10-CM

## 2023-10-25 ENCOUNTER — Other Ambulatory Visit: Payer: Self-pay

## 2023-10-25 ENCOUNTER — Encounter: Payer: Self-pay | Admitting: Internal Medicine

## 2023-10-25 ENCOUNTER — Ambulatory Visit: Payer: Medicare Other | Attending: Internal Medicine | Admitting: Internal Medicine

## 2023-10-25 VITALS — BP 126/68 | HR 80 | Ht 63.0 in | Wt 183.6 lb

## 2023-10-25 DIAGNOSIS — I25118 Atherosclerotic heart disease of native coronary artery with other forms of angina pectoris: Secondary | ICD-10-CM

## 2023-10-25 DIAGNOSIS — I10 Essential (primary) hypertension: Secondary | ICD-10-CM

## 2023-10-25 DIAGNOSIS — E1169 Type 2 diabetes mellitus with other specified complication: Secondary | ICD-10-CM | POA: Diagnosis not present

## 2023-10-25 DIAGNOSIS — Z720 Tobacco use: Secondary | ICD-10-CM

## 2023-10-25 DIAGNOSIS — E785 Hyperlipidemia, unspecified: Secondary | ICD-10-CM

## 2023-10-25 MED ORDER — AMLODIPINE BESYLATE 10 MG PO TABS
10.0000 mg | ORAL_TABLET | Freq: Every day | ORAL | 3 refills | Status: DC
  Filled 2023-10-25: qty 90, 90d supply, fill #0
  Filled 2024-01-22: qty 90, 90d supply, fill #1
  Filled 2024-04-29: qty 90, 90d supply, fill #2
  Filled 2024-07-04 – 2024-07-06 (×2): qty 90, 90d supply, fill #3

## 2023-10-25 MED ORDER — METOPROLOL TARTRATE 50 MG PO TABS
50.0000 mg | ORAL_TABLET | Freq: Two times a day (BID) | ORAL | 3 refills | Status: AC
  Filled 2023-10-25: qty 180, 90d supply, fill #0
  Filled 2024-01-22: qty 180, 90d supply, fill #1
  Filled 2024-04-29: qty 180, 90d supply, fill #2
  Filled 2024-08-15: qty 180, 90d supply, fill #3

## 2023-10-25 MED ORDER — NITROGLYCERIN 0.4 MG SL SUBL
0.4000 mg | SUBLINGUAL_TABLET | SUBLINGUAL | 3 refills | Status: AC | PRN
  Filled 2023-10-25: qty 25, 8d supply, fill #0

## 2023-10-25 MED ORDER — ROSUVASTATIN CALCIUM 5 MG PO TABS
5.0000 mg | ORAL_TABLET | ORAL | 3 refills | Status: AC
  Filled 2023-10-25 – 2024-01-04 (×2): qty 36, 84d supply, fill #0
  Filled 2024-04-16 – 2024-08-12 (×3): qty 36, 84d supply, fill #1

## 2023-10-25 MED ORDER — FUROSEMIDE 20 MG PO TABS
20.0000 mg | ORAL_TABLET | Freq: Every day | ORAL | 3 refills | Status: AC | PRN
  Filled 2023-10-25: qty 30, 30d supply, fill #0

## 2023-10-25 MED ORDER — CLOPIDOGREL BISULFATE 75 MG PO TABS
75.0000 mg | ORAL_TABLET | Freq: Every day | ORAL | 3 refills | Status: AC
  Filled 2023-10-25: qty 90, 90d supply, fill #0
  Filled 2024-01-22: qty 90, 90d supply, fill #1
  Filled 2024-04-29: qty 90, 90d supply, fill #2
  Filled 2024-09-06: qty 90, 90d supply, fill #3

## 2023-10-25 MED ORDER — EZETIMIBE 10 MG PO TABS
10.0000 mg | ORAL_TABLET | Freq: Every day | ORAL | 3 refills | Status: AC
  Filled 2023-10-25 – 2023-11-29 (×2): qty 90, 90d supply, fill #0
  Filled 2024-03-11: qty 90, 90d supply, fill #1
  Filled 2024-07-04: qty 90, 90d supply, fill #2
  Filled 2024-10-04 (×2): qty 90, 90d supply, fill #3

## 2023-10-25 MED ORDER — LISINOPRIL 20 MG PO TABS
20.0000 mg | ORAL_TABLET | Freq: Every day | ORAL | 3 refills | Status: DC
  Filled 2023-10-25: qty 90, 90d supply, fill #0
  Filled 2024-01-22: qty 90, 90d supply, fill #1
  Filled 2024-04-19: qty 90, 90d supply, fill #2
  Filled 2024-07-04: qty 90, 90d supply, fill #3

## 2023-10-25 MED ORDER — VARENICLINE TARTRATE 1 MG PO TABS
1.0000 mg | ORAL_TABLET | Freq: Two times a day (BID) | ORAL | 0 refills | Status: AC
  Filled 2023-10-25: qty 56, 28d supply, fill #0
  Filled 2024-01-16: qty 98, 49d supply, fill #0

## 2023-10-25 MED ORDER — VARENICLINE TARTRATE (STARTER) 0.5 MG X 11 & 1 MG X 42 PO TBPK
ORAL_TABLET | ORAL | 0 refills | Status: DC
  Filled 2023-10-25: qty 53, 28d supply, fill #0

## 2023-10-25 NOTE — Patient Instructions (Signed)
Medication Instructions:  Your physician recommends the following medication changes.  STOP TAKING: Aspirin 81 mg  START TAKING: Chantix: Take 0.5 mg once daily for 3 days, then increase to 0.5 mg twice daily for 4 days, followed by 1 mg twice daily for 12 weeks.   *If you need a refill on your cardiac medications before your next appointment, please call your pharmacy*   Lab Work: No labs ordered today    Testing/Procedures: No test ordered today    Follow-Up: At [facility], you and your health needs are our priority.  As part of our continuing mission to provide you with exceptional heart care, we have created designated Provider Care Teams.  These Care Teams include your primary Cardiologist (physician) and Advanced Practice Providers (APPs -  Physician Assistants and Nurse Practitioners) who all work together to provide you with the care you need, when you need it.  We recommend signing up for the patient portal called "MyChart".  Sign up information is provided on this After Visit Summary.  MyChart is used to connect with patients for Virtual Visits (Telemedicine).  Patients are able to view lab/test results, encounter notes, upcoming appointments, etc.  Non-urgent messages can be sent to your provider as well.   To learn more about what you can do with MyChart, go to [email or web address].    Your next appointment:   6 month(s)  Provider:   You may see Yvonne Kendall, MD or one of the following Advanced Practice Providers on your designated Care Team:   Nicolasa Ducking, NP Eula Listen, PA-C Cadence Fransico Michael, PA-C Charlsie Quest, NP Carlos Levering, NP
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
## 2023-10-27 ENCOUNTER — Other Ambulatory Visit: Payer: Self-pay

## 2023-11-02 ENCOUNTER — Other Ambulatory Visit: Payer: Self-pay

## 2023-11-10 ENCOUNTER — Other Ambulatory Visit: Payer: Self-pay

## 2023-11-16 ENCOUNTER — Other Ambulatory Visit: Payer: Self-pay

## 2023-11-16 MED ORDER — OZEMPIC (1 MG/DOSE) 4 MG/3ML ~~LOC~~ SOPN
1.0000 mg | PEN_INJECTOR | SUBCUTANEOUS | 3 refills | Status: DC
  Filled 2023-11-16: qty 12, 112d supply, fill #0
  Filled 2024-02-08 – 2024-03-18 (×3): qty 12, 112d supply, fill #1

## 2023-11-24 ENCOUNTER — Telehealth: Payer: Self-pay

## 2023-11-24 ENCOUNTER — Telehealth: Payer: Self-pay | Admitting: Internal Medicine

## 2023-11-24 MED ORDER — TRELEGY ELLIPTA 200-62.5-25 MCG/ACT IN AEPB: 1.0000 | INHALATION_SPRAY | Freq: Every day | RESPIRATORY_TRACT | Status: DC

## 2023-11-24 NOTE — Telephone Encounter (Signed)
 Patient came into the office today asking for samples or Trelegy 200.   Per Dr. Belia Heman, ok to give samples of Trelegy.  Samples given.

## 2023-11-24 NOTE — Telephone Encounter (Signed)
 Patient dropped off [employer] Chronic Condition Release of Information form that needs to be completed and faxed to the fax number on form. Form placed in nurse's box. Please advise.

## 2023-11-24 NOTE — Telephone Encounter (Signed)
 Per the patient, the Trelegy is too expensive.  Pharmacy team can you see what a cheaper alternative is to the Trelegy? Thank you!

## 2023-11-28 ENCOUNTER — Other Ambulatory Visit (HOSPITAL_COMMUNITY): Payer: Self-pay

## 2023-11-28 NOTE — Telephone Encounter (Signed)
 Unable to process test claims as insurance coverage terminated 11-24-2023

## 2023-11-28 NOTE — Telephone Encounter (Signed)
 Lm for patient.

## 2023-11-28 NOTE — Telephone Encounter (Signed)
 Patient states insurance is [employer].. They reinstated her insurance. Patient phone number is [phone].

## 2023-11-29 ENCOUNTER — Other Ambulatory Visit: Payer: Self-pay

## 2023-11-29 ENCOUNTER — Other Ambulatory Visit (HOSPITAL_COMMUNITY): Payer: Self-pay

## 2023-11-29 MED ORDER — TRAMADOL HCL 50 MG PO TABS
50.0000 mg | ORAL_TABLET | Freq: Three times a day (TID) | ORAL | 0 refills | Status: DC
Start: 2023-11-29 — End: 2023-12-22
  Filled 2023-11-29: qty 15, 5d supply, fill #0

## 2023-11-29 MED FILL — Fluticasone Propionate Nasal Susp 50 MCG/ACT: NASAL | 30 days supply | Qty: 16 | Fill #1 | Status: AC

## 2023-11-29 NOTE — Telephone Encounter (Signed)
 Only other alternative in same class, triple therapy, is Clinical cytogeneticist. Co-pay for this is $251.01. Shows patient has a deductible to meet.

## 2023-11-30 NOTE — Telephone Encounter (Signed)
 Forms completed by MD nnd faxed back to [facility].

## 2023-12-01 NOTE — Telephone Encounter (Signed)
 Monica Oliver would be too expensive as well but she will try which ever one that the doctor prefers for her to have.

## 2023-12-01 NOTE — Telephone Encounter (Signed)
 Lmtcb

## 2023-12-05 ENCOUNTER — Other Ambulatory Visit: Payer: Self-pay

## 2023-12-05 NOTE — Telephone Encounter (Signed)
 I spoke with the patient. I asked her to contact her insurance company and find out what a cheaper alternative to the Trelegy would be that they will pay for. She said she will keep paying for the Trelegy until she can reach out to her insurance company. She will call us back once she talks to her insurance company.  Nothing further needed.

## 2023-12-08 ENCOUNTER — Other Ambulatory Visit: Payer: Self-pay

## 2023-12-22 ENCOUNTER — Other Ambulatory Visit: Payer: Self-pay

## 2023-12-22 MED ORDER — TRAMADOL HCL 50 MG PO TABS
50.0000 mg | ORAL_TABLET | Freq: Three times a day (TID) | ORAL | 0 refills | Status: DC
Start: 2023-12-22 — End: 2024-02-08
  Filled 2023-12-22: qty 15, 5d supply, fill #0

## 2024-01-05 ENCOUNTER — Other Ambulatory Visit: Payer: Self-pay

## 2024-01-16 ENCOUNTER — Other Ambulatory Visit: Payer: Self-pay

## 2024-01-16 ENCOUNTER — Other Ambulatory Visit: Payer: Self-pay | Admitting: Internal Medicine

## 2024-01-22 ENCOUNTER — Other Ambulatory Visit: Payer: Self-pay

## 2024-02-01 ENCOUNTER — Encounter: Payer: Self-pay | Admitting: Internal Medicine

## 2024-02-01 ENCOUNTER — Ambulatory Visit: Payer: Medicare Other | Admitting: Internal Medicine

## 2024-02-01 VITALS — BP 100/60 | HR 75 | Temp 98.5°F | Ht 63.0 in | Wt 177.8 lb

## 2024-02-01 DIAGNOSIS — G4733 Obstructive sleep apnea (adult) (pediatric): Secondary | ICD-10-CM

## 2024-02-01 DIAGNOSIS — J449 Chronic obstructive pulmonary disease, unspecified: Secondary | ICD-10-CM | POA: Diagnosis not present

## 2024-02-01 NOTE — Patient Instructions (Addendum)
 Congratulations for quitting!! Continue to use Trelegy as prescribed and as needed Rinse mouth after every use Avoid Allergens and Irritants Avoid secondhand smoke Avoid SICK contacts Recommend  Masking  when appropriate Recommend Keep up-to-date with vaccinations  CHANGE CPAP 10 cm h20  Follow up Lung Cancer screening program   Excellent Job A+ GOLD STAR!!  Continue CPAP as prescribed  Patient Instructions Continue to use CPAP every night, minimum of 4-6 hours a night.  Change equipment every 30 days or as directed by DME.  Wash your tubing with warm soap and water daily, hang to dry. Wash humidifier portion weekly. Use bottled, distilled water and change daily   Be aware of reduced alertness and do not drive or operate heavy machinery if experiencing this or drowsiness.  Exercise encouraged, as tolerated. Encouraged proper weight management.  Important to get eight or more hours of sleep  Limiting the use of the computer and television before bedtime.  Decrease naps during the day, so night time sleep will become enhanced.  Limit caffeine, and sleep deprivation.    Avoid Allergens and Irritants Avoid secondhand smoke Avoid SICK contacts Recommend  Masking  when appropriate Recommend Keep up-to-date with vaccinations

## 2024-02-01 NOTE — Progress Notes (Signed)
 PULMONARY OFFICE FOLLOW UP NOTE  PROBLEMS:  Mild COPD Smoker  DATA: LDCT 06/07/16: normal PFTs 06/13/16: mild obstruction with borderline significant improvement after bronchodilator. Normal TLC. Mildly reduced DLCO  INTERVAL HISTORY: Previously seen by VM and diagnosed with mild COPD.    CC  Follow-up assessment for COPD Follow-up assessment for OSA Follow-up assessment abnormal CT chest pulmonary nodules  HPI Assessment of OSA Previous AHI 35 Patient uses and benefits from therapy Using CPAP nightly and with naps Pressure setting is comfortable and is sleeping well. CPAP prescription CPAP 8 AHI reduced to 0.5  No exacerbation at this time No evidence of heart failure at this time No evidence or signs of infection at this time No respiratory distress No fevers, chills, nausea, vomiting, diarrhea No evidence of lower extremity edema No evidence hemoptysis    Assessment of COPD  moderate COPD  Previous FEV1 is 57% predicted  Current FEV1 is 40% predicted Ratio is reduced No significant bronchodilator response DLCO is mildly reduced Spirometry flow-volume loops restrictive and some obstructive pattern Total lung capacity volume 84% predicted 3.83 L RV 100% predicted Airway resistance is at 150s percent predicted     Patient with a history of bilateral lung nodules Previous CT scan October 2021 showed bilateral nodules CT chest 07/2022 did not show any new nodules Patient with previous history of bilateral lung nodules Previous CT chest July 08, 2020 Patient will need ongoing CT chest due to ongoing tobacco abuse 07/2022 CT chest did NOT show any nodules CT chest 07/2023 Images reviewed independently with patient No significant abnormalities noted No evidence of nodules or lung masses No pneumonia pneumothorax or effusions Recommend follow-up CT lung cancer screening program    BP 100/60 (BP Location: Right Arm, Patient Position: Sitting, Cuff Size:  Normal)   Pulse 75   Temp 98.5 F (36.9 C) (Oral)   Ht 5\' 3"  (1.6 m)   Wt 177 lb 12.8 oz (80.6 kg)   SpO2 98%   BMI 31.50 kg/m    Review of Systems: Gen:  Denies  fever, sweats, chills weight loss  HEENT: Denies blurred vision, double vision, ear pain, eye pain, hearing loss, nose bleeds, sore throat Cardiac:  No dizziness, chest pain or heaviness, chest tightness,edema, No JVD Resp:   No cough, -sputum production, -shortness of breath,-wheezing, -hemoptysis,  Other:  All other systems negative   Physical Examination:   General Appearance: No distress  EYES PERRLA, EOM intact.   NECK Supple, No JVD Pulmonary: normal breath sounds, No wheezing.  CardiovascularNormal S1,S2.  No m/r/g.   Abdomen: Benign, Soft, non-tender. Neurology UE/LE 5/5 strength, no focal deficits Ext pulses intact, cap refill intact ALL OTHER ROS ARE NEGATIVE    DATA:   CXR 09/24/16: vague left upper lobe opacity   CXR 11/10/16 : resolution of LUL opacity CT chest 07/16/2018-and discrete bilateral pulmonary nodules not visualized but reported Mild lymphadenopathy anterior tracheal space  CT chest 07/2023 No suspicious focal pulmonary nodules or infiltrates.  Stable subtle peribronchial nodularity within the lung apices bilaterally possibly representing changes of RB-ILD/smoking related lung disease.  CT scan results reviewed with patient in detail     ASSESSMENT AND PLAN  73 year old pleasant African-American female seen today for follow-up assessment for moderate severe COPD FEV1 48% predicted on pulmonary function test with underlying severe sleep apnea AHI 35 in the setting of ongoing tobacco abuse obesity and deconditioned state   Assessment of COPD Moderate to severe COPD  Previous FEV1 was 57% predicted  Current FEV1 is 48% predicted  Previous FEV1 is 57% predicted  Current FEV1 is 40% predicted continue inhalers as prescribed Rinse mouth after every use Continue albuterol  as  needed Avoid Allergens and Irritants Avoid secondhand smoke Avoid SICK contacts Recommend  Masking  when appropriate Recommend Keep up-to-date with vaccinations   Assessment of OSA Previous AHI 35 Patient uses and benefits from therapy Using CPAP nightly and with naps Pressure setting is comfortable and is sleeping well. Current pressure CPAP 8 will change to 10 AHI well-controlled down to 0.5  Patient Instructions  Continue to use CPAP every night, minimum of 4-6 hours a night.  Change equipment every 30 days or as directed by DME.  Wash your tubing with warm soap and water daily, hang to dry. Wash humidifier portion weekly. Use bottled, distilled water and change daily   Be aware of reduced alertness and do not drive or operate heavy machinery if experiencing this or drowsiness.  Exercise encouraged, as tolerated. Encouraged proper weight management.  Important to get eight or more hours of sleep  Limiting the use of the computer and television before bedtime.  Decrease naps during the day, so night time sleep will become enhanced.  Limit caffeine, and sleep deprivation.  HTN, stroke, uncontrolled diabetes and heart failure are potential risk factors.  Risk of untreated sleep apnea including cardiac arrhthymias, stroke, DM, pulm HTN.   Obesity -recommend significant weight loss -recommend changing diet  Deconditioned state -Recommend increased daily activity and exercise  Lung cancer screening program CT chest 07/2023 reviewed in detail No significant findings at this time Previous CT scans reviewed Findings may be consistent with RB ILD smoking-related disease Follow-up lung cancer screening program   MEDICATION ADJUSTMENTS/LABS AND TESTS ORDERED: Continue to use Trelegy as prescribed and as needed Rinse mouth after every use Avoid Allergens and Irritants Avoid secondhand smoke Avoid SICK contacts Recommend  Masking  when appropriate Recommend Keep up-to-date  with vaccinations CHANGE CPAP 10 cm h20 Follow up Lung Cancer screening program   CURRENT MEDICATIONS REVIEWED AT LENGTH WITH PATIENT TODAY   Patient  satisfied with Plan of action and management. All questions answered  Follow up in 6 months    Time Spent Involved in Patient Care on Day of Examination: 46 mins     Desmin Daleo Nestora Baptise, M.D.  [facility]  Medical Director [facility] Medical Director [facility] Cardio-Pulmonary Department

## 2024-02-08 ENCOUNTER — Other Ambulatory Visit: Payer: Self-pay

## 2024-02-08 MED ORDER — TRAMADOL HCL 50 MG PO TABS
50.0000 mg | ORAL_TABLET | Freq: Three times a day (TID) | ORAL | 0 refills | Status: AC
Start: 2024-02-08 — End: ?
  Filled 2024-02-08: qty 15, 5d supply, fill #0

## 2024-02-09 ENCOUNTER — Other Ambulatory Visit: Payer: Self-pay

## 2024-03-05 ENCOUNTER — Telehealth: Payer: Self-pay | Admitting: Internal Medicine

## 2024-03-05 NOTE — Telephone Encounter (Signed)
   Pre-operative Risk Assessment    Patient Name: Monica Oliver  DOB: Jan 30, 1951 MRN: [id number]   Date of last office visit: 10/25/23 Date of next office visit: n/a   Request for Surgical Clearance    Procedure:  midline interlaminar L4-L5 epidural steroid injection  Date of Surgery:  Clearance TBD                                Surgeon:  Dr Adrian Hopper Group or Practice Name:  [facility] Phone number:  [phone] Fax number:  [phone]   Type of Clearance Requested:   - Pharmacy:  Hold Clopidogrel  (Plavix ) discontinue 7 days prior resume 24 hours   Type of Anesthesia:  None    Additional requests/questions:    Augustine Led   03/05/2024, 3:39 PM

## 2024-03-06 NOTE — Telephone Encounter (Signed)
 Patient had cath in 08/2021 with unsuccessful PCI to RPAV due to resistance of the lesion and inability to cross the stenosis completely with a 1.5 x 10 mm balloon.  Intervention was complicated by guide extension dissection of the distal RCA necessitating stent placement. Initially treated with DAPT with ASA, plavix . When seen in clinic on 10/25/23, aspirin  was stopped with plans to continue plavix  monotherapy.   OK to hold plavix  for 7 days prior to steroid injection. Would recommend treating with ASA 81 mg daily while plavix  is held, unless bleeding risk is felt to be too high by Surgeon.   I will fax recommendations to requesting team

## 2024-03-11 ENCOUNTER — Other Ambulatory Visit: Payer: Self-pay

## 2024-03-11 ENCOUNTER — Ambulatory Visit
Admission: RE | Admit: 2024-03-11 | Discharge: 2024-03-11 | Disposition: A | Discharge status: Home / Self Care | Source: Ambulatory Visit | Attending: Family Medicine | Admitting: Family Medicine

## 2024-03-11 DIAGNOSIS — Z1231 Encounter for screening mammogram for malignant neoplasm of breast: Secondary | ICD-10-CM | POA: Diagnosis present

## 2024-03-11 DIAGNOSIS — Z780 Asymptomatic menopausal state: Secondary | ICD-10-CM

## 2024-03-11 DIAGNOSIS — M8580 Other specified disorders of bone density and structure, unspecified site: Secondary | ICD-10-CM

## 2024-03-12 ENCOUNTER — Encounter

## 2024-03-12 ENCOUNTER — Other Ambulatory Visit

## 2024-03-18 ENCOUNTER — Other Ambulatory Visit: Payer: Self-pay | Admitting: Student

## 2024-03-18 ENCOUNTER — Other Ambulatory Visit: Payer: Self-pay

## 2024-03-18 DIAGNOSIS — R928 Other abnormal and inconclusive findings on diagnostic imaging of breast: Secondary | ICD-10-CM

## 2024-03-19 ENCOUNTER — Other Ambulatory Visit: Payer: Self-pay

## 2024-03-19 ENCOUNTER — Ambulatory Visit
Admission: RE | Admit: 2024-03-19 | Discharge: 2024-03-19 | Disposition: A | Discharge status: Home / Self Care | Source: Ambulatory Visit | Attending: Student | Admitting: Student

## 2024-03-19 DIAGNOSIS — R928 Other abnormal and inconclusive findings on diagnostic imaging of breast: Secondary | ICD-10-CM | POA: Diagnosis present

## 2024-03-20 ENCOUNTER — Other Ambulatory Visit: Payer: Self-pay

## 2024-03-21 ENCOUNTER — Other Ambulatory Visit: Payer: Self-pay

## 2024-03-22 ENCOUNTER — Other Ambulatory Visit: Payer: Self-pay

## 2024-03-25 ENCOUNTER — Other Ambulatory Visit: Payer: Self-pay

## 2024-03-25 MED ORDER — OMEPRAZOLE 20 MG PO CPDR
20.0000 mg | DELAYED_RELEASE_CAPSULE | Freq: Every day | ORAL | 2 refills | Status: AC | PRN
  Filled 2024-03-25: qty 90, 90d supply, fill #0
  Filled 2024-07-04: qty 90, 90d supply, fill #1

## 2024-03-28 ENCOUNTER — Other Ambulatory Visit: Payer: Self-pay

## 2024-03-28 MED ORDER — CELECOXIB 100 MG PO CAPS
100.0000 mg | ORAL_CAPSULE | Freq: Two times a day (BID) | ORAL | 3 refills | Status: AC | PRN
  Filled 2024-03-28: qty 60, 30d supply, fill #0
  Filled 2024-05-10: qty 60, 30d supply, fill #1
  Filled 2024-07-04: qty 60, 30d supply, fill #2

## 2024-03-28 MED ORDER — MICONAZOLE NITRATE 2 % VA CREA
1.0000 | TOPICAL_CREAM | Freq: Every day | VAGINAL | 0 refills | Status: AC
  Filled 2024-03-28: qty 45, 7d supply, fill #0

## 2024-03-28 MED ORDER — PREMARIN 0.625 MG/GM VA CREA
1.0000 | TOPICAL_CREAM | VAGINAL | 4 refills | Status: AC
  Filled 2024-03-28: qty 30, 53d supply, fill #0

## 2024-03-28 MED ORDER — ALENDRONATE SODIUM 70 MG PO TABS
ORAL_TABLET | ORAL | 4 refills | Status: DC
  Filled 2024-03-28: qty 12, 84d supply, fill #0

## 2024-04-04 ENCOUNTER — Other Ambulatory Visit: Payer: Self-pay

## 2024-04-04 MED ORDER — ROSUVASTATIN CALCIUM 5 MG PO TABS
5.0000 mg | ORAL_TABLET | Freq: Every day | ORAL | 3 refills | Status: DC
  Filled 2024-04-04: qty 90, 90d supply, fill #0

## 2024-04-04 MED ORDER — OZEMPIC (2 MG/DOSE) 8 MG/3ML ~~LOC~~ SOPN
2.0000 mg | PEN_INJECTOR | SUBCUTANEOUS | 2 refills | Status: AC
  Filled 2024-04-04 – 2024-04-16 (×2): qty 9, 84d supply, fill #0

## 2024-04-08 ENCOUNTER — Encounter: Payer: Self-pay | Admitting: Intensive Care

## 2024-04-08 ENCOUNTER — Other Ambulatory Visit: Payer: Self-pay

## 2024-04-08 ENCOUNTER — Ambulatory Visit: Payer: Self-pay | Admitting: Internal Medicine

## 2024-04-08 ENCOUNTER — Emergency Department

## 2024-04-08 ENCOUNTER — Emergency Department: Admission: EM | Admit: 2024-04-08 | Discharge: 2024-04-08 | Disposition: A | Discharge status: Home / Self Care

## 2024-04-08 DIAGNOSIS — I5030 Unspecified diastolic (congestive) heart failure: Secondary | ICD-10-CM | POA: Diagnosis not present

## 2024-04-08 DIAGNOSIS — I110 Hypertensive heart disease with heart failure: Secondary | ICD-10-CM | POA: Diagnosis not present

## 2024-04-08 DIAGNOSIS — J449 Chronic obstructive pulmonary disease, unspecified: Secondary | ICD-10-CM | POA: Insufficient documentation

## 2024-04-08 DIAGNOSIS — E119 Type 2 diabetes mellitus without complications: Secondary | ICD-10-CM | POA: Insufficient documentation

## 2024-04-08 DIAGNOSIS — J069 Acute upper respiratory infection, unspecified: Secondary | ICD-10-CM | POA: Insufficient documentation

## 2024-04-08 DIAGNOSIS — R059 Cough, unspecified: Secondary | ICD-10-CM | POA: Diagnosis present

## 2024-04-08 LAB — TROPONIN I (HIGH SENSITIVITY)
Troponin I (High Sensitivity): 4 ng/L (ref <18)
Troponin I (High Sensitivity): 7 ng/L (ref <18)

## 2024-04-08 LAB — BASIC METABOLIC PANEL WITH GFR
Anion gap: 10 (ref 5–15)
BUN: 14 mg/dL (ref 8–23)
CO2: 26 mmol/L (ref 22–32)
Calcium: 9.8 mg/dL (ref 8.9–10.3)
Chloride: 103 mmol/L (ref 98–111)
Creatinine, Ser: 0.86 mg/dL (ref 0.44–1.00)
GFR, Estimated: >60 mL/min (ref >60)
Glucose, Bld: 199 mg/dL — ABNORMAL HIGH (ref 70–99)
Potassium: 3.2 mmol/L — ABNORMAL LOW (ref 3.5–5.1)
Sodium: 139 mmol/L (ref 135–145)

## 2024-04-08 LAB — CBC
HCT: 46.7 % — ABNORMAL HIGH (ref 36.0–46.0)
Hemoglobin: 15.7 g/dL — ABNORMAL HIGH (ref 12.0–15.0)
MCH: 31 pg (ref 26.0–34.0)
MCHC: 33.6 g/dL (ref 30.0–36.0)
MCV: 92.3 fL (ref 80.0–100.0)
Platelets: 252 K/uL (ref 150–400)
RBC: 5.06 MIL/uL (ref 3.87–5.11)
RDW: 14.2 % (ref 11.5–15.5)
WBC: 9 K/uL (ref 4.0–10.5)
nRBC: 0 % (ref 0.0–0.2)

## 2024-04-08 LAB — PROTIME-INR
INR: 0.9 (ref 0.8–1.2)
Prothrombin Time: 12.5 s (ref 11.4–15.2)

## 2024-04-08 MED ORDER — BENZONATATE 100 MG PO CAPS
100.0000 mg | ORAL_CAPSULE | Freq: Three times a day (TID) | ORAL | 0 refills | Status: AC | PRN
  Filled 2024-04-08: qty 30, 10d supply, fill #0

## 2024-04-08 MED ORDER — IPRATROPIUM-ALBUTEROL 0.5-2.5 (3) MG/3ML IN SOLN
3.0000 mL | Freq: Once | RESPIRATORY_TRACT | Status: AC
  Administered 2024-04-08: 3 mL via RESPIRATORY_TRACT
  Filled 2024-04-08: qty 3

## 2024-04-08 MED ORDER — ALBUTEROL SULFATE 1.25 MG/3ML IN NEBU
1.0000 | INHALATION_SOLUTION | Freq: Four times a day (QID) | RESPIRATORY_TRACT | 2 refills | Status: AC | PRN
  Filled 2024-04-08: qty 75, 7d supply, fill #0

## 2024-04-08 MED ORDER — POTASSIUM CHLORIDE 20 MEQ PO PACK
40.0000 meq | PACK | Freq: Once | ORAL | Status: AC
  Administered 2024-04-08: 40 meq via ORAL
  Filled 2024-04-08: qty 2

## 2024-04-08 NOTE — ED Provider Notes (Signed)
 [facility] Provider Note    Event Date/Time   First MD Initiated Contact with Patient 04/08/24 1814     (approximate)   History   Chest Pain and Shortness of Breath  Pt c/o tightness in chest with sob that started last night.   Denies N/V  Takes plavix  daily   HPI Monica Oliver is a 73 y.o. female PMH hypertension, hyperlipidemia, COPD, diabetes, diastolic CHF presents for evaluation of cough, nasal congestion - Present for about 2 days.  Has noticed a lot of nasal congestion dripping down the back of her throat and causing a little discomfort.  No fevers.  Says she sometimes feels a little short of breath.  Feels chest congestion but denies any chest pain, no wheezing.  Feels well currently.     Physical Exam   Triage Vital Signs: ED Triage Vitals  Encounter Vitals Group     BP 04/08/24 1543 (!) 146/67     Girls Systolic BP Percentile --      Girls Diastolic BP Percentile --      Boys Systolic BP Percentile --      Boys Diastolic BP Percentile --      Pulse Rate 04/08/24 1543 88     Resp 04/08/24 1543 20     Temp 04/08/24 1543 97.8 F (36.6 C)     Temp Source 04/08/24 1543 Oral     SpO2 04/08/24 1543 97 %     Weight 04/08/24 1540 178 lb (80.7 kg)     Height 04/08/24 1540 5' 3 (1.6 m)     Head Circumference --      Peak Flow --      Pain Score 04/08/24 1540 8     Pain Loc --      Pain Education --      Exclude from Growth Chart --     Most recent vital signs: Vitals:   04/08/24 1830 04/08/24 2005  BP: 133/61   Pulse: 82   Resp: 18   Temp:  98.3 F (36.8 C)  SpO2: 96%      General: Awake, no distress.  HEENT: Posterior oropharynx unremarkable, does have some white spots on the roof of her mouth near the insertion point of her dentures CV:  Good peripheral perfusion. RRR, RP 2+ Resp:  Normal effort. CTAB --good airflow throughout, no focal coarse breath sounds, no wheezing Abd:  No distention. Nontender to deep palpation  throughout   ED Results / Procedures / Treatments   Labs (all labs ordered are listed, but only abnormal results are displayed) Labs Reviewed  BASIC METABOLIC PANEL WITH GFR - Abnormal; Notable for the following components:      Result Value   Potassium 3.2 (*)    Glucose, Bld 199 (*)    All other components within normal limits  CBC - Abnormal; Notable for the following components:   Hemoglobin 15.7 (*)    HCT 46.7 (*)    All other components within normal limits  PROTIME-INR  TROPONIN I (HIGH SENSITIVITY)  TROPONIN I (HIGH SENSITIVITY)     EKG  See ED course below.   RADIOLOGY Radiology interpreted by myself and radiology report reviewed.  No acute pathology identified.    PROCEDURES:  Critical Care performed: No  Procedures   MEDICATIONS ORDERED IN ED: Medications  ipratropium-albuterol  (DUONEB) 0.5-2.5 (3) MG/3ML nebulizer solution 3 mL (has no administration in time range)  potassium chloride  (KLOR-CON ) packet 40 mEq (has no administration  in time range)     IMPRESSION / MDM / ASSESSMENT AND PLAN / ED COURSE  I reviewed the triage vital signs and the nursing notes.                              DDX/MDM/AP: Differential diagnosis includes, but is not limited to, URI, consider but doubt underlying pneumonia, consider very mild COPD exacerbation though very reassuring exam here.  Do not suspect bacterial pharyngitis at this time.  With regard to white spots on roof of mouth, asymptomatic, recommend follow-up with her dentist and PMD, considered but doubt malignancy, consider minor thrush, consider food remnant --not emergent issue and not related to presentation today.  Plan: - Labs collected at triage - Chest x-ray ordered at triage - EKG - Patient would like a trial of a DuoNeb here and a refill of her home nebulizer solution  Patient's presentation is most consistent with acute complicated illness / injury requiring diagnostic workup.  The patient is  on the cardiac monitor to evaluate for evidence of arrhythmia and/or significant heart rate changes.  ED course below.  Workup unremarkable, offered viral testing but patient declined.  Mild hypokalemia, repleted p.o.  Presentation overall most consistent with uncomplicated URI, no clear evidence of COPD exacerbation at this time.  Will refill her home albuterol , Rx Tessalon  Perles for cough.  Plan for PMD follow-up as well as dental follow-up regarding the lesions in the roof of her mouth.  ED return precautions in place.  Expectant management, can use Tylenol  as needed, already has at home.  Patient agrees with plan.  Clinical Course as of 04/08/24 2006  Mon Apr 08, 2024  1941 CBC overall unremarkable, essentially at baseline, hemoglobin slightly above prior, consider hemoconcentration  BMP with mild hypokalemia, there is unremarkable  Troponin normal [MM]  1941 Chest x-ray reviewed, unremarkable [MM]  1941 Ecg = sinus rhythm, rate 86, no gross ST elevation or depression, no significant repolarization abnormality, left axis deviation, normal intervals.  No evidence of ischemia nor arrhythmia on my interpretation. [MM]  1958 Rpt trop stable [MM]    Clinical Course User Index [MM] Clarine Ozell LABOR, MD     FINAL CLINICAL IMPRESSION(S) / ED DIAGNOSES   Final diagnoses:  Upper respiratory tract infection, unspecified type     Rx / DC Orders   ED Discharge Orders          Ordered    albuterol  (ACCUNEB ) 1.25 MG/3ML nebulizer solution  Every 6 hours PRN        04/08/24 2005    benzonatate  (TESSALON  PERLES) 100 MG capsule  3 times daily PRN        04/08/24 2005             Note:  This document was prepared using Dragon voice recognition software and may include unintentional dictation errors.   Clarine Ozell LABOR, MD 04/08/24 2006

## 2024-04-08 NOTE — Telephone Encounter (Signed)
 Patient advised. She will head to emergency room today. NFN.

## 2024-04-08 NOTE — Telephone Encounter (Signed)
 Please advise. Patient is requesting to be seen in office. Patient aware that provider is out of the office. I did advise patient to head to Emergency room or urgent care if symptoms are worsening.

## 2024-04-08 NOTE — ED Triage Notes (Signed)
 Pt c/o tightness in chest with sob that started last night.   Denies N/V  Takes plavix  daily

## 2024-04-08 NOTE — Telephone Encounter (Signed)
 I have reviewed her records.  She has significant cardiac and pulmonary history.  Given the chest discomfort and her current symptoms she must be seen in the emergency room.  She will likely need imaging and other interventions that will require ED visit.

## 2024-04-08 NOTE — Telephone Encounter (Signed)
 FYI Only or Action Required?: Action required by provider: refusing 911/ED, requesting appt.  Patient is followed in Pulmonology for COPD and OSA, last seen on 02/01/2024 by Isaiah Scrivener, MD.  Called Nurse Triage reporting Chest Pain, Cough, Shortness of Breath, Nasal Congestion, and Hoarse.  Symptoms began yesterday.  Interventions attempted: Maintenance inhaler.  Symptoms are: rapidly worsening.  Triage Disposition: Call EMS 911 Now  Patient/caregiver understands and will follow disposition?: No, refuses disposition      Copied from CRM [id number]. Topic: Clinical - Red Word Triage >> Apr 08, 2024  1:41 PM Monica Oliver wrote: Red Word that prompted transfer to Nurse Triage: States tightness in her chest starting last night, worsened overnight, coughing up some phlegm, no particular color. Reason for Disposition  [1] Chest pain lasts > 5 minutes AND [2] age > 56  Answer Assessment - Initial Assessment Questions E2C2 Pulmonary Triage - Initial Assessment Questions Chief Complaint (e.g., cough, sob, wheezing, fever, chills, sweat or additional symptoms) *Go to specific symptom protocol after initial questions. Tightness in chest started last night and worsened overnight, even worse today A little hoarse, like a tightness in my voice Drainage going down back of throat Mainly feeling near throat, middle of chest Constant right now, this morning No nausea or sweating Nasal congestion and runny nose, voice changes with singing at church Don't know if trouble breathing, I guess so, having to catch breath more often No fever or dizziness Want to breathe and can't breathe Not weaker than normal  How long have symptoms been present? Last night  Have you used your inhalers/maintenance medication? Yes If yes, What medications? Trelegy, no rescue inhaler, didn't seem to help  OXYGEN: Do you wear supplemental oxygen? No  Do you monitor your oxygen levels? No  7. CARDIAC RISK  FACTORS: Do you have any history of heart problems or risk factors for heart disease? (e.g., angina, prior heart attack; diabetes, high blood pressure, high cholesterol, smoker, or strong family history of heart disease)     significant  8. PULMONARY RISK FACTORS: Do you have any history of lung disease?  (e.g., blood clots in lung, asthma, emphysema, birth control pills)     Significant  Trying not to go to hospital or ED Denies chest pain, chest tightness like can't breathe Advised strongly hospital since no rescue inhaler and pulm office may not have all the resources needed for the condition she is in Pt still would rather be seen in pulm  Advised call 911 or ED if refusing 911, pt refusing hospital, advised strongly hospital since no rescue inhaler and pulm office may not have all the resources needed for the condition she is in. Pt refusing, still requesting pulm appt, sending message to office for call back with further recommendations.  Protocols used: Chest Pain-A-AH

## 2024-04-08 NOTE — Discharge Instructions (Addendum)
 Your evaluation in the emergency department is overall reassuring.  I suspect you have a viral illness that should improve on its own in the next 3-4 days.  I prescribed you a cough medication as well as a refill of your albuterol  nebulizer solution.  Please do follow-up with your primary care doctor for reevaluation as well as your dentist for evaluation of the lesions in the roof of your mouth.  Return to the emergency department with any new or worsening symptoms.

## 2024-04-09 ENCOUNTER — Other Ambulatory Visit: Payer: Self-pay

## 2024-04-10 ENCOUNTER — Other Ambulatory Visit: Payer: Self-pay

## 2024-04-10 MED FILL — Fluticasone Propionate Nasal Susp 50 MCG/ACT: NASAL | 30 days supply | Qty: 16 | Fill #2 | Status: AC

## 2024-04-16 ENCOUNTER — Other Ambulatory Visit: Payer: Self-pay

## 2024-04-17 ENCOUNTER — Other Ambulatory Visit: Payer: Self-pay

## 2024-04-19 ENCOUNTER — Other Ambulatory Visit: Payer: Self-pay

## 2024-04-25 ENCOUNTER — Ambulatory Visit: Admitting: Medical

## 2024-04-29 ENCOUNTER — Other Ambulatory Visit: Payer: Self-pay

## 2024-04-29 ENCOUNTER — Ambulatory Visit: Attending: Medical | Admitting: Medical

## 2024-04-29 ENCOUNTER — Encounter: Payer: Self-pay | Admitting: Medical

## 2024-04-29 VITALS — BP 130/70 | HR 95 | Ht 63.0 in | Wt 178.8 lb

## 2024-04-29 DIAGNOSIS — I25118 Atherosclerotic heart disease of native coronary artery with other forms of angina pectoris: Secondary | ICD-10-CM

## 2024-04-29 DIAGNOSIS — E782 Mixed hyperlipidemia: Secondary | ICD-10-CM

## 2024-04-29 DIAGNOSIS — Z720 Tobacco use: Secondary | ICD-10-CM

## 2024-04-29 DIAGNOSIS — I10 Essential (primary) hypertension: Secondary | ICD-10-CM | POA: Diagnosis not present

## 2024-04-29 DIAGNOSIS — R079 Chest pain, unspecified: Secondary | ICD-10-CM

## 2024-04-29 DIAGNOSIS — E876 Hypokalemia: Secondary | ICD-10-CM

## 2024-04-29 MED ORDER — ISOSORBIDE MONONITRATE ER 30 MG PO TB24
15.0000 mg | ORAL_TABLET | Freq: Every day | ORAL | 3 refills | Status: DC
  Filled 2024-04-29: qty 45, 90d supply, fill #0

## 2024-04-29 NOTE — Progress Notes (Signed)
 Cardiology Office Note   Date:  04/29/2024  ID:  Monica Oliver, DOB [birth year], MRN [id number] PCP: Deloria Therisa BIRCH, MD   HeartCare Providers Cardiologist:  Lonni Hanson, MD     History of Present Illness Monica Oliver is a 73 y.o. female with a h/o single-vessel coronary artery disease involving RPAV s/p unsuccessful PCI due to resistant lesion necessitating drug-eluting stent placement to the distal RCA for management of iatrogenic dissection in 2022, HTN, HLD, DM2, iron def anemia, COPD, OSA, shingles, tobacco abuse who presents for ER follow-up.   The patient was last seen 09/2023 reporting mild chest discomfort. Medical therapy was continued. ASA was stopped.   She was in the ER for chest pressure, SOB, coughing. HS trop was negative. Other labs showed K3.2. she was felt to have URI.   Today, the patient reports she has intermittent chest pressure on the left side that is chronic. Pain is worse with exertion. She also has exertional shortness of breath. She feels like she had URI symptoms for 2 weeks. She denies lower leg edema. She is wanting to know her heart is OK since her stent. She takes lasix  as needed for lower leg edema.   Studies Reviewed EKG Interpretation Date/Time:  Monday April 29 2024 13:57:52 EDT Ventricular Rate:  95 PR Interval:  148 QRS Duration:  74 QT Interval:  386 QTC Calculation: 485 R Axis:   -51  Text Interpretation: Normal sinus rhythm Left axis deviation Low voltage QRS Inferior-posterior infarct , age undetermined When compared with ECG of 08-Apr-2024 15:49, Inferior-posterior infarct is now Present Confirmed by Franchester, Arica Bevilacqua ([id number]) on 04/29/2024 2:01:01 PM    [facility] 08/2021 Conclusions: Severe single-vessel coronary artery disease with 90% stenosis in the proximal RPAV branch, similar to prior catheterization in April. No significant CAD involving the left coronary artery. Vigorous left ventricular contraction with moderately elevated  filling pressure (LVEF greater than 65%, LVEDP 25-30 mmHg). Unsuccessful PCI to RPAV due to resistance of the lesion and inability to cross the stenosis completely with a 1.5 x 10 mm balloon.  Intervention was complicated by guide extension dissection of the distal RCA necessitating stent placement.  Final angiogram shows stable 90% stenosis of the RPAV and complete resolution of distal RCA dissection with TIMI-3 flow.   Recommendations: Continue dual antiplatelet therapy with aspirin  and clopidogrel  for at least 12 months. Escalate antianginal therapy; will continue metoprolol  tartrate 25 mg twice daily and increase amlodipine  to 5 mg daily.  Patient has previously been intolerant of isosorbide  mononitrate but may need to be rechallenged with this in the future. Aggressive secondary prevention of coronary artery disease.  Continue ezetimibe  for now but consider PCSK9 inhibitor as an outpatient. If patient has refractory angina, may require reattempted PCI at [facility] from a femoral approach versus consideration of single-vessel bypass.   Lonni Hanson, MD [facility]  [facility] 12/2020 Conclusions: Severe single-vessel coronary artery disease with 80 to 90% stenosis of the RPAV.  There is mild, nonobstructive disease involving the mid RCA and ostium of D2. Normal left ventricular systolic function with mildly elevated filling pressure.   Recommendations: Optimize medical therapy for treatment of angina and progression of coronary artery disease.  Specifically, we will increase metoprolol  tartrate to 50 mg twice daily.  Continue ezetimibe  in lieu of statin therapy given history of statin intolerance and well-controlled LDL. Proceed with work-up for recently diagnosed iron deficiency anemia including colonoscopy +/- upper endoscopy. If patient has refractory angina, PCI to the  RPAV could be considered once evaluation/treatment of her newly diagnosed anemia has been completed.   Lonni Hanson,  MD [facility]  Echo 10/2018 1. The left ventricle has normal systolic function of 55-60%. The cavity  size was normal. There is moderately increased left ventricular wall  thickness. Left ventricular diastolic Doppler parameters are consistent  with impaired relaxation No evidence of   left ventricular regional wall motion abnormalities.   2. The mitral valve is normal in structure. There is mild mitral annular  calcification present.   3. The tricuspid valve is normal in structure.   4. The aortic valve is tricuspid.   5. The aortic root and ascending aorta are normal in size and structure.   6. No evidence of left ventricular regional wall motion abnormalities.       Physical Exam VS:  BP 130/70 (BP Location: Left Arm, Patient Position: Sitting, Cuff Size: Normal)   Pulse 95   Ht 5' 3 (1.6 m)   Wt 178 lb 12.8 oz (81.1 kg)   SpO2 98%   BMI 31.67 kg/m        Wt Readings from Last 3 Encounters:  04/29/24 178 lb 12.8 oz (81.1 kg)  04/08/24 178 lb (80.7 kg)  02/01/24 177 lb 12.8 oz (80.6 kg)    GEN: Well nourished, well developed in no acute distress NECK: No JVD; No carotid bruits CARDIAC: RRR, no murmurs, rubs, gallops RESPIRATORY:  Clear to auscultation without rales, wheezing or rhonchi  ABDOMEN: Soft, non-tender, non-distended EXTREMITIES:  No edema; No deformity   ASSESSMENT AND PLAN  CAD s/p PCI/DES RCA in 2022 with stable angina Patient had a recent ER visit with chest pain, shortness of breath, cough felt to be secondary to URI.  High-sensitivity troponin was negative.  She reports intermittent chest tightness on the left side that has been occurring for many years. She has exertional dyspnea that is stable.  EKG today with no ischemic changes.  Will continue amlodipine  10 mg daily and metoprolol  50 mg twice daily.  I will add on Imdur  15 mg daily and order a cardiac PET stress test.  Continue Plavix  75 mg daily, Zetia  and Crestor .   HTN BP is good today.  Add  on Imdur  as above.  Continue amlodipine , lisinopril  and metoprolol .  Hyperlipidemia LDL 54 last year.  Continue Crestor  5 mg 3 times a week and Zetia  10 mg daily.  Tobacco use Patient is trying to quit.  Hypokalemia K 3.2 in the ER and she was given supplementation. Repeat BMET today.        Dispo: Follow-up in 4-6 weeks  Signed, Hisao Doo VEAR Fishman, PA-C

## 2024-04-29 NOTE — Patient Instructions (Addendum)
 Medication Instructions:  Your physician recommends the following medication changes.  START TAKING: Imdur  15 mg by mouth daily   *If you need a refill on your cardiac medications before your next appointment, please call your pharmacy*  Lab Work: Your provider would like for you to return to have the following labs drawn: BMP.   Please go to [facility] [address] ([facility]) [address], [place] [address] You do not need an appointment.  They are open from 8 am- 4:30 pm.  Lunch from 1:00 pm- 2:00 pm You will not need to be fasting.    Testing/Procedures:    Please report to Radiology at the [facility] Main Entrance 30 minutes early for your test.  [address] [place], [place] [address]                         OR   Please report to Radiology at [facility] Main Entrance, medical mall, 30 mins prior to your test.  [address]  [place], [place]  How to Prepare for Your Cardiac PET/CT Stress Test:  Nothing to eat or drink, except water, 3 hours prior to arrival time.  NO caffeine/decaffeinated products, or chocolate 12 hours prior to arrival. (Please note decaffeinated beverages (teas/coffees) still contain caffeine).  If you have caffeine within 12 hours prior, the test will need to be rescheduled.  Medication instructions: Do not take erectile dysfunction medications for 72 hours prior to test (sildenafil, tadalafil) Do not take nitrates (isosorbide  mononitrate, Ranexa) the day before or day of test Do not take tamsulosin the day before or morning of test Hold theophylline containing medications for 12 hours. Hold Dipyridamole 48 hours prior to the test.  Diabetic Preparation: If able to eat breakfast prior to 3 hour fasting, you may take all medications, including your insulin . Do not worry if you miss your breakfast dose of insulin  - start at your next meal. If you do not eat prior to 3 hour  fast-Hold all diabetes (oral and insulin ) medications. Patients who wear a continuous glucose monitor MUST remove the device prior to scanning.  You may take your remaining medications with water.  NO perfume, cologne or lotion on chest or abdomen area. FEMALES - Please avoid wearing dresses to this appointment.  Total time is 1 to 2 hours; you may want to bring reading material for the waiting time.  IF YOU THINK YOU MAY BE PREGNANT, OR ARE NURSING PLEASE INFORM THE TECHNOLOGIST.  In preparation for your appointment, medication and supplies will be purchased.  Appointment availability is limited, so if you need to cancel or reschedule, please call the Radiology Department Scheduler at [phone] 24 hours in advance to avoid a cancellation fee of $100.00  What to Expect When you Arrive:  Once you arrive and check in for your appointment, you will be taken to a preparation room within the Radiology Department.  A technologist or Nurse will obtain your medical history, verify that you are correctly prepped for the exam, and explain the procedure.  Afterwards, an IV will be started in your arm and electrodes will be placed on your skin for EKG monitoring during the stress portion of the exam. Then you will be escorted to the PET/CT scanner.  There, staff will get you positioned on the scanner and obtain a blood pressure and EKG.  During the exam, you will continue to be connected to the EKG  and blood pressure machines.  A small, safe amount of a radioactive tracer will be injected in your IV to obtain a series of pictures of your heart along with an injection of a stress agent.    After your Exam:  It is recommended that you eat a meal and drink a caffeinated beverage to counter act any effects of the stress agent.  Drink plenty of fluids for the remainder of the day and urinate frequently for the first couple of hours after the exam.  Your doctor will inform you of your test results within 7-10  business days.  For more information and frequently asked questions, please visit our website: [email or web address]  For questions about your test or how to prepare for your test, please call: Cardiac Imaging Nurse Navigators Office: [phone]   Follow-Up: At [facility], you and your health needs are our priority.  As part of our continuing mission to provide you with exceptional heart care, our providers are all part of one team.  This team includes your primary Cardiologist (physician) and Advanced Practice Providers or APPs (Physician Assistants and Nurse Practitioners) who all work together to provide you with the care you need, when you need it.  Your next appointment:   4-6 week(s)  Provider:   Lonni Hanson, MD

## 2024-05-01 LAB — BASIC METABOLIC PANEL WITH GFR
BUN/Creatinine Ratio: 13 (ref 12–28)
BUN: 13 mg/dL (ref 8–27)
CO2: 22 mmol/L (ref 20–29)
Calcium: 9.1 mg/dL (ref 8.7–10.3)
Chloride: 98 mmol/L (ref 96–106)
Creatinine, Ser: 0.99 mg/dL (ref 0.57–1.00)
Glucose: 104 mg/dL — ABNORMAL HIGH (ref 70–99)
Potassium: 3.9 mmol/L (ref 3.5–5.2)
Sodium: 136 mmol/L (ref 134–144)
eGFR: 61 mL/min/1.73 (ref >59)

## 2024-05-02 ENCOUNTER — Ambulatory Visit: Payer: Self-pay | Admitting: Medical

## 2024-05-02 ENCOUNTER — Encounter (HOSPITAL_COMMUNITY): Payer: Self-pay

## 2024-05-06 ENCOUNTER — Other Ambulatory Visit: Payer: Self-pay

## 2024-05-07 ENCOUNTER — Ambulatory Visit
Admission: RE | Admit: 2024-05-07 | Discharge: 2024-05-07 | Disposition: A | Discharge status: Home / Self Care | Source: Ambulatory Visit | Attending: Medical | Admitting: Medical

## 2024-05-07 DIAGNOSIS — I700 Atherosclerosis of aorta: Secondary | ICD-10-CM | POA: Diagnosis not present

## 2024-05-07 DIAGNOSIS — R079 Chest pain, unspecified: Secondary | ICD-10-CM | POA: Insufficient documentation

## 2024-05-07 DIAGNOSIS — J439 Emphysema, unspecified: Secondary | ICD-10-CM | POA: Diagnosis not present

## 2024-05-07 LAB — NM PET CT CARDIAC PERFUSION MULTI W/ABSOLUTE BLOODFLOW
MBFR: 2.79
Nuc Rest EF: 66 %
Nuc Stress EF: 68 %
Peak HR: 100 {beats}/min
Rest HR: 91 {beats}/min
Rest MBF: 0.96 ml/g/min
Rest Nuclear Isotope Dose: 20.9 mCi
SRS: 0
SSS: 2
ST Depression (mm): 0 mm
Stress MBF: 2.68 ml/g/min
Stress Nuclear Isotope Dose: 20.9 mCi
TID: 1.13

## 2024-05-07 MED ORDER — REGADENOSON 0.4 MG/5ML IV SOLN
0.4000 mg | Freq: Once | INTRAVENOUS | Status: AC
  Administered 2024-05-07 (×2): 0.4 mg via INTRAVENOUS
  Filled 2024-05-07: qty 5

## 2024-05-07 MED ORDER — RUBIDIUM RB82 GENERATOR (RUBYFILL)
25.0000 | PACK | Freq: Once | INTRAVENOUS | Status: AC
  Administered 2024-05-07 (×2): 20.86 via INTRAVENOUS

## 2024-05-07 MED ORDER — REGADENOSON 0.4 MG/5ML IV SOLN
INTRAVENOUS | Status: AC
Start: 2024-05-07 — End: 2024-05-07
  Filled 2024-05-07: qty 5

## 2024-05-07 MED ORDER — RUBIDIUM RB82 GENERATOR (RUBYFILL)
25.0000 | PACK | Freq: Once | INTRAVENOUS | Status: AC
  Administered 2024-05-07 (×2): 20.89 via INTRAVENOUS

## 2024-05-07 NOTE — Progress Notes (Signed)
 Pt tolerated lexiscan . Some SOB and nausea at beginning of scan, subsided by end of scan. PIV removed and given PO caffeine.

## 2024-05-15 ENCOUNTER — Ambulatory Visit: Admitting: Internal Medicine

## 2024-05-15 ENCOUNTER — Other Ambulatory Visit: Payer: Self-pay

## 2024-05-15 NOTE — Progress Notes (Deleted)
  Cardiology Office Note:  .   Date:  05/15/2024  ID:  Monica Oliver, DOB 1951-01-10, MRN [id number] PCP: Deloria Therisa BIRCH, MD  [facility] Providers Cardiologist:  Lonni Hanson, MD { Click to update primary MD,subspecialty MD or APP then REFRESH:1}    History of Present Illness: .   Monica Oliver is a 73 y.o. female with history of single-vessel coronary artery disease involving RPAV status post unsuccessful PCI due to resistant lesion necessitating drug-eluting stent placement to the distal RCA for management of iatrogenic dissection, hypertension, hyperlipidemia, type 2 diabetes mellitus, iron deficiency anemia, COPD, obstructive sleep apnea, shingles, and tobacco abuse, who presents for follow-up of coronary artery disease with stable angina.  She was seen about 2 weeks ago by [employer], PA, due to intermittent left-sided chest discomfort worsened with exertion and associated with dyspnea that had been chronic, though she noted some recent URI symptoms.  She had been seen in the ED at 2 weeks earlier, with her escalation of symptoms felt to be due to a forementioned URI.  She was referred for myocardial PET/CT, which was normal without evidence of ischemia or scar.  ROS: See HPI  Studies Reviewed: SABRA        Myocardial PET/CT (05/07/2024): Low risk study with normal myocardial perfusion.  No ischemia or scar.  LVEF 55-70%.  Normal global myocardial blood flow reserve.  Risk Assessment/Calculations:   {Does this patient have ATRIAL FIBRILLATION?:[phone]} No BP recorded.  {Refresh Note OR Click here to enter BP  :1}***       Physical Exam:   VS:  There were no vitals taken for this visit.   Wt Readings from Last 3 Encounters:  04/29/24 178 lb 12.8 oz (81.1 kg)  04/08/24 178 lb (80.7 kg)  02/01/24 177 lb 12.8 oz (80.6 kg)    General:  NAD. Neck: No JVD or HJR. Lungs: Clear to auscultation bilaterally without wheezes or crackles. Heart: Regular rate and rhythm without  murmurs, rubs, or gallops. Abdomen: Soft, nontender, nondistended. Extremities: No lower extremity edema.  ASSESSMENT AND PLAN: .    ***    {Are you ordering a CV Procedure (e.g. stress test, cath, DCCV, TEE, etc)?   Press F2        :[id number]}  Dispo: ***  Signed, Lonni Hanson, MD

## 2024-05-16 ENCOUNTER — Other Ambulatory Visit: Payer: Self-pay

## 2024-05-16 MED ORDER — OZEMPIC (2 MG/DOSE) 8 MG/3ML ~~LOC~~ SOPN
2.0000 | PEN_INJECTOR | SUBCUTANEOUS | 3 refills | Status: AC
  Filled 2024-05-16: qty 12, 112d supply, fill #0
  Filled 2024-10-14: qty 12, 3d supply, fill #1

## 2024-06-03 ENCOUNTER — Other Ambulatory Visit: Payer: Self-pay

## 2024-06-03 ENCOUNTER — Ambulatory Visit: Payer: Self-pay | Admitting: Internal Medicine

## 2024-06-03 DIAGNOSIS — U071 COVID-19: Secondary | ICD-10-CM

## 2024-06-03 MED ORDER — PAXLOVID (300/100) 20 X 150 MG & 10 X 100MG PO TBPK: 3.0000 | ORAL_TABLET | Freq: Two times a day (BID) | ORAL | 0 refills | Status: DC

## 2024-06-03 NOTE — Telephone Encounter (Signed)
 Today--COVID test positive at home Patient just wants something prescribed before she gets any worse---Cough medication and/or medication for COVID such as Paxlovid    FYI Only or Action Required?: Action required by provider: Patient requesting cough medicine and medication for COVID--positive with a home test today--symptoms started two days ago.  Patient is followed in Pulmonology for COPD, last seen on 02/01/2024 by Isaiah Scrivener, MD.  Called Nurse Triage reporting Cough and Covid Positive.  Symptoms began 2 days ago.  Interventions attempted: Rescue inhaler, Maintenance inhaler, and Nebulizer treatments.  Symptoms are: gradually worsening.  Triage Disposition: Call PCP Within 24 Hours  Patient/caregiver understands and will follow disposition?: No, wishes to speak with PCP               Copied from CRM #[id number]. Topic: Clinical - Medical Advice >> Jun 03, 2024  8:11 AM Joesph PARAS wrote: Reason for CRM: Patient is calling to state she is positive for COVID. Patient states would like something prescribed before it gets worse. States is coughing, sneezing, and is having sinus pressure. Patient does not want it to get worse and wants a prescription.   Please update patient. Reason for Disposition  [1] HIGH RISK patient (e.g., weak immune system, age > 64 years, obesity with BMI 30 or higher, pregnant, chronic lung disease or other chronic medical condition) AND [2] COVID symptoms (e.g., cough, fever)  (Exceptions: Already seen by PCP and no new or worsening symptoms.)  Answer Assessment - Initial Assessment Questions Patient has been using her prescribed medications Denies any difficulty breathing Would like cough medication and/or medication for covid such as Paxlovid  Patient is advised that if anything worsens to go to the Emergency Room. Patient verbalized understanding.    [facility] Triage - Initial Assessment Questions Chief Complaint (e.g., cough, sob,  wheezing, fever, chills, sweat or additional symptoms) *Go to specific symptom protocol after initial questions. Sneezing, coughing---non productive, runny nose  How long have symptoms been present?   2 days  Have you tested for COVID or Flu? Note: If not, ask patient if a home test can be taken. If so, instruct patient to call back for positive results. Yes  Positive today  MEDICINES:   Have you used any OTC meds to help with symptoms? No If yes, ask What medications? N/A  Have you used your inhalers/maintenance medication? Yes If yes, What medications? Albuterol  Nebulizer--Take 3 mLs (1.25 mg total) by nebulization every 6 (six) hours as needed for wheezing.  Albuterol  Inhaler--INHALE 2 PUFFS BY MOUTH EVERY 4 HOURS AS NEEDED  Trelegy Inhaler--Inhale 1 puff into the lungs daily.   If inhaler, ask How many puffs and how often? Note: Review instructions on medication in the chart. Albuterol  Nebulizer--Take 3 mLs (1.25 mg total) by nebulization every 6 (six) hours as needed for wheezing.  Albuterol  Inhaler--INHALE 2 PUFFS BY MOUTH EVERY 4 HOURS AS NEEDED  Trelegy Inhaler--Inhale 1 puff into the lungs daily.   OXYGEN: Do you wear supplemental oxygen? Yes If yes, How many liters are you supposed to use? Just CPAP at night  Do you monitor your oxygen levels? Yes If yes, What is your reading (oxygen level) today? 97%  What is your usual oxygen saturation reading?  (Note: Pulmonary O2 sats should be 90% or greater) -----      2. COVID-19 EXPOSURE: Was there any known exposure to COVID before the symptoms began? CDC Definition of close contact: within 6 feet (2 meters) for a total of 15 minutes or  more over a 24-hour period.      Unknown exactly---daughter had covid two weeks ago and she was around her 3. ONSET: When did the COVID-19 symptoms start?      2 days ago with sneezing 4. WORST SYMPTOM: What is your worst symptom? (e.g., cough, fever,  shortness of breath, muscle aches)     Cough, generalized malaise 5. COUGH: Do you have a cough? If Yes, ask: How bad is the cough?       yes 6. FEVER: Do you have a fever? If Yes, ask: What is your temperature, how was it measured, and when did it start?     no 7. RESPIRATORY STATUS: Describe your breathing? (e.g., normal; shortness of breath, wheezing, unable to speak)      Patient denies difficulty breathing 8. BETTER-SAME-WORSE: Are you getting better, staying the same or getting worse compared to yesterday?  If getting worse, ask, In what way?     ----- 9. OTHER SYMPTOMS: Do you have any other symptoms?  (e.g., chills, fatigue, headache, loss of smell or taste, muscle pain, sore throat)     Sore throat, runny nose, sinus pressure 10. HIGH RISK DISEASE: Do you have any chronic medical problems? (e.g., asthma, heart or lung disease, weak immune system, obesity, etc.)       Yes  COPD and cardiac history 11. VACCINE: Have you had the COVID-19 vaccine? If Yes, ask: Which one, how many shots, when did you get it?       yes  Answer Assessment - Initial Assessment Questions E2C2 Pulmonary Triage - Initial Assessment Questions Chief Complaint (e.g., cough, sob, wheezing, fever, chills, sweat or additional symptoms) *Go to specific symptom protocol after initial questions. Sneezing, coughing---non productive, runny nose  How long have symptoms been present?   2 days  Have you tested for COVID or Flu? Note: If not, ask patient if a home test can be taken. If so, instruct patient to call back for positive results. Yes  Positive today  MEDICINES:   Have you used any OTC meds to help with symptoms? No If yes, ask What medications? N/A  Have you used your inhalers/maintenance medication? Yes If yes, What medications? Albuterol  Nebulizer--Take 3 mLs (1.25 mg total) by nebulization every 6 (six) hours as needed for wheezing.  Albuterol  Inhaler--INHALE 2 PUFFS  BY MOUTH EVERY 4 HOURS AS NEEDED  Trelegy Inhaler--Inhale 1 puff into the lungs daily.   If inhaler, ask How many puffs and how often? Note: Review instructions on medication in the chart. Albuterol  Nebulizer--Take 3 mLs (1.25 mg total) by nebulization every 6 (six) hours as needed for wheezing.  Albuterol  Inhaler--INHALE 2 PUFFS BY MOUTH EVERY 4 HOURS AS NEEDED  Trelegy Inhaler--Inhale 1 puff into the lungs daily.   OXYGEN: Do you wear supplemental oxygen? Yes If yes, How many liters are you supposed to use? Just CPAP at night  Do you monitor your oxygen levels? Yes If yes, What is your reading (oxygen level) today? 97%  What is your usual oxygen saturation reading?  (Note: Pulmonary O2 sats should be 90% or greater) -----     3. PATTERN Does the difficult breathing come and go, or has it been constant since it started?      ---- 4. SEVERITY: How bad is your breathing? (e.g., mild, moderate, severe)      N/a 5. RECURRENT SYMPTOM: Have you had difficulty breathing before? If Yes, ask: When was the last time? and What happened that time?      ---  6. CARDIAC HISTORY: Do you have any history of heart disease? (e.g., heart attack, angina, bypass surgery, angioplasty)      ---- 7. LUNG HISTORY: Do you have any history of lung disease?  (e.g., pulmonary embolus, asthma, emphysema)      8. CAUSE: What do you think is causing the breathing problem?      ---- 9. OTHER SYMPTOMS: Do you have any other symptoms? (e.g., chest pain, cough, dizziness, fever, runny nose)     Runny nose, sneezing, sinus pressure, sore throat  Protocols used: Breathing Difficulty-A-AH, Coronavirus (COVID-19) Diagnosed or Suspected-A-AH

## 2024-06-04 ENCOUNTER — Other Ambulatory Visit: Payer: Self-pay

## 2024-06-04 MED ORDER — PAXLOVID (300/100) 20 X 150 MG & 10 X 100MG PO TBPK
3.0000 | ORAL_TABLET | Freq: Two times a day (BID) | ORAL | 0 refills | Status: AC
  Filled 2024-06-04: qty 30, 5d supply, fill #0

## 2024-06-24 ENCOUNTER — Other Ambulatory Visit: Payer: Self-pay

## 2024-06-26 ENCOUNTER — Ambulatory Visit: Attending: Internal Medicine | Admitting: Internal Medicine

## 2024-06-26 ENCOUNTER — Ambulatory Visit: Admitting: Internal Medicine

## 2024-06-26 ENCOUNTER — Encounter: Payer: Self-pay | Admitting: Internal Medicine

## 2024-06-26 VITALS — BP 122/68 | HR 81 | Wt 180.0 lb

## 2024-06-26 DIAGNOSIS — I25118 Atherosclerotic heart disease of native coronary artery with other forms of angina pectoris: Secondary | ICD-10-CM | POA: Diagnosis not present

## 2024-06-26 DIAGNOSIS — I10 Essential (primary) hypertension: Secondary | ICD-10-CM

## 2024-06-26 DIAGNOSIS — G720 Drug-induced myopathy: Secondary | ICD-10-CM

## 2024-06-26 DIAGNOSIS — E1169 Type 2 diabetes mellitus with other specified complication: Secondary | ICD-10-CM | POA: Diagnosis not present

## 2024-06-26 DIAGNOSIS — E785 Hyperlipidemia, unspecified: Secondary | ICD-10-CM

## 2024-06-26 DIAGNOSIS — T466X5S Adverse effect of antihyperlipidemic and antiarteriosclerotic drugs, sequela: Secondary | ICD-10-CM

## 2024-06-26 DIAGNOSIS — T466X5A Adverse effect of antihyperlipidemic and antiarteriosclerotic drugs, initial encounter: Secondary | ICD-10-CM

## 2024-06-26 DIAGNOSIS — R079 Chest pain, unspecified: Secondary | ICD-10-CM

## 2024-06-26 NOTE — Progress Notes (Unsigned)
 Cardiology Office Note:  .   Date:  06/27/2024  ID:  Monica Oliver, DOB 04-Oct-1950, MRN [id number] PCP: Monica Therisa BIRCH, MD  [facility] Providers Cardiologist:  Lonni Hanson, MD     History of Present Illness: .   Monica Oliver is a 73 y.o. female with history of single-vessel coronary artery disease involving RPAV status post unsuccessful PCI due to resistant lesion necessitating drug-eluting stent placement to the distal RCA for management of iatrogenic dissection, hypertension, hyperlipidemia, type 2 diabetes mellitus, iron deficiency anemia, COPD, obstructive sleep apnea, shingles, and tobacco abuse, who presents for follow-up of coronary artery disease with stable angina.  She was last seen in the office in August by Mikey Fishman, [place], at which time she complained of continued intermittent chest pain and exertional dyspnea that was complicated by preceding URI.  This is led to a recent ED visit, during which high-sensitivity troponin I was negative.  Subsequent myocardial PET/CT was normal without evidence of ischemia or scar.  Today, Monica Oliver reports that she is feeling better with less chest pain.  In hindsight, she believes that Chantix  (which she has since stopped) may have contributed to some of her chest discomfort.  She had been prescribed isosorbide  mononitrate at her last visit with Cadence Furth, PA, though she never started this medicine as she was concerned about side effects.  We had trialed this medication in the remote past but had to stop it due to associated achiness and generalized fatigue.  She still gets some tightness in the chest and dyspnea when rushing, though this is not any different than how she felt a year or 2 ago.  She has not had any symptoms at rest.  She notes mild lower extremity edema, which is dependent.  She denies palpitations and lightheadedness.  ROS: See HPI  Studies Reviewed: SABRA   EKG Interpretation Date/Time:  Wednesday June 26 2024  16:17:13 EDT Ventricular Rate:  81 PR Interval:  170 QRS Duration:  66 QT Interval:  400 QTC Calculation: 464 R Axis:   39  Text Interpretation: Normal sinus rhythm Normal ECG When compared with ECG of 29-Apr-2024 13:57, QRS axis Shifted right Criteria for Inferior-posterior infarct are no longer Present Confirmed by Roy Snuffer, Lonni [phone]) on 06/27/2024 3:09:31 PM    Myocardial PET/CT (05/07/2024): Normal study without evidence of ischemia or scar.  LVEF 66% at rest and 68% with stress.  Normal global myocardial blood flow reserve.  Risk Assessment/Calculations:             Physical Exam:   VS:  BP 122/68 (BP Location: Left Arm, Patient Position: Sitting, Cuff Size: Normal)   Pulse 81   Wt 180 lb (81.6 kg)   SpO2 98%   BMI 31.89 kg/m    Wt Readings from Last 3 Encounters:  06/26/24 180 lb (81.6 kg)  04/29/24 178 lb 12.8 oz (81.1 kg)  04/08/24 178 lb (80.7 kg)    General:  NAD. Neck: No JVD or HJR. Lungs: Clear to auscultation bilaterally without wheezes or crackles. Heart: Regular rate and rhythm without murmurs, rubs, or gallops. Abdomen: Soft, nontender, nondistended. Extremities: No lower extremity edema.  ASSESSMENT AND PLAN: .    Coronary artery disease with stable angina: Chest pain has returned back to baseline level after discontinuation of Chantix .  In light of this and previous intolerance of isosorbide  mononitrate, we will defer having her filled the prescription for isosorbide  mononitrate provided at Ms. Scherer's last visit and continue her  current antianginal regimen consisting of amlodipine  and metoprolol .  Recent myocardial PET/CT was reassuring without evidence of any ischemia or scar.  EKG is also normal today.  Continue secondary prevention with clopidogrel , ezetimibe , and low-dose rosuvastatin , given intolerance to more potent statin therapy in the past secondary to myalgias.  We will have her return for a CBC, CMP, and lipid panel at her convenience.  I  encouraged [name] to keep working on tobacco cessation, albeit without Chantix  given concern that this may have exacerbated her chest pain.  Hypertension: Blood pressure well-controlled today.  No medication changes at this time.  Hyperlipidemia associated with type 2 diabetes mellitus and statin myopathy: Continue ezetimibe  and low-dose rosuvastatin  given history of myalgias with more aggressive statin therapy.  We will have [name] return at her convenience for a CMP and lipid panel to ensure appropriate response.    Dispo: Return to clinic in 6 months.  Signed, Lonni Hanson, MD

## 2024-06-26 NOTE — Patient Instructions (Signed)
 Medication Instructions:  Your physician recommends the following medication changes.  STOP TAKING: IMDUR    *If you need a refill on your cardiac medications before your next appointment, please call your pharmacy*  Lab Work: Your provider would like for you to return in 1 week to have the following labs drawn: lipid, CMP, CBC.   Please go to [facility] [address] ([facility] [address], [place] [address] You do not need an appointment.  They are open from 8 am- 4:30 pm.  Lunch from 1:00 pm- 2:00 pm You DO need to be fasting.   If you have labs (blood work) drawn today and your tests are completely normal, you will receive your results only by: MyChart Message (if you have MyChart) OR A paper copy in the mail If you have any lab test that is abnormal or we need to change your treatment, we will call you to review the results.  Testing/Procedures: No test ordered today   Follow-Up: At [facility], you and your health needs are our priority.  As part of our continuing mission to provide you with exceptional heart care, our providers are all part of one team.  This team includes your primary Cardiologist (physician) and Advanced Practice Providers or APPs (Physician Assistants and Nurse Practitioners) who all work together to provide you with the care you need, when you need it.  Your next appointment:   6 month(s)  Provider:   You may see Lonni Hanson, MD or one of the following Advanced Practice Providers on your designated Care Team:   Lonni Meager, NP Lesley Maffucci, PA-C Bernardino Bring, PA-C Cadence Catarina, PA-C Tylene Lunch, NP Barnie Hila, NP

## 2024-06-27 ENCOUNTER — Encounter: Payer: Self-pay | Admitting: Internal Medicine

## 2024-07-04 ENCOUNTER — Other Ambulatory Visit: Payer: Self-pay

## 2024-07-05 ENCOUNTER — Other Ambulatory Visit: Payer: Self-pay

## 2024-07-05 ENCOUNTER — Other Ambulatory Visit: Payer: Self-pay | Admitting: Internal Medicine

## 2024-07-07 ENCOUNTER — Other Ambulatory Visit: Payer: Self-pay

## 2024-07-08 ENCOUNTER — Other Ambulatory Visit: Payer: Self-pay

## 2024-07-08 MED FILL — Fluticasone-Umeclidinium-Vilanterol AEPB 200-62.5-25 MCG/ACT: RESPIRATORY_TRACT | 30 days supply | Qty: 60 | Fill #0 | Status: AC

## 2024-07-11 ENCOUNTER — Other Ambulatory Visit: Payer: Self-pay

## 2024-07-11 MED ORDER — GABAPENTIN 300 MG PO CAPS
300.0000 mg | ORAL_CAPSULE | Freq: Every evening | ORAL | 1 refills | Status: AC
  Filled 2024-07-11: qty 30, 30d supply, fill #0

## 2024-07-11 MED ORDER — TRAMADOL HCL 50 MG PO TABS
50.0000 mg | ORAL_TABLET | Freq: Every morning | ORAL | 1 refills | Status: AC
  Filled 2024-07-11: qty 20, 20d supply, fill #0
  Filled 2024-08-15: qty 7, 7d supply, fill #1
  Filled 2024-10-18: qty 7, 7d supply, fill #2

## 2024-07-30 ENCOUNTER — Other Ambulatory Visit: Payer: Self-pay

## 2024-07-30 MED ORDER — FLUZONE HIGH-DOSE 0.5 ML IM SUSY
0.5000 mL | PREFILLED_SYRINGE | Freq: Once | INTRAMUSCULAR | 0 refills | Status: AC
  Filled 2024-07-30: qty 0.5, 1d supply, fill #0

## 2024-07-31 ENCOUNTER — Other Ambulatory Visit: Payer: Self-pay

## 2024-08-06 ENCOUNTER — Ambulatory Visit: Admitting: Internal Medicine

## 2024-08-12 ENCOUNTER — Other Ambulatory Visit: Payer: Self-pay

## 2024-08-12 ENCOUNTER — Other Ambulatory Visit: Payer: Self-pay | Admitting: Internal Medicine

## 2024-08-12 DIAGNOSIS — R0989 Other specified symptoms and signs involving the circulatory and respiratory systems: Secondary | ICD-10-CM

## 2024-08-12 MED ORDER — FLUTICASONE PROPIONATE 50 MCG/ACT NA SUSP
1.0000 | Freq: Every day | NASAL | 12 refills | Status: AC | PRN
  Filled 2024-08-12: qty 16, 30d supply, fill #0

## 2024-08-15 ENCOUNTER — Other Ambulatory Visit: Payer: Self-pay

## 2024-08-16 ENCOUNTER — Other Ambulatory Visit: Payer: Self-pay

## 2024-08-20 ENCOUNTER — Other Ambulatory Visit: Payer: Self-pay

## 2024-08-23 ENCOUNTER — Inpatient Hospital Stay: Admission: RE | Admit: 2024-08-23 | Source: Ambulatory Visit

## 2024-08-26 ENCOUNTER — Ambulatory Visit
Admission: RE | Admit: 2024-08-26 | Discharge: 2024-08-26 | Disposition: A | Source: Ambulatory Visit | Attending: Acute Care | Admitting: Acute Care

## 2024-08-26 DIAGNOSIS — Z87891 Personal history of nicotine dependence: Secondary | ICD-10-CM | POA: Diagnosis present

## 2024-08-26 DIAGNOSIS — Z122 Encounter for screening for malignant neoplasm of respiratory organs: Secondary | ICD-10-CM | POA: Insufficient documentation

## 2024-08-26 DIAGNOSIS — F17210 Nicotine dependence, cigarettes, uncomplicated: Secondary | ICD-10-CM | POA: Insufficient documentation

## 2024-08-30 ENCOUNTER — Other Ambulatory Visit: Payer: Self-pay

## 2024-08-30 DIAGNOSIS — Z122 Encounter for screening for malignant neoplasm of respiratory organs: Secondary | ICD-10-CM

## 2024-08-30 DIAGNOSIS — Z87891 Personal history of nicotine dependence: Secondary | ICD-10-CM

## 2024-08-30 DIAGNOSIS — F17210 Nicotine dependence, cigarettes, uncomplicated: Secondary | ICD-10-CM

## 2024-09-03 ENCOUNTER — Other Ambulatory Visit: Payer: Self-pay

## 2024-09-03 MED ORDER — JANUVIA 25 MG PO TABS
25.0000 mg | ORAL_TABLET | ORAL | 0 refills | Status: AC
  Filled 2024-09-03 – 2024-10-02 (×3): qty 113, 60d supply, fill #0
  Filled 2024-10-02: qty 7, 7d supply, fill #0

## 2024-09-05 ENCOUNTER — Other Ambulatory Visit: Payer: Self-pay

## 2024-09-05 MED ORDER — GABAPENTIN 300 MG PO CAPS
300.0000 mg | ORAL_CAPSULE | Freq: Every evening | ORAL | 2 refills | Status: AC
  Filled 2024-09-05: qty 30, 30d supply, fill #0

## 2024-09-05 MED ORDER — TRAMADOL HCL 50 MG PO TABS
50.0000 mg | ORAL_TABLET | Freq: Every morning | ORAL | 1 refills | Status: AC
  Filled 2024-09-05: qty 30, 30d supply, fill #0

## 2024-09-06 ENCOUNTER — Other Ambulatory Visit: Payer: Self-pay

## 2024-09-13 ENCOUNTER — Other Ambulatory Visit: Payer: Self-pay

## 2024-09-24 ENCOUNTER — Other Ambulatory Visit: Payer: Self-pay

## 2024-09-25 ENCOUNTER — Other Ambulatory Visit: Payer: Self-pay

## 2024-10-02 ENCOUNTER — Other Ambulatory Visit: Payer: Self-pay

## 2024-10-03 ENCOUNTER — Other Ambulatory Visit: Payer: Self-pay

## 2024-10-03 MED ORDER — JANUVIA 25 MG PO TABS
ORAL_TABLET | ORAL | 0 refills | Status: AC
  Filled 2024-10-03 – 2024-10-08 (×2): qty 113, 60d supply, fill #0

## 2024-10-04 ENCOUNTER — Other Ambulatory Visit: Payer: Self-pay

## 2024-10-07 ENCOUNTER — Other Ambulatory Visit: Payer: Self-pay

## 2024-10-08 ENCOUNTER — Other Ambulatory Visit: Payer: Self-pay

## 2024-10-11 ENCOUNTER — Other Ambulatory Visit: Payer: Self-pay

## 2024-10-14 ENCOUNTER — Other Ambulatory Visit: Payer: Self-pay

## 2024-10-17 ENCOUNTER — Other Ambulatory Visit: Payer: Self-pay | Admitting: Internal Medicine

## 2024-10-17 ENCOUNTER — Ambulatory Visit: Admitting: Internal Medicine

## 2024-10-17 ENCOUNTER — Encounter: Payer: Self-pay | Admitting: Internal Medicine

## 2024-10-17 VITALS — BP 120/80 | HR 76 | Temp 98.3°F | Ht 63.0 in | Wt 177.6 lb

## 2024-10-17 DIAGNOSIS — Z6831 Body mass index (BMI) 31.0-31.9, adult: Secondary | ICD-10-CM

## 2024-10-17 DIAGNOSIS — J449 Chronic obstructive pulmonary disease, unspecified: Secondary | ICD-10-CM

## 2024-10-17 DIAGNOSIS — G4733 Obstructive sleep apnea (adult) (pediatric): Secondary | ICD-10-CM

## 2024-10-17 DIAGNOSIS — E669 Obesity, unspecified: Secondary | ICD-10-CM

## 2024-10-17 DIAGNOSIS — R918 Other nonspecific abnormal finding of lung field: Secondary | ICD-10-CM | POA: Diagnosis not present

## 2024-10-17 DIAGNOSIS — F17210 Nicotine dependence, cigarettes, uncomplicated: Secondary | ICD-10-CM | POA: Diagnosis not present

## 2024-10-17 DIAGNOSIS — Z720 Tobacco use: Secondary | ICD-10-CM

## 2024-10-17 NOTE — Addendum Note (Signed)
 Addended byBETHA CELLAR, DAVID on: 10/17/2024 12:26 PM   Modules accepted: Level of Service

## 2024-10-17 NOTE — Patient Instructions (Addendum)
 Please stop Smoking!  Excellent Job A+ GOLD STAR!!  Continue CPAP as prescribed  Patient Instructions Continue to use CPAP every night, minimum of 4-6 hours a night.  Change equipment every 30 days or as directed by DME.  Wash your tubing with warm soap and water daily, hang to dry. Wash humidifier portion weekly. Use bottled, distilled water and change daily   Be aware of reduced alertness and do not drive or operate heavy machinery if experiencing this or drowsiness.  Exercise encouraged, as tolerated. Encouraged proper weight management.  Important to get eight or more hours of sleep  Limiting the use of the computer and television before bedtime.  Decrease naps during the day, so night time sleep will become enhanced.  Limit caffeine, and sleep deprivation.    Avoid Allergens and Irritants Avoid secondhand smoke Avoid SICK contacts Recommend  Masking  when appropriate Recommend Keep up-to-date with vaccinations  Continue Trelegy as prescribed Rinse mouth after every use Follow up CT chest in 1 year

## 2024-10-17 NOTE — Progress Notes (Signed)
 " [facility] [place] [facility] Consultation       PROBLEMS:  Mild COPD Smoker  DATA: LDCT 06/07/16: normal PFTs 06/13/16: mild obstruction with borderline significant improvement after bronchodilator. Normal TLC. Mildly reduced DLCO  INTERVAL HISTORY: Previously seen by VM and diagnosed with mild COPD.   DATA:   CXR 09/24/16: vague left upper lobe opacity   CXR 11/10/16 : resolution of LUL opacity CT chest 07/16/2018-and discrete bilateral pulmonary nodules not visualized but reported Mild lymphadenopathy anterior tracheal space  CT chest 07/2023 No suspicious focal pulmonary nodules or infiltrates.  Stable subtle peribronchial nodularity within the lung apices bilaterally possibly representing changes of RB-ILD/smoking related lung disease.  CT scan results reviewed with patient in detail  CT CHEST 08/2024  No pleural fluid. Mild centrilobular emphysema. No change in pulmonary nodules of maximally 2.2 mm.     CC  Follow-up assessment for COPD Follow-up assessment for OSA Follow-up assessment abnormal CT chest pulmonary nodules  HPI Assessment of OSA Previous AHI 35 Discussed sleep data and reviewed with patient.  Encouraged proper weight management.  Discussed sleep hygiene Patient uses and benefits from therapy Using CPAP nightly and with naps Settings are comfortable and is sleeping well. CPAP prescription CPAP 8 AHI reduced to 0.5    Assessment of COPD  moderate COPD  Current FEV1 is 40% predicted No exacerbation at this time No evidence of heart failure at this time No evidence or signs of infection at this time No respiratory distress No fevers, chills, nausea, vomiting, diarrhea No evidence of lower extremity edema No evidence hemoptysis    Assessment of bilateral lung nodules Previous CT scan October 2021 showed bilateral nodules CT chest 07/2022 did not show any new nodules Patient will need ongoing CT chest due to ongoing tobacco  abuse  CT chest 08/2024 Images reviewed independently with patient No significant abnormalities noted No evidence of nodules or lung masses No pneumonia pneumothorax or effusions Recommend follow-up CT lung cancer screening program   BP 120/80   Pulse 76   Temp 98.3 F (36.8 C)   Ht 5' 3 (1.6 m)   Wt 177 lb 9.6 oz (80.6 kg)   SpO2 98%   BMI 31.46 kg/m      Physical Examination:  General Appearance: No distress  EYES EOM intact.   NECK Supple, No JVD Pulmonary: normal breath sounds, No wheezing.  CardiovascularNormal S1,S2.  No m/r/g.   Ext pulses intact, cap refill intact  ALL OTHER ROS ARE NEGATIVE     ASSESSMENT AND PLAN  74 year old [place] African-American female seen today for follow-up assessment for moderate severe COPD FEV1 40% predicted on pulmonary function test with underlying severe sleep apnea AHI 35 in the setting of ongoing tobacco abuse obesity and deconditioned state    Assessment of COPD Moderate to severe COPD  Current FEV1 is 40% predicted continue inhalers as prescribed Trelegy to be continued Rinse mouth after every use Continue albuterol  as needed   Assessment of OSA Previous AHI 35 Patient uses and benefits from therapy Using CPAP nightly and with naps Pressure setting is comfortable and is sleeping well. Current pressure CPAP 8 will change to 10 AHI well-controlled down to 0.5  Patient Instructions  Continue to use CPAP every night, minimum of 4-6 hours a night.  Change equipment every 30 days or as directed by DME.  Wash your tubing with warm soap and water daily, hang to dry. Wash humidifier portion weekly. Use bottled, distilled water and change daily  Obesity -recommend significant weight loss -recommend changing diet  Deconditioned state -Recommend increased daily activity and exercise   Lung cancer screening program CT chest 08/2024 reviewed in detail No significant findings at this time Previous CT scans  reviewed Findings may be consistent with RB ILD smoking-related disease Follow-up lung cancer screening program  Smoking Assessment and Cessation Counseling Upon further questioning, Patient smokes 1/2 ppd I have advised patient to quit/stop smoking as soon as possible due to high risk for multiple medical problems Patient is willing to quit smoking  I have advised patient that we can assist and have options of Nicotine replacement therapy. I also advised patient on behavioral therapy and can provide oral medication therapy in conjunction with the other therapies Follow up next Office visit  for assessment of smoking cessation Smoking cessation counseling advised for 4 minutes   MEDICATION ADJUSTMENTS/LABS AND TESTS ORDERED: Continue to use Trelegy as prescribed and as needed Rinse mouth after every use Follow up Lung Cancer screening program Smoking cessation advised   CURRENT MEDICATIONS REVIEWED AT LENGTH WITH PATIENT TODAY   Patient  satisfied with Plan of action and management. All questions answered   Follow up 6 months   I spent a total of 43 minutes dedicated to the care of this patient on the date of this encounter to include pre-visit review of records, face-to-face time with the patient discussing conditions above, post visit ordering of testing, clinical documentation with the electronic health record, making appropriate referrals as documented, and communicating necessary information to the patient's healthcare team.    The Patient requires high complexity decision making for assessment and support, frequent evaluation and titration of therapies, application of advanced monitoring technologies and extensive interpretation of multiple databases.  Patient satisfied with Plan of action and management. All questions answered    Nickolas Alm Cellar, M.D.  [facility]  Medical Director [facility] [place]          "

## 2024-10-18 ENCOUNTER — Other Ambulatory Visit: Payer: Self-pay

## 2024-10-18 MED ORDER — AMLODIPINE BESYLATE 10 MG PO TABS
10.0000 mg | ORAL_TABLET | Freq: Every day | ORAL | 3 refills | Status: AC
  Filled 2024-10-18: qty 90, 90d supply, fill #0

## 2024-10-18 MED ORDER — LISINOPRIL 20 MG PO TABS
20.0000 mg | ORAL_TABLET | Freq: Every day | ORAL | 3 refills | Status: AC
  Filled 2024-10-18: qty 90, 90d supply, fill #0
